# Patient Record
Sex: Female | Born: 1940 | Race: Black or African American | Hispanic: No | Marital: Married | Smoking: Former smoker
Health system: Southern US, Community
[De-identification: ages and names within clinical notes are randomized; demographics above are authoritative.]

## PROBLEM LIST (undated history)

## (undated) DIAGNOSIS — Z9889 Other specified postprocedural states: Secondary | ICD-10-CM

## (undated) DIAGNOSIS — I519 Heart disease, unspecified: Secondary | ICD-10-CM

## (undated) DIAGNOSIS — I2510 Atherosclerotic heart disease of native coronary artery without angina pectoris: Secondary | ICD-10-CM

## (undated) DIAGNOSIS — M545 Low back pain, unspecified: Secondary | ICD-10-CM

## (undated) DIAGNOSIS — J189 Pneumonia, unspecified organism: Secondary | ICD-10-CM

## (undated) DIAGNOSIS — E7800 Pure hypercholesterolemia, unspecified: Secondary | ICD-10-CM

## (undated) DIAGNOSIS — I10 Essential (primary) hypertension: Secondary | ICD-10-CM

## (undated) DIAGNOSIS — E119 Type 2 diabetes mellitus without complications: Secondary | ICD-10-CM

## (undated) DIAGNOSIS — G709 Myoneural disorder, unspecified: Secondary | ICD-10-CM

## (undated) DIAGNOSIS — R112 Nausea with vomiting, unspecified: Secondary | ICD-10-CM

## (undated) DIAGNOSIS — G8929 Other chronic pain: Secondary | ICD-10-CM

## (undated) DIAGNOSIS — Z8489 Family history of other specified conditions: Secondary | ICD-10-CM

## (undated) DIAGNOSIS — K219 Gastro-esophageal reflux disease without esophagitis: Secondary | ICD-10-CM

## (undated) DIAGNOSIS — M1990 Unspecified osteoarthritis, unspecified site: Secondary | ICD-10-CM

## (undated) DIAGNOSIS — E785 Hyperlipidemia, unspecified: Secondary | ICD-10-CM

## (undated) DIAGNOSIS — I959 Hypotension, unspecified: Secondary | ICD-10-CM

## (undated) DIAGNOSIS — J302 Other seasonal allergic rhinitis: Secondary | ICD-10-CM

## (undated) DIAGNOSIS — R55 Syncope and collapse: Secondary | ICD-10-CM

## (undated) HISTORY — PX: HYSTERECTOMY: SHX81

## (undated) HISTORY — PX: CORONARY ARTERY BYPASS: SHX3487

## (undated) HISTORY — DX: Hyperlipidemia, unspecified: E78.5

## (undated) HISTORY — DX: Essential (primary) hypertension: I10

## (undated) HISTORY — DX: Gastro-esophageal reflux disease without esophagitis: K21.9

## (undated) HISTORY — PX: BREAST BIOPSY: SHX20

## (undated) HISTORY — DX: Type 2 diabetes mellitus without complications: E11.9

## (undated) HISTORY — DX: Other seasonal allergic rhinitis: J30.2

## (undated) HISTORY — PX: APPENDECTOMY: SHX54

## (undated) HISTORY — PX: JOINT REPLACEMENT: SHX530

## (undated) HISTORY — DX: Heart disease, unspecified: I51.9

## (undated) HISTORY — PX: SHOULDER OPEN ROTATOR CUFF REPAIR: SHX2407

## (undated) HISTORY — PX: CHOLECYSTECTOMY: SHX55

## (undated) HISTORY — PX: TONSILLECTOMY: SUR1361

## (undated) HISTORY — PX: DILATION AND CURETTAGE OF UTERUS: SHX78

## (undated) HISTORY — PX: BACK SURGERY: SHX140

## (undated) HISTORY — PX: VAGINAL HYSTERECTOMY: SUR661

## (undated) HISTORY — PX: CATARACT EXTRACTION W/ INTRAOCULAR LENS  IMPLANT, BILATERAL: SHX1307

---

## 1995-08-23 ENCOUNTER — Inpatient Hospital Stay
Admission: AD | Admit: 1995-08-23 | Disposition: A | Discharge status: Home / Self Care | Payer: Self-pay | Source: Ambulatory Visit | Admitting: Internal Medicine

## 2000-05-02 HISTORY — PX: CARDIAC CATHETERIZATION: SHX172

## 2000-05-02 HISTORY — PX: CORONARY ARTERY BYPASS GRAFT: SHX141

## 2001-02-19 ENCOUNTER — Inpatient Hospital Stay
Admission: EM | Admit: 2001-02-19 | Disposition: A | Discharge status: Home / Self Care | Payer: Self-pay | Source: Emergency Department | Admitting: Internal Medicine

## 2001-08-24 ENCOUNTER — Inpatient Hospital Stay
Admission: EM | Admit: 2001-08-24 | Disposition: A | Discharge status: Home / Self Care | Payer: Self-pay | Source: Emergency Department | Admitting: Internal Medicine

## 2001-08-24 ENCOUNTER — Ambulatory Visit
Admit: 2001-08-24 | Disposition: A | Discharge status: Home / Self Care | Payer: Self-pay | Source: Ambulatory Visit | Admitting: Internal Medicine

## 2001-09-14 ENCOUNTER — Ambulatory Visit: Admit: 2001-09-14 | Disposition: A | Discharge status: Home / Self Care | Payer: Self-pay | Source: Ambulatory Visit

## 2001-11-20 ENCOUNTER — Ambulatory Visit: Admit: 2001-11-20 | Disposition: A | Discharge status: Home / Self Care | Payer: Self-pay | Source: Ambulatory Visit

## 2004-05-02 HISTORY — PX: CORONARY ANGIOPLASTY WITH STENT PLACEMENT: SHX49

## 2012-04-23 NOTE — Discharge Summary (Unsigned)
Vanessa Kelly, Vanessa Kelly                 #[id number]            ADMITTED:  08/24/2001               DISCHARGED:  09/01/2001            ATTENDING PHYSICIAN:                Rocco Pauls, MD            DICTATED BY:                        Lovell Sheehan, NP            PROCEDURE:  On 08/28/2001, coronary artery bypass grafting times three with      left internal mammary artery to the  LAD,  saphenous  vein  graft to ramus,      saphenous vein graft to right coronary artery.            HISTORY OF PRESENT ILLNESS:  Vanessa Kelly  is  a  71 year old  female with a      past medical history significant for coronary artery disease, hypertension,      hypercholesterolemia, and non-insulin  dependent diabetes who had developed      exertional  chest burning for a period  of  about  three  weeks.   She  was      admitted to the emergency room with unstable angina and underwent a cardiac      catheterization which revealed severe  stenotic lesion in the midportion of      a very large right coronary artery with a severe lesion in the mid-left LAD      as well.  Left ventricular function was well preserved and she was referred      for surgical revascularization.            HOSPITAL  COURSE:   On 08/24/2001 Mrs.  Kelly  underwent  coronary  artery      bypass grafting.  She tolerated the procedure  well  and was transferred to      the cardiovascular ICU on nitroglycerin  and  low-dose  dopamine infusions.      The  following  day  she  was  extubated,  off  vasoactive  infusions,  and      transferred to the telemetry floor.   She  remained  in  sinus  rhythm with      stable  vital  signs.   Her  hematocrit   was  stable  at  25.7.   She  was      asymptomatic with activity and was Vanessa Kelly  transfused.   Her  white blood cell      count was 11.3 and platelets were 160,000.   Her  postoperative  x-ray  was      clear.  Chest tubes and pacer wires  were  discontinued  without  incident.      She did require Toradol for pain associated  with chest tubes; however, once      removed her pain was well controlled.   She  was ambulating in the hall and      tolerating  a  cardiac  diet  and  discharged   to   home   on  the  fourth      postoperative day with instructions  to  follow  up  in the cardiac surgery      clinic  in  two weeks.  She was also  instructed  to  follow  up  with  Dr.      Lorel Monaco in two to three weeks.            DISCHARGE MEDICATIONS:  Aspirin 325 mg  p.o.  daily, amiodarone 200 mg p.o.      daily times 10 days, Glucophage 500 mg p.o. b.i.d., Maxzide one-half tablet      daily, Darvocet-N 100 one to two tablets q.4 h. p.r.n., and Lopressor 25 mg      p.o. b.i.d.            Patient  education  was  begun on post-op  day  three  in  accordance  with      standards set by the CVT service and continued daily until discharge.  This      education   included  input  from  dietary   and   cardiac   rehabilitation      consultants.  A list of instructions  was  given to the patient at the time      of  discharge  which  emphasized permissible  activities,  diet,  discharge      medications and emergency contact methods.  The patient was judged by me to      be stable at time of discharge.  A copy  of  these instructions is detailed      in the patient's hospital record.                  DISCHARGE DIAGNOSES      1.   Coronary artery disease status post coronary artery bypass grafting.      2.   Hypertension.      3.   Hypercholesterolemia.      4.   Non-insulin dependent diabetes.                                                                  ______________________________________                              __________                              Rocco Pauls, MD                              Date            [id number]      D: 09/01/2001  4:58 P      T: 09/04/2001  4:23 P      J: [id number]      N: [id number]      CC: Rocco Pauls, MD         Ancil Boozer, MD

## 2012-04-23 NOTE — Op Note (Unsigned)
DATE OF CATHETERIZATION:            08/24/2001            CARDIOLOGIST:                       Ancil Boozer, MD            CATHETERIZATION NUMBER:            PRECATHETERIZATION DIAGNOSIS:            POSTCATHETERIZATION DIAGNOSIS:            TITLE OF PROCEDURE      1.   LEFT HEART CATHETERIZATION.      2.   CINE LEFT VENTRICULOGRAPHY.      3.   SELECTIVE CORONARY ARTERIOGRAPHY.            INDICATIONS:  The patient is a 71 year old  diabetic female with a 2-3 week      history of class III exertional angina.  An outpatient exercise stress test      performed earlier today was notable for reproduction of chest pain at a low      work load along with ST elevation in  the  inferior  leads,  both  of which      resolved with sublingual nitroglycerin.  She is now brought to the hospital      for catheterization and possible intervention.            DESCRIPTION  OF  PROCEDURE:   The  cardiac   catheterization  and  possible      coronary interventional procedure were  reviewed  with  the patient and her      husband prior to the procedure with  special  attention  to  the  risks  of      death, MI, CVA, bleeding, and the like.   The patient understood and agreed      to proceed as planned.            At this point the patient was brought in the lab where she was premedicated      with 2 mg of IV Versed and 50 mcg of  IV  fentanyl.   The  patient's  right      groin  was  prepped  and  draped  in  the   usual  sterile  manner.   After      infiltration  of this area with Xylocaine  the  right  femoral  artery  was      cannulated  into  which was placed a  6-French  sheath.   A  6-French  left      Judkins 4-inch catheter was then introduced through the sheath and advanced      under  fluoroscopic  guidance  into  the   left  coronary  where  selective      angiography of the left coronary was  performed  using hand-held injections      of contrast.  This catheter was then  removed  and exchanged for a 6-French      3-D  right coronary catheter which was used in like fashion to cannulate the      right coronary and selective angiography  performed  in  a  similar manner.      This catheter was then removed and exchanged  for a  6-French angled pigtail      catheter which was introduced into the  left  ventricle.   Left ventricular      pressure recordings were taken followed  by a cine left ventriculogram done      in the RAO position using contrast infused  with  a  power  injector.  Left      heart pullback was then done.            This catheter was then removed from the patient, the sheath was pulled, and      hemostasis  achieved  in  the  right groin  using  VasoSeal.   The  patient      tolerated the procedure well and no complications were noted.            RESULTS            1.   FLUOROSCOPY:  No significant calcifications were noted.            2.   SELECTIVE CORONARY ARTERIOGRAPHY           a.   Left Main - The left main coronary artery is normal.           b.   Left Anterior Descending - The left anterior descending artery is      a medium size vessel which is noted to  terminate at the apex of the heart.      This vessel is noted to give off a significant reasonably focal stenosis in      its proximal one-third, with this lesion  occurring  at  the takeoff of the      second major septal perforator branch.   The  lesion  appears approximately      80% in severity.  The remainder of the  LAD  is free of significant disease      as is a major diagonal branch which emanates just prior to the lesion.           c.   Left Circumflex - The left  circumflex  artery  is  a nondominant      vessel comprised of one major A-V groove  branch,  which  gives off several      modest marginals and then terminates into  one major marginal vessel.  This      system appears free of significant disease.           d.   Ramus Intermedius - A ramus intermedius branch of medium to large      is noted, which has the suggestion of significant  ostial narrowing.           e.   Right Coronary Artery - The  right  coronary  artery  is  a large      caliber dominant vessel.  It is noted  to  harbor a focal 99% lesion in its      proximal  to mid portion.  The vessel  terminates  distally  into  a  large      normal PDA and posterior LV branch.            3.    LEFT  VENTRICULOGRAPHY:   Left   ventricular   size  appears  normal.      Systolic  function  is  well  maintained   with  no  regional  wall  motion      abnormalities noted.  The calculated global ejection fraction was normal at      67%.  No mitral regurgitation was appreciated.   The  proximal  aortic root      appears of relatively normal caliber.   There  is the suggestion of minimal      dilatation of the proximal root.            4.   HEMODYNAMICS:  This data is noted  on  a  separate sheet.  Of note was      the lack of any significant gradient across the aortic valve.            CONCLUSIONS      1.    Three-vessel  coronary  artery   disease  with  significant  stenosis      involving the proximal LAD, what appears  to be a significant ostial lesion      involving a medium size ramus vessel, and a critical stenosis involving the      large dominant right coronary artery.      2.   Normal left ventricular systolic function.            DISCUSSION:  Vanessa Kelly is a 71 year old  diabetic  female  who  presented      with a 2-3 week history of class III  angina  confirmed  by a very abnormal      exercise stress test showing inferior  ST elevation and chest pain at a low      work load.  Catheterization today disclosed a critical lesion involving the      large dominant right coronary artery,  but  the  patient  appeared  to have      incidental  although  significant  lesions  involving  her  LAD  and  ramus      intermedius branch.  Given the multivessel  nature  of  her  disease in the      setting  of  a  diabetic patient, it  would  appear  that  full  mechanical      revascularization with  coronary bypass  surgery would be her best long term      solution.  The option of multivessel intervention  to the right and LAD was      mentioned  to the patient, but it was  mentioned  that  statistically  this      would  put  her  at somewhat increased  risk  for  early  recurrent  events      necessitating repeat intervention in  the  near  term  as opposed to bypass      surgery.  She, therefore, opted for the latter approach.                  The patient will be admitted to the hospital  and in the interim stabilized      on medical therapy to include aspirin,  Lovenox, nitrates, beta blocker and      ACE inhibitor therapy.  _____________________________________                              _________                              Ancil Boozer, MD                              Date            [id number]      D: 08/24/2001  4:53 P      T: 08/26/2001  4:53 P      J: [id number]      N: [id number]      CC: Ancil Boozer, MD      Dr. Ivan Anchors M.D., [facility], [address],   [place], [place] [address]        [facility] , [facility]

## 2012-04-23 NOTE — Op Note (Unsigned)
DATE OF SURGERY:                    08/28/2001            SURGEON:                            Rocco Pauls, MD            ASSISTANT(S):                       Damita Lack, PA            DICTATED BY:                        Rocco Pauls, MD                  PREOPERATIVE  DIAGNOSIS:UNSTABLE  ANGINA   PECTORIS   AND  CORONARY  ARTERY                                          DISEASE.            POSTOPERATIVE DIAGNOSIS:            UNSTABLE  ANGINA  PECTORIS AND CORONARY                                          ARTERY DISEASE.            PROCEDURE PERFORMED:                 CORONARY  REVASCULARIZATION  TO  THREE                                          VESSELS USING THE LEFT INTERNAL MAMMARY                                          ARTERY TO THE LEFT ANTERIOR  DESCENDING                                          AND REVERSE SAPHENOUS  VEIN  GRAFTS  TO                                          THE RAMUS INTERMEDIUS AND TO THE DISTAL                                          RIGHT CORONARY ARTERY.            INDICATIONS AND FINDINGS:This 71 year old  lady  presented  with  a  recent      rapidly progressing angina pectoris and a  strongly positive treadmill test.      She  was  admitted through the emergency  room  with  unstable  angina  and      underwent a cardiac catheterization  on  August 24, 2001  by  Dr. Lorel Monaco      demonstrating a severe stenotic lesion  in  the mid portion of a very large      right coronary artery.  She also had  a  severe  lesion  in  the  mid  left      anterior descending beyond a moderate  sized  diagonal  and  a  significant      stenosis in the origin of the ramus intermedius.   The  circumflex was free      of  significant  disease.   The  left ventricular  contractility  was  well      preserved.  Surgical revascularization  was recommended.  The patient had a      history of noninsulin dependent diabetes and hypercholesterolemia.            At the time of exploration, the heart   was moderately hypertrophied but had      good  systolic  function with no evidence  of  previous  scars.   The  left      anterior descending was 2.5 mm in diameter.   The  ramus  was  2 mm and the      distal right coronary artery was 3.5  mm  in  diameter.   The  rest  of the      exploration was unremarkable.            DESCRIPTION OF PROCEDURE:           The procedure was performed under total      cardiopulmonary  bypass with mild hypothermia  to  32  degrees  centigrade.      Myocardial   preservation   was   by  cold   blood   cardioplegia   infused      intermittently  in  an antegrade and  retrograde  fashion  in  addition  to      topical hypothermia.            The  patient  was  in  the  supine  position   under  general  endotracheal      anesthesia.  The chest and groins and  legs  were  prepped  and draped in a      sterile manner.  A midline sternotomy was performed and the mediastinum was      opened.  The left internal mammary artery  was dissected out throughout its      length using the electrocautery and  microclips.   A  segment  of saphenous      vein was harvested from the left leg  to  be  used  for  the bypasses.  The      patient  was  fully  heparinized.   Arterial  and  venous  cannulation  was      accomplished   to  the  ascending  aorta   and   the   right   atrium   and      cardiopulmonary  bypass  was instituted.   The  patient's  temperature  was      brought down to 32 degrees centigrade.   The  aorta  was  cross-clamped and      cardioplegia was infused.  The first  vein  graft  was  anastomosed  to the      distal right coronary artery as an end-to-side anastomosis with running 7-0      Prolene.  The second saphenous vein  graft  was  anastomosed  to  the ramus      intermedius as an end-to-side anastomosis  with  running  7-0 Prolene.  The      left internal mammary artery was then anastomosed to the mid portion of the      left anterior descending as an end-to-side   anastomosis  with  running  8-0      Prolene.  Rewarming was begun.  A dose  of warm blood cardioplegia enriched      with aspartate and glutamate was infused  followed by release of the aortic      cross-clamp and reperfusion of the grafts from the pump.  Cardioversion was      completed without difficulty.  Air was  carefully  evacuated  from the left      side  of  the  heart  and  then,  as  rewarming  progressed,  the  proximal      anastomoses were performed to the side  of  the  ascending  aorta  using  a      partial  occlusion clamp and a running  stitch  of  5-0  Prolene  for  each      anastomosis.  When rewarming was completed,  cardiac contractility appeared      satisfactory.  The patient was weaned off of cardiopulmonary bypass without      difficulty.  The venous and arterial  lines  were  removed  and the heparin      effect reversed using protamine intravenously.   Two  right  atrial and two      right ventricular pacing wires were placed  on  for  temporary pacing.  Two      anterior mediastinal tubes and one left  pleural  tube  were  placed in for      drainage.  The hemostasis was completed  to  a  satisfactory  degree.   The      sponge and needle counts were correct.   The  chest was closed in a routine      fashion using #5 wires on the sternum,  #0  Vicryl  on  the  linea alba and      presternal layers and 4-0 Vicryl on the  subcuticular  layer.   The patient      tolerated the procedure well and was transferred to the Intensive Care Unit      in satisfactory condition.                                                __________________________________________                              __________  Rocco Pauls, MD                              Date            BFA/kg      D: 08/28/2001  3:55 P      T: 08/29/2001  7:08 A      J: [id number]      N: [id number]      CC: Rocco Pauls, MD         Ancil Boozer, MD

## 2012-05-02 HISTORY — PX: TOTAL KNEE ARTHROPLASTY: SHX125

## 2015-10-12 ENCOUNTER — Ambulatory Visit (INDEPENDENT_AMBULATORY_CARE_PROVIDER_SITE_OTHER): Payer: Medicare Other | Admitting: Neurology

## 2015-10-12 ENCOUNTER — Encounter: Payer: Self-pay | Admitting: Neurology

## 2015-10-12 VITALS — BP 142/70 | HR 72 | Resp 16 | Ht 69.0 in | Wt 185.0 lb

## 2015-10-12 DIAGNOSIS — G250 Essential tremor: Secondary | ICD-10-CM | POA: Diagnosis not present

## 2015-10-12 DIAGNOSIS — R296 Repeated falls: Secondary | ICD-10-CM

## 2015-10-12 DIAGNOSIS — R29818 Other symptoms and signs involving the nervous system: Secondary | ICD-10-CM

## 2015-10-12 DIAGNOSIS — R2689 Other abnormalities of gait and mobility: Secondary | ICD-10-CM

## 2015-10-12 DIAGNOSIS — E0842 Diabetes mellitus due to underlying condition with diabetic polyneuropathy: Secondary | ICD-10-CM

## 2015-10-12 NOTE — Progress Notes (Signed)
Subjective:    Patient ID: Cynthia Wood is a 75 y.o. female.  HPI      Dear Dr. Ollen BowlHarkins,  I saw your patient, Cynthia Wood, upon your kind request in my neurologic clinic today for initial consultation of her balance issues with an underlying history of lumbar radiculopathy. The patient is accompanied by her husband today. As you know, Cynthia Wood is a 75 year old right-handed woman with an underlying medical history of tremor, allergies, DM, HTN, reflux, 3 vessel CABG in 2001, who reports low back pain. I reviewed your office note from 09/17/2015. In April 2017 she underwent L4-5 epidural steroid injection. She reported improvement for a few days. However, she had a fall as she tripped on a curb.  She had another epidural steroid injection. She had a prior lumbar spine MRI. ResultsWere requested from your office and I reviewed the test results: Exam date 08/13/2015: Impression: At L5-S1 there is a central disc protrusion. At L4-5 there is a mild broad-based disc bulge with posterior annular fissure and mild bilateral facet arthropathy. At L3-4 there is a mild broad-based disc bulge with a left lateral annular fissure. Bilateral uncovertebral degenerative changes. Mild right and severe left foraminal stenosis. Moderate central canal stenosis. Given the atypical appearance of follow-up MRI in 6 months is recommended. She reports recurrent falls, some with bumping her head. She started falling in 2013, and fell on her face, she went to the dentist due to a chipped tooth from the fall, but not to the ER.  She is currently on gabapentin 100 mg bid (per podiatrist), hydrocodone is as needed, but not frequently. She uses Lidoderm patches about 2-3 times per week.  She has a nephew with Parkinson's, no other tremor related FHx. She has had an intermittent head tremor for about 18 mo, has become worse.  She snores very little, no apneas, has been on low dose Ambien for about 15-16 years, since her  open heart surgery. She does not use a cane or a walker. She does not report any lightheadedness or vertigo with standing or changes in position. She tries to stay well-hydrated. She does not always eat well enough. She lives with her husband. She drives. She has not had any issues driving.   Her Past Medical History Is Significant For: Past Medical History  Diagnosis Date  . Hypertension   . Heart disease     Her Past Surgical History Is Significant For: Past Surgical History  Procedure Laterality Date  . Coronary angioplasty with stent placement    . Coronary artery bypass graft    . Total knee arthroplasty  2014  . Abdominal hysterectomy      Her Family History Is Significant For: Family History  Problem Relation Age of Onset  . Breast cancer Sister   . Heart failure Mother     Her Social History Is Significant For: Social History   Social History  . Marital Status: Married    Spouse Name: N/A  . Number of Children: N/A  . Years of Education: college   Social History Main Topics  . Smoking status: Former Games developermoker  . Smokeless tobacco: None     Comment: Quit 1975  . Alcohol Use: No  . Drug Use: No  . Sexual Activity: Not Asked   Other Topics Concern  . None   Social History Narrative   Denies caffeine use     Her Allergies Are:  Allergies  Allergen Reactions  . Oxycodone Nausea And Vomiting and  Other (See Comments)    Caused me to be a little crazy  . Propofol Itching  . Fentanyl   :   Her Current Medications Are:  Outpatient Encounter Prescriptions as of 10/12/2015  Medication Sig  . acetaminophen (TYLENOL) 500 MG tablet Take 500 mg by mouth every 4 (four) hours as needed.  [name] aspirin EC 81 MG tablet Take by mouth.  . Biotin (SUPER BIOTIN) 5 MG TABS Take by mouth.  [name] buPROPion (WELLBUTRIN XL) 300 MG 24 hr tablet   . furosemide (LASIX) 40 MG tablet Take by mouth.  . gabapentin (NEURONTIN) 100 MG capsule   . HYDROcodone-acetaminophen (LORTAB) 10-500  MG tablet Take 1-2 tablets by mouth every 6 (six) hours as needed for pain.  . isosorbide mononitrate (IMDUR) 30 MG 24 hr tablet Take by mouth.  . lidocaine (LIDODERM) 5 % Place onto the skin.  [name] loratadine (CLARITIN) 10 MG tablet Take by mouth.  . lovastatin (MEVACOR) 40 MG tablet Take by mouth.  . metFORMIN (GLUCOPHAGE) 500 MG tablet Take by mouth.  . metoprolol tartrate (LOPRESSOR) 25 MG tablet   . Multiple Vitamins-Minerals (CENTRUM SILVER) tablet Take by mouth.  . ranitidine (ZANTAC) 300 MG tablet Take by mouth.  . zolpidem (AMBIEN) 5 MG tablet Take by mouth.   No facility-administered encounter medications on file as of 10/12/2015.  :  Review of Systems:  Out of a complete 14 point review of systems, all are reviewed and negative with the exception of these symptoms as listed below:   Review of Systems  Neurological:       Patient had a fall 11/03/2013, since then she has had back pain and balance issues. She reports many falls after this, each time she has hit her head or her face.  Patient just had lower back ESI.     Objective:  Neurologic Exam  Physical Exam Physical Examination:   Filed Vitals:   10/12/15 1542  BP: 142/70  Pulse: 72  Resp: 16    General Examination: The patient is a very pleasant 75 y.o. female in no acute distress. She appears well-developed and well-nourished and well groomed.   HEENT: Normocephalic, atraumatic, pupils are equal, round and reactive to light and accommodation. Funduscopic exam is normal with sharp disc margins noted. She is status post bilateral cataract extractions. Extraocular tracking is good without limitation to gaze excursion or nystagmus noted.  hearing is intact. Face is symmetric with normal facial animation and normal facial sensation. Speech is clear with no dysarthria noted. There is no hypophonia. There is no lip, or voice tremor, but she has a mild intermittent side-to-side head tremor. Neck is supple with full range of  passive and active motion. There are no carotid bruits on auscultation. Oropharynx exam reveals: moderate mouth dryness, adequate dental hygiene and no significant airway crowding. Tonsils are absent. Mallampati is class I. Tongue protrudes centrally and palate elevates symmetrically.   Chest: Clear to auscultation without wheezing, rhonchi or crackles noted.  Heart: S1+S2+0, regular and normal without murmurs, rubs or gallops noted.   Abdomen: Soft, non-tender and non-distended with normal bowel sounds appreciated on auscultation.  Extremities: There is trace pitting edema in the distal lower extremities bilaterally. Pedal pulses are intact.  Skin: Warm and dry without trophic changes noted. There are no varicose veins.  Musculoskeletal: exam reveals no obvious joint deformities, tenderness or joint swelling or erythema.   Neurologically:  Mental status: The patient is awake, alert and oriented in all 4 spheres.  Her immediate and remote memory, attention, language skills and fund of knowledge are appropriate. There is no evidence of aphasia, agnosia, apraxia or anomia. Speech is clear with normal prosody and enunciation. Thought process is linear. Mood is normal and affect is normal.  Cranial nerves II - XII are as described above under HEENT exam. In addition: shoulder shrug is normal with equal shoulder height noted. Motor exam: Normal bulk, strength and tone is noted. There is no drift, tremor or rebound. Romberg is negative. Reflexes are 1+ throughout, trace in the ankles. Babinski: Toes are flexor bilaterally. Fine motor skills and coordination: intact with normal finger taps, normal hand movements, normal rapid alternating patting, normal foot taps and normal foot agility.  Cerebellar testing: No dysmetria or intention tremor on finger to nose testing. Heel to shin is unremarkable bilaterally. There is no truncal or gait ataxia.  Sensory exam: intact to light touch, pinprick, vibration,  temperature sense in the upper and lower extremities, with the exception of decreased PP and temperature sensation in the feet.  Gait, station and balance: She stands with mild difficulty. No veering to one side is noted. No leaning to one side is noted. Posture is age-appropriate and stance is narrow based. Gait shows mildly stooped posture. She walks slightly slowly and cautiously. She has slight difficulty turning. Tandem walk is not possible.  Assessment and Plan:    In summary, Cynthia Wood is a very pleasant 75 y.o.-year old female with an underlying medical history of tremor, allergies, DM, HTN, reflux, 3 vessel CABG in 2001, who  reports recurrent falls since 2013. On examination, she has a gait disorder, she has no specific movement disorder with the exception of the head tremor noted. She has had this for at least a year to 18 months per husband. It is an intermittent head tremor. She has a family history of Parkinson's disease but herself has no overt parkinsonism. 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 She is advised to change positions slowly, start using a rolling walker which I will prescribe and stay well-hydrated. I will request physical therapy, she requested physical therapy locally in [place]. Furthermore, I suggested a brain MRI without contrast and we will call her with her test results, we can do this in [place] as well. I will see her back routinely in a couple of months, sooner as needed. I answered all their questions today and the patient and her husband were in agreement.  Thank you very much for allowing me to participate in the care of this nice patient. If I can be of any further assistance to you  please do not hesitate to call me at [phone].  Sincerely,   Huston Foley, MD, PhD

## 2015-10-12 NOTE — Patient Instructions (Addendum)
We will do a brain scan, called MRI and call you with the test results. We will have to schedule you for this on a separate date. This test requires authorization from your insurance, and we will take care of the insurance process. We will request PT, and I will prescribe a rolling walker. Please start using your walker.  Please stay well hydrated.  Your balance issue may be due to a combination of things: aging, back issues, neuropathy (nerve damage from diabetes), medication effect (even from the [place] dose [place]).  Exercise will help too.

## 2015-10-22 ENCOUNTER — Ambulatory Visit
Admission: RE | Admit: 2015-10-22 | Discharge: 2015-10-22 | Disposition: A | Discharge status: Home / Self Care | Payer: Medicare Other | Source: Ambulatory Visit | Attending: Neurology | Admitting: Neurology

## 2015-10-22 DIAGNOSIS — R2689 Other abnormalities of gait and mobility: Secondary | ICD-10-CM

## 2015-10-22 DIAGNOSIS — R29818 Other symptoms and signs involving the nervous system: Secondary | ICD-10-CM | POA: Diagnosis not present

## 2015-10-22 DIAGNOSIS — R296 Repeated falls: Secondary | ICD-10-CM | POA: Diagnosis not present

## 2015-10-27 ENCOUNTER — Telehealth: Payer: Self-pay | Admitting: Neurology

## 2015-10-27 ENCOUNTER — Telehealth: Payer: Self-pay

## 2015-10-27 NOTE — Telephone Encounter (Signed)
-----   Message from Huston FoleySaima Athar, MD sent at 10/27/2015  9:08 AM EDT ----- Please call patient regarding the recent brain MRI: The brain scan showed a normal structure of the brain and no significant volume loss which we call atrophy. There were changes in the deeper structures of the brain, which we call white matter changes or microvascular changes. These were reported as minimal in her case. These are tiny white spots, that occur with time and are seen in a variety of conditions, including with normal aging, chronic hypertension, chronic headaches, especially migraine HAs, chronic diabetes, chronic hyperlipidemia. These are not strokes and no mass or lesion were seen which is reassuring. There may be a sinus infection in the R maxillary sinus, if she has symptoms, she should see PCP, otherwise, there were no acute findings, such as a stroke, or mass or blood products. No further action is required on this test at this time, other than re-enforcing the importance of good blood pressure control, good cholesterol control, good blood sugar control, and weight management. Please remind patient to keep any upcoming appointments or tests and to call us with any interim questions, concerns, problems or updates. Thanks,  Huston FoleySaima Athar, MD, PhD

## 2015-10-27 NOTE — Progress Notes (Signed)
Quick Note:  Please call patient regarding the recent brain MRI: The brain scan showed a normal structure of the brain and no significant volume loss which we call atrophy. There were changes in the deeper structures of the brain, which we call white matter changes or microvascular changes. These were reported as minimal in her case. These are tiny white spots, that occur with time and are seen in a variety of conditions, including with normal aging, chronic hypertension, chronic headaches, especially migraine HAs, chronic diabetes, chronic hyperlipidemia. These are not strokes and no mass or lesion were seen which is reassuring. There may be a sinus infection in the R maxillary sinus, if she has symptoms, she should see PCP, otherwise, there were no acute findings, such as a stroke, or mass or blood products. No further action is required on this test at this time, other than re-enforcing the importance of good blood pressure control, good cholesterol control, good blood sugar control, and weight management. Please remind patient to keep any upcoming appointments or tests and to call us with any interim questions, concerns, problems or updates. Thanks,  Huston FoleySaima Tyhir Schwan, MD, PhD    ______

## 2015-10-27 NOTE — Telephone Encounter (Signed)
Patient returned a call for MRI results please call back thanks dg

## 2015-10-27 NOTE — Telephone Encounter (Signed)
LM for patient to call back.

## 2015-10-28 NOTE — Telephone Encounter (Signed)
Patient called back, I gave results and recommendations. She voiced understanding

## 2015-10-28 NOTE — Telephone Encounter (Signed)
CAlled home number, patient was not home. LM on cell to call back

## 2015-12-24 ENCOUNTER — Other Ambulatory Visit: Payer: Self-pay | Admitting: Neurosurgery

## 2015-12-24 DIAGNOSIS — M5416 Radiculopathy, lumbar region: Secondary | ICD-10-CM

## 2016-01-06 ENCOUNTER — Other Ambulatory Visit: Payer: Medicare Other

## 2016-01-13 ENCOUNTER — Ambulatory Visit
Admission: RE | Admit: 2016-01-13 | Discharge: 2016-01-13 | Disposition: A | Discharge status: Home / Self Care | Payer: Medicare Other | Source: Ambulatory Visit | Attending: Neurosurgery | Admitting: Neurosurgery

## 2016-01-13 DIAGNOSIS — M5416 Radiculopathy, lumbar region: Secondary | ICD-10-CM

## 2016-01-13 MED ORDER — DIAZEPAM 5 MG PO TABS
5.0000 mg | ORAL_TABLET | Freq: Once | ORAL | Status: AC
  Administered 2016-01-13: 5 mg via ORAL

## 2016-01-13 MED ORDER — IOPAMIDOL (ISOVUE-M 200) INJECTION 41%
15.0000 mL | Freq: Once | INTRAMUSCULAR | Status: AC
  Administered 2016-01-13: 15 mL via INTRATHECAL

## 2016-01-13 NOTE — Discharge Instructions (Signed)
Myelogram Discharge Instructions  1. Go home and rest quietly for the next 24 hours.  It is important to lie flat for the next 24 hours.  Get up only to go to the restroom.  You may lie in the bed or on a couch on your back, your stomach, your left side or your right side.  You may have one pillow under your head.  You may have pillows between your knees while you are on your side or under your knees while you are on your back.  2. DO NOT drive today.  Recline the seat as far back as it will go, while still wearing your seat belt, on the way home.  3. You may get up to go to the bathroom as needed.  You may sit up for 10 minutes to eat.  You may resume your normal diet and medications unless otherwise indicated.  Drink lots of extra fluids today and tomorrow.  4. The incidence of headache, nausea, or vomiting is about 5% (one in 20 patients).  If you develop a headache, lie flat and drink plenty of fluids until the headache goes away.  Caffeinated beverages may be helpful.  If you develop severe nausea and vomiting or a headache that does not go away with flat bed rest, call [phone].  5. You may resume normal activities after your 24 hours of bed rest is over; however, do not exert yourself strongly or do any heavy lifting tomorrow. If when you get up you have a headache when standing, go back to bed and force fluids for another 24 hours.  6. Call your physician for a follow-up appointment.  The results of your myelogram will be sent directly to your physician by the following day.  7. If you have any questions or if complications develop after you arrive home, please call [phone].  Discharge instructions have been explained to the patient.  The patient, or the person responsible for the patient, fully understands these instructions.       MAY RESUME WELLBUTRIN ON SEPT. 14, 2017, AFTER 1:00 PM.

## 2016-01-13 NOTE — Progress Notes (Signed)
Pt states she has been off Wellbutrin for the past 3 days.

## 2016-01-15 ENCOUNTER — Telehealth: Payer: Self-pay

## 2016-01-15 NOTE — Telephone Encounter (Signed)
I spoke with Mrs. [name] and she says she is doing well after her myelogram with us on 01/13/16.  jkl

## 2016-01-25 ENCOUNTER — Ambulatory Visit: Payer: Medicare Other | Admitting: Neurology

## 2016-01-26 ENCOUNTER — Other Ambulatory Visit: Payer: Self-pay | Admitting: Neurosurgery

## 2016-02-08 ENCOUNTER — Encounter (HOSPITAL_COMMUNITY)
Admission: RE | Admit: 2016-02-08 | Discharge: 2016-02-08 | Disposition: A | Discharge status: Home / Self Care | Payer: Medicare Other | Source: Ambulatory Visit | Attending: Neurosurgery | Admitting: Neurosurgery

## 2016-02-08 ENCOUNTER — Encounter (HOSPITAL_COMMUNITY): Payer: Self-pay

## 2016-02-08 DIAGNOSIS — K219 Gastro-esophageal reflux disease without esophagitis: Secondary | ICD-10-CM | POA: Diagnosis present

## 2016-02-08 DIAGNOSIS — Z7984 Long term (current) use of oral hypoglycemic drugs: Secondary | ICD-10-CM | POA: Diagnosis not present

## 2016-02-08 DIAGNOSIS — I10 Essential (primary) hypertension: Secondary | ICD-10-CM | POA: Diagnosis not present

## 2016-02-08 DIAGNOSIS — M5416 Radiculopathy, lumbar region: Secondary | ICD-10-CM | POA: Diagnosis not present

## 2016-02-08 DIAGNOSIS — Z87891 Personal history of nicotine dependence: Secondary | ICD-10-CM

## 2016-02-08 DIAGNOSIS — E119 Type 2 diabetes mellitus without complications: Secondary | ICD-10-CM | POA: Diagnosis not present

## 2016-02-08 DIAGNOSIS — I2510 Atherosclerotic heart disease of native coronary artery without angina pectoris: Secondary | ICD-10-CM | POA: Diagnosis not present

## 2016-02-08 DIAGNOSIS — Z79899 Other long term (current) drug therapy: Secondary | ICD-10-CM

## 2016-02-08 DIAGNOSIS — Z951 Presence of aortocoronary bypass graft: Secondary | ICD-10-CM | POA: Diagnosis not present

## 2016-02-08 DIAGNOSIS — E785 Hyperlipidemia, unspecified: Secondary | ICD-10-CM | POA: Diagnosis present

## 2016-02-08 DIAGNOSIS — M48061 Spinal stenosis, lumbar region without neurogenic claudication: Secondary | ICD-10-CM | POA: Diagnosis not present

## 2016-02-08 DIAGNOSIS — M545 Low back pain: Secondary | ICD-10-CM | POA: Diagnosis present

## 2016-02-08 HISTORY — DX: Gastro-esophageal reflux disease without esophagitis: K21.9

## 2016-02-08 HISTORY — DX: Unspecified osteoarthritis, unspecified site: M19.90

## 2016-02-08 HISTORY — DX: Myoneural disorder, unspecified: G70.9

## 2016-02-08 HISTORY — DX: Other specified postprocedural states: Z98.890

## 2016-02-08 HISTORY — DX: Nausea with vomiting, unspecified: R11.2

## 2016-02-08 HISTORY — DX: Atherosclerotic heart disease of native coronary artery without angina pectoris: I25.10

## 2016-02-08 LAB — BASIC METABOLIC PANEL
Anion gap: 7 (ref 5–15)
BUN: 19 mg/dL (ref 6–20)
CALCIUM: 10.1 mg/dL (ref 8.9–10.3)
CHLORIDE: 109 mmol/L (ref 101–111)
CO2: 24 mmol/L (ref 22–32)
CREATININE: 1.04 mg/dL — AB (ref 0.44–1.00)
GFR calc non Af Amer: 50 mL/min — ABNORMAL LOW (ref >60)
GFR, EST AFRICAN AMERICAN: 59 mL/min — AB (ref >60)
Glucose, Bld: 110 mg/dL — ABNORMAL HIGH (ref 65–99)
Potassium: 4.4 mmol/L (ref 3.5–5.1)
SODIUM: 140 mmol/L (ref 135–145)

## 2016-02-08 LAB — CBC
HCT: 37.6 % (ref 36.0–46.0)
Hemoglobin: 12.4 g/dL (ref 12.0–15.0)
MCH: 31 pg (ref 26.0–34.0)
MCHC: 33 g/dL (ref 30.0–36.0)
MCV: 94 fL (ref 78.0–100.0)
PLATELETS: 211 10*3/uL (ref 150–400)
RBC: 4 MIL/uL (ref 3.87–5.11)
RDW: 12.9 % (ref 11.5–15.5)
WBC: 7.2 10*3/uL (ref 4.0–10.5)

## 2016-02-08 LAB — GLUCOSE, CAPILLARY: GLUCOSE-CAPILLARY: 106 mg/dL — AB (ref 65–99)

## 2016-02-08 LAB — SURGICAL PCR SCREEN
MRSA, PCR: NEGATIVE
Staphylococcus aureus: NEGATIVE

## 2016-02-08 NOTE — Pre-Procedure Instructions (Signed)
Cynthia Wood  02/08/2016      [facility] [id number] - [place], [place] - [address] [facility] [address] [place] [id number] Phone: [phone] Fax: [phone]   Your procedure is scheduled on 02-10-2016   Wednesday   [facility] Admitting at 8:45 A.M.   Call this number if you have problems the morning of surgery:  [phone]   Remember:  Do not eat food or drink liquids after midnight.   Take these medicines the morning of surgery with A SIP OF WATER Tylenol if needed,bupropion(Wellbutrin),gabapentin)Neurontin),Isosorbied(Imdur),Loratadine(Claritin),Metoprolol(Lopressor),             STOP ASPIRIN,ANTIINFLAMATORIES (IBUPROFEN,ALEVE,MOTRIN,ADVIL,GOODY'S POWDERS),HERBAL SUPPLEMENTS,FISH OIL,AND VITAMINS 5-7 DAYS PRIOR TO SURGERY                   How to Manage Your Diabetes Before and After Surgery  Why is it important to control my blood sugar before and after surgery? . Improving blood sugar levels before and after surgery helps healing and can limit problems. . A way of improving blood sugar control is eating a healthy diet by: o  Eating less sugar and carbohydrates o  Increasing activity/exercise o  Talking with your doctor about reaching your blood sugar goals . High blood sugars (greater than 180 mg/dL) can raise your risk of infections and slow your recovery, so you will need to focus on controlling your diabetes during the weeks before surgery. . Make sure that the doctor who takes care of your diabetes knows about your planned surgery including the date and location.  How do I manage my blood sugar before surgery? . Check your blood sugar at least 4 times a day, starting 2 days before surgery, to make sure that the level is not too high or low. o Check your blood sugar the morning of your surgery when you wake up and every 2 hours until you get to the Short Stay unit. . If  your blood sugar is less than 70 mg/dL, you will need to treat for low blood sugar: o Do not take insulin. o Treat a low blood sugar (less than 70 mg/dL) with  cup of clear juice (cranberry or apple), 4 glucose tablets, OR glucose gel. o Recheck blood sugar in 15 minutes after treatment (to make sure it is greater than 70 mg/dL). If your blood sugar is not greater than 70 mg/dL on recheck, call [phone] for further instructions. . Report your blood sugar to the short stay nurse when you get to Short Stay.  . If you are admitted to the hospital after surgery: o Your blood sugar will be checked by the staff and you will probably be given insulin after surgery (instead of oral diabetes medicines) to make sure you have good blood sugar levels. o The goal for blood sugar control after surgery is 80-180 mg/dL.              WHAT DO I DO ABOUT MY DIABETES MEDICATION?   [name]. Do not take oral diabetes medicines (pills) the morning of surgery    Reviewed and Endorsed by [facility] Patient Education Committee, August 2015   Do not wear jewelry, make-up or nail polish.  Do not wear lotions, powders, or perfumes, or deoderant.  Do not shave 48 hours prior to surgery.    Do not bring valuables to the hospital.  [facility] is not responsible for any belongings or  valuables.  Contacts, dentures or bridgework may not be worn into surgery.  Leave your suitcase in the car.  After surgery it may be brought to your room.  For patients admitted to the hospital, discharge time will be determined by your treatment team.  Patients discharged the day of surgery will not be allowed to drive home.    Special instructions:  See attached Sheet for instructions on CHG showers

## 2016-02-09 LAB — HEMOGLOBIN A1C
HEMOGLOBIN A1C: 7 % — AB (ref 4.8–5.6)
Mean Plasma Glucose: 154 mg/dL

## 2016-02-10 ENCOUNTER — Ambulatory Visit (HOSPITAL_COMMUNITY): Payer: Medicare Other | Admitting: Anesthesiology

## 2016-02-10 ENCOUNTER — Encounter (HOSPITAL_COMMUNITY)
Admission: RE | Disposition: A | Discharge status: Home Health | Payer: Self-pay | Source: Ambulatory Visit | Attending: Neurosurgery

## 2016-02-10 ENCOUNTER — Encounter (HOSPITAL_COMMUNITY): Payer: Self-pay | Admitting: *Deleted

## 2016-02-10 ENCOUNTER — Inpatient Hospital Stay (HOSPITAL_COMMUNITY)
Admission: RE | Admit: 2016-02-10 | Discharge: 2016-02-12 | DRG: 517 | Disposition: A | Discharge status: Home Health | Payer: Medicare Other | Source: Ambulatory Visit | Attending: Neurosurgery | Admitting: Neurosurgery

## 2016-02-10 ENCOUNTER — Ambulatory Visit (HOSPITAL_COMMUNITY): Payer: Medicare Other

## 2016-02-10 DIAGNOSIS — E785 Hyperlipidemia, unspecified: Secondary | ICD-10-CM | POA: Diagnosis not present

## 2016-02-10 DIAGNOSIS — M48061 Spinal stenosis, lumbar region without neurogenic claudication: Secondary | ICD-10-CM | POA: Diagnosis present

## 2016-02-10 DIAGNOSIS — Z7984 Long term (current) use of oral hypoglycemic drugs: Secondary | ICD-10-CM | POA: Diagnosis not present

## 2016-02-10 DIAGNOSIS — Z951 Presence of aortocoronary bypass graft: Secondary | ICD-10-CM | POA: Diagnosis not present

## 2016-02-10 DIAGNOSIS — E119 Type 2 diabetes mellitus without complications: Secondary | ICD-10-CM | POA: Diagnosis not present

## 2016-02-10 DIAGNOSIS — K219 Gastro-esophageal reflux disease without esophagitis: Secondary | ICD-10-CM | POA: Diagnosis not present

## 2016-02-10 DIAGNOSIS — R262 Difficulty in walking, not elsewhere classified: Secondary | ICD-10-CM

## 2016-02-10 DIAGNOSIS — I10 Essential (primary) hypertension: Secondary | ICD-10-CM | POA: Diagnosis not present

## 2016-02-10 DIAGNOSIS — Z87891 Personal history of nicotine dependence: Secondary | ICD-10-CM | POA: Diagnosis not present

## 2016-02-10 DIAGNOSIS — M549 Dorsalgia, unspecified: Secondary | ICD-10-CM

## 2016-02-10 DIAGNOSIS — I2510 Atherosclerotic heart disease of native coronary artery without angina pectoris: Secondary | ICD-10-CM | POA: Diagnosis not present

## 2016-02-10 DIAGNOSIS — Z79899 Other long term (current) drug therapy: Secondary | ICD-10-CM | POA: Diagnosis not present

## 2016-02-10 DIAGNOSIS — Z419 Encounter for procedure for purposes other than remedying health state, unspecified: Secondary | ICD-10-CM

## 2016-02-10 DIAGNOSIS — M545 Low back pain: Secondary | ICD-10-CM | POA: Diagnosis present

## 2016-02-10 DIAGNOSIS — M5416 Radiculopathy, lumbar region: Secondary | ICD-10-CM | POA: Diagnosis not present

## 2016-02-10 HISTORY — DX: Family history of other specified conditions: Z84.89

## 2016-02-10 HISTORY — DX: Pure hypercholesterolemia, unspecified: E78.00

## 2016-02-10 HISTORY — DX: Other chronic pain: G89.29

## 2016-02-10 HISTORY — DX: Low back pain: M54.5

## 2016-02-10 HISTORY — PX: LUMBAR LAMINECTOMY/DECOMPRESSION MICRODISCECTOMY: SHX5026

## 2016-02-10 HISTORY — DX: Type 2 diabetes mellitus without complications: E11.9

## 2016-02-10 HISTORY — DX: Low back pain, unspecified: M54.50

## 2016-02-10 HISTORY — DX: Pneumonia, unspecified organism: J18.9

## 2016-02-10 HISTORY — PX: LUMBAR LAMINECTOMY: SHX95

## 2016-02-10 LAB — GLUCOSE, CAPILLARY
Glucose-Capillary: 141 mg/dL — ABNORMAL HIGH (ref 65–99)
Glucose-Capillary: 144 mg/dL — ABNORMAL HIGH (ref 65–99)
Glucose-Capillary: 216 mg/dL — ABNORMAL HIGH (ref 65–99)

## 2016-02-10 SURGERY — LUMBAR LAMINECTOMY/DECOMPRESSION MICRODISCECTOMY 2 LEVELS
Anesthesia: General | Site: Back | Laterality: Right

## 2016-02-10 MED ORDER — ONDANSETRON HCL 4 MG/2ML IJ SOLN
4.0000 mg | INTRAMUSCULAR | Status: DC | PRN
Start: 2016-02-10 — End: 2016-02-10

## 2016-02-10 MED ORDER — SODIUM CHLORIDE 0.9 % IV SOLN: 250.0000 mL | INTRAVENOUS | Status: DC

## 2016-02-10 MED ORDER — EPHEDRINE 5 MG/ML INJ
INTRAVENOUS | Status: AC
  Filled 2016-02-10: qty 30

## 2016-02-10 MED ORDER — DEXAMETHASONE SODIUM PHOSPHATE 10 MG/ML IJ SOLN
INTRAMUSCULAR | Status: DC | PRN
  Administered 2016-02-10: 5 mg via INTRAVENOUS

## 2016-02-10 MED ORDER — SUFENTANIL CITRATE 50 MCG/ML IV SOLN
INTRAVENOUS | Status: AC
  Filled 2016-02-10: qty 1

## 2016-02-10 MED ORDER — ONDANSETRON HCL 4 MG/2ML IJ SOLN
4.0000 mg | Freq: Once | INTRAMUSCULAR | Status: AC | PRN
  Administered 2016-02-10: 4 mg via INTRAVENOUS

## 2016-02-10 MED ORDER — GLYCOPYRROLATE 0.2 MG/ML IV SOSY
PREFILLED_SYRINGE | INTRAVENOUS | Status: AC
  Filled 2016-02-10: qty 3

## 2016-02-10 MED ORDER — PROPOFOL 10 MG/ML IV BOLUS
INTRAVENOUS | Status: AC
  Filled 2016-02-10: qty 20

## 2016-02-10 MED ORDER — SUFENTANIL CITRATE 50 MCG/ML IV SOLN
INTRAVENOUS | Status: DC | PRN
  Administered 2016-02-10: 5 ug via INTRAVENOUS
  Administered 2016-02-10: 30 ug via INTRAVENOUS

## 2016-02-10 MED ORDER — THROMBIN 5000 UNITS EX SOLR
CUTANEOUS | Status: AC
  Filled 2016-02-10: qty 5000

## 2016-02-10 MED ORDER — HYDROMORPHONE HCL 1 MG/ML IJ SOLN
INTRAMUSCULAR | Status: AC
  Filled 2016-02-10: qty 1

## 2016-02-10 MED ORDER — METFORMIN HCL 500 MG PO TABS
500.0000 mg | ORAL_TABLET | Freq: Every day | ORAL | Status: DC
  Administered 2016-02-11 – 2016-02-12 (×2): 500 mg via ORAL
  Filled 2016-02-10 (×2): qty 1

## 2016-02-10 MED ORDER — CEFAZOLIN SODIUM-DEXTROSE 2-4 GM/100ML-% IV SOLN
2.0000 g | INTRAVENOUS | Status: AC
  Administered 2016-02-10: 2 g via INTRAVENOUS
  Filled 2016-02-10: qty 100

## 2016-02-10 MED ORDER — FUROSEMIDE 40 MG PO TABS
40.0000 mg | ORAL_TABLET | ORAL | Status: DC
  Filled 2016-02-10: qty 1

## 2016-02-10 MED ORDER — INSULIN ASPART 100 UNIT/ML ~~LOC~~ SOLN
0.0000 [IU] | Freq: Every day | SUBCUTANEOUS | Status: DC
  Administered 2016-02-10: 2 [IU] via SUBCUTANEOUS

## 2016-02-10 MED ORDER — ROCURONIUM BROMIDE 10 MG/ML (PF) SYRINGE
PREFILLED_SYRINGE | INTRAVENOUS | Status: AC
  Filled 2016-02-10: qty 10

## 2016-02-10 MED ORDER — MENTHOL 3 MG MT LOZG: 1.0000 | LOZENGE | OROMUCOSAL | Status: DC | PRN

## 2016-02-10 MED ORDER — SODIUM CHLORIDE 0.9 % IV SOLN
INTRAVENOUS | Status: DC
  Administered 2016-02-11: 02:00:00 via INTRAVENOUS

## 2016-02-10 MED ORDER — DIPHENHYDRAMINE HCL 50 MG/ML IJ SOLN: 12.5000 mg | Freq: Four times a day (QID) | INTRAMUSCULAR | Status: DC | PRN

## 2016-02-10 MED ORDER — INSULIN ASPART 100 UNIT/ML ~~LOC~~ SOLN
0.0000 [IU] | Freq: Three times a day (TID) | SUBCUTANEOUS | Status: DC
  Administered 2016-02-11 – 2016-02-12 (×2): 2 [IU] via SUBCUTANEOUS

## 2016-02-10 MED ORDER — PHENOL 1.4 % MT LIQD: 1.0000 | OROMUCOSAL | Status: DC | PRN

## 2016-02-10 MED ORDER — CHLORHEXIDINE GLUCONATE CLOTH 2 % EX PADS: 6.0000 | MEDICATED_PAD | Freq: Once | CUTANEOUS | Status: DC

## 2016-02-10 MED ORDER — NEOSTIGMINE METHYLSULFATE 10 MG/10ML IV SOLN
INTRAVENOUS | Status: DC | PRN
  Administered 2016-02-10: 3 mg via INTRAVENOUS

## 2016-02-10 MED ORDER — NEOSTIGMINE METHYLSULFATE 5 MG/5ML IV SOSY
PREFILLED_SYRINGE | INTRAVENOUS | Status: AC
  Filled 2016-02-10: qty 5

## 2016-02-10 MED ORDER — GABAPENTIN 100 MG PO CAPS
100.0000 mg | ORAL_CAPSULE | Freq: Two times a day (BID) | ORAL | Status: DC
  Administered 2016-02-10 – 2016-02-12 (×4): 100 mg via ORAL
  Filled 2016-02-10 (×4): qty 1

## 2016-02-10 MED ORDER — DEXAMETHASONE SODIUM PHOSPHATE 10 MG/ML IJ SOLN
INTRAMUSCULAR | Status: AC
  Filled 2016-02-10: qty 1

## 2016-02-10 MED ORDER — ONDANSETRON HCL 4 MG/2ML IJ SOLN
4.0000 mg | Freq: Four times a day (QID) | INTRAMUSCULAR | Status: DC | PRN
  Administered 2016-02-11: 4 mg via INTRAVENOUS
  Filled 2016-02-10: qty 2

## 2016-02-10 MED ORDER — MORPHINE SULFATE 2 MG/ML IV SOLN
INTRAVENOUS | Status: DC
  Administered 2016-02-10: 14:00:00 via INTRAVENOUS
  Administered 2016-02-11 (×3): 0 mg via INTRAVENOUS

## 2016-02-10 MED ORDER — LACTATED RINGERS IV SOLN
INTRAVENOUS | Status: DC
  Administered 2016-02-10 (×2): via INTRAVENOUS

## 2016-02-10 MED ORDER — MEPERIDINE HCL 25 MG/ML IJ SOLN: 6.2500 mg | INTRAMUSCULAR | Status: DC | PRN

## 2016-02-10 MED ORDER — ACETAMINOPHEN 325 MG PO TABS
650.0000 mg | ORAL_TABLET | ORAL | Status: DC | PRN
  Administered 2016-02-12: 650 mg via ORAL
  Filled 2016-02-10: qty 2

## 2016-02-10 MED ORDER — METOPROLOL TARTRATE 12.5 MG HALF TABLET
12.5000 mg | ORAL_TABLET | Freq: Two times a day (BID) | ORAL | Status: DC
  Administered 2016-02-10 – 2016-02-12 (×4): 12.5 mg via ORAL
  Filled 2016-02-10 (×4): qty 1

## 2016-02-10 MED ORDER — SODIUM CHLORIDE 0.9% FLUSH: 9.0000 mL | INTRAVENOUS | Status: DC | PRN

## 2016-02-10 MED ORDER — THROMBIN 5000 UNITS EX SOLR
CUTANEOUS | Status: DC | PRN
  Administered 2016-02-10 (×2): 5000 [IU] via TOPICAL

## 2016-02-10 MED ORDER — HEMOSTATIC AGENTS (NO CHARGE) OPTIME
TOPICAL | Status: DC | PRN
  Administered 2016-02-10: 1 via TOPICAL

## 2016-02-10 MED ORDER — LIDOCAINE 2% (20 MG/ML) 5 ML SYRINGE
INTRAMUSCULAR | Status: AC
  Filled 2016-02-10: qty 5

## 2016-02-10 MED ORDER — ONDANSETRON HCL 4 MG/2ML IJ SOLN
INTRAMUSCULAR | Status: AC
  Filled 2016-02-10: qty 2

## 2016-02-10 MED ORDER — ETOMIDATE 2 MG/ML IV SOLN
INTRAVENOUS | Status: DC | PRN
  Administered 2016-02-10: 14 mg via INTRAVENOUS

## 2016-02-10 MED ORDER — CYCLOBENZAPRINE HCL 10 MG PO TABS: 10.0000 mg | ORAL_TABLET | Freq: Three times a day (TID) | ORAL | Status: DC | PRN

## 2016-02-10 MED ORDER — ONDANSETRON HCL 4 MG/2ML IJ SOLN
INTRAMUSCULAR | Status: AC
  Filled 2016-02-10: qty 4

## 2016-02-10 MED ORDER — THROMBIN 5000 UNITS EX SOLR
CUTANEOUS | Status: AC
  Filled 2016-02-10: qty 10000

## 2016-02-10 MED ORDER — SODIUM CHLORIDE 0.9% FLUSH
3.0000 mL | Freq: Two times a day (BID) | INTRAVENOUS | Status: DC
  Administered 2016-02-11 – 2016-02-12 (×3): 3 mL via INTRAVENOUS

## 2016-02-10 MED ORDER — SODIUM CHLORIDE 0.9% FLUSH: 3.0000 mL | INTRAVENOUS | Status: DC | PRN

## 2016-02-10 MED ORDER — ONDANSETRON HCL 4 MG/2ML IJ SOLN
INTRAMUSCULAR | Status: DC | PRN
  Administered 2016-02-10 (×2): 4 mg via INTRAVENOUS

## 2016-02-10 MED ORDER — ZOLPIDEM TARTRATE 5 MG PO TABS
5.0000 mg | ORAL_TABLET | Freq: Every day | ORAL | Status: DC
Start: 2016-02-10 — End: 2016-02-12
  Administered 2016-02-11: 5 mg via ORAL
  Filled 2016-02-10 (×2): qty 1

## 2016-02-10 MED ORDER — CEFAZOLIN IN D5W 1 GM/50ML IV SOLN
1.0000 g | Freq: Three times a day (TID) | INTRAVENOUS | Status: AC
  Administered 2016-02-10 – 2016-02-11 (×2): 1 g via INTRAVENOUS
  Filled 2016-02-10 (×2): qty 50

## 2016-02-10 MED ORDER — ACETAMINOPHEN 325 MG PO TABS: 325.0000 mg | ORAL_TABLET | Freq: Four times a day (QID) | ORAL | Status: DC | PRN

## 2016-02-10 MED ORDER — DOCUSATE SODIUM 100 MG PO CAPS
100.0000 mg | ORAL_CAPSULE | Freq: Two times a day (BID) | ORAL | Status: DC
  Administered 2016-02-10 – 2016-02-12 (×4): 100 mg via ORAL
  Filled 2016-02-10 (×4): qty 1

## 2016-02-10 MED ORDER — EPHEDRINE SULFATE 50 MG/ML IJ SOLN
INTRAMUSCULAR | Status: DC | PRN
  Administered 2016-02-10 (×6): 10 mg via INTRAVENOUS

## 2016-02-10 MED ORDER — DIPHENHYDRAMINE HCL 12.5 MG/5ML PO ELIX: 12.5000 mg | ORAL_SOLUTION | Freq: Four times a day (QID) | ORAL | Status: DC | PRN

## 2016-02-10 MED ORDER — ASPIRIN EC 81 MG PO TBEC
81.0000 mg | DELAYED_RELEASE_TABLET | Freq: Every day | ORAL | Status: DC
  Administered 2016-02-11 – 2016-02-12 (×2): 81 mg via ORAL
  Filled 2016-02-10 (×2): qty 1

## 2016-02-10 MED ORDER — BUPROPION HCL ER (XL) 300 MG PO TB24
300.0000 mg | ORAL_TABLET | Freq: Every day | ORAL | Status: DC
  Administered 2016-02-11 – 2016-02-12 (×2): 300 mg via ORAL
  Filled 2016-02-10 (×2): qty 1

## 2016-02-10 MED ORDER — GLYCOPYRROLATE 0.2 MG/ML IJ SOLN
INTRAMUSCULAR | Status: DC | PRN
  Administered 2016-02-10: 0.4 mg via INTRAVENOUS

## 2016-02-10 MED ORDER — MORPHINE SULFATE 2 MG/ML IV SOLN
INTRAVENOUS | Status: AC
  Administered 2016-02-10: 0 mg
  Filled 2016-02-10: qty 25

## 2016-02-10 MED ORDER — FENTANYL CITRATE (PF) 100 MCG/2ML IJ SOLN
INTRAMUSCULAR | Status: AC
  Filled 2016-02-10: qty 4

## 2016-02-10 MED ORDER — HYDROMORPHONE HCL 1 MG/ML IJ SOLN
0.2500 mg | INTRAMUSCULAR | Status: DC | PRN
  Administered 2016-02-10 (×2): 0.5 mg via INTRAVENOUS

## 2016-02-10 MED ORDER — PRAVASTATIN SODIUM 10 MG PO TABS
10.0000 mg | ORAL_TABLET | Freq: Every day | ORAL | Status: DC
  Administered 2016-02-10 – 2016-02-11 (×2): 10 mg via ORAL
  Filled 2016-02-10 (×3): qty 1

## 2016-02-10 MED ORDER — LIDOCAINE HCL (CARDIAC) 20 MG/ML IV SOLN
INTRAVENOUS | Status: DC | PRN
Start: 2016-02-10 — End: 2016-02-10
  Administered 2016-02-10: 60 mg via INTRAVENOUS

## 2016-02-10 MED ORDER — ISOSORBIDE MONONITRATE ER 30 MG PO TB24
30.0000 mg | ORAL_TABLET | Freq: Every day | ORAL | Status: DC
  Administered 2016-02-11 – 2016-02-12 (×2): 30 mg via ORAL
  Filled 2016-02-10 (×2): qty 1

## 2016-02-10 MED ORDER — ROCURONIUM BROMIDE 100 MG/10ML IV SOLN
INTRAVENOUS | Status: DC | PRN
  Administered 2016-02-10: 50 mg via INTRAVENOUS

## 2016-02-10 MED ORDER — LORATADINE 10 MG PO TABS
10.0000 mg | ORAL_TABLET | Freq: Every day | ORAL | Status: DC
  Administered 2016-02-11 – 2016-02-12 (×2): 10 mg via ORAL
  Filled 2016-02-10 (×2): qty 1

## 2016-02-10 MED ORDER — NALOXONE HCL 0.4 MG/ML IJ SOLN: 0.4000 mg | INTRAMUSCULAR | Status: DC | PRN

## 2016-02-10 MED ORDER — 0.9 % SODIUM CHLORIDE (POUR BTL) OPTIME
TOPICAL | Status: DC | PRN
  Administered 2016-02-10: 1000 mL

## 2016-02-10 MED ORDER — ACETAMINOPHEN 650 MG RE SUPP: 650.0000 mg | RECTAL | Status: DC | PRN

## 2016-02-10 SURGICAL SUPPLY — 56 items
BENZOIN TINCTURE PRP APPL 2/3 (GAUZE/BANDAGES/DRESSINGS) IMPLANT
BLADE CLIPPER SURG (BLADE) IMPLANT
BUR ACORN 6.0 (BURR) ×2 IMPLANT
BUR ACORN 6.0MM (BURR) ×1
BUR MATCHSTICK NEURO 3.0 LAGG (BURR) ×3 IMPLANT
CANISTER SUCT 3000ML PPV (MISCELLANEOUS) ×3 IMPLANT
CLOSURE WOUND 1/2 X4 (GAUZE/BANDAGES/DRESSINGS) ×1
DRAPE LAPAROTOMY 100X72X124 (DRAPES) ×3 IMPLANT
DRAPE MICROSCOPE LEICA (MISCELLANEOUS) ×3 IMPLANT
DRAPE POUCH INSTRU U-SHP 10X18 (DRAPES) ×3 IMPLANT
DRSG OPSITE POSTOP 4X6 (GAUZE/BANDAGES/DRESSINGS) IMPLANT
DRSG OPSITE POSTOP 4X8 (GAUZE/BANDAGES/DRESSINGS) ×3 IMPLANT
DRSG PAD ABDOMINAL 8X10 ST (GAUZE/BANDAGES/DRESSINGS) IMPLANT
DURAPREP 26ML APPLICATOR (WOUND CARE) ×3 IMPLANT
ELECT REM PT RETURN 9FT ADLT (ELECTROSURGICAL) ×3
ELECTRODE REM PT RTRN 9FT ADLT (ELECTROSURGICAL) ×1 IMPLANT
GAUZE SPONGE 4X4 12PLY STRL (GAUZE/BANDAGES/DRESSINGS) IMPLANT
GAUZE SPONGE 4X4 16PLY XRAY LF (GAUZE/BANDAGES/DRESSINGS) IMPLANT
GLOVE BIO SURGEON STRL SZ8 (GLOVE) ×3 IMPLANT
GLOVE BIOGEL M 8.0 STRL (GLOVE) ×3 IMPLANT
GLOVE BIOGEL PI IND STRL 7.0 (GLOVE) ×1 IMPLANT
GLOVE BIOGEL PI IND STRL 7.5 (GLOVE) ×2 IMPLANT
GLOVE BIOGEL PI INDICATOR 7.0 (GLOVE) ×2
GLOVE BIOGEL PI INDICATOR 7.5 (GLOVE) ×4
GLOVE EXAM NITRILE LRG STRL (GLOVE) IMPLANT
GLOVE EXAM NITRILE XL STR (GLOVE) IMPLANT
GLOVE EXAM NITRILE XS STR PU (GLOVE) IMPLANT
GLOVE SURG SS PI 6.5 STRL IVOR (GLOVE) ×6 IMPLANT
GOWN STRL REUS W/ TWL LRG LVL3 (GOWN DISPOSABLE) ×2 IMPLANT
GOWN STRL REUS W/ TWL XL LVL3 (GOWN DISPOSABLE) ×2 IMPLANT
GOWN STRL REUS W/TWL 2XL LVL3 (GOWN DISPOSABLE) IMPLANT
GOWN STRL REUS W/TWL LRG LVL3 (GOWN DISPOSABLE) ×4
GOWN STRL REUS W/TWL XL LVL3 (GOWN DISPOSABLE) ×4
KIT BASIN OR (CUSTOM PROCEDURE TRAY) ×3 IMPLANT
KIT ROOM TURNOVER OR (KITS) ×3 IMPLANT
NEEDLE HYPO 18GX1.5 BLUNT FILL (NEEDLE) IMPLANT
NEEDLE HYPO 21X1.5 SAFETY (NEEDLE) IMPLANT
NEEDLE HYPO 25X1 1.5 SAFETY (NEEDLE) IMPLANT
NEEDLE SPNL 20GX3.5 QUINCKE YW (NEEDLE) ×3 IMPLANT
NS IRRIG 1000ML POUR BTL (IV SOLUTION) ×3 IMPLANT
PACK LAMINECTOMY NEURO (CUSTOM PROCEDURE TRAY) ×3 IMPLANT
PAD ARMBOARD 7.5X6 YLW CONV (MISCELLANEOUS) ×9 IMPLANT
PATTIES SURGICAL .5 X1 (DISPOSABLE) ×3 IMPLANT
RUBBERBAND STERILE (MISCELLANEOUS) ×6 IMPLANT
SPONGE LAP 4X18 X RAY DECT (DISPOSABLE) IMPLANT
SPONGE SURGIFOAM ABS GEL SZ50 (HEMOSTASIS) ×3 IMPLANT
STAPLER VISISTAT 35W (STAPLE) ×3 IMPLANT
STRIP CLOSURE SKIN 1/2X4 (GAUZE/BANDAGES/DRESSINGS) ×2 IMPLANT
SUT VIC AB 0 CT1 18XCR BRD8 (SUTURE) ×1 IMPLANT
SUT VIC AB 0 CT1 8-18 (SUTURE) ×2
SUT VIC AB 2-0 CP2 18 (SUTURE) ×3 IMPLANT
SUT VIC AB 3-0 SH 8-18 (SUTURE) ×3 IMPLANT
SYR 5ML LL (SYRINGE) IMPLANT
TOWEL OR 17X24 6PK STRL BLUE (TOWEL DISPOSABLE) ×3 IMPLANT
TOWEL OR 17X26 10 PK STRL BLUE (TOWEL DISPOSABLE) ×3 IMPLANT
WATER STERILE IRR 1000ML POUR (IV SOLUTION) ×3 IMPLANT

## 2016-02-10 NOTE — Anesthesia Postprocedure Evaluation (Signed)
Anesthesia Post Note  Patient: Cynthia Wood  Procedure(s) Performed: Procedure(s) (LRB): Right Lumbar four-five, Lumbar five-Sacral one Laminectomy/Foraminotomy (Right)  Patient location during evaluation: PACU Anesthesia Type: General Level of consciousness: awake and alert Pain management: pain level controlled Vital Signs Assessment: post-procedure vital signs reviewed and stable Respiratory status: spontaneous breathing, nonlabored ventilation, respiratory function stable and patient connected to nasal cannula oxygen Cardiovascular status: blood pressure returned to baseline and stable Postop Assessment: no signs of nausea or vomiting Anesthetic complications: no    Last Vitals:  Vitals:   02/10/16 1500 02/10/16 1505  BP: (!) 171/68 (!) 184/64  Pulse: 75 72  Resp: 20 18  Temp:  36.1 C    Last Pain:  Vitals:   02/10/16 1505  TempSrc:   PainSc: Asleep                 Areon Cocuzza DAVID

## 2016-02-10 NOTE — Anesthesia Preprocedure Evaluation (Signed)
Anesthesia Evaluation  Patient identified by MRN, date of birth, ID band Patient awake    Reviewed: Allergy & Precautions, NPO status , Patient's Chart, lab work & pertinent test results  History of Anesthesia Complications (+) PONV  Airway Mallampati: I  TM Distance: >3 FB Neck ROM: Full    Dental   Pulmonary former smoker,    Pulmonary exam normal        Cardiovascular hypertension, Pt. on medications + CAD and + CABG  Normal cardiovascular exam     Neuro/Psych    GI/Hepatic GERD  Medicated and Controlled,  Endo/Other  diabetes  Renal/GU      Musculoskeletal   Abdominal   Peds  Hematology   Anesthesia Other Findings   Reproductive/Obstetrics                             Anesthesia Physical Anesthesia Plan  ASA: III  Anesthesia Plan: General   Post-op Pain Management:    Induction: Intravenous  Airway Management Planned: Oral ETT  Additional Equipment:   Intra-op Plan:   Post-operative Plan: Extubation in OR  Informed Consent: I have reviewed the patients History and Physical, chart, labs and discussed the procedure including the risks, benefits and alternatives for the proposed anesthesia with the patient or authorized representative who has indicated his/her understanding and acceptance.     Plan Discussed with: CRNA and Surgeon  Anesthesia Plan Comments:         Anesthesia Quick Evaluation

## 2016-02-10 NOTE — Transfer of Care (Signed)
Immediate Anesthesia Transfer of Care Note  Patient: Cynthia Wood  Procedure(s) Performed: Procedure(s): Right Lumbar four-five, Lumbar five-Sacral one Laminectomy/Foraminotomy (Right)  Patient Location: PACU  Anesthesia Type:General  Level of Consciousness: awake, alert , oriented and patient cooperative  Airway & Oxygen Therapy: Patient Spontanous Breathing and Patient connected to nasal cannula oxygen  Post-op Assessment: Report given to RN and Post -op Vital signs reviewed and stable  Post vital signs: Reviewed and stable  Last Vitals:  Vitals:   02/10/16 0910 02/10/16 1416  BP: (!) 140/57   Pulse: (!) 54 77  Resp: 20 (!) 8  Temp: 36.5 C 36.2 C    Last Pain:  Vitals:   02/10/16 1416  TempSrc:   PainSc: Asleep         Complications: No apparent anesthesia complications

## 2016-02-11 ENCOUNTER — Ambulatory Visit (HOSPITAL_COMMUNITY): Payer: Medicare Other

## 2016-02-11 ENCOUNTER — Encounter (HOSPITAL_COMMUNITY): Payer: Self-pay | Admitting: Neurosurgery

## 2016-02-11 DIAGNOSIS — M5489 Other dorsalgia: Secondary | ICD-10-CM

## 2016-02-11 DIAGNOSIS — M48061 Spinal stenosis, lumbar region without neurogenic claudication: Secondary | ICD-10-CM | POA: Diagnosis not present

## 2016-02-11 DIAGNOSIS — I491 Atrial premature depolarization: Secondary | ICD-10-CM

## 2016-02-11 DIAGNOSIS — M545 Low back pain: Secondary | ICD-10-CM | POA: Diagnosis not present

## 2016-02-11 LAB — CBC WITH DIFFERENTIAL/PLATELET
BASOS ABS: 0.1 10*3/uL (ref 0.0–0.1)
Basophils Relative: 1 %
EOS ABS: 0.2 10*3/uL (ref 0.0–0.7)
EOS PCT: 2 %
HCT: 31.4 % — ABNORMAL LOW (ref 36.0–46.0)
Hemoglobin: 10.3 g/dL — ABNORMAL LOW (ref 12.0–15.0)
LYMPHS PCT: 21 %
Lymphs Abs: 1.8 10*3/uL (ref 0.7–4.0)
MCH: 30.1 pg (ref 26.0–34.0)
MCHC: 32.8 g/dL (ref 30.0–36.0)
MCV: 91.8 fL (ref 78.0–100.0)
MONO ABS: 0.6 10*3/uL (ref 0.1–1.0)
Monocytes Relative: 8 %
Neutro Abs: 5.7 10*3/uL (ref 1.7–7.7)
Neutrophils Relative %: 68 %
PLATELETS: 167 10*3/uL (ref 150–400)
RBC: 3.42 MIL/uL — ABNORMAL LOW (ref 3.87–5.11)
RDW: 12.9 % (ref 11.5–15.5)
WBC: 8.4 10*3/uL (ref 4.0–10.5)

## 2016-02-11 LAB — MAGNESIUM: MAGNESIUM: 1.8 mg/dL (ref 1.7–2.4)

## 2016-02-11 LAB — COMPREHENSIVE METABOLIC PANEL
ALBUMIN: 3.2 g/dL — AB (ref 3.5–5.0)
ALK PHOS: 58 U/L (ref 38–126)
ALT: 12 U/L — AB (ref 14–54)
AST: 25 U/L (ref 15–41)
Anion gap: 10 (ref 5–15)
BUN: 12 mg/dL (ref 6–20)
CALCIUM: 9.4 mg/dL (ref 8.9–10.3)
CHLORIDE: 103 mmol/L (ref 101–111)
CO2: 24 mmol/L (ref 22–32)
CREATININE: 1.03 mg/dL — AB (ref 0.44–1.00)
GFR calc non Af Amer: 51 mL/min — ABNORMAL LOW (ref >60)
GFR, EST AFRICAN AMERICAN: 59 mL/min — AB (ref >60)
GLUCOSE: 152 mg/dL — AB (ref 65–99)
Potassium: 4.2 mmol/L (ref 3.5–5.1)
SODIUM: 137 mmol/L (ref 135–145)
Total Bilirubin: 0.5 mg/dL (ref 0.3–1.2)
Total Protein: 6.3 g/dL — ABNORMAL LOW (ref 6.5–8.1)

## 2016-02-11 LAB — GLUCOSE, CAPILLARY
GLUCOSE-CAPILLARY: 119 mg/dL — AB (ref 65–99)
GLUCOSE-CAPILLARY: 155 mg/dL — AB (ref 65–99)
Glucose-Capillary: 132 mg/dL — ABNORMAL HIGH (ref 65–99)
Glucose-Capillary: 117 mg/dL — ABNORMAL HIGH (ref 65–99)

## 2016-02-11 LAB — PHOSPHORUS: PHOSPHORUS: 2.7 mg/dL (ref 2.5–4.6)

## 2016-02-11 MED ORDER — ORAL CARE MOUTH RINSE
15.0000 mL | Freq: Two times a day (BID) | OROMUCOSAL | Status: DC
  Administered 2016-02-11 – 2016-02-12 (×3): 15 mL via OROMUCOSAL

## 2016-02-11 MED ORDER — MORPHINE SULFATE (PF) 2 MG/ML IV SOLN: 1.0000 mg | INTRAVENOUS | Status: DC | PRN

## 2016-02-11 NOTE — H&P (Signed)
NAMDesiree Lucy:  Wood, Cynthia Wood            ACCOUNT NO.:  [id number]  MEDICAL RECORD NO.:  [id number]  LOCATION:  5M03C                        FACILITY:  [facility]  PHYSICIAN:  Hilda LiasErnesto Ziya Coonrod, M.D.   DATE OF BIRTH:  December 11, 1940  DATE OF ADMISSION:  02/10/2016 DATE OF DISCHARGE:                             HISTORY & PHYSICAL   HISTORY OF PRESENT ILLNESS:  Ms. Cynthia MeekerMiller is a lady who was seen by me initially about 5 months ago complaining of back pain radiation to the right leg all the way down to the right foot associated with numbness for almost a year.  The patient had several x-ray.  She denies any pain in the left leg.  She is getting worse.  Driving is quite difficult and she had to stop often to release the pain.  The patient had an MRI, sent to us for evaluation and she want to proceed with surgery.  PAST MEDICAL HISTORY:  Knee surgery, right shoulder surgery twice.  REVIEW OF SYSTEMS:  Positive for back pain, right leg pain, weakness, high blood pressure, balance disturbance.  ALLERGIES:  She is allergic to PERCOCET, PERCODAN and DILANTIN.  FAMILY HISTORY:  Negative.  SOCIAL HISTORY:  Negative.  PHYSICAL EXAMINATION:  GENERAL:  The patient came to my office limping from the right leg. HEAD, EARS, NOSE AND THROAT:  Normal. NECK:  Normal. CARDIOVASCULAR:  Normal. LUNGS:  Clear. ABDOMEN:  Normal. EXTREMITIES:  Normal. NEUROLOGICAL EXAMINATION:  She has weakness on the right foot about 2/5 on plantarflexion and dorsiflexion.  Straight leg raising, SLR is positive at 15 degrees.  She has a positive sciatic notch tenderness on the right side.  The x-ray showed that she has stenosis at the level of L5-S1 secondary to a large central disk.  The MRI, the latest one showed narrow at the level of 4-5 and the disk at the level of 5-1.  CLINICAL IMPRESSION:  Right-sided radiculopathy.  RECOMMENDATION:  The patient being admitted for surgery.  The procedure will be at right L5  hemilaminectomy, foraminotomy and decompression of the L4, L5 and S1 nerve roots.  The patient knew the risk with the surgery as well as the benefits.          ______________________________ Hilda LiasErnesto Anjelique Makar, M.D.     EB/MEDQ  D:  02/10/2016  T:  02/11/2016  Job:  [id number]

## 2016-02-11 NOTE — Progress Notes (Signed)
MD in at shift change, took honey comb dressing off, wound CDI, RN not applying new honeycomb at this time. Continue to monitor.

## 2016-02-11 NOTE — Significant Event (Signed)
Rapid Response Event Note  Overview: Called by RN for patient with sudden headache and irregular rhythm Time Called: 1611 Arrival Time: 1615 Event Type: Other (Comment)  Initial Focused Assessment:  Called by RN for patient with sudden headache behind right eye and as per RN had irregular heart rate noted on dinamap.  Patient states she had a weird feeling at the same time, but headache came and went quickly.  On my arrival to patietns room, RN and family at bedside.  Patient lying flat with nasal cannula.  Patient denies, CP, SOB, headache, lightheaded or dizzy.  Equal grips noted.     Interventions:  EKG done result reviewed, no changes from earlier EKG.  MD notified and updated.  CBG 119.  VS 55, 134/56, 100%, RR 13.  Plan of Care (if not transferred):  RN to monitor  Event Summary:  RN to call if assistance needed   at      at          [facility] LLCWolfe, Maryagnes Amosenise Ann

## 2016-02-11 NOTE — Clinical Social Work Note (Signed)
CSW consulted for New SNF. PT is recommending HHPT. CSW udpated RNCM. CSW is signing off as no further needs identified.   Dede QuerySarah Analaura Messler, MSW, LCSW Clinical Social Worker  [phone]

## 2016-02-11 NOTE — Progress Notes (Signed)
Patient ID: Cynthia Wood, female   DOB: [birth year], 75 y.o.   MRN: [id number] Patient stable. No right leg pain as before surgery. No weakness. Non positional headache. Wound flat

## 2016-02-11 NOTE — Op Note (Signed)
NAMDesiree Wood:  Lochner, Khloe            ACCOUNT NO.:  [id number]  MEDICAL RECORD NO.:  [id number]  LOCATION:  5M03C                        FACILITY:  [facility]  PHYSICIAN:  Hilda LiasErnesto Ahmia Colford, M.D.   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  PROCEDURE:  Right L4 and L5 hemilaminectomy, decompression of the L4, L5 and S1 nerve roots.  Microscope.  SURGEON:  Hilda LiasErnesto Jeral Zick, M.D.  CLINICAL HISTORY:  [name] is a lady who was seen in my office complaining of back pain radiation to the right leg, which had been going on for many years.  The patient had difficulty driving and see.  X- ray shows stenosis.  She had no complaints whatsoever in the left leg. Most of the pain is going to the right leg affecting the L5-S1 nerve root.  Surgery was advised.  She knew the risks and benefits.  DESCRIPTION OF PROCEDURE:  The patient was taken to the OR, and after intubation, she was positioned in a prone manner.  The back was cleaned with DuraPrep and drapes were applied.  Midline incision was made and retraction of the muscle was made in the right side.  We did several x- ray during the procedure because the patient has quite a bit of hyperlordosis.  Nevertheless, with the help of the microscope and the drill, we did the hemilaminectomy of L4-L5.  The patient has quite a bit large facet.  Removal of the medial aspect of the facet was done.  The patient has a calcified ligament and removal was accomplished with the 1 and 2-mm Kerrison punch.  Then, using the drill as well as the 1, 2 and 3-mm punch, we removed the lateral aspect of the lamina and we went into the forearm.  Decompression of the thecal sac as well as the L4, L5 and S1 nerve root was  done.  The last x-ray showed that we were on top of L5 and from then on, the procedure was carried out all the way down to S1. At the end, we have a good decompression.  The area was irrigated. Valsalva maneuver was negative.  The wound was closed with different layer of Vicryl and staple.          ______________________________ Hilda LiasErnesto Jakeia Carreras, M.D.     EB/MEDQ  D:  02/10/2016  T:  02/11/2016  Job:  [id number]

## 2016-02-11 NOTE — Care Management Note (Signed)
Case Management Note  Patient Details  Name: Jacklin Zwick MRN: [id number] Date of Birth: 1940-12-17  Subjective/Objective:      Pt s/p lumbar surgery. She is from home with her spouse.               Action/Plan: MD with orders for [facility] services and DME. CM met with the patient and provided her a list of [place] agencies in the [place], [place] area. She selected [facility]. Sharmon Revere with [facility] notified and accepted the referral. Pt also with orders for walker and 3 in [place] with [facility] DME notified and will deliver the equipment to the room.   Expected Discharge Date:                  Expected Discharge Plan:  [place]  In-House Referral:     Discharge planning Services  CM Consult  Post Acute Care Choice:  Durable Medical Equipment, Home Health Choice offered to:  Patient  DME Arranged:  3-N-1, Walker rolling DME Agency:  [place] Arranged:  PT, OT, Nurse's Aide [place] Agency:  [place]  Status of Service:  Completed, signed off  If discussed at [place] of Stay Meetings, dates discussed:    Additional Comments:  Pollie Friar, RN 02/11/2016, 2:07 PM

## 2016-02-11 NOTE — Progress Notes (Signed)
Lab called 2156 stating CBC had clot and was rejected needing redraw and would be reordered.  Continue to monitor patient.

## 2016-02-11 NOTE — Progress Notes (Signed)
Orthopedic Tech Progress Note Patient Details:  Cynthia Wood 28-Dec-1940 [id number]      Cynthia FordyceJennifer C [name] 02/11/2016, 11:18 [facility].

## 2016-02-11 NOTE — Evaluation (Signed)
Physical Therapy Evaluation Patient Details Name: Cynthia Wood MRN: [id number] DOB: [birth year] Today's Date: 02/11/2016   History of Present Illness  75 y/o female s/p R l4 & L5 hemilaminectomy and decompression L4, L5 and S1 nerve roots (02/10/16) PMH : HTn, GERD, DM, TKA  Clinical Impression  Pt admitted with above diagnosis. Pt currently with functional limitations due to the deficits listed below (see PT Problem List).  Pt will benefit from skilled PT to increase their independence and safety with mobility to allow discharge to the venue listed below.  Pt educated on back precautions and how to incorporate into her mobility. Recommend HHPT, 3-1 BSC and RW for home use.     Follow Up Recommendations Home health PT    Equipment Recommendations  Rolling walker with 5" wheels;3in1 (PT)    Recommendations for Other Services       Precautions / Restrictions Precautions Precautions: Back Precaution Booklet Issued: Yes (comment) Required Braces or Orthoses: Spinal Brace Spinal Brace: Lumbar corset Restrictions Weight Bearing Restrictions: No      Mobility  Bed Mobility Overal bed mobility: Needs Assistance Bed Mobility: Sidelying to Sit;Rolling Rolling: Supervision Sidelying to sit: Min guard       General bed mobility comments: cues for proper body mechanics  Transfers Overall transfer level: Needs assistance Equipment used: Rolling walker (2 wheeled) Transfers: Sit to/from Stand Sit to Stand: Min guard         General transfer comment: cues for proper technique and hand placement  Ambulation/Gait Ambulation/Gait assistance: Min guard Ambulation Distance (Feet): 100 Feet Assistive device: Rolling walker (2 wheeled) Gait Pattern/deviations: Step-through pattern Gait velocity: decreased   General Gait Details: decreased speed, but overall steady with use of RW  Stairs            Wheelchair Mobility    Modified Rankin (Stroke Patients Only)       Balance Overall balance assessment: Needs assistance   Sitting balance-Leahy Scale: Good       Standing balance-Leahy Scale: Fair                               Pertinent Vitals/Pain Pain Assessment: 0-10 Pain Score: 6  Pain Location: lower back Pain Intervention(s): Limited activity within patient's tolerance;Repositioned    Home Living Family/patient expects to be discharged to:: Private residence Living Arrangements: Spouse/significant other Available Help at Discharge: Family;Available 24 hours/day (daughter and then niece will be here for about 10 days to help) Type of Home: House Home Access: Stairs to enter   [employer] of Steps: 2 (A brick up to porch and then a brick up into house) Home Layout: Multi-level;Able to live on main level with bedroom/bathroom Home Equipment: Grab bars - tub/shower      Prior Function Level of Independence: Independent         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     Hand Dominance        Extremity/Trunk Assessment   Upper Extremity Assessment: Defer to OT evaluation           Lower Extremity Assessment: Overall WFL for tasks assessed      Cervical / Trunk Assessment: Other exceptions  Communication   Communication: No difficulties  Cognition Arousal/Alertness: Awake/alert Behavior During Therapy: WFL for tasks assessed/performed Overall Cognitive Status: Within Functional Limits for tasks assessed  General Comments General comments (skin integrity, edema, etc.): pt with nausea and had shot just prior to PT session    Exercises     Assessment/Plan    PT Assessment Patient needs continued PT services  PT Problem List Decreased activity tolerance;Decreased balance;Decreased knowledge of precautions;Decreased mobility;Decreased knowledge of use of DME;Pain          PT Treatment Interventions DME instruction;Gait training;Stair training;Functional mobility  training;Balance training;Therapeutic activities;Therapeutic exercise    PT Goals (Current goals can be found in the Care Plan section)  Acute Rehab PT Goals Patient Stated Goal: home with rehab PT Goal Formulation: With patient Time For Goal Achievement: 02/16/16 Potential to Achieve Goals: Good    Frequency Min 5X/week   Barriers to discharge        Co-evaluation               End of Session Equipment Utilized During Treatment: Gait belt Activity Tolerance: Patient tolerated treatment well Patient left: in chair;with call bell/phone within reach;Other (comment) (with case management) Nurse Communication: Mobility status    Functional Assessment Tool Used: clinical judgement and objective findings Functional Limitation: Mobility: Walking and moving around Mobility: Walking and Moving Around Current Status ([id number](G8978): At least 1 percent but less than 20 percent impaired, limited or restricted Mobility: Walking and Moving Around Goal Status [phone](G8979): At least 1 percent but less than 20 percent impaired, limited or restricted    Time: [id number] PT Time Calculation (min) (ACUTE ONLY): 32 min   Charges:   PT Evaluation $PT Eval Moderate Complexity: 1 Procedure PT Treatments $Therapeutic Activity: 8-22 mins   PT G Codes:   PT G-Codes **NOT FOR INPATIENT CLASS** Functional Assessment Tool Used: clinical judgement and objective findings Functional Limitation: Mobility: Walking and moving around Mobility: Walking and Moving Around Current Status ([id number](G8978): At least 1 percent but less than 20 percent impaired, limited or restricted Mobility: Walking and Moving Around Goal Status [phone](G8979): At least 1 percent but less than 20 percent impaired, limited or restricted    [facility]nslow Memorial HospitalMITH,Norlan Rann LUBECK 02/11/2016, 2:09 PM

## 2016-02-11 NOTE — Progress Notes (Signed)
Patient ID: Cynthia Wood, female   DOB: Aug 12, 1940, 75 y.o.   MRN: [id number] C/o incisional pain. No right leg pain as before. No weakness. Pt/ot to see her. Discharge in am

## 2016-02-11 NOTE — Consult Note (Signed)
[facility] Consult History and Physical  Cynthia Wood ZOX:[id number] DOB: April 22, 1941 DOA: 02/10/2016  Referring physician:  PCP: Kathlee Nations, MD   Chief Complaint: "I got this pain high in my back."  HPI: Cynthia Wood is a 75 y.o. female with pmhx sig for CAD, heart dz, HTN. Admitted for lumbar surgery. Pt had sudden onset of upper back pain. Nurses called. Had soft BP per nurses. Also had HA that was all over her head. EKG done showed PACs. Then pt's sx spontaneously resolved.   Review of Systems:  As per HPI otherwise 10 point review of systems negative.    Past Medical History:  Diagnosis Date  . Arthritis    "knees, back" (02/10/2016)  . Chronic lower back pain   . Coronary artery disease   . Family history of adverse reaction to anesthesia    "my father had PONV" (02/10/2016)  . GERD (gastroesophageal reflux disease)   . Heart disease   . High cholesterol   . Hypertension   . Neuromuscular disorder (HCC)    neuropathy  . Pneumonia ~ 2007  . PONV (postoperative nausea and vomiting)   . Type II diabetes mellitus (HCC)    Past Surgical History:  Procedure Laterality Date  . APPENDECTOMY    . BACK SURGERY    . CARDIAC CATHETERIZATION  2002   "right before the bypass OR"  . CATARACT EXTRACTION W/ INTRAOCULAR LENS  IMPLANT, BILATERAL Bilateral   . CHOLECYSTECTOMY    . CORONARY ANGIOPLASTY WITH STENT PLACEMENT  2006   "2 stents"  . CORONARY ARTERY BYPASS GRAFT  2002   "CABG X3"  . DILATION AND CURETTAGE OF UTERUS    . JOINT REPLACEMENT    . LUMBAR LAMINECTOMY Right 02/10/2016   Lumbar four-five, Lumbar five-Sacral one   . LUMBAR LAMINECTOMY/DECOMPRESSION MICRODISCECTOMY Right 02/10/2016   Procedure: Right Lumbar four-five, Lumbar five-Sacral one Laminectomy/Foraminotomy;  Surgeon: Hilda Lias, MD;  Location: [facility] OR;  Service: Neurosurgery;  Laterality: Right;  . SHOULDER OPEN ROTATOR CUFF REPAIR Right ~ 2005  . TONSILLECTOMY    . TOTAL KNEE  ARTHROPLASTY Right 2014  . VAGINAL HYSTERECTOMY     Social History:  reports that she has quit smoking. Her smoking use included Cigarettes. She has a 1.80 pack-year smoking history. She has never used smokeless tobacco. She reports that she drinks alcohol. She reports that she does not use drugs.  Allergies  Allergen Reactions  . Propofol Hives and Itching    nausea  . Fentanyl Hives and Nausea And Vomiting  . Oxycodone Nausea And Vomiting and Other (See Comments)    "Caused me to be a little crazy"    Family History  Problem Relation Age of Onset  . Heart failure Mother   . Breast cancer Sister      Prior to Admission medications   Medication Sig Start Date End Date Taking? Authorizing Provider  acetaminophen (TYLENOL) 500 MG tablet Take 500 mg by mouth every 4 (four) hours as needed.   Yes Historical Provider, MD  aspirin EC 81 MG tablet Take 81 mg by mouth daily.    Yes Historical Provider, MD  BIOTIN 5000 PO Take 1 tablet by mouth daily.   Yes Historical Provider, MD  buPROPion (WELLBUTRIN XL) 300 MG 24 hr tablet Take 300 mg by mouth daily.  07/20/15  Yes Historical Provider, MD  furosemide (LASIX) 40 MG tablet Take 40 mg by mouth every other day.  06/11/15  Yes Historical Provider, MD  gabapentin (NEURONTIN) 100  MG capsule Take 100 mg by mouth 2 (two) times daily.  08/10/15  Yes Historical Provider, MD  isosorbide mononitrate (IMDUR) 30 MG 24 hr tablet Take 30 mg by mouth daily.  03/20/15  Yes Historical Provider, MD  lidocaine (LIDODERM) 5 % Place 1 patch onto the skin daily.  08/12/15  Yes Historical Provider, MD  loratadine (CLARITIN) 10 MG tablet Take 10 mg by mouth daily.    Yes Historical Provider, MD  lovastatin (MEVACOR) 40 MG tablet Take 40 mg by mouth at bedtime.  04/27/15  Yes Historical Provider, MD  metFORMIN (GLUCOPHAGE) 500 MG tablet Take 500 mg by mouth daily with breakfast.  09/15/15  Yes Historical Provider, MD  metoprolol tartrate (LOPRESSOR) 25 MG tablet Take 12.5  mg by mouth 2 (two) times daily.  08/10/15  Yes Historical Provider, MD  Multiple Vitamins-Minerals (CENTRUM SILVER) tablet Take 1 tablet by mouth daily.    Yes Historical Provider, MD  zolpidem (AMBIEN) 5 MG tablet Take 5 mg by mouth at bedtime.  08/24/15  Yes Historical Provider, MD   Physical Exam: Vitals:   02/11/16 0510 02/11/16 0946 02/11/16 1425 02/11/16 1753  BP: (!) 131/52 (!) 119/40 (!) 101/54 (!) 145/55  Pulse: (!) 59 (!) 57 87 89  Resp: 15 14 16 18   Temp: 98.3 F (36.8 C) 97.7 F (36.5 C) 98.1 F (36.7 C) 97.5 F (36.4 C)  TempSrc: Oral Oral Oral Oral  SpO2: 99% 100% 100% (!) 10%  Weight:      Height:        Wt Readings from Last 3 Encounters:  02/10/16 82.8 kg (182 lb 8.7 oz)  02/08/16 82.8 kg (182 lb 9 oz)  10/22/15 83.9 kg (185 lb)    General:  Appears calm and comfortable Eyes:  PERRL, EOMI, normal lids, iris ENT:  grossly normal hearing, lips & tongue Neck:  no LAD, masses or thyromegaly Cardiovascular:  RRR, no m/r/g. No LE edema.  Respiratory:  CTA bilaterally, no w/r/r. Normal respiratory effort. Abdomen:  soft, ntnd Skin:  no rash or induration seen on limited exam; lumbar surgical scar with staples and dermabond & no signs of infxn Musculoskeletal:  grossly normal tone BUE/BLE Psychiatric:  grossly normal mood and affect, speech fluent and appropriate Neurologic:  CN 2-12 grossly intact, moves all extremities in coordinated fashion. A&Ox3          Labs on Admission:  Basic Metabolic Panel:  Recent Labs Lab 02/08/16 1403  NA 140  K 4.4  CL 109  CO2 24  GLUCOSE 110*  BUN 19  CREATININE 1.04*  CALCIUM 10.1   Liver Function Tests: No results for input(s): AST, ALT, ALKPHOS, BILITOT, PROT, ALBUMIN in the last 168 hours. No results for input(s): LIPASE, AMYLASE in the last 168 hours. No results for input(s): AMMONIA in the last 168 hours. CBC:  Recent Labs Lab 02/08/16 1403  WBC 7.2  HGB 12.4  HCT 37.6  MCV 94.0  PLT 211   Cardiac  Enzymes: No results for input(s): CKTOTAL, CKMB, CKMBINDEX, TROPONINI in the last 168 hours.  BNP (last 3 results) No results for input(s): BNP in the last 8760 hours.  ProBNP (last 3 results) No results for input(s): PROBNP in the last 8760 hours.   Serum creatinine: 1.04 mg/dL High 11/91/4709/01/16 [id number] Estimated creatinine clearance: 52.1 mL/min  CBG:  Recent Labs Lab 02/10/16 1414 02/10/16 2148 02/11/16 0647 02/11/16 1204 02/11/16 1627  GLUCAP 144* 216* 132* 117* 119*    Radiological Exams on  Admission: Dg Lumbar Spine 2-3 Views  Result Date: 02/10/2016 CLINICAL DATA:  Lumbar laminectomy EXAM: LUMBAR SPINE - 2 VIEW COMPARISON:  01/13/2016 FINDINGS: Two lateral plain films were obtained of the lumbar spine intraoperatively. The initial image demonstrates a surgical retractor at the L3-4 level posteriorly with surgical instrument extending towards the posterior aspect of the L3 vertebral body. Subsequent film shows surgical retractor at the L4 level with instruments posterior to the L4 and L5 vertebral bodies. IMPRESSION: Intraoperative localization as described Electronically Signed   By: Alcide Clever M.D.   On: 02/10/2016 14:37    EKG: Independently reviewed. No STEMI.  Reviewed admit EKG, no acute changes.  Assessment/Plan Active Problems:   Lumbar canal stenosis   Lumbar Canal stenosis - mgmt per primary - likely d/c in AM  Rapid response - no acute findings on EKG - resolved - checking labs - checking CXR 1 view - placed on tele  DM - SSI  HTN - cont lopressor and imdur   Haydee Salter, MD Family Medicine [facility] [email or web address] Password [email or web address]

## 2016-02-11 NOTE — Evaluation (Signed)
Occupational Therapy Evaluation Patient Details Name: Cynthia Wood MRN: [id number] DOB: 12-15-1940 Today's Date: 02/11/2016    History of Present Illness 75 y/o female s/p R l4 & L5 hemilaminectomy and decompression L4, L5 and S1 nerve roots (02/10/16) PMH : HTn, GERD, DM, TKA   Clinical Impression   Pt admitted with the above diagnoses and presents with below problem list. Pt will benefit from continued acute OT to address the below listed deficits and maximize independence with basic ADLs prior to d/c home with family. PTA pt was independent with ADLs. Pt is currently min guard to min A with LB/OOB ADLs. Of note, O2 dropping to low 80s 2x while ambulating to bathroom on RA. Recovered quickly to mid 90s with breathing techniques.       Follow Up Recommendations  Home health OT;Supervision/Assistance - 24 hour    Equipment Recommendations  3 in 1 bedside comode    Recommendations for Other Services       Precautions / Restrictions Precautions Precautions: Back Precaution Booklet Issued: Yes (comment) Required Braces or Orthoses: Spinal Brace Spinal Brace: Lumbar corset Restrictions Weight Bearing Restrictions: No      Mobility Bed Mobility Overal bed mobility: Needs Assistance Bed Mobility: Rolling;Sit to Supine Rolling: Min assist Sidelying to sit: Min guard   Sit to supine: Min assist   General bed mobility comments: min A to legs and back to maintain precautions with pt having difficulty with sit>sidelying and moving into more of a sit>supine transfer.   Transfers Overall transfer level: Needs assistance Equipment used: Rolling walker (2 wheeled) Transfers: Sit to/from Stand Sit to Stand: Min guard;Min assist         General transfer comment: cues for proper technique and hand placement. Light min A to stand from lower surface of recliner. min guard to/from EOB and 3n1.    Balance Overall balance assessment: Needs assistance   Sitting balance-Leahy  Scale: Good       Standing balance-Leahy Scale: Fair                              ADL Overall ADL's : Needs assistance/impaired Eating/Feeding: Set up;Sitting   Grooming: Sitting;Set up   Upper Body Bathing: Sitting;Set up   Lower Body Bathing: Minimal assistance;Sit to/from stand   Upper Body Dressing : Set up;Sitting   Lower Body Dressing: Minimal assistance;Sit to/from stand   Toilet Transfer: Min guard;Ambulation;BSC;RW   Toileting- Clothing Manipulation and Hygiene: Set up;Sitting/lateral lean   Tub/ Shower Transfer: Walk-in shower;Min guard;Ambulation;Shower seat;Rolling walker   Functional mobility during ADLs: Min guard;Rolling walker General ADL Comments: Toilet transfer, pericare, and bed mobility completed as detailed above. ADL education provided.      Vision     Perception     Praxis      Pertinent Vitals/Pain Pain Assessment: 0-10 Pain Score: 4  Pain Location: lower back Pain Descriptors / Indicators: Sore Pain Intervention(s): Limited activity within patient's tolerance;Monitored during session;Repositioned;Premedicated before session     Hand Dominance     Extremity/Trunk Assessment Upper Extremity Assessment Upper Extremity Assessment: Overall WFL for tasks assessed   Lower Extremity Assessment Lower Extremity Assessment: Defer to PT evaluation   Cervical / Trunk Assessment Cervical / Trunk Assessment: Other exceptions Cervical / Trunk Exceptions: lumbar corset in place   Communication Communication Communication: No difficulties   Cognition Arousal/Alertness: Awake/alert Behavior During Therapy: WFL for tasks assessed/performed Overall Cognitive Status: Within Functional Limits for tasks  assessed                     General Comments       Exercises       Shoulder Instructions      Home Living Family/patient expects to be discharged to:: Private residence Living Arrangements: Spouse/significant  other Available Help at Discharge: Family;Available 24 hours/day (daughter and then niece will be here for about 10 days to he) Type of Home: House Home Access: Stairs to enter Entergy CorporationEntrance Stairs-Number of Steps: 2 (A brick up to porch and then a brick up into house)   Home Layout: Multi-level;Able to live on main level with bedroom/bathroom     Bathroom Shower/Tub: Producer, television/film/videoWalk-in shower   Bathroom Toilet: Handicapped height     Home Equipment: Grab bars - tub/shower;Shower seat          Prior Functioning/Environment Level of Independence: Independent        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        OT Problem List: Impaired balance (sitting and/or standing);Decreased knowledge of precautions;Decreased knowledge of use of DME or AE;Pain   OT Treatment/Interventions: Self-care/ADL training;DME and/or AE instruction;Therapeutic activities;Patient/family education;Balance training    OT Goals(Current goals can be found in the care plan section) Acute Rehab OT Goals Patient Stated Goal: home with rehab OT Goal Formulation: With patient Time For Goal Achievement: 02/18/16 Potential to Achieve Goals: Good ADL Goals Pt Will Perform Lower Body Bathing: with min guard assist;sit to/from stand;with adaptive equipment Pt Will Perform Lower Body Dressing: with min guard assist;with adaptive equipment;sit to/from stand Pt Will Transfer to Toilet: with supervision;ambulating Pt Will Perform Toileting - Clothing Manipulation and hygiene: with min guard assist;with min assist;with adaptive equipment;sit to/from stand Pt Will Perform Tub/Shower Transfer: Shower transfer;with supervision;ambulating;3 in 1;rolling walker;shower seat Additional ADL Goal #1: Pt will complete bed mobility at mod I level to prepare for OOB ADLs.   OT Frequency: Min 2X/week   Barriers to D/C:            Co-evaluation              End of Session Equipment Utilized During Treatment: Rolling walker;Back brace;Gait  belt Nurse Communication: Other (comment) (bleeding from dressing)  Activity Tolerance: Patient tolerated treatment well Patient left: in bed;with call bell/phone within reach;with bed alarm set;with family/visitor present;with SCD's reapplied   Time: 4098-11911408-1435 OT Time Calculation (min): 27 min Charges:  OT General Charges $OT Visit: 1 Procedure OT Evaluation $OT Eval Low Complexity: 1 Procedure OT Treatments $Self Care/Home Management : 8-22 mins G-Codes: OT G-codes **NOT FOR INPATIENT CLASS** Functional Assessment Tool Used: clinical judgement Functional Limitation: Self care Self Care Current Status (Y7829(G8987): At least 1 percent but less than 20 percent impaired, limited or restricted Self Care Goal Status (F6213(G8988): At least 1 percent but less than 20 percent impaired, limited or restricted  Pilar GrammesMathews, Laxmi Choung H 02/11/2016, 3:09 PM

## 2016-02-11 NOTE — Progress Notes (Signed)
Nursing student notified nurse of change in patient's status from her day's norm. Heart rate assessed as lower than usual and blood pressure soft.  Pt complaining of sudden onset throbbing headache in middle of forehead.  Notified Dr. Jeral FruitBotero at his office who states he will consult hospitalist.  12-lead EKG obtained and rapid response RN arrived to assess. Pt headache pain resolved and blood pressure normalized.  EKG shows bradycardia with PACs which is the same as EKG obtained presurgery.  Patient calming down from the fearful events and family nearby.  Spoke with Dr. Melynda RippleHobbs, hospitalist and updated on patient's current status.  Dr. Melynda RippleHobbs will assess this evening. Lawson RadarHeather M Helen Cuff

## 2016-02-11 NOTE — Progress Notes (Signed)
Inpatient Diabetes Program Recommendations  AACE/ADA: New Consensus Statement on Inpatient Glycemic Control (2015)  Target Ranges:  Prepandial:   less than 140 mg/dL      Peak postprandial:   less than 180 mg/dL (1-2 hours)      Critically ill patients:  140 - 180 mg/dL   Lab Results  Component Value Date   GLUCAP 132 (H) 02/11/2016   HGBA1C 7.0 (H) 02/08/2016    Review of Glycemic Control  Results for Cynthia Wood, Cynthia Wood (MRN [id number]) as of 02/11/2016 08:12  Ref. Range 02/10/2016 09:11 02/10/2016 14:14 02/10/2016 21:48 02/11/2016 06:47  Glucose-Capillary Latest Ref Range: 65 - 99 mg/dL 409141 (H) 811144 (H) 914216 (H) 132 (H)    Diabetes history: Type 2 Outpatient Diabetes medications: Glucophage 500mg  qam  Current orders for Inpatient glycemic control: Metformin 500mg  qam, Novolog 0-15 units tid, Novolog 0-5 units qhs  Inpatient Diabetes Program Recommendations:   Agree with current medications for blood sugar management.    Susette RacerJulie Xochitl Egle, RN, BA, MHA, CDE Diabetes Coordinator Inpatient Diabetes Program  [phone] (Team Pager) [phone] [facility] Office) 02/11/2016 8:18 AM

## 2016-02-11 NOTE — Progress Notes (Signed)
PT Cancellation Note  Patient Details Name: Cynthia Wood MRN: [id number] DOB: 1940/05/16   Cancelled Treatment:    Reason Eval/Treat Not Completed: Patient not medically ready. Pt's brace has not arrived.  Will check back.   Roberto Romanoski LUBECK 02/11/2016, 8:57 AM

## 2016-02-12 DIAGNOSIS — E1169 Type 2 diabetes mellitus with other specified complication: Secondary | ICD-10-CM | POA: Diagnosis not present

## 2016-02-12 DIAGNOSIS — M48061 Spinal stenosis, lumbar region without neurogenic claudication: Principal | ICD-10-CM

## 2016-02-12 DIAGNOSIS — M545 Low back pain: Secondary | ICD-10-CM | POA: Diagnosis not present

## 2016-02-12 DIAGNOSIS — E785 Hyperlipidemia, unspecified: Secondary | ICD-10-CM

## 2016-02-12 DIAGNOSIS — I10 Essential (primary) hypertension: Secondary | ICD-10-CM | POA: Diagnosis not present

## 2016-02-12 LAB — CBC WITH DIFFERENTIAL/PLATELET
BASOS ABS: 0 10*3/uL (ref 0.0–0.1)
Basophils Relative: 1 %
Eosinophils Absolute: 0.3 10*3/uL (ref 0.0–0.7)
Eosinophils Relative: 3 %
HEMATOCRIT: 32.3 % — AB (ref 36.0–46.0)
Hemoglobin: 10.7 g/dL — ABNORMAL LOW (ref 12.0–15.0)
LYMPHS PCT: 24 %
Lymphs Abs: 2 10*3/uL (ref 0.7–4.0)
MCH: 30.8 pg (ref 26.0–34.0)
MCHC: 33.1 g/dL (ref 30.0–36.0)
MCV: 93.1 fL (ref 78.0–100.0)
MONO ABS: 0.6 10*3/uL (ref 0.1–1.0)
MONOS PCT: 7 %
NEUTROS ABS: 5.5 10*3/uL (ref 1.7–7.7)
Neutrophils Relative %: 65 %
Platelets: 192 10*3/uL (ref 150–400)
RBC: 3.47 MIL/uL — ABNORMAL LOW (ref 3.87–5.11)
RDW: 12.9 % (ref 11.5–15.5)
WBC: 8.4 10*3/uL (ref 4.0–10.5)

## 2016-02-12 LAB — GLUCOSE, CAPILLARY
GLUCOSE-CAPILLARY: 110 mg/dL — AB (ref 65–99)
GLUCOSE-CAPILLARY: 146 mg/dL — AB (ref 65–99)

## 2016-02-12 LAB — BASIC METABOLIC PANEL
ANION GAP: 9 (ref 5–15)
BUN: 7 mg/dL (ref 6–20)
CALCIUM: 9.8 mg/dL (ref 8.9–10.3)
CHLORIDE: 106 mmol/L (ref 101–111)
CO2: 25 mmol/L (ref 22–32)
Creatinine, Ser: 0.95 mg/dL (ref 0.44–1.00)
GFR calc Af Amer: >60 mL/min (ref >60)
GFR calc non Af Amer: 56 mL/min — ABNORMAL LOW (ref >60)
GLUCOSE: 188 mg/dL — AB (ref 65–99)
POTASSIUM: 4 mmol/L (ref 3.5–5.1)
Sodium: 140 mmol/L (ref 135–145)

## 2016-02-12 NOTE — Care Management Note (Signed)
Case Management Note  Patient Details  Name: Cynthia Wood MRN: [id number] Date of Birth: 26-Aug-1940  Subjective/Objective:                    Action/Plan: [facility]. No further needs per CM.   Expected Discharge Date:                  Expected Discharge Plan:  Home w Home Health Services  In-House Referral:     Discharge planning Services  CM Consult  Post Acute Care Choice:  Durable Medical Equipment, Home Health Choice offered to:  Patient  DME Arranged:  3-N-1, Walker rolling DME Agency:  [facility]  HH Arranged:  PT, OT, Nurse's Aide HH Agency[facility]  [facility]  Status of Service:  Completed, signed off  If discussed at Long Length of Stay Meetings, dates discussed:    Additional Comments:  Kermit BaloKelli F Maisha Bogen, RN 02/12/2016, 12:54 PM

## 2016-02-12 NOTE — Discharge Summary (Signed)
Physician Discharge Summary  Patient ID: Desiree Lucyriscilla Woodring MRN: [id number] DOB/AGE: 482-22-1942 75 y.o.  Admit date: 02/10/2016 Discharge date: 02/12/2016  Admission Diagnoses:lumbar stenosis with right chronic radiculopathy  Discharge Diagnoses:  Active Problems:   Lumbar canal stenosis   Discharged Condition:incisional pain. No leg pain  Hospital Course: surgery  Consults: hospitalist service secondary to cardiac arrythmia  Significant Diagnostic Studies: mri    ekg  Treatments: right lumbar decpmpression 45,51  Discharge Exam: Blood pressure (!) 119/47, pulse 67, temperature 99 F (37.2 C), temperature source Oral, resp. rate 18, height 5\' 9"  (1.753 m), weight 82.8 kg (182 lb 8.7 oz), SpO2 98 %. No weakness,no headache. Vs  Wnl. Wound dry  Disposition: home at the care of family.  To see me in 2 weeks      Signed: Hagop Mccollam M 02/12/2016, 10:58 AM

## 2016-02-12 NOTE — Progress Notes (Signed)
Patient ID: Cynthia Wood, female   DOB: 11/16/1940, 75 y.o.   MRN: [id number]  PROGRESS NOTE    Cynthia Wood  WNU:[id number] DOB: 11/16/1940 DOA: 02/10/2016  PCP: Kathlee NationsEASON,PAUL, MD   Brief Narrative:   75 y.o. female with pmhx significant for CAD, HTN. Pt was admitted for lumbar surgery. Pt had sudden onset of upper back pain 10/12. Rapid response called.. The 12 lead EKG showed PACs. Then pt's sx spontaneously resolved.  Assessment & Plan:   Active Problems: Lumbar canal stenosis - Per surgery   Rapid response - No acute findings on EKG - Pt feels better this am - CXR without acute cardiopulmonary findings, she does have mild cardiomegaly and congestion - Stable respiratory status   Diabetes mellitus without complications without long term insulin use - Continue metformin   Essential hypertension - Continue imdur, metoprolol  Dyslipidemia associated with type 2 DM - Continue Mevacor    DVT prophylaxis: SCD's bilaterally  Code Status: full code  Family Communication: no family at the bedside this am  Disposition Plan: per primary team; [facility] will sign off, for questions or concenrs please call [phone] or pager# [phone] Manson Passeylma [facility]alya [facility]   Subjective: Feels better this am.   Objective: Vitals:   02/11/16 1753 02/11/16 2200 02/12/16 0108 02/12/16 0532  BP: (!) 145/55 (!) 136/46 (!) 124/43 (!) 134/44  Pulse: 89 (!) 52 60 73  Resp: 18 18 18 18   Temp: 97.5 F (36.4 C) 99 F (37.2 C) 98.2 F (36.8 C) 98.8 F (37.1 C)  TempSrc: Oral Oral Oral Oral  SpO2: (!) 10% 100% 98% 98%  Weight:      Height:        Intake/Output Summary (Last 24 hours) at 02/12/16 1014 Last data filed at 02/11/16 2000  Gross per 24 hour  Intake             1050 ml  Output                0 ml  Net             1050 ml   Filed Weights   02/10/16 2204  Weight: 82.8 kg (182 lb 8.7 oz)    Examination:  General exam: Appears calm and comfortable  Respiratory system: Clear  to auscultation. Respiratory effort normal. Cardiovascular system: S1 & S2 heard, Rate controlled  Gastrointestinal system: Abdomen is nondistended, soft and nontender. No organomegaly or masses felt. Normal bowel sounds heard. Central nervous system: Alert and oriented. No focal neurological deficits. Extremities: Symmetric 5 x 5 power. Skin: No rashes, lesions or ulcers Psychiatry: Judgement and insight appear normal. Mood & affect appropriate.   Data Reviewed: I have personally reviewed following labs and imaging studies  CBC:  Recent Labs Lab 02/08/16 1403 02/11/16 2326 02/12/16 0855  WBC 7.2 8.4 8.4  NEUTROABS  --  5.7 5.5  HGB 12.4 10.3* 10.7*  HCT 37.6 31.4* 32.3*  MCV 94.0 91.8 93.1  PLT 211 167 192   Basic Metabolic Panel:  Recent Labs Lab 02/08/16 1403 02/11/16 2140 02/12/16 0855  NA 140 137 140  K 4.4 4.2 4.0  CL 109 103 106  CO2 24 24 25   GLUCOSE 110* 152* 188*  BUN 19 12 7   CREATININE 1.04* 1.03* 0.95  CALCIUM 10.1 9.4 9.8  MG  --  1.8  --   PHOS  --  2.7  --    GFR: Estimated Creatinine Clearance: 57 mL/min (by C-G formula based on SCr  of 0.95 mg/dL). Liver Function Tests:  Recent Labs Lab 02/11/16 2140  AST 25  ALT 12*  ALKPHOS 58  BILITOT 0.5  PROT 6.3*  ALBUMIN 3.2*   No results for input(s): LIPASE, AMYLASE in the last 168 hours. No results for input(s): AMMONIA in the last 168 hours. Coagulation Profile: No results for input(s): INR, PROTIME in the last 168 hours. Cardiac Enzymes: No results for input(s): CKTOTAL, CKMB, CKMBINDEX, TROPONINI in the last 168 hours. BNP (last 3 results) No results for input(s): PROBNP in the last 8760 hours. HbA1C: No results for input(s): HGBA1C in the last 72 hours. CBG:  Recent Labs Lab 02/11/16 0647 02/11/16 1204 02/11/16 1627 02/11/16 2205 02/12/16 0605  GLUCAP 132* 117* 119* 155* 110*   Lipid Profile: No results for input(s): CHOL, HDL, LDLCALC, TRIG, CHOLHDL, LDLDIRECT in the last 72  hours. Thyroid Function Tests: No results for input(s): TSH, T4TOTAL, FREET4, T3FREE, THYROIDAB in the last 72 hours. Anemia Panel: No results for input(s): VITAMINB12, FOLATE, FERRITIN, TIBC, IRON, RETICCTPCT in the last 72 hours. Urine analysis: No results found for: COLORURINE, APPEARANCEUR, LABSPEC, PHURINE, GLUCOSEU, HGBUR, BILIRUBINUR, KETONESUR, PROTEINUR, UROBILINOGEN, NITRITE, LEUKOCYTESUR Sepsis Labs: @LABRCNTIP (procalcitonin:4,lacticidven:4)    Recent Results (from the past 240 hour(s))  Surgical pcr screen     Status: None   Collection Time: 02/08/16  2:23 PM  Result Value Ref Range Status   MRSA, PCR NEGATIVE NEGATIVE Final   Staphylococcus aureus NEGATIVE NEGATIVE Final    Comment:        The Xpert SA Assay (FDA approved for NASAL specimens in patients over 100 years of age), is one component of a comprehensive surveillance program.  Test performance has been validated by [facility] for patients greater than or equal to 8 year old. It is not intended to diagnose infection nor to guide or monitor treatment.       Radiology Studies: Dg Lumbar Spine 2-3 Views  Result Date: 02/10/2016 CLINICAL DATA:  Lumbar laminectomy EXAM: LUMBAR SPINE - 2 VIEW COMPARISON:  01/13/2016 FINDINGS: Two lateral plain films were obtained of the lumbar spine intraoperatively. The initial image demonstrates a surgical retractor at the L3-4 level posteriorly with surgical instrument extending towards the posterior aspect of the L3 vertebral body. Subsequent film shows surgical retractor at the L4 level with instruments posterior to the L4 and L5 vertebral bodies. IMPRESSION: Intraoperative localization as described Electronically Signed   By: Alcide Clever M.D.   On: 02/10/2016 14:37   Dg Chest Port 1 View  Result Date: 02/11/2016 CLINICAL DATA:  Upper back pain between shoulder blades EXAM: PORTABLE CHEST 1 VIEW COMPARISON:  None. FINDINGS: AP portable semi-erect view of the chest.  Median sternotomy wires and surgical clips are present. Calcified vessel versus cardiac stent material on the left. Postsurgical changes of the proximal right humerus. There is mild cardiomegaly. There is mild central vascular congestion. There is streaky atelectasis at the left base. There is no pleural effusion. No pneumothorax. There is atherosclerosis of the aorta. Surgical clips in the right upper quadrant. IMPRESSION: 1. Mild cardiomegaly with mild central congestion. No acute consolidation or overt edema. 2. Streaky atelectasis in the left lung base. 3. Atherosclerotic vascular disease of the aorta. Electronically Signed   By: Jasmine Pang M.D.   On: 02/11/2016 23:18      Scheduled Meds: . aspirin EC  81 mg Oral Daily  . buPROPion  300 mg Oral Daily  . docusate sodium  100 mg Oral BID  .  furosemide  40 mg Oral QODAY  . gabapentin  100 mg Oral BID  . insulin aspart  0-15 Units Subcutaneous TID WC  . insulin aspart  0-5 Units Subcutaneous QHS  . isosorbide mononitrate  30 mg Oral Daily  . loratadine  10 mg Oral Daily  . mouth rinse  15 mL Mouth Rinse BID  . metFORMIN  500 mg Oral Q breakfast  . metoprolol tartrate  12.5 mg Oral BID  . pravastatin  10 mg Oral q1800  . sodium chloride flush  3 mL Intravenous Q12H  . zolpidem  5 mg Oral QHS   Continuous Infusions: . sodium chloride    . sodium chloride Stopped (02/11/16 2000)  . lactated ringers Stopped (02/11/16 0201)     LOS: 1 day    Time spent: 15 minutes Greater than 50% of the time spent on counseling and coordinating the care.   Manson Passey, MD [facility] Pager [phone]  If 7PM-7AM, please contact night-coverage [email or web address] Password [email or web address] 02/12/2016, 10:14 AM

## 2016-02-12 NOTE — Progress Notes (Signed)
Physical Therapy Treatment Patient Details Name: Cynthia Wood MRN: [id number] DOB: May 24, 1940 Today's Date: 02/12/2016    History of Present Illness 75 y/o female s/p R l4 & L5 hemilaminectomy and decompression L4, L5 and S1 nerve roots (02/10/16) PMH : HTn, GERD, DM, TKA    PT Comments    Progressing well.  Emphasis on transitions to/from sidelying, gait training and education.  Follow Up Recommendations  Home health PT     Equipment Recommendations  Rolling walker with 5" wheels;3in1 (PT)    Recommendations for Other Services       Precautions / Restrictions Precautions Precautions: Back Precaution Booklet Issued: No Precaution Comments: Pt able to recall 3/3 back precautions and maintain during functional activities. Required Braces or Orthoses: Spinal Brace Spinal Brace: Lumbar corset;Applied in sitting position Restrictions Weight Bearing Restrictions: No    Mobility  Bed Mobility Overal bed mobility: Needs Assistance Bed Mobility: Rolling;Sidelying to Sit;Sit to Sidelying Rolling: Supervision Sidelying to sit: Supervision     Sit to sidelying: Supervision General bed mobility comments: reinforcing technique, pt got to supervision with practice.  Transfers Overall transfer level: Needs assistance Equipment used: Rolling walker (2 wheeled) Transfers: Sit to/from Stand Sit to Stand: Min guard         General transfer comment: Cues for hand placement. Min guard for safety.  Ambulation/Gait Ambulation/Gait assistance: Supervision Ambulation Distance (Feet): 200 Feet Assistive device: Rolling walker (2 wheeled) Gait Pattern/deviations: Step-through pattern Gait velocity: decreased Gait velocity interpretation: at or above normal speed for age/gender General Gait Details: generally steady with slow speed   Stairs            Wheelchair Mobility    Modified Rankin (Stroke Patients Only)       Balance Overall balance assessment: Needs  assistance Sitting-balance support: Feet supported;No upper extremity supported Sitting balance-Leahy Scale: Good     Standing balance support: Bilateral upper extremity supported Standing balance-Leahy Scale: Fair                      Cognition Arousal/Alertness: Awake/alert Behavior During Therapy: WFL for tasks assessed/performed Overall Cognitive Status: Within Functional Limits for tasks assessed                      Exercises      General Comments General comments (skin integrity, edema, etc.): Reviewed all back education with pt and husband      Pertinent Vitals/Pain Pain Assessment: Faces Pain Score: 5  Faces Pain Scale: Hurts even more Pain Location: back Pain Descriptors / Indicators: Aching;Sore;Grimacing Pain Intervention(s): Monitored during session;Repositioned    Home Living                      Prior Function            PT Goals (current goals can now be found in the care plan section) Acute Rehab PT Goals Patient Stated Goal: home today PT Goal Formulation: With patient Time For Goal Achievement: 02/16/16 Potential to Achieve Goals: Good Progress towards PT goals: Progressing toward goals    Frequency    Min 5X/week      PT Plan Current plan remains appropriate    Co-evaluation             End of Session   Activity Tolerance: Patient tolerated treatment well Patient left: in bed;with call bell/phone within reach;with family/visitor present     Time: 4098-1191 PT Time Calculation (min) (ACUTE ONLY):  30 min  Charges:  $Gait Training: 8-22 mins $Therapeutic Activity: 8-22 mins                    G Codes:      [name], Eliseo GumKenneth V 02/12/2016, 2:22 PM 02/12/2016  Mascot BingKen [name], PT [phone]  (pager)

## 2016-02-12 NOTE — Progress Notes (Signed)
Occupational Therapy Treatment Patient Details Name: Cynthia Wood MRN: [id number] DOB: [birth year] Today's Date: 02/12/2016    History of present illness 75 y/o female s/p R l4 & L5 hemilaminectomy and decompression L4, L5 and S1 nerve roots (02/10/16) PMH : HTn, GERD, DM, TKA   OT comments  Pt able to perform functional mobility with min guard assist and don brace with set up. Continues to require verbal cues for proper log roll technique and safety with transfers using RW. Reviewed all back, safety, and ADL education with pt; she verbalized understanding. D/c plan remains appropriate. Will continue to follow acutely.   Follow Up Recommendations  Home health OT;Supervision/Assistance - 24 hour    Equipment Recommendations  3 in 1 bedside comode    Recommendations for Other Services      Precautions / Restrictions Precautions Precautions: Back Precaution Booklet Issued: No Precaution Comments: Pt able to recall 3/3 back precautions and maintain during functional activities. Required Braces or Orthoses: Spinal Brace Spinal Brace: Lumbar corset Restrictions Weight Bearing Restrictions: No       Mobility Bed Mobility Overal bed mobility: Needs Assistance Bed Mobility: Rolling;Sidelying to Sit;Sit to Sidelying Rolling: Supervision Sidelying to sit: Supervision     Sit to sidelying: Supervision General bed mobility comments: No physical assist but cues for log roll technique. HOB flat with use of bed rail.  Transfers Overall transfer level: Needs assistance Equipment used: Rolling walker (2 wheeled) Transfers: Sit to/from Stand Sit to Stand: Min guard         General transfer comment: Cues for hand placement. Min guard for safety.    Balance Overall balance assessment: Needs assistance Sitting-balance support: Feet supported;No upper extremity supported Sitting balance-Leahy Scale: Good     Standing balance support: Bilateral upper extremity  supported Standing balance-Leahy Scale: Fair                     ADL Overall ADL's : Needs assistance/impaired                 Upper Body Dressing : Set up;Sitting Upper Body Dressing Details (indicate cue type and reason): pt able don/doff brace sitting EOB     Toilet Transfer: Min guard;Ambulation;BSC;RW Toilet Transfer Details (indicate cue type and reason): Simulated by sit to stand from EOB with functional mobility in room.         Functional mobility during ADLs: Min guard;Rolling walker General ADL Comments: Reviewed back education, log roll technique. Pt able to recall back precautions and maintain during functional activities.      Vision                     Perception     Praxis      Cognition   Behavior During Therapy: WFL for tasks assessed/performed Overall Cognitive Status: Within Functional Limits for tasks assessed                       Extremity/Trunk Assessment               Exercises     Shoulder Instructions       General Comments      Pertinent Vitals/ Pain       Pain Assessment: 0-10 Pain Score: 5  Pain Location: back Pain Descriptors / Indicators: Aching;Sore Pain Intervention(s): Monitored during session  Home Living  Prior Functioning/Environment              Frequency  Min 2X/week        Progress Toward Goals  OT Goals(current goals can now be found in the care plan section)  Progress towards OT goals: Progressing toward goals  Acute Rehab OT Goals Patient Stated Goal: home today OT Goal Formulation: With patient  Plan Discharge plan remains appropriate    Co-evaluation                 End of Session Equipment Utilized During Treatment: Rolling walker;Back brace   Activity Tolerance Patient tolerated treatment well   Patient Left in bed;with call bell/phone within reach;with bed alarm set   Nurse  Communication Mobility status        Time: [id number] OT Time Calculation (min): 14 min  Charges: OT General Charges $OT Visit: 1 Procedure OT Treatments $Self Care/Home Management : 8-22 mins  Gaye AlkenBailey A Cathleen Yagi M.S., OTR/L Pager: [phone]  02/12/2016, 11:35 AM

## 2016-04-13 DIAGNOSIS — C50919 Malignant neoplasm of unspecified site of unspecified female breast: Secondary | ICD-10-CM | POA: Insufficient documentation

## 2016-04-13 DIAGNOSIS — E782 Mixed hyperlipidemia: Secondary | ICD-10-CM | POA: Insufficient documentation

## 2016-04-13 DIAGNOSIS — I10 Essential (primary) hypertension: Secondary | ICD-10-CM | POA: Insufficient documentation

## 2016-04-13 DIAGNOSIS — E119 Type 2 diabetes mellitus without complications: Secondary | ICD-10-CM | POA: Insufficient documentation

## 2016-04-13 HISTORY — DX: Malignant neoplasm of unspecified site of unspecified female breast: C50.919

## 2017-08-10 DIAGNOSIS — K219 Gastro-esophageal reflux disease without esophagitis: Secondary | ICD-10-CM | POA: Insufficient documentation

## 2017-10-10 ENCOUNTER — Other Ambulatory Visit: Payer: Self-pay

## 2017-10-16 IMAGING — XA DG MYELOGRAPHY LUMBAR INJ LUMBOSACRAL
14 of 19 series · 14 of 19 positions shown · non-contrast
Comparison: MRI lumbar spine 08/13/2015.

CLINICAL DATA: Low back and RIGHT leg pain.  Multiple falls.
TECHNIQUE: Contiguous axial images were obtained through the Lumbar spine after
the intrathecal infusion of infusion. Coronal and sagittal
reconstructions were obtained of the axial image sets.

[Series 1: w lumbar spine ap · 0.15mm/px · 1 of 1 slices shown]
[im 1/1]
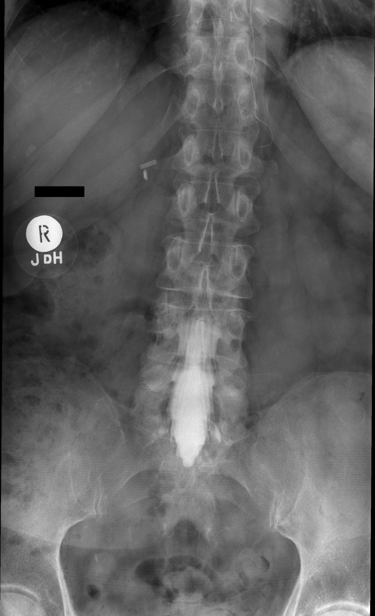

[Series 2: w lumbar spine lat · 0.15mm/px · 1 of 1 slices shown]
[im 1/1]
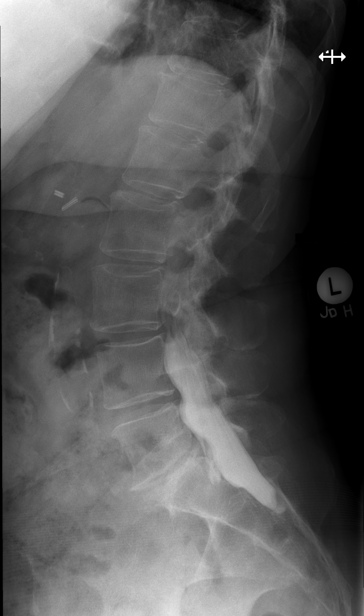

[Series 2: vasc adipose · 1 of 1 slices shown (1 of 10)]
[im 1/1]
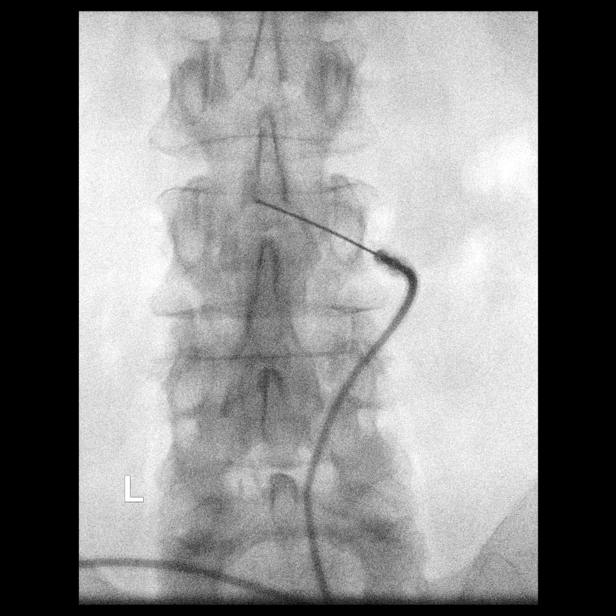

[Series 3: w lumbar spine flexion · 0.15mm/px · 1 of 1 slices shown]
[im 1/1]
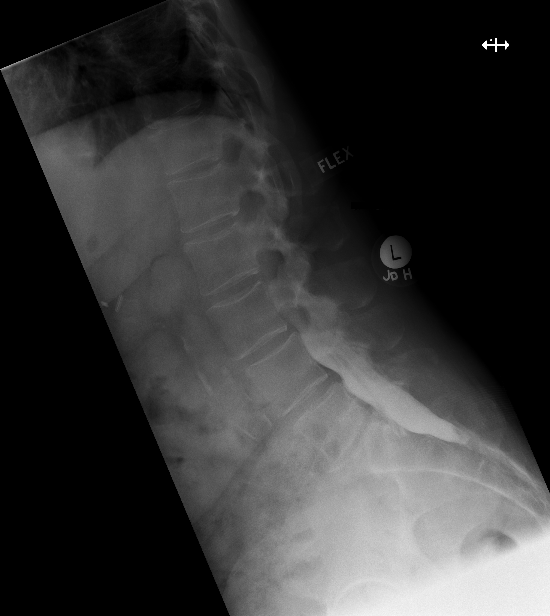

[Series 4: vasc adipose · 1 of 1 slices shown (2 of 10)]
[im 1/1]
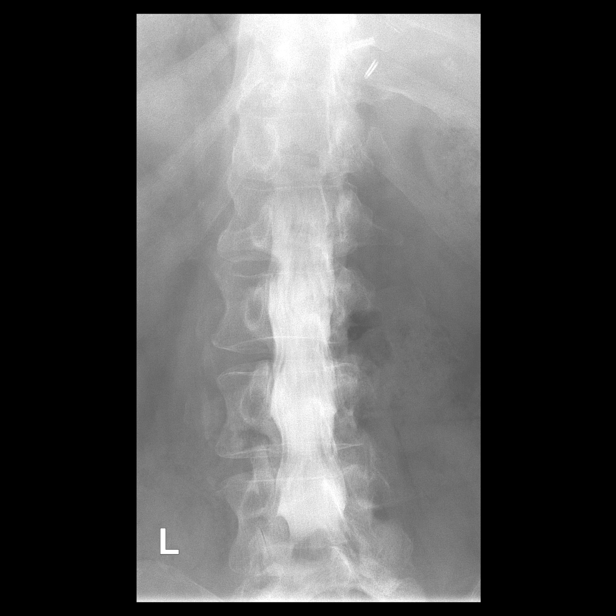

[Series 4: w lumbar spine extension · 0.15mm/px · 1 of 1 slices shown]
[im 1/1]
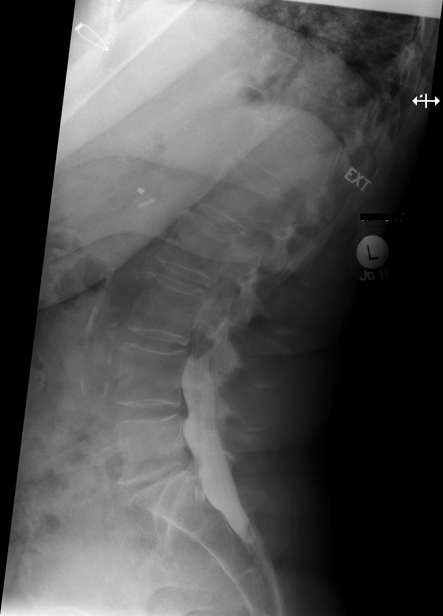

[Series 5: vasc adipose · 1 of 1 slices shown (3 of 10)]
[im 1/1]
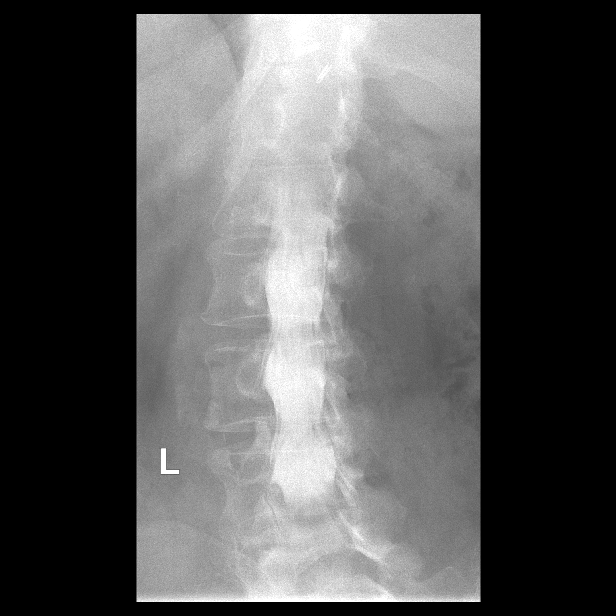

[Series 7: vasc adipose · 1 of 1 slices shown (4 of 10)]
[im 1/1]
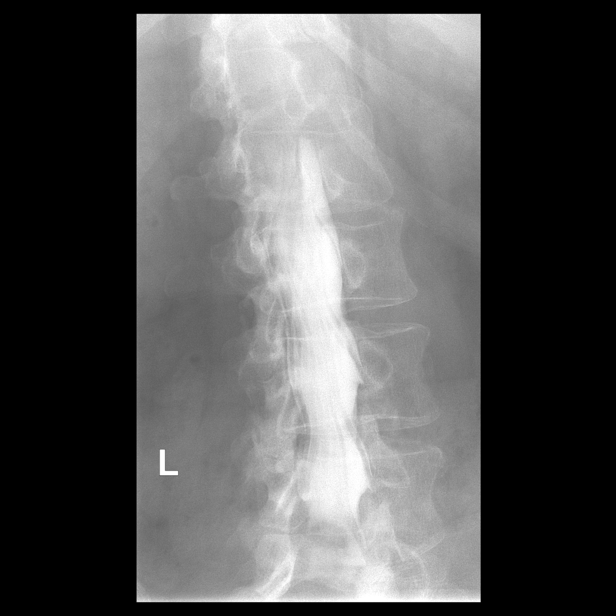

[Series 8: vasc adipose · 1 of 1 slices shown (5 of 10)]
[im 1/1]
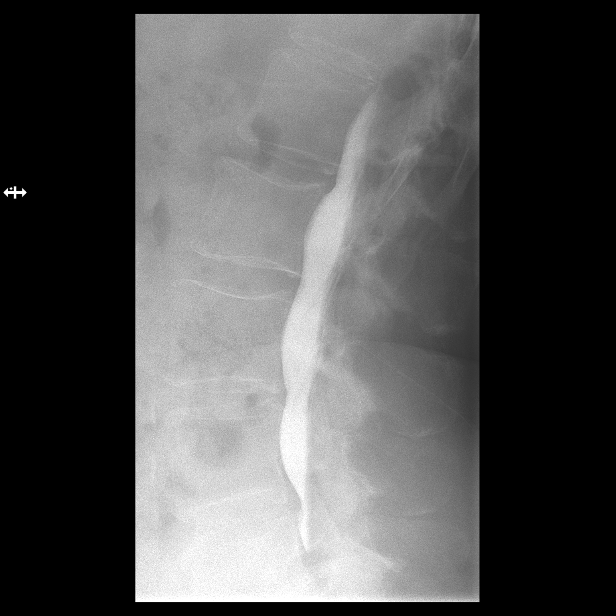

[Series 9: vasc adipose · 1 of 1 slices shown (6 of 10)]
[im 1/1]
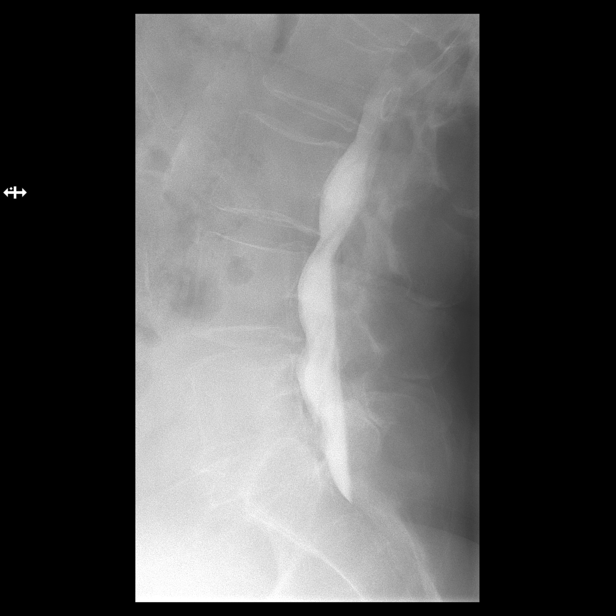

[Series 11: vasc adipose · 1 of 1 slices shown (7 of 10)]
[im 1/1]
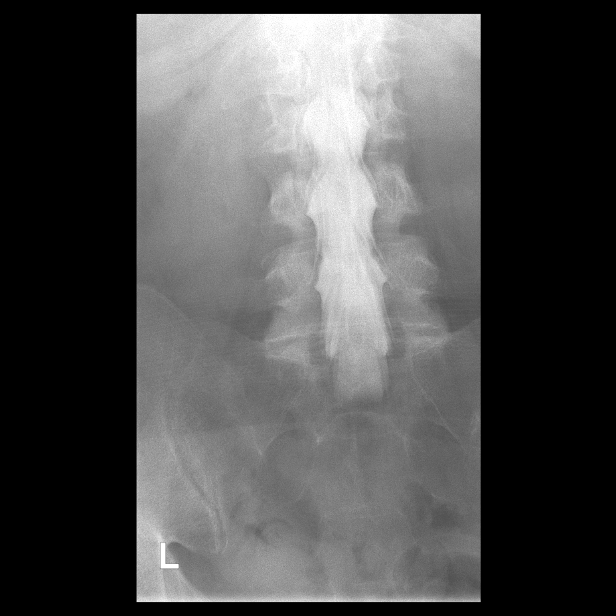

[Series 12: vasc adipose · 1 of 1 slices shown (8 of 10)]
[im 1/1]
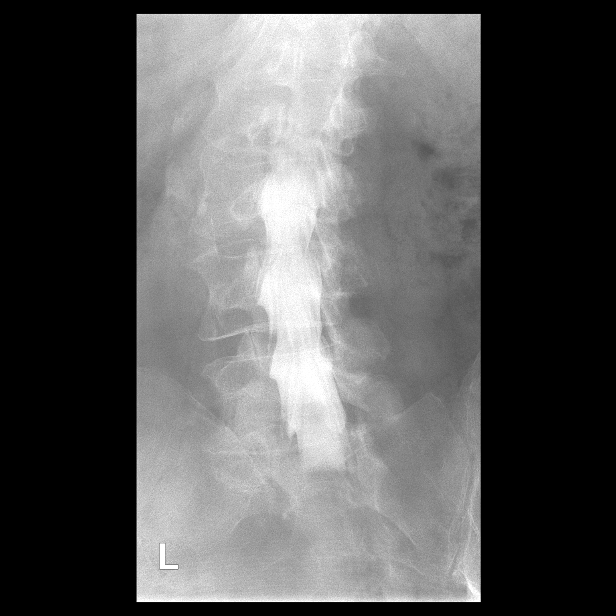

[Series 13: vasc adipose · 1 of 1 slices shown (9 of 10)]
[im 1/1]
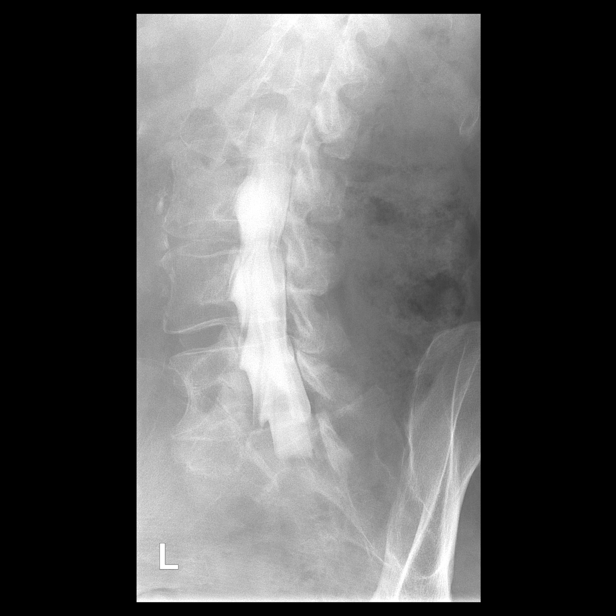

[Series 15: vasc adipose · 1 of 1 slices shown (10 of 10)]
[im 1/1]
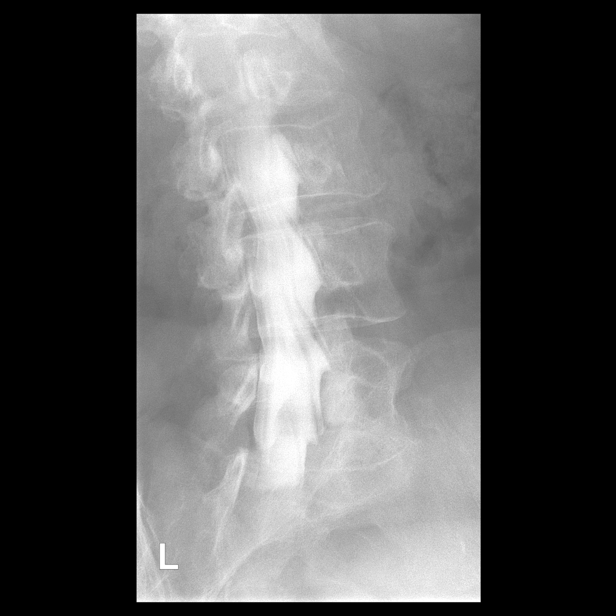

[14 of 19 positions shown; findings below may reference images not displayed]

EXAM:
LUMBAR MYELOGRAM

FLUOROSCOPY TIME:  31 seconds corresponding to a Dose Area Product
of 209.8 ?Gy*m2

PROCEDURE:
After thorough discussion of risks and benefits of the procedure
including bleeding, infection, injury to nerves, blood vessels,
adjacent structures as well as headache and CSF leak, written and
oral informed consent was obtained. Consent was obtained by Dr. Lemon
Ginter. Time out form was completed.

Patient was positioned prone on the fluoroscopy table. Local
anesthesia was provided with 1% lidocaine without epinephrine after
prepped and draped in the usual sterile fashion. Puncture was
performed at L2-L3 using a 3 1/2 inch 22-gauge spinal needle via
midline approach. Using a single pass through the dura, the needle
was placed within the thecal sac, with return of clear CSF. 15 mL of
Isovue-M 200 was injected into the thecal sac, with normal
opacification of the nerve roots and cauda equina consistent with
free flow within the subarachnoid space.

I personally performed the lumbar puncture and administered the
intrathecal contrast. I also personally supervised acquisition of
the myelogram images.
FINDINGS: LUMBAR MYELOGRAM FINDINGS:

Good opacification lumbar subarachnoid space. Moderate ventral
defects at L4-5 and L5-S1. Advanced disc space narrowing L5-S1. No
significant spinal stenosis. There is asymmetric truncation of the
RIGHT S1 nerve root at the L5-S1 level. There is mild BILATERAL
effacement of the L5 nerve roots.

Anatomic alignment both prone and upright. Standing flexion
extension radiographs demonstrate normal alignment without dynamic
instability.

CT LUMBAR MYELOGRAM FINDINGS:

Segmentation: Normal.

Alignment:  Normal.

Vertebrae: Endplate reactive changes L5-S1. Non worrisome sclerotic
density L5.

Conus medullaris: Slightly low termination L2. Normal size and
location.

Paraspinal tissues: Aortic atherosclerosis.

Disc levels:

L1-L2:  Unremarkable.

L2-L3:  Unremarkable.

L3-L4:  Unremarkable.

L4-L5: Central and rightward protrusion. Moderately advanced
BILATERAL facet arthropathy, worse on the RIGHT. Disc material
extends to the foramen. Slight mass effect on both L5 nerve roots.
RIGHT-sided foraminal narrowing could compress the L4 nerve root.
Mass effect on the RIGHT L4 nerve root in the extraforaminal
compartment noted as well.

L5-S1: Central extrusion, partially calcified, mildly eccentric to
the RIGHT. Slight effacement RIGHT S1 nerve root. BILATERAL
foraminal narrowing, worse on the LEFT, could affect the L5 nerve
roots.

Compared with prior MR, similar appearance.
IMPRESSION: LUMBAR MYELOGRAM IMPRESSION:

Asymmetric truncation of the RIGHT S1 nerve root on myelography.
Large ventral defect at L5-S1. Mild effacement of both L5 nerve
roots at the L4-5 level.

No dynamic instability on flexion extension.

CT LUMBAR MYELOGRAM IMPRESSION:

Central extrusion, partially calcified, at L5-S1, results in slight
effacement of the RIGHT S1 nerve root. BILATERAL foraminal
narrowing, associated with bony overgrowth in joint space narrowing,
could affect the LEFT greater than RIGHT L5 nerve roots.

Central and rightward protrusion at L4-5 with moderately advanced
facet arthropathy. RIGHT greater than LEFT L5 and L4 nerve root
impingement is possible.

Aortic atherosclerosis.

## 2017-10-24 ENCOUNTER — Other Ambulatory Visit: Payer: Self-pay

## 2017-11-07 ENCOUNTER — Other Ambulatory Visit: Payer: Self-pay

## 2017-11-13 ENCOUNTER — Other Ambulatory Visit: Payer: Self-pay

## 2017-11-14 IMAGING — CR DG CHEST 1V PORT
1 series · 1 of 1 positions shown · non-contrast
Comparison: None.

CLINICAL DATA: Upper back pain between shoulder blades

EXAM:
PORTABLE CHEST 1 VIEW

[AP]
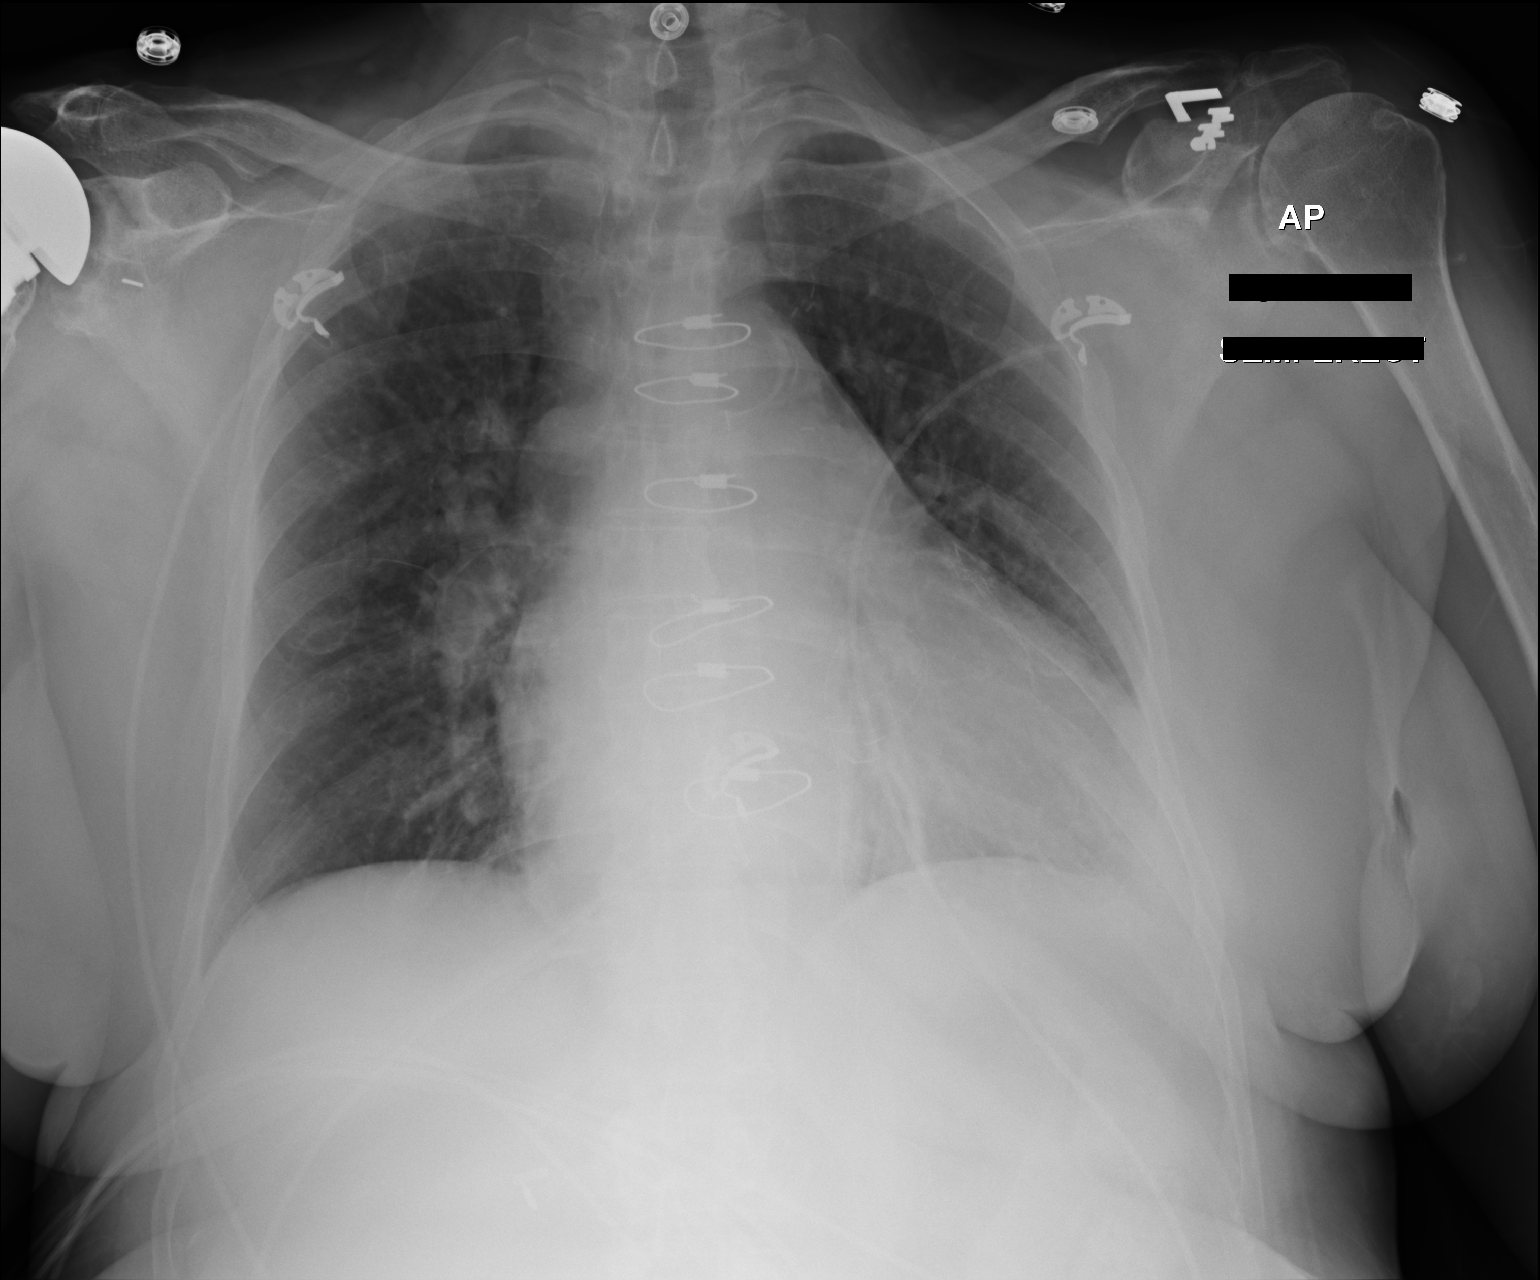

[1 of 1 positions shown; findings below may reference images not displayed]

FINDINGS: AP portable semi-erect view of the chest. Median sternotomy wires
and surgical clips are present. Calcified vessel versus cardiac
stent material on the left. Postsurgical changes of the proximal
right humerus.

There is mild cardiomegaly. There is mild central vascular
congestion. There is streaky atelectasis at the left base. There is
no pleural effusion. No pneumothorax. There is atherosclerosis of
the aorta. Surgical clips in the right upper quadrant.
IMPRESSION: 1. Mild cardiomegaly with mild central congestion. No acute
consolidation or overt edema.
2. Streaky atelectasis in the left lung base.
3. Atherosclerotic vascular disease of the aorta.

## 2018-04-30 ENCOUNTER — Other Ambulatory Visit: Payer: Self-pay | Admitting: Family Medicine

## 2018-05-29 ENCOUNTER — Other Ambulatory Visit: Payer: Self-pay | Admitting: Family Medicine

## 2018-06-01 ENCOUNTER — Other Ambulatory Visit: Payer: Self-pay | Admitting: Medical-Surgical

## 2018-06-19 ENCOUNTER — Other Ambulatory Visit: Payer: Self-pay | Admitting: Family

## 2019-01-31 ENCOUNTER — Other Ambulatory Visit: Payer: Self-pay | Admitting: Orthopaedic Surgery

## 2019-05-07 ENCOUNTER — Other Ambulatory Visit: Payer: Self-pay | Admitting: Family Medicine

## 2019-06-13 ENCOUNTER — Other Ambulatory Visit: Payer: Self-pay | Admitting: Family Medicine

## 2019-06-25 ENCOUNTER — Other Ambulatory Visit: Payer: Self-pay | Admitting: Family Medicine

## 2019-10-16 ENCOUNTER — Other Ambulatory Visit: Payer: Self-pay | Admitting: Medical

## 2019-12-20 ENCOUNTER — Other Ambulatory Visit: Payer: Self-pay | Admitting: Otolaryngology

## 2020-04-01 ENCOUNTER — Other Ambulatory Visit: Payer: Self-pay | Admitting: Neurology

## 2020-05-12 DIAGNOSIS — I779 Disorder of arteries and arterioles, unspecified: Secondary | ICD-10-CM | POA: Insufficient documentation

## 2020-05-12 DIAGNOSIS — D649 Anemia, unspecified: Secondary | ICD-10-CM | POA: Insufficient documentation

## 2020-05-12 DIAGNOSIS — W19XXXA Unspecified fall, initial encounter: Secondary | ICD-10-CM | POA: Insufficient documentation

## 2020-10-23 ENCOUNTER — Encounter (INDEPENDENT_AMBULATORY_CARE_PROVIDER_SITE_OTHER): Payer: Self-pay | Admitting: Internal Medicine

## 2020-10-23 ENCOUNTER — Ambulatory Visit (INDEPENDENT_AMBULATORY_CARE_PROVIDER_SITE_OTHER): Payer: Medicare Other | Admitting: Internal Medicine

## 2020-10-23 VITALS — BP 170/60 | HR 81 | Resp 18 | Ht 69.0 in | Wt 182.4 lb

## 2020-10-23 DIAGNOSIS — R42 Dizziness and giddiness: Secondary | ICD-10-CM

## 2020-10-23 DIAGNOSIS — I779 Disorder of arteries and arterioles, unspecified: Secondary | ICD-10-CM

## 2020-10-23 DIAGNOSIS — I2583 Coronary atherosclerosis due to lipid rich plaque: Secondary | ICD-10-CM

## 2020-10-23 DIAGNOSIS — I2510 Atherosclerotic heart disease of native coronary artery without angina pectoris: Secondary | ICD-10-CM

## 2020-10-23 LAB — ECG 12-LEAD
Atrial Rate: 67 {beats}/min
P Axis: 10 degrees
P-R Interval: 234 ms
Q-T Interval: 380 ms
QRS Duration: 88 ms
QTC Calculation (Bezet): 401 ms
R Axis: 83 degrees
T Axis: 12 degrees
Ventricular Rate: 67 {beats}/min

## 2020-10-23 NOTE — Progress Notes (Signed)
[facility] [place] [place] OFFICE CONSULTATION    I had the pleasure of seeing [name] today for cardiovascular evaluation. She is a pleasant 80 y.o. female with a history of CABG 3V 2002 IFH, stents [facility] - [facility],  HTN, DM, CVA, carotid artery stenosis who presents for establishment of care.  Patient now resides in [place].  Previously followed with cardiologist in [facility].  She has no complaints this visit.  She denies chest pain, shortness of breath, palpitations, syncope.  Occasional episodes of lightheadedness/dizziness.  She had a syncopal episode earlier this year and was admitted to the hospital.  She reports having a tilt table test and was diagnosed with orthostatic hypotension.  She wears compression stockings and she attempts to keep well-hydrated.  No recent syncopal episodes.  Blood pressure has been elevated.  She takes candesartan only 3 days a week.  She follows a low-sodium diet.  She denies smoking and alcohol.  Patient is currently undergoing physical therapy for balance issues.      Cardiologist Darleen Crocker MD -[facility] - last     PAST MEDICAL HISTORY: She has no past medical history on file. She has no past surgical history on file.        MEDICATIONS:   Current Outpatient Medications   Medication Sig Dispense Refill    acetaminophen (TYLENOL) 325 MG tablet Take 650 mg by mouth      aspirin EC 81 MG EC tablet Take 81 mg by mouth daily      calcium carbonate (TUMS) 500 MG chewable tablet Chew 1 tablet by mouth daily      candesartan (ATACAND) 4 MG tablet Take 4 mg by mouth daily      Dulaglutide (Trulicity) 0.75 MG/0.5ML Solution Pen-injector Inject into the skin      fexofenadine (ALLEGRA) 180 MG tablet Take 180 mg by mouth daily      fluticasone (FLONASE) 50 MCG/ACT nasal spray 1 spray by Nasal route daily      furosemide (LASIX) 40 MG tablet Take 40 mg by mouth 2 (two) times daily      lovastatin (MEVACOR) 40 MG tablet Take 40  mg by mouth nightly      multivitamin-iron-minerals-folic acid (CENTRUM) chewable tablet Chew 1 tablet by mouth daily      pantoprazole (PROTONIX) 40 MG tablet Take 40 mg by mouth daily      ranolazine (RANEXA) 500 MG 12 hr tablet Take 500 mg by mouth 2 (two) times daily      sertraline (ZOLOFT) 25 MG tablet Take 25 mg by mouth daily      traMADol (ULTRAM) 50 MG tablet Take 50 mg by mouth every 6 (six) hours as needed for Pain      vitamin D (CHOLECALCIFEROL) 25 MCG (1000 UT) tablet Take by mouth daily      zolpidem (AMBIEN CR) 12.5 MG CR tablet Take 5 mg by mouth nightly as needed for Sleep       No current facility-administered medications for this visit.           ALLERGIES:   Allergies   Allergen Reactions    Fentanyl Anaphylaxis     Hives    Percolone [Oxycodone]          FAMILY HISTORY: Her family history includes Heart failure in her mother; Myocardial Infarction in her father.      SOCIAL HISTORY: She reports that she quit smoking about 32 years ago. Her  smoking use included cigarettes. She has never used smokeless tobacco. She reports that she does not currently use alcohol. She reports that she does not use drugs.      REVIEW OF SYSTEMS: All other systems reviewed and negative except as above.         PHYSICAL EXAMINATION  General Appearance:  A well-appearing female in no acute distress.    Vital Signs: BP 170/60 (BP Site: Left arm, Patient Position: Sitting, Cuff Size: Medium)   Pulse 81   Resp 18   Ht 1.753 m (5\' 9" )   Wt 82.7 kg (182 lb 6.4 oz)   SpO2 98%   BMI 26.94 kg/m    HEENT: Sclera anicteric, conjunctiva without pallor, moist mucous membranes, normal dentition. No arcus.   Neck:  Supple without jugular venous distention. Thyroid nonpalpable. Normal carotid upstrokes without bruits.   Chest: Clear to auscultation bilaterally with good air movement and respiratory effort and no wheezes, rales, or rhonchi   Cardiovascular: Normal S1 and physiologically split S2 without murmurs, gallops or  rub. PMI of normal size and nondisplaced.   Abdomen: Soft, nontender, nondistended, with normoactive bowel sounds. No organomegaly.  No pulsatile masses, or bruits.   Extremities: Warm without significant edema.  Skin: No rash, xanthoma or xanthelasma.   Neuro: Alert and oriented x3. Grossly intact. Strength is symmetrical. Normal mood and affect.           ECG: Today, I have independently reviewed the tracing, sinus rhythm, no significant ST-T changes.          LABS: Reviewed in epic          PET myocardial perfusion imaging 01/27/2020 - [facility]. Small sized area of ischemia in the apical wall. EF 85%, no RWMA    Carotid artery Dopplers 05/11/2020      Findings:  The right and left side common carotid arteries, bifurcations, carotid bulbs as well as the internal and external carotid arteries show no evidence of obstruction or significant stenosis. Peak systolic flow velocities are within the normal range with normal-appearing Doppler waveforms.     Right common artery: Peak systolic velocity 184 cm/s     Right internal carotid artery: Peak systolic velocity 142 cm/s     Left common carotid artery: Peak systolic velocity 144 cm/s     Left internal carotid artery: Peak systolic velocity 165 cm/s     The right ICA/CCA ratio is 0.9     The left ICA/CCA ratio is 1.3   Atherosclerotic plaque is seen of the carotid bulbs bilaterally.   Vertebral artery flow is antegrade bilaterally       IMPRESSION:   1. Atherosclerotic disease, bilateral 50-69% stenosis internal carotid arteries.      IMPRESSION/RECOMMENDATIONS:     CAD status post CABG 2002, status post stents 2006 -stable, asymptomatic.  She reports cardiac catheterization in 2019, she was to have a stent at that time however the stent could not be placed.  Will request cardiac catheterization report from previous cardiologist.  Mild apical ischemia on PET myocardial fusion scan 01/2020.  Continue with aspirin, statin, Ranexa.  She had lab work today, she will have  copies sent to [place].  We will get an echocardiogram.    Hypertension -uncontrolled, will increase candesartan to 40 mg p.o. daily.  Advised low-sodium diet.    Prior history of CVA    Prior history of orthostatic hypotension -currently asymptomatic.  Continue with TED hose, advised to keep hydrated.  Diabetes -Per endocrinology/PCP.    Carotid artery disease -patient was following with vascular surgery, will refer to vascular surgery within [place].      35 minutes spent. Medical records reviewed.       Rich Number, MD  10/23/2020, 3:17 PM  [facility]  [phone]

## 2020-10-30 ENCOUNTER — Encounter (INDEPENDENT_AMBULATORY_CARE_PROVIDER_SITE_OTHER): Payer: Self-pay

## 2020-11-23 ENCOUNTER — Encounter (INDEPENDENT_AMBULATORY_CARE_PROVIDER_SITE_OTHER): Payer: Self-pay

## 2020-11-23 NOTE — Progress Notes (Signed)
Appt Date/Time: 12/04/20 @  9:45 am      80 y.o. female   PCP: Opaigbeogu, Alma Downs, MD  Referring Physician: Rich Number MD        Appointment Type: New patient  Imaging:  Carotid US 05/11/20 in Dr. Celene Skeen notes    Chief Complaint/HPI: Consult Carotid stenosis    Date of Last Visit: n/a     Allergies:   Allergies   Allergen Reactions    Fentanyl Anaphylaxis     Hives    Percolone [Oxycodone]          Selected Medications:  Aspirin and Insulin    Focused Prior Medical History:  n/a             Smoker     X      Former Smoker          Never Smoker      Vital Signs this Visit Pulses  HT  BP R BP L Temp   Rad Ulnar Brach Fem Pop DP PT        R          WT  Pulse R Pulse L SpO2       L          Imaging results:        Plan of Care:

## 2020-11-24 ENCOUNTER — Other Ambulatory Visit: Payer: Self-pay | Admitting: Physician Assistant

## 2020-11-24 DIAGNOSIS — M5416 Radiculopathy, lumbar region: Secondary | ICD-10-CM

## 2020-12-01 ENCOUNTER — Ambulatory Visit (INDEPENDENT_AMBULATORY_CARE_PROVIDER_SITE_OTHER): Payer: Medicare Other

## 2020-12-01 DIAGNOSIS — R42 Dizziness and giddiness: Secondary | ICD-10-CM

## 2020-12-01 DIAGNOSIS — I2510 Atherosclerotic heart disease of native coronary artery without angina pectoris: Secondary | ICD-10-CM

## 2020-12-01 DIAGNOSIS — I2583 Coronary atherosclerosis due to lipid rich plaque: Secondary | ICD-10-CM

## 2020-12-01 LAB — ECHOCARDIOGRAM ADULT COMPLETE W CLR/ DOPP WAVEFORM
AV Area (Cont Eq VTI): 1.639
AV Area (Cont Eq VTI): 1.7
AV Mean Gradient: 12
AV Mean Gradient: 12
AV Mean Gradient: 6
AV Peak Velocity: 152
AV Peak Velocity: 235
AV Peak Velocity: 241
IVS Diastolic Thickness (2D): 0.029
IVS Diastolic Thickness (2D): 1.17
IVS Diastolic Thickness (2D): 1.17
LA Dimension (2D): 3.4
LA Volume Index (BP A-L): 0.02
LVID diastole (2D): 3.73
LVID systole (2D): 2.3
MV Area (PHT): 3.417
MV E/A: 1.3
MV E/A: 1.311
MV E/e' (Average): 10.526
Mitral Valve Findings: NORMAL
Prox Ascending Aorta Diameter: 3.1
Pulmonary Valve Findings: NORMAL
RV Basal Diastolic Dimension: 3.74
RV Function: NORMAL
RV Systolic Pressure: 22.892
Site RA Size (AS): NORMAL
Site RV Size (AS): NORMAL
TAPSE: 1.88
Tricuspid Valve Findings: NORMAL

## 2020-12-02 ENCOUNTER — Telehealth (INDEPENDENT_AMBULATORY_CARE_PROVIDER_SITE_OTHER): Payer: Self-pay | Admitting: Internal Medicine

## 2020-12-02 ENCOUNTER — Telehealth (INDEPENDENT_AMBULATORY_CARE_PROVIDER_SITE_OTHER): Payer: Self-pay

## 2020-12-02 NOTE — Telephone Encounter (Signed)
I received a call from the patient. She advised she was returning a call from the nurse. I advised the nurse will call her back after reaching out to nurse.    Vanessa Kelly  [facility].

## 2020-12-02 NOTE — Telephone Encounter (Signed)
I spoke with pt regarding echocardiogram result.

## 2020-12-02 NOTE — Telephone Encounter (Signed)
-----   Message from Royann Shivers, MD sent at 12/01/2020  8:52 PM EDT -----  No high risk findings on the echo

## 2020-12-02 NOTE — Telephone Encounter (Signed)
Spoke with patient regarding echocardiogram result.

## 2020-12-04 ENCOUNTER — Encounter (INDEPENDENT_AMBULATORY_CARE_PROVIDER_SITE_OTHER): Payer: Self-pay | Admitting: Surgery

## 2020-12-04 ENCOUNTER — Ambulatory Visit (INDEPENDENT_AMBULATORY_CARE_PROVIDER_SITE_OTHER): Payer: Medicare Other | Admitting: Surgery

## 2020-12-04 VITALS — BP 145/72 | HR 80 | Temp 98.2°F

## 2020-12-04 DIAGNOSIS — I872 Venous insufficiency (chronic) (peripheral): Secondary | ICD-10-CM

## 2020-12-04 NOTE — Progress Notes (Signed)
CONSULTATION  Vascular Surgery      Date Time:  10:42 AM  Referring Physician: Celene Skeen  Consulting Physician: Kyra Leyland. Keina Mutch II  Consulting Service: Vascular Surgery    Reason For Consultation:  Review carotid ultrasound and examined left lower extremity    History of Present Illness     Vanessa Kelly is a 80 y.o. female here for establishment of vascular care.  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  There are no reports of rest pain, claudication or TIA type symptoms.  Risk factors for vascular disease includes diabetes hypertension and hyperlipidemia.    Past History     Past Medical History:   Diagnosis Date    Diabetes mellitus     Gastroesophageal reflux disease     Hyperlipidemia     Hypertension     Seasonal allergic rhinitis         No past surgical history on file.    Family History   Problem Relation Age of Onset    Heart failure Mother     Myocardial Infarction Father        Social History     Socioeconomic History    Marital status: Married   Tobacco Use    Smoking status: Former     Types: Cigarettes     Quit date: 1990     Years since quitting: 32.6    Smokeless tobacco: Never   Vaping Use    Vaping Use: Never used   Substance and Sexual Activity    Alcohol use: Not Currently    Drug use: Never       Allergies   Allergen Reactions    Fentanyl Anaphylaxis     Hives    Percolone [Oxycodone]        Medications     @IPPTAMEDS @    Review of systems     A comprehensive ten review of systems was: Negative in the neurologic, respiratory, cardiac, vascular, gastrointestinal, genitourinary, musculoskeletal, immunologic,psychiatricor endocrinologic systems except as mentioned above.     Physical Exam     Vitals:    12/04/20 1016   BP: 145/72   Pulse: 80   Temp: 98.2 F (36.8 C)   SpO2: 97%        General:  Patient appears their stated age, well-nourished.  Alert and in no apparent distress.    Psych: AAOX3, appropriate mood and affect.    Skin color appropriate for race, warm, dry.    Cardiac: RRR, carotid bruits not present, no JVD.     Abd: Soft, nondistended, nontender. No guarding or rebound, mid line pulsatile mass  No    Extremities:Full ROM, symmetric  Ischemic changes not present, Ulceration not present,  Gangrene not present, Peripheral edema: Yes  Pulses: Easily palpable pulses throughout bilaterally veins: Varicose veins No, hyperpigmentation No, lipo-dermatosclerosis No      Neuro: CN II-XII intact, gross motor and sensory intact    Labs     Results       ** No results found for the last 24 hours. **            Imaging     Duplex US -bilateral 50 to 69% carotid stenosisTransthoracic Echocardiogram (TTE)    Result Date: 12/01/2020  Name:  Vanessa Kelly Age:     60 years DOB:     1940-11-30 Gender:     Female MRN:     [id number] Wt:     182 lb BSA:     2.02 m2 Systolic BP:     846 mmHg Diastolic BP:     67 mmHg Technical Quality:     Technical difficult Exam Date/Time:     12/01/2020 10:13 AM Study Site:     [facility] Exam Type:     ECHOCARDIOGRAM ADULT COMPLETE W CLR/ DOPP WAVEFORM Technically difficult due to:     Restricted Mobility, Poor acoustic windows Study Info Indications     R42-Dizziness and giddiness -     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 Reading Physician:     Royann Shivers MD Staff Sonographer:     Jacki Cones RDMS, RDCS, RVT Ordering Physician:     Rich Number MD 69 in Summary   * Left ventricular ejection fraction is normal with an estimated ejection fraction of 65-70%.   * There is mild concentric left ventricular hypertrophy.    * Left ventricular diastolic filling parameters demonstrate normal diastolic function.   * Normal right ventricular systolic function.   * There is trace aortic regurgitation.   * No pulmonary hypertension with estimated right ventricular systolic pressure of  23 mmHg.   * The IVC is normal in size with > 50% respiratory variance consistent with normal RA pressure of 3 mmHg. Findings Left Ventricle   The left ventricle is small. There is mild concentric left ventricular hypertrophy. Left ventricular ejection fraction is normal with an estimated ejection fraction of 65-70%. Left ventricular segmental wall motion is normal. Left ventricular diastolic filling parameters demonstrate normal diastolic function. Right Ventricle   The right ventricular cavity size is normal in size. Normal right ventricular systolic function. Left Atrium   The left atrium is normal in size. Right Atrium   The right atrium is normal in size. Atrial Septum   No evidence of interatrial shunt by color Doppler. Aortic Valve   The aortic valve is tricuspid. There is no aortic stenosis. There is trace aortic regurgitation. Pulmonary Valve   The pulmonic valve is structurally normal. There is no pulmonic regurgitation. Mitral Valve   The mitral valve is structurally normal. There is no mitral regurgitation. Tricuspid Valve   The tricuspid valve is structurally normal. There is trace to mild tricuspid regurgitation. No pulmonary hypertension with estimated right ventricular systolic pressure of  23 mmHg. Pericardium / Pleural Effusion   No pericardial effusion visualized. Inferior Vena Cava   The IVC is normal in size with > 50% respiratory variance consistent with normal RA pressure of 3 mmHg. Aorta   The aortic root is normal in size. The ascending aorta is normal in size. Measurements 2D Measurements ---------------------------------------------------------------------- Name                                 Value        Normal  ---------------------------------------------------------------------- Parasternal 2D ----------------------------------------------------------------------  IVS Diastolic Thickness (2D)  1.17 cm     0.60-0.90 LVID Diastole (2D)                 3.73 cm     3.80-5.20  LVIW Diastolic Thickness (2D)                               1.16 cm     0.60-0.95 LVID Systole (2D)                  2.30 cm     2.20-3.50 LVOT Diameter                      2.00 cm               LA Dimension (2D)                  3.40 cm     2.70-3.80 Sinus of Valsalva Diameter         2.70 cm     2.70-3.30 Prox Asc Ao Diameter               3.10 cm     2.30-3.10 Apical 2D Dimensions ----------------------------------------------------------------------  RV Basal Diastolic Dimension                          3.74 cm     2.50-4.10 LA Volume Index (BP A-L)        19.66 ml/m2       <=34.00 RA Area (4C)                     15.40 cm2       <=18.00 M-mode Measurements ---------------------------------------------------------------------- Name                                 Value        Normal ---------------------------------------------------------------------- M-Mode ---------------------------------------------------------------------- TAPSE                              1.88 cm        >=1.60 LVOT/Aortic Valve Doppler Measurements ---------------------------------------------------------------------- Name                                 Value        Normal ---------------------------------------------------------------------- LVOT Doppler ---------------------------------------------------------------------- LVOT VTI                          28.70 cm               LVOT Peak Velocity                1.27 m/s               LVOT Mean Gradient               4.00 mmHg               AoV Doppler ---------------------------------------------------------------------- AV VTI                            55.00 cm  AV Peak  Velocity                  2.41 m/s               AV Mean Gradient                   12 mmHg           <20 AV Area (Cont Eq Vel)             1.66 cm2               AV Area (Cont Eq VTI)             1.64 cm2        >=3.00 AV V1/V2 Ratio                        0.53 RVOT/Pulmonic Valve Doppler Measurements ---------------------------------------------------------------------- Name                                 Value        Normal ---------------------------------------------------------------------- PV Doppler ---------------------------------------------------------------------- PV VTI                            23.50 cm               PV Peak Velocity                  1.08 m/s               PV Mean Gradient                 2.00 mmHg Mitral Valve Measurements ---------------------------------------------------------------------- Name                                 Value        Normal ---------------------------------------------------------------------- MV Doppler ---------------------------------------------------------------------- MV E Peak Velocity                0.99 m/s               MV A Peak Velocity                0.76 m/s               MV E/A                                1.31               MV Decel Time                       222 ms               MV Pressure Half Time                65 ms               MV Annular TDI ---------------------------------------------------------------------- MV Septal e' Velocity             0.08 m/s        >=0.08 MV E/e' (Septal)  12.20        <=8.00 MV Lateral e' Velocity            0.11 m/s        >=0.10 MV E/e' (Lateral)                     8.85        <=8.00 Tricuspid Valve Measurements ---------------------------------------------------------------------- Name                                 Value        Normal ---------------------------------------------------------------------- TV Regurgitation Doppler  ---------------------------------------------------------------------- TR Peak Velocity                  2.23 m/s               RA Pressure                         3 mmHg           <=3 PA Systolic Pressure               23 mmHg           <36 RV Systolic Pressure               23 mmHg           <36 TV Annular TDI ---------------------------------------------------------------------- TV Lateral Ann s' Velocity        0.09 m/s     0.10-0.19 Aorta / Venous Measurements ---------------------------------------------------------------------- Name                                 Value        Normal ---------------------------------------------------------------------- Thoracic Aorta ---------------------------------------------------------------------- Trans Ao Arch Diameter             2.50 cm               IVC/SVC ---------------------------------------------------------------------- IVC Diameter (Exp 2D)              1.33 cm        <=2.10 Report Signatures Finalized by Royann Shivers  MD on 12/01/2020 08:55 PM Preliminary by Jacki Cones  RDMS, RDCS, RVT on 12/01/2020 11:17 AM        Assessment and Plan     There is no problem list on file for this patient.      Assessment:  80 y.o. female with expected mild to moderate bilateral carotid disease that is asymptomatic.  She has some left lower extremity edema on the side of a previous greater saphenous venous harvest site and thus we will obtain a venous ultrasound to assess.    Plan:  Return in 2 weeks after venous ultrasound.    I performed a clinical assessment of this patient with 30 minutes spent in total patient care time in order to obtain an appropriate history and physical exam. My diagnostic impression and possible therapeutic options were discussed with the patient including the risks and benefits of such an approach and all questions were answered. Greater than 50% of the time was spent in this regard due to the complexity of the case.    Fae Pippin, MD MD

## 2020-12-09 ENCOUNTER — Other Ambulatory Visit (INDEPENDENT_AMBULATORY_CARE_PROVIDER_SITE_OTHER): Payer: Medicare Other

## 2020-12-17 ENCOUNTER — Ambulatory Visit
Admission: RE | Admit: 2020-12-17 | Discharge: 2020-12-17 | Disposition: A | Discharge status: Home / Self Care | Payer: Medicare Other | Source: Ambulatory Visit | Attending: Physician Assistant | Admitting: Physician Assistant

## 2020-12-17 DIAGNOSIS — M48061 Spinal stenosis, lumbar region without neurogenic claudication: Secondary | ICD-10-CM | POA: Insufficient documentation

## 2020-12-17 DIAGNOSIS — M47816 Spondylosis without myelopathy or radiculopathy, lumbar region: Secondary | ICD-10-CM | POA: Insufficient documentation

## 2020-12-17 DIAGNOSIS — M5416 Radiculopathy, lumbar region: Secondary | ICD-10-CM

## 2020-12-17 DIAGNOSIS — M5136 Other intervertebral disc degeneration, lumbar region: Secondary | ICD-10-CM | POA: Insufficient documentation

## 2020-12-17 DIAGNOSIS — M9973 Connective tissue and disc stenosis of intervertebral foramina of lumbar region: Secondary | ICD-10-CM | POA: Insufficient documentation

## 2020-12-24 ENCOUNTER — Other Ambulatory Visit (INDEPENDENT_AMBULATORY_CARE_PROVIDER_SITE_OTHER): Payer: Medicare Other

## 2020-12-29 ENCOUNTER — Encounter (INDEPENDENT_AMBULATORY_CARE_PROVIDER_SITE_OTHER): Payer: Self-pay

## 2020-12-29 ENCOUNTER — Ambulatory Visit (INDEPENDENT_AMBULATORY_CARE_PROVIDER_SITE_OTHER): Payer: Medicare Other | Admitting: Internal Medicine

## 2020-12-29 ENCOUNTER — Encounter (INDEPENDENT_AMBULATORY_CARE_PROVIDER_SITE_OTHER): Payer: Self-pay | Admitting: Internal Medicine

## 2020-12-29 ENCOUNTER — Telehealth (INDEPENDENT_AMBULATORY_CARE_PROVIDER_SITE_OTHER): Payer: Self-pay

## 2020-12-29 VITALS — BP 128/66 | HR 78 | Temp 96.0°F | Ht 69.0 in | Wt 185.0 lb

## 2020-12-29 DIAGNOSIS — I2583 Coronary atherosclerosis due to lipid rich plaque: Secondary | ICD-10-CM

## 2020-12-29 DIAGNOSIS — I2510 Atherosclerotic heart disease of native coronary artery without angina pectoris: Secondary | ICD-10-CM

## 2020-12-29 NOTE — Telephone Encounter (Signed)
I have spoken with the nursing staff and they are faxing patient's most recent labs to our [place] office for Dr. Celene Skeen to review.

## 2020-12-29 NOTE — Progress Notes (Signed)
[facility] [place] [place] [facility] CONSULTATION    I had the pleasure of seeing [name] today for cardiovascular evaluation. She is a pleasant 80 y.o. female with a history of CABG 3V 2002 [facility], stents 2006 - [facility],  HTN, DM, CVA, carotid artery stenosis who presents for follow-up.  She was seen by neurology and diagnosed with Parkinson's disease.  She was also diagnosed with depression.  She was started on Lyrica and venlafaxine.  She was seen by vascular surgery and is scheduled for lower extremity Dopplers this week.  Blood pressures fluctuate.  Review of home blood pressure log reveals blood pressure systolics in the 110s and 120s.  No significant orthostasis.  She is compliant with medications.  She does drink large amounts of fluid a day.  She is trying to reduce her sodium intake.  She has not had recent syncopal episodes.  Rare episodes of dizziness.  She has no chest pain, shortness of breath, palpitations.  She walks with a walker.      Cardiologist Darleen Crocker MD -[facility] - last     PAST MEDICAL HISTORY: She has a past medical history of Diabetes mellitus, Gastroesophageal reflux disease, Hyperlipidemia, Hypertension, and Seasonal allergic rhinitis. She has no past surgical history on file.        MEDICATIONS:   Current Outpatient Medications   Medication Sig Dispense Refill    candesartan (ATACAND) 4 MG tablet Take 4 mg by mouth daily      furosemide (LASIX) 40 MG tablet Take 40 mg by mouth as needed      lovastatin (MEVACOR) 40 MG tablet Take 40 mg by mouth nightly      ranolazine (RANEXA) 500 MG 12 hr tablet Take 500 mg by mouth 2 (two) times daily      acetaminophen (TYLENOL) 325 MG tablet Take 650 mg by mouth      aspirin EC 81 MG EC tablet Take 81 mg by mouth daily      calcium carbonate (TUMS) 500 MG chewable tablet Chew 1 tablet by mouth daily      Dulaglutide (Trulicity) 0.75 MG/0.5ML Solution Pen-injector Inject into the skin (Patient not  taking: Reported on 12/29/2020)      fexofenadine (ALLEGRA) 180 MG tablet Take 180 mg by mouth daily      fluticasone (FLONASE) 50 MCG/ACT nasal spray 1 spray by Nasal route daily      multivitamin-iron-minerals-folic acid (CENTRUM) chewable tablet Chew 1 tablet by mouth daily      pantoprazole (PROTONIX) 40 MG tablet Take 40 mg by mouth daily      sertraline (ZOLOFT) 25 MG tablet Take 25 mg by mouth daily      traMADol (ULTRAM) 50 MG tablet Take 50 mg by mouth every 6 (six) hours as needed for Pain      vitamin D (CHOLECALCIFEROL) 25 MCG (1000 UT) tablet Take by mouth daily 50,000 units once a week      zolpidem (AMBIEN CR) 12.5 MG CR tablet Take 5 mg by mouth daily       No current facility-administered medications for this visit.           ALLERGIES:   Allergies   Allergen Reactions    Fentanyl Anaphylaxis     Hives    Percolone [Oxycodone]          FAMILY HISTORY: Her family history includes Heart failure in her mother; Myocardial Infarction in her father.  SOCIAL HISTORY: She reports that she quit smoking about 32 years ago. Her smoking use included cigarettes. She has never used smokeless tobacco. She reports that she does not currently use alcohol. She reports that she does not use drugs.      REVIEW OF SYSTEMS: All other systems reviewed and negative except as above.         PHYSICAL EXAMINATION  General Appearance:  A well-appearing female in no acute distress.    Visit Vitals  BP 128/66 (BP Site: Left arm, Patient Position: Sitting, Cuff Size: Medium)   Pulse 78   Temp (!) 96 F (35.6 C) (Temporal)   Ht 1.753 m (5\' 9" )   Wt 83.9 kg (185 lb)   BMI 27.32 kg/m      HEENT: Sclera anicteric, conjunctiva without pallor, moist mucous membranes, normal dentition. No arcus.   Neck:  Supple without jugular venous distention. Thyroid nonpalpable. Normal carotid upstrokes without bruits.   Chest: Clear to auscultation bilaterally with good air movement and respiratory effort and no wheezes, rales, or rhonchi    Cardiovascular: Normal S1 and physiologically split S2 without murmurs, gallops or rub. PMI of normal size and nondisplaced.   Abdomen: Soft, nontender, nondistended, with normoactive bowel sounds. No organomegaly.  No pulsatile masses, or bruits.   Extremities: Warm without significant edema.  Skin: No rash, xanthoma or xanthelasma.   Neuro: Alert and oriented x3. Grossly intact. Strength is symmetrical. Normal mood and affect.                   LABS: Reviewed in epic    Echocardiogram 12/01/2020         Summary    * Left ventricular ejection fraction is normal with an estimated ejection  fraction of 65-70%.    * There is mild concentric left ventricular hypertrophy.    * Left ventricular diastolic filling parameters demonstrate normal diastolic  function.    * Normal right ventricular systolic function.    * There is trace aortic regurgitation.    * No pulmonary hypertension with estimated right ventricular systolic  pressure of  23 mmHg.    * The IVC is normal in size with > 50% respiratory variance consistent with  normal RA pressure of 3 mmHg.    PET myocardial perfusion imaging 01/27/2020 - Equivocal. Small sized area of ischemia in the apical wall. EF 85%, no RWMA    Carotid artery Dopplers 05/11/2020      Findings:  The right and left side common carotid arteries, bifurcations, carotid bulbs as well as the internal and external carotid arteries show no evidence of obstruction or significant stenosis. Peak systolic flow velocities are within the normal range with normal-appearing Doppler waveforms.     Right common artery: Peak systolic velocity 184 cm/s     Right internal carotid artery: Peak systolic velocity 142 cm/s     Left common carotid artery: Peak systolic velocity 144 cm/s     Left internal carotid artery: Peak systolic velocity 165 cm/s     The right ICA/CCA ratio is 0.9     The left ICA/CCA ratio is 1.3   Atherosclerotic plaque is seen of the carotid bulbs bilaterally.   Vertebral artery flow is  antegrade bilaterally       IMPRESSION:   1. Atherosclerotic disease, bilateral 50-69% stenosis internal carotid arteries.      IMPRESSION/RECOMMENDATIONS:     CAD status post CABG 2002, status post stents 2006 -stable, asymptomatic.  She  reports cardiac catheterization in 2019, she was to have a stent at that time however the stent could not be placed.  Cardiac cath report requested but not obtained.  We will try to obtain again.  Mild apical ischemia on PET myocardial fusion scan 01/2020.  Continue with aspirin, statin, Ranexa.  She is asymptomatic.  Continue to monitor.  Lab work from [facility] assisted [facility] requested.    Hypertension -controlled, continue current medications.    History of orthostatic hypotension -stable, asymptomatic.  Recent diagnosis of Parkinson's disease likely contributing.  Avoid dehydration.  Continue with TED hose.    Prior history of CVA      Diabetes -Per endocrinology/PCP.    Carotid artery disease -following with Dr. Doreatha Martin, vascular surgery.    Recent diagnosis of depression.    20 minutes spent.    Follow-up in 6 months.    Rich Number, MD  12/29/2020, 9:18 AM  [facility]  [phone]

## 2021-01-01 ENCOUNTER — Ambulatory Visit (INDEPENDENT_AMBULATORY_CARE_PROVIDER_SITE_OTHER): Payer: Medicare Other | Admitting: Surgery

## 2021-01-01 ENCOUNTER — Encounter (INDEPENDENT_AMBULATORY_CARE_PROVIDER_SITE_OTHER): Payer: Self-pay | Admitting: Surgery

## 2021-01-01 ENCOUNTER — Ambulatory Visit (INDEPENDENT_AMBULATORY_CARE_PROVIDER_SITE_OTHER): Payer: Medicare Other

## 2021-01-01 ENCOUNTER — Encounter (INDEPENDENT_AMBULATORY_CARE_PROVIDER_SITE_OTHER): Payer: Self-pay

## 2021-01-01 VITALS — BP 126/69 | HR 69 | Temp 97.6°F | Wt 180.8 lb

## 2021-01-01 DIAGNOSIS — R600 Localized edema: Secondary | ICD-10-CM

## 2021-01-01 DIAGNOSIS — I872 Venous insufficiency (chronic) (peripheral): Secondary | ICD-10-CM

## 2021-01-01 NOTE — Progress Notes (Signed)
Appt Date/Time:     80 y.o. female   PCP: Pcp, None, MD  Referring Physician:         Appointment Type: Follow-Up  Imaging: [place] Today    Chief Complaint/HPI:2 week f/u  BLE pain and swelling    Date of Last Visit:  12/04/20    Allergies:   Allergies   Allergen Reactions    Fentanyl Anaphylaxis     Hives    Percolone [Oxycodone]          Selected Medications:   n/a    Focused Prior Medical History:  n/a             Smoker           Former Smoker          Never Smoker      Vital Signs this Visit Pulses  HT  BP R BP L Temp   Rad Ulnar Brach Fem Pop DP PT        R          WT  Pulse R Pulse L SpO2       L          Imaging results:        Plan of Care:

## 2021-01-12 NOTE — Progress Notes (Signed)
CONSULTATION  Vascular Surgery      Date Time:  1:59 PM  Referring Physician: Celene Skeen  Consulting Physician: Kyra Leyland. Brailynn Breth II  Consulting Service: Vascular Surgery    Reason For Consultation:  Review carotid ultrasound and examined left lower extremity    History of Present Illness     Vanessa Kelly is a 80 y.o. female here for establishment of vascular care.  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  There are no reports of rest pain, claudication or TIA type symptoms.  Risk factors for vascular disease includes diabetes hypertension and hyperlipidemia.    She returns today after undergoing a venous ultrasound to assess for venous insufficiency as a contributor to her lower extremity leg swelling.    Past History     Past Medical History:   Diagnosis Date    Diabetes mellitus     Gastroesophageal reflux disease     Hyperlipidemia     Hypertension     Seasonal allergic rhinitis         No past surgical history on file.    Family History   Problem Relation Age of Onset    Heart failure Mother     Myocardial Infarction Father        Social History     Socioeconomic History    Marital status: Married   Tobacco Use    Smoking status: Former     Types: Cigarettes     Quit date: 1990     Years since quitting: 32.7    Smokeless tobacco: Never   Vaping Use    Vaping Use: Never used   Substance and Sexual Activity    Alcohol use: Not Currently    Drug use: Never       Allergies   Allergen Reactions    Fentanyl Anaphylaxis     Hives    Percolone [Oxycodone]        Medications     @IPPTAMEDS @    Review of systems     A comprehensive ten review of systems was: Negative in the neurologic, respiratory, cardiac, vascular, gastrointestinal, genitourinary, musculoskeletal, immunologic,psychiatricor endocrinologic  systems except as mentioned above.     Physical Exam     Vitals:    01/01/21 1221   BP: 126/69   Pulse: 69   Temp: 97.6 F (36.4 C)   SpO2: 98%       General:  Patient appears their stated age, well-nourished.  Alert and in no apparent distress.    Psych: AAOX3, appropriate mood and affect.    Skin color appropriate for race, warm, dry.    Cardiac: RRR, carotid bruits not present, no JVD.     Abd: Soft, nondistended, nontender. No guarding or rebound, mid line pulsatile mass  No    Extremities:Full ROM, symmetric  Ischemic changes not present, Ulceration not present,  Gangrene not present, Peripheral edema: Yes  Pulses: Easily palpable pulses throughout bilaterally veins: Varicose veins No, hyperpigmentation No, lipo-dermatosclerosis No      Neuro: CN II-XII intact, gross motor and sensory intact    Labs     Results       ** No results found for the last 24 hours. **  Imaging   Venous duplex-left short saphenous vein trace reflux only.    Duplex US -bilateral 50 to 69% carotid stenosisTransthoracic Echocardiogram (TTE)    Result Date: 12/01/2020  Name:     Vanessa Kelly Age:     4 years DOB:     Aug 24, 1940 Gender:     Female MRN:     [id number] Wt:     182 lb BSA:     2.02 m2 Systolic BP:     540 mmHg Diastolic BP:     67 mmHg Technical Quality:     Technical difficult Exam Date/Time:     12/01/2020 10:13 AM Study Site:     [facility] Exam Type:     ECHOCARDIOGRAM ADULT COMPLETE W CLR/ DOPP WAVEFORM Technically difficult due to:     Restricted Mobility, Poor acoustic windows Study Info Indications     R42-Dizziness and giddiness -     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 Reading Physician:     Royann Shivers MD Staff Sonographer:     Jacki Cones RDMS, RDCS, RVT Ordering  Physician:     Rich Number MD 69 in Summary   * Left ventricular ejection fraction is normal with an estimated ejection fraction of 65-70%.   * There is mild concentric left ventricular hypertrophy.   * Left ventricular diastolic filling parameters demonstrate normal diastolic function.   * Normal right ventricular systolic function.   * There is trace aortic regurgitation.   * No pulmonary hypertension with estimated right ventricular systolic pressure of  23 mmHg.   * The IVC is normal in size with > 50% respiratory variance consistent with normal RA pressure of 3 mmHg. Findings Left Ventricle   The left ventricle is small. There is mild concentric left ventricular hypertrophy. Left ventricular ejection fraction is normal with an estimated ejection fraction of 65-70%. Left ventricular segmental wall motion is normal. Left ventricular diastolic filling parameters demonstrate normal diastolic function. Right Ventricle   The right ventricular cavity size is normal in size. Normal right ventricular systolic function. Left Atrium   The left atrium is normal in size. Right Atrium   The right atrium is normal in size. Atrial Septum   No evidence of interatrial shunt by color Doppler. Aortic Valve   The aortic valve is tricuspid. There is no aortic stenosis. There is trace aortic regurgitation. Pulmonary Valve   The pulmonic valve is structurally normal. There is no pulmonic regurgitation. Mitral Valve   The mitral valve is structurally normal. There is no mitral regurgitation. Tricuspid Valve   The tricuspid valve is structurally normal. There is trace to mild tricuspid regurgitation. No pulmonary hypertension with estimated right ventricular systolic pressure of  23 mmHg. Pericardium / Pleural Effusion   No pericardial effusion visualized. Inferior Vena Cava   The IVC is normal in size with > 50% respiratory variance consistent with normal RA pressure of 3 mmHg. Aorta   The aortic root is normal in size. The  ascending aorta is normal in size. Measurements 2D Measurements ---------------------------------------------------------------------- Name                                 Value        Normal ---------------------------------------------------------------------- Parasternal  2D ----------------------------------------------------------------------  IVS Diastolic Thickness (2D)                               1.17 cm     0.60-0.90 LVID Diastole (2D)                 3.73 cm     3.80-5.20  LVIW Diastolic Thickness (2D)                               1.16 cm     0.60-0.95 LVID Systole (2D)                  2.30 cm     2.20-3.50 LVOT Diameter                      2.00 cm               LA Dimension (2D)                  3.40 cm     2.70-3.80 Sinus of Valsalva Diameter         2.70 cm     2.70-3.30 Prox Asc Ao Diameter               3.10 cm     2.30-3.10 Apical 2D Dimensions ----------------------------------------------------------------------  RV Basal Diastolic Dimension                          3.74 cm     2.50-4.10 LA Volume Index (BP A-L)        19.66 ml/m2       <=34.00 RA Area (4C)                     15.40 cm2       <=18.00 M-mode Measurements ---------------------------------------------------------------------- Name                                 Value        Normal ---------------------------------------------------------------------- M-Mode ---------------------------------------------------------------------- TAPSE                              1.88 cm        >=1.60 LVOT/Aortic Valve Doppler Measurements ---------------------------------------------------------------------- Name                                 Value        Normal ---------------------------------------------------------------------- LVOT Doppler ---------------------------------------------------------------------- LVOT VTI                          28.70 cm               LVOT Peak Velocity                1.27 m/s               LVOT Mean Gradient                4.00 mmHg               AoV Doppler ---------------------------------------------------------------------- AV VTI  55.00 cm               AV Peak Velocity                  2.41 m/s               AV Mean Gradient                   12 mmHg           <20 AV Area (Cont Eq Vel)             1.66 cm2               AV Area (Cont Eq VTI)             1.64 cm2        >=3.00 AV V1/V2 Ratio                        0.53 RVOT/Pulmonic Valve Doppler Measurements ---------------------------------------------------------------------- Name                                 Value        Normal ---------------------------------------------------------------------- PV Doppler ---------------------------------------------------------------------- PV VTI                            23.50 cm               PV Peak Velocity                  1.08 m/s               PV Mean Gradient                 2.00 mmHg Mitral Valve Measurements ---------------------------------------------------------------------- Name                                 Value        Normal ---------------------------------------------------------------------- MV Doppler ---------------------------------------------------------------------- MV E Peak Velocity                0.99 m/s               MV A Peak Velocity                0.76 m/s               MV E/A                                1.31               MV Decel Time                       222 ms               MV Pressure Half Time                65 ms               MV Annular TDI ---------------------------------------------------------------------- MV Septal e' Velocity             0.08 m/s        >=0.08  MV E/e' (Septal)                     12.20        <=8.00 MV Lateral e' Velocity            0.11 m/s        >=0.10 MV E/e' (Lateral)                     8.85        <=8.00 Tricuspid Valve Measurements ---------------------------------------------------------------------- Name                                  Value        Normal ---------------------------------------------------------------------- TV Regurgitation Doppler ---------------------------------------------------------------------- TR Peak Velocity                  2.23 m/s               RA Pressure                         3 mmHg           <=3 PA Systolic Pressure               23 mmHg           <36 RV Systolic Pressure               23 mmHg           <36 TV Annular TDI ---------------------------------------------------------------------- TV Lateral Ann s' Velocity        0.09 m/s     0.10-0.19 Aorta / Venous Measurements ---------------------------------------------------------------------- Name                                 Value        Normal ---------------------------------------------------------------------- Thoracic Aorta ---------------------------------------------------------------------- Trans Ao Arch Diameter             2.50 cm               IVC/SVC ---------------------------------------------------------------------- IVC Diameter (Exp 2D)              1.33 cm        <=2.10 Report Signatures Finalized by Royann Shivers  MD on 12/01/2020 08:55 PM Preliminary by Jacki Cones  RDMS, RDCS, RVT on 12/01/2020 11:17 AM        Assessment and Plan     There is no problem list on file for this patient.      Assessment:  80 y.o. female with expected mild to moderate bilateral carotid disease that is asymptomatic.  She has some left lower extremity edema on the side of a previous greater saphenous venous harvest site and thus we obtained a venous ultrasound to assess.  There was no significant reflux to justify further treatment of her venous insufficiency.    Plan:  Return in 1 year for surveillance examination.  .  I performed a clinical assessment of this patient with 30 minutes spent in total patient care time in order to obtain an appropriate history and physical exam. My diagnostic impression and possible  therapeutic options were discussed with the patient including the risks and benefits of such an approach and all questions were answered. Greater than 50% of the time was  spent in this regard due to the complexity of the case.    Fae Pippin, MD MD

## 2021-01-15 ENCOUNTER — Ambulatory Visit (INDEPENDENT_AMBULATORY_CARE_PROVIDER_SITE_OTHER): Payer: Medicare Other | Admitting: Surgery

## 2021-01-21 ENCOUNTER — Emergency Department: Payer: Medicare Other

## 2021-01-21 ENCOUNTER — Inpatient Hospital Stay
Admission: EM | Admit: 2021-01-21 | Discharge: 2021-02-02 | DRG: 202 | Disposition: A | Discharge status: Skilled Nursing Facility | Payer: Medicare Other | Attending: Internal Medicine | Admitting: Internal Medicine

## 2021-01-21 DIAGNOSIS — N179 Acute kidney failure, unspecified: Secondary | ICD-10-CM | POA: Diagnosis present

## 2021-01-21 DIAGNOSIS — I440 Atrioventricular block, first degree: Secondary | ICD-10-CM | POA: Diagnosis present

## 2021-01-21 DIAGNOSIS — M4805 Spinal stenosis, thoracolumbar region: Secondary | ICD-10-CM | POA: Diagnosis present

## 2021-01-21 DIAGNOSIS — F32A Depression, unspecified: Secondary | ICD-10-CM | POA: Diagnosis present

## 2021-01-21 DIAGNOSIS — I6529 Occlusion and stenosis of unspecified carotid artery: Secondary | ICD-10-CM | POA: Diagnosis present

## 2021-01-21 DIAGNOSIS — W19XXXA Unspecified fall, initial encounter: Secondary | ICD-10-CM | POA: Diagnosis present

## 2021-01-21 DIAGNOSIS — Z9861 Coronary angioplasty status: Secondary | ICD-10-CM

## 2021-01-21 DIAGNOSIS — J45909 Unspecified asthma, uncomplicated: Secondary | ICD-10-CM | POA: Diagnosis present

## 2021-01-21 DIAGNOSIS — R2681 Unsteadiness on feet: Secondary | ICD-10-CM | POA: Diagnosis present

## 2021-01-21 DIAGNOSIS — G20 Parkinson's disease: Secondary | ICD-10-CM | POA: Diagnosis present

## 2021-01-21 DIAGNOSIS — K5900 Constipation, unspecified: Secondary | ICD-10-CM | POA: Diagnosis not present

## 2021-01-21 DIAGNOSIS — Z951 Presence of aortocoronary bypass graft: Secondary | ICD-10-CM

## 2021-01-21 DIAGNOSIS — M899 Disorder of bone, unspecified: Secondary | ICD-10-CM | POA: Diagnosis present

## 2021-01-21 DIAGNOSIS — M5489 Other dorsalgia: Secondary | ICD-10-CM | POA: Diagnosis present

## 2021-01-21 DIAGNOSIS — I951 Orthostatic hypotension: Secondary | ICD-10-CM | POA: Diagnosis present

## 2021-01-21 DIAGNOSIS — R296 Repeated falls: Secondary | ICD-10-CM | POA: Diagnosis present

## 2021-01-21 DIAGNOSIS — Z79899 Other long term (current) drug therapy: Secondary | ICD-10-CM

## 2021-01-21 DIAGNOSIS — M545 Low back pain, unspecified: Secondary | ICD-10-CM

## 2021-01-21 DIAGNOSIS — D638 Anemia in other chronic diseases classified elsewhere: Secondary | ICD-10-CM | POA: Diagnosis present

## 2021-01-21 DIAGNOSIS — I10 Essential (primary) hypertension: Secondary | ICD-10-CM | POA: Diagnosis present

## 2021-01-21 DIAGNOSIS — E1165 Type 2 diabetes mellitus with hyperglycemia: Secondary | ICD-10-CM | POA: Diagnosis not present

## 2021-01-21 DIAGNOSIS — T426X5A Adverse effect of other antiepileptic and sedative-hypnotic drugs, initial encounter: Secondary | ICD-10-CM | POA: Diagnosis present

## 2021-01-21 DIAGNOSIS — K219 Gastro-esophageal reflux disease without esophagitis: Secondary | ICD-10-CM | POA: Diagnosis present

## 2021-01-21 DIAGNOSIS — Z96651 Presence of right artificial knee joint: Secondary | ICD-10-CM | POA: Diagnosis present

## 2021-01-21 DIAGNOSIS — Z87891 Personal history of nicotine dependence: Secondary | ICD-10-CM

## 2021-01-21 DIAGNOSIS — K449 Diaphragmatic hernia without obstruction or gangrene: Secondary | ICD-10-CM | POA: Diagnosis present

## 2021-01-21 DIAGNOSIS — E875 Hyperkalemia: Secondary | ICD-10-CM | POA: Diagnosis present

## 2021-01-21 DIAGNOSIS — Z8673 Personal history of transient ischemic attack (TIA), and cerebral infarction without residual deficits: Secondary | ICD-10-CM

## 2021-01-21 DIAGNOSIS — E871 Hypo-osmolality and hyponatremia: Secondary | ICD-10-CM | POA: Diagnosis present

## 2021-01-21 DIAGNOSIS — J206 Acute bronchitis due to rhinovirus: Principal | ICD-10-CM | POA: Diagnosis present

## 2021-01-21 DIAGNOSIS — I160 Hypertensive urgency: Secondary | ICD-10-CM | POA: Diagnosis present

## 2021-01-21 DIAGNOSIS — E785 Hyperlipidemia, unspecified: Secondary | ICD-10-CM | POA: Diagnosis present

## 2021-01-21 DIAGNOSIS — R0602 Shortness of breath: Secondary | ICD-10-CM

## 2021-01-21 DIAGNOSIS — E162 Hypoglycemia, unspecified: Secondary | ICD-10-CM

## 2021-01-21 DIAGNOSIS — T380X5A Adverse effect of glucocorticoids and synthetic analogues, initial encounter: Secondary | ICD-10-CM | POA: Diagnosis not present

## 2021-01-21 DIAGNOSIS — G8929 Other chronic pain: Secondary | ICD-10-CM | POA: Diagnosis present

## 2021-01-21 DIAGNOSIS — E11649 Type 2 diabetes mellitus with hypoglycemia without coma: Secondary | ICD-10-CM | POA: Diagnosis present

## 2021-01-21 DIAGNOSIS — M4803 Spinal stenosis, cervicothoracic region: Secondary | ICD-10-CM | POA: Diagnosis present

## 2021-01-21 DIAGNOSIS — M47816 Spondylosis without myelopathy or radiculopathy, lumbar region: Secondary | ICD-10-CM | POA: Diagnosis present

## 2021-01-21 DIAGNOSIS — Z7982 Long term (current) use of aspirin: Secondary | ICD-10-CM

## 2021-01-21 DIAGNOSIS — E2740 Unspecified adrenocortical insufficiency: Secondary | ICD-10-CM | POA: Diagnosis present

## 2021-01-21 DIAGNOSIS — Z20822 Contact with and (suspected) exposure to covid-19: Secondary | ICD-10-CM | POA: Diagnosis present

## 2021-01-21 DIAGNOSIS — G901 Familial dysautonomia [Riley-Day]: Secondary | ICD-10-CM | POA: Diagnosis present

## 2021-01-21 DIAGNOSIS — E860 Dehydration: Secondary | ICD-10-CM | POA: Diagnosis not present

## 2021-01-21 DIAGNOSIS — E1142 Type 2 diabetes mellitus with diabetic polyneuropathy: Secondary | ICD-10-CM | POA: Diagnosis present

## 2021-01-21 DIAGNOSIS — I2510 Atherosclerotic heart disease of native coronary artery without angina pectoris: Secondary | ICD-10-CM | POA: Diagnosis present

## 2021-01-21 DIAGNOSIS — I872 Venous insufficiency (chronic) (peripheral): Secondary | ICD-10-CM | POA: Diagnosis present

## 2021-01-21 DIAGNOSIS — Z7985 Long-term (current) use of injectable non-insulin antidiabetic drugs: Secondary | ICD-10-CM

## 2021-01-21 DIAGNOSIS — D649 Anemia, unspecified: Secondary | ICD-10-CM

## 2021-01-21 DIAGNOSIS — Z23 Encounter for immunization: Secondary | ICD-10-CM

## 2021-01-21 DIAGNOSIS — M5030 Other cervical disc degeneration, unspecified cervical region: Secondary | ICD-10-CM | POA: Diagnosis present

## 2021-01-21 LAB — COMPREHENSIVE METABOLIC PANEL
ALT: 27 U/L (ref 0–55)
AST (SGOT): 22 U/L (ref 5–41)
Albumin/Globulin Ratio: 1.1 (ref 0.9–2.2)
Albumin: 3.6 g/dL (ref 3.5–5.0)
Alkaline Phosphatase: 84 U/L (ref 37–117)
Anion Gap: 6 (ref 5.0–15.0)
BUN: 32 mg/dL — ABNORMAL HIGH (ref 7.0–21.0)
Bilirubin, Total: 0.3 mg/dL (ref 0.2–1.2)
CO2: 25 mEq/L (ref 17–29)
Calcium: 9.7 mg/dL (ref 7.9–10.2)
Chloride: 102 mEq/L (ref 99–111)
Creatinine: 1.4 mg/dL — ABNORMAL HIGH (ref 0.4–1.0)
Globulin: 3.3 g/dL (ref 2.0–3.6)
Glucose: 89 mg/dL (ref 70–100)
Potassium: 5.4 mEq/L — ABNORMAL HIGH (ref 3.5–5.3)
Protein, Total: 6.9 g/dL (ref 6.0–8.3)
Sodium: 133 mEq/L — ABNORMAL LOW (ref 135–145)

## 2021-01-21 LAB — CBC AND DIFFERENTIAL
Absolute NRBC: 0 10*3/uL (ref 0.00–0.00)
Basophils Absolute Automated: 0.03 10*3/uL (ref 0.00–0.08)
Basophils Automated: 0.5 %
Eosinophils Absolute Automated: 0.29 10*3/uL (ref 0.00–0.44)
Eosinophils Automated: 4.9 %
Hematocrit: 30.4 % — ABNORMAL LOW (ref 34.7–43.7)
Hgb: 10 g/dL — ABNORMAL LOW (ref 11.4–14.8)
Immature Granulocytes Absolute: 0.01 10*3/uL (ref 0.00–0.07)
Immature Granulocytes: 0.2 %
Lymphocytes Absolute Automated: 1.86 10*3/uL (ref 0.42–3.22)
Lymphocytes Automated: 31.5 %
MCH: 33 pg (ref 25.1–33.5)
MCHC: 32.9 g/dL (ref 31.5–35.8)
MCV: 100.3 fL — ABNORMAL HIGH (ref 78.0–96.0)
MPV: 10.4 fL (ref 8.9–12.5)
Monocytes Absolute Automated: 0.51 10*3/uL (ref 0.21–0.85)
Monocytes: 8.6 %
Neutrophils Absolute: 3.21 10*3/uL (ref 1.10–6.33)
Neutrophils: 54.3 %
Nucleated RBC: 0 /100 WBC (ref 0.0–0.0)
Platelets: 191 10*3/uL (ref 142–346)
RBC: 3.03 10*6/uL — ABNORMAL LOW (ref 3.90–5.10)
RDW: 13 % (ref 11–15)
WBC: 5.91 10*3/uL (ref 3.10–9.50)

## 2021-01-21 LAB — URINALYSIS REFLEX TO MICROSCOPIC EXAM - REFLEX TO CULTURE
Bilirubin, UA: NEGATIVE
Blood, UA: NEGATIVE
Glucose, UA: NEGATIVE
Ketones UA: NEGATIVE
Leukocyte Esterase, UA: NEGATIVE
Nitrite, UA: NEGATIVE
Protein, UR: NEGATIVE
Specific Gravity UA: 1.014 (ref 1.001–1.035)
Urine pH: 6 (ref 5.0–8.0)
Urobilinogen, UA: NEGATIVE mg/dL (ref 0.2–2.0)

## 2021-01-21 LAB — COVID-19 (SARS-COV-2): SARS CoV 2 Overall Result: NOT DETECTED

## 2021-01-21 LAB — MAGNESIUM: Magnesium: 1.8 mg/dL (ref 1.6–2.6)

## 2021-01-21 LAB — TROPONIN I: Troponin I: <0.01 ng/mL (ref 0.00–0.05)

## 2021-01-21 LAB — GLUCOSE WHOLE BLOOD - POCT
Whole Blood Glucose POCT: 51 mg/dL — CL (ref 70–100)
Whole Blood Glucose POCT: 130 mg/dL — ABNORMAL HIGH (ref 70–100)
Whole Blood Glucose POCT: 73 mg/dL (ref 70–100)
Whole Blood Glucose POCT: 149 mg/dL — ABNORMAL HIGH (ref 70–100)
Whole Blood Glucose POCT: 115 mg/dL — ABNORMAL HIGH (ref 70–100)

## 2021-01-21 LAB — GFR: EGFR: 43.5

## 2021-01-21 LAB — LIPASE: Lipase: 31 U/L (ref 8–78)

## 2021-01-21 LAB — IHS D-DIMER: D-Dimer: 0.83 ug/mL FEU — ABNORMAL HIGH (ref 0.00–0.60)

## 2021-01-21 LAB — B-TYPE NATRIURETIC PEPTIDE: B-Natriuretic Peptide: 71 pg/mL (ref 0–100)

## 2021-01-21 MED ORDER — IPRATROPIUM BROMIDE 0.02 % IN SOLN
0.5000 mg | Freq: Once | RESPIRATORY_TRACT | Status: AC
Start: 2021-01-21 — End: 2021-01-21
  Administered 2021-01-21: 14:00:00 0.5 mg via RESPIRATORY_TRACT
  Filled 2021-01-21: qty 2.5

## 2021-01-21 MED ORDER — SODIUM POLYSTYRENE SULFONATE 15 GM/60ML PO SUSP
15.0000 g | Freq: Once | ORAL | Status: DC
Start: 2021-01-21 — End: 2021-02-02
  Filled 2021-01-21: qty 60

## 2021-01-21 MED ORDER — FUROSEMIDE 40 MG PO TABS
40.0000 mg | ORAL_TABLET | Freq: Every day | ORAL | Status: DC
Start: 2021-01-21 — End: 2021-01-21

## 2021-01-21 MED ORDER — MELATONIN 3 MG PO TABS
3.0000 mg | ORAL_TABLET | Freq: Every evening | ORAL | Status: DC | PRN
Start: 2021-01-21 — End: 2021-02-02
  Administered 2021-01-21 – 2021-02-01 (×12): 3 mg via ORAL
  Filled 2021-01-21 (×12): qty 1

## 2021-01-21 MED ORDER — RANOLAZINE ER 500 MG PO TB12
500.0000 mg | ORAL_TABLET | Freq: Two times a day (BID) | ORAL | Status: DC
Start: 2021-01-21 — End: 2021-02-02
  Administered 2021-01-21 – 2021-02-02 (×24): 500 mg via ORAL
  Filled 2021-01-21 (×25): qty 1

## 2021-01-21 MED ORDER — ENOXAPARIN SODIUM 30 MG/0.3ML IJ SOSY
30.0000 mg | PREFILLED_SYRINGE | Freq: Every day | INTRAMUSCULAR | Status: DC
Start: 2021-01-21 — End: 2021-01-24
  Administered 2021-01-22 – 2021-01-24 (×3): 30 mg via SUBCUTANEOUS
  Filled 2021-01-21 (×3): qty 0.3

## 2021-01-21 MED ORDER — DEXTROSE 10 % IV BOLUS
25.0000 g | INTRAVENOUS | Status: DC | PRN
Start: 2021-01-21 — End: 2021-02-02

## 2021-01-21 MED ORDER — AZITHROMYCIN 250 MG PO TABS
500.0000 mg | ORAL_TABLET | Freq: Every day | ORAL | Status: AC
Start: 2021-01-21 — End: 2021-01-25
  Administered 2021-01-21 – 2021-01-25 (×5): 500 mg via ORAL
  Filled 2021-01-21 (×5): qty 2

## 2021-01-21 MED ORDER — ACETAMINOPHEN 325 MG PO TABS
650.0000 mg | ORAL_TABLET | Freq: Four times a day (QID) | ORAL | Status: DC | PRN
Start: 2021-01-21 — End: 2021-02-02
  Administered 2021-01-27 – 2021-01-31 (×5): 650 mg via ORAL
  Filled 2021-01-21 (×5): qty 2

## 2021-01-21 MED ORDER — IOHEXOL 350 MG/ML IV SOLN
80.0000 mL | Freq: Once | INTRAVENOUS | Status: AC | PRN
Start: 2021-01-21 — End: 2021-01-21
  Administered 2021-01-21: 17:00:00 80 mL via INTRAVENOUS

## 2021-01-21 MED ORDER — POTASSIUM CHLORIDE CRYS ER 20 MEQ PO TBCR
0.0000 meq | EXTENDED_RELEASE_TABLET | ORAL | Status: DC | PRN
Start: 2021-01-21 — End: 2021-02-02

## 2021-01-21 MED ORDER — PREGABALIN 25 MG PO CAPS
25.0000 mg | ORAL_CAPSULE | Freq: Two times a day (BID) | ORAL | Status: DC
Start: 2021-01-21 — End: 2021-01-30
  Administered 2021-01-22 – 2021-01-30 (×17): 25 mg via ORAL
  Filled 2021-01-21 (×17): qty 1

## 2021-01-21 MED ORDER — GLUCAGON 1 MG IJ SOLR (WRAP)
1.0000 mg | INTRAMUSCULAR | Status: DC | PRN
Start: 2021-01-21 — End: 2021-01-21

## 2021-01-21 MED ORDER — METHYLPREDNISOLONE SODIUM SUCC 40 MG IJ SOLR
40.0000 mg | Freq: Two times a day (BID) | INTRAMUSCULAR | Status: DC
Start: 2021-01-21 — End: 2021-01-26
  Administered 2021-01-21 – 2021-01-25 (×9): 40 mg via INTRAVENOUS
  Filled 2021-01-21 (×11): qty 1

## 2021-01-21 MED ORDER — ZOLPIDEM TARTRATE 5 MG PO TABS
5.0000 mg | ORAL_TABLET | Freq: Every evening | ORAL | Status: DC | PRN
Start: 2021-01-21 — End: 2021-01-21

## 2021-01-21 MED ORDER — DEXTROSE 50 % IV SOLN
25.0000 g | INTRAVENOUS | Status: DC | PRN
Start: 2021-01-21 — End: 2021-01-21

## 2021-01-21 MED ORDER — ATORVASTATIN CALCIUM 10 MG PO TABS
10.0000 mg | ORAL_TABLET | Freq: Every day | ORAL | Status: DC
Start: 2021-01-21 — End: 2021-02-02
  Administered 2021-01-21 – 2021-02-02 (×13): 10 mg via ORAL
  Filled 2021-01-21 (×13): qty 1

## 2021-01-21 MED ORDER — ASPIRIN 81 MG PO TBEC
81.0000 mg | DELAYED_RELEASE_TABLET | Freq: Every day | ORAL | Status: DC
Start: 2021-01-21 — End: 2021-02-02
  Administered 2021-01-21 – 2021-02-02 (×13): 81 mg via ORAL
  Filled 2021-01-21 (×13): qty 1

## 2021-01-21 MED ORDER — INSULIN LISPRO 100 UNIT/ML SOLN (WRAP)
1.0000 [IU] | Freq: Three times a day (TID) | Status: DC
Start: 2021-01-21 — End: 2021-02-02
  Administered 2021-01-24 – 2021-01-25 (×2): 3 [IU] via SUBCUTANEOUS
  Administered 2021-01-26 – 2021-01-27 (×2): 5 [IU] via SUBCUTANEOUS
  Administered 2021-01-28: 17:00:00 7 [IU] via SUBCUTANEOUS
  Administered 2021-01-28: 12:00:00 3 [IU] via SUBCUTANEOUS
  Administered 2021-01-29 – 2021-01-30 (×2): 7 [IU] via SUBCUTANEOUS
  Administered 2021-01-31: 13:00:00 3 [IU] via SUBCUTANEOUS
  Administered 2021-01-31: 18:00:00 5 [IU] via SUBCUTANEOUS
  Administered 2021-02-02 (×2): 3 [IU] via SUBCUTANEOUS
  Filled 2021-01-21: qty 9
  Filled 2021-01-21: qty 21
  Filled 2021-01-21: qty 9
  Filled 2021-01-21 (×2): qty 15
  Filled 2021-01-21: qty 21
  Filled 2021-01-21: qty 9
  Filled 2021-01-21: qty 21
  Filled 2021-01-21: qty 15

## 2021-01-21 MED ORDER — GLUCAGON 1 MG IJ SOLR (WRAP)
1.0000 mg | INTRAMUSCULAR | Status: DC | PRN
Start: 2021-01-21 — End: 2021-02-02

## 2021-01-21 MED ORDER — TRAMADOL HCL 50 MG PO TABS
50.0000 mg | ORAL_TABLET | Freq: Four times a day (QID) | ORAL | Status: DC | PRN
Start: 2021-01-21 — End: 2021-02-02
  Administered 2021-01-22 (×2): 50 mg via ORAL
  Filled 2021-01-21 (×2): qty 1

## 2021-01-21 MED ORDER — INSULIN LISPRO 100 UNIT/ML SOLN (WRAP)
1.0000 [IU] | Freq: Every evening | Status: DC
Start: 2021-01-21 — End: 2021-02-02
  Administered 2021-01-22: 22:00:00 1 [IU] via SUBCUTANEOUS
  Administered 2021-01-23: 23:00:00 2 [IU] via SUBCUTANEOUS
  Administered 2021-01-24: 22:00:00 1 [IU] via SUBCUTANEOUS
  Administered 2021-01-25 – 2021-01-27 (×3): 2 [IU] via SUBCUTANEOUS
  Administered 2021-01-28 – 2021-01-29 (×2): 1 [IU] via SUBCUTANEOUS
  Administered 2021-01-30 – 2021-01-31 (×2): 2 [IU] via SUBCUTANEOUS
  Administered 2021-02-01: 22:00:00 3 [IU] via SUBCUTANEOUS
  Filled 2021-01-21: qty 3
  Filled 2021-01-21 (×2): qty 6
  Filled 2021-01-21 (×2): qty 3
  Filled 2021-01-21: qty 9
  Filled 2021-01-21 (×2): qty 6
  Filled 2021-01-21 (×2): qty 3
  Filled 2021-01-21 (×2): qty 6

## 2021-01-21 MED ORDER — DEXTROSE 10 % IV BOLUS
250.0000 mL | Freq: Once | INTRAVENOUS | Status: DC
Start: 2021-01-21 — End: 2021-02-02
  Filled 2021-01-21: qty 250

## 2021-01-21 MED ORDER — SERTRALINE HCL 50 MG PO TABS
25.0000 mg | ORAL_TABLET | Freq: Every day | ORAL | Status: DC
Start: 2021-01-21 — End: 2021-01-21

## 2021-01-21 MED ORDER — NALOXONE HCL 0.4 MG/ML IJ SOLN (WRAP)
0.2000 mg | INTRAMUSCULAR | Status: DC | PRN
Start: 2021-01-21 — End: 2021-02-02

## 2021-01-21 MED ORDER — DEXTROSE 5% IV BOLUS
250.0000 mL | INTRAVENOUS | Status: DC | PRN
Start: 2021-01-21 — End: 2021-01-21

## 2021-01-21 MED ORDER — CALCIUM CARBONATE ANTACID 500 MG PO CHEW
1.0000 | CHEWABLE_TABLET | Freq: Four times a day (QID) | ORAL | Status: DC | PRN
Start: 2021-01-21 — End: 2021-02-02
  Filled 2021-01-21 (×4): qty 1

## 2021-01-21 MED ORDER — DEXTROSE 5% IV BOLUS
250.0000 mL | INTRAVENOUS | Status: DC | PRN
Start: 2021-01-21 — End: 2021-02-02

## 2021-01-21 MED ORDER — LIDOCAINE 5 % EX PTCH
1.0000 | MEDICATED_PATCH | CUTANEOUS | Status: DC
Start: 2021-01-21 — End: 2021-02-02
  Filled 2021-01-21 (×6): qty 1

## 2021-01-21 MED ORDER — DEXTROSE 10 % IV BOLUS
25.0000 g | INTRAVENOUS | Status: DC | PRN
Start: 2021-01-21 — End: 2021-01-21

## 2021-01-21 MED ORDER — CALCIUM CARBONATE ANTACID 500 MG PO CHEW
1.0000 | CHEWABLE_TABLET | Freq: Every day | ORAL | Status: DC
Start: 2021-01-21 — End: 2021-01-21

## 2021-01-21 MED ORDER — VENLAFAXINE HCL 25 MG PO TABS
75.0000 mg | ORAL_TABLET | Freq: Every evening | ORAL | Status: DC
Start: 2021-01-21 — End: 2021-02-02
  Administered 2021-01-21 – 2021-02-01 (×12): 75 mg via ORAL
  Filled 2021-01-21 (×13): qty 3

## 2021-01-21 MED ORDER — ALBUTEROL SULFATE (2.5 MG/3ML) 0.083% IN NEBU
2.5000 mg | INHALATION_SOLUTION | Freq: Once | RESPIRATORY_TRACT | Status: AC
Start: 2021-01-21 — End: 2021-01-21
  Administered 2021-01-21: 14:00:00 2.5 mg via RESPIRATORY_TRACT
  Filled 2021-01-21: qty 3

## 2021-01-21 MED ORDER — ALBUTEROL SULFATE (2.5 MG/3ML) 0.083% IN NEBU
2.5000 mg | INHALATION_SOLUTION | Freq: Four times a day (QID) | RESPIRATORY_TRACT | Status: DC
Start: 2021-01-21 — End: 2021-01-22
  Administered 2021-01-21 – 2021-01-22 (×4): 2.5 mg via RESPIRATORY_TRACT
  Filled 2021-01-21 (×4): qty 3

## 2021-01-21 MED ORDER — VALSARTAN 80 MG PO TABS
40.0000 mg | ORAL_TABLET | Freq: Every day | ORAL | Status: DC
Start: 2021-01-21 — End: 2021-01-21

## 2021-01-21 MED ORDER — FLUTICASONE PROPIONATE 50 MCG/ACT NA SUSP
1.0000 | Freq: Every day | NASAL | Status: DC
Start: 2021-01-22 — End: 2021-02-02
  Administered 2021-01-22 – 2021-02-02 (×12): 1 via NASAL
  Filled 2021-01-21 (×2): qty 16

## 2021-01-21 MED ORDER — POTASSIUM & SODIUM PHOSPHATES 280-160-250 MG PO PACK
2.0000 | PACK | ORAL | Status: DC | PRN
Start: 2021-01-21 — End: 2021-02-02

## 2021-01-21 MED ORDER — MAGNESIUM SULFATE IN D5W 1-5 GM/100ML-% IV SOLN
1.0000 g | INTRAVENOUS | Status: DC | PRN
Start: 2021-01-21 — End: 2021-02-02

## 2021-01-21 MED ORDER — MORPHINE SULFATE 2 MG/ML IJ/IV SOLN (WRAP)
2.0000 mg | Freq: Once | Status: AC
Start: 2021-01-21 — End: 2021-01-21
  Administered 2021-01-21: 16:00:00 2 mg via INTRAVENOUS
  Filled 2021-01-21: qty 1

## 2021-01-21 MED ORDER — HYDRALAZINE HCL 20 MG/ML IJ SOLN
10.0000 mg | Freq: Four times a day (QID) | INTRAMUSCULAR | Status: DC | PRN
Start: 2021-01-21 — End: 2021-02-02
  Administered 2021-01-25: 23:00:00 10 mg via INTRAVENOUS
  Filled 2021-01-21: qty 1

## 2021-01-21 MED ORDER — POTASSIUM CHLORIDE 10 MEQ/100ML IV SOLN
10.0000 meq | INTRAVENOUS | Status: DC | PRN
Start: 2021-01-21 — End: 2021-02-02

## 2021-01-21 MED ORDER — SODIUM CHLORIDE 0.9 % IV SOLN
Freq: Once | INTRAVENOUS | Status: AC
Start: 2021-01-21 — End: 2021-01-21

## 2021-01-21 MED ORDER — DEXTROSE 50 % IV SOLN
25.0000 g | INTRAVENOUS | Status: DC | PRN
Start: 2021-01-21 — End: 2021-02-02

## 2021-01-21 NOTE — ED Notes (Signed)
[facility]  ED NURSING NOTE FOR THE RECEIVING INPATIENT NURSE   ED NURSE Maryln Manuel [id number]   ED CHARGE RN Arlys John   ADMISSION INFORMATION   Vanessa Kelly is a 80 y.o. female admitted with an ED diagnosis of:    1. Shortness of breath    2. Acute bilateral low back pain without sciatica         Isolation: None   Allergies: Fentanyl and Percolone [oxycodone]   Holding Orders confirmed? Yes   Belongings Documented? Yes   Home medications sent to pharmacy confirmed? N/A   NURSING CARE   Patient Comes From:   Mental Status: Assisted Living  alert and oriented   ADL: Needs assistance with ADLs   Ambulation: Unable to assess due to patient pain. Frequent falls.   Pertinent Information  and Safety Concerns: Was hypoglycemic upon arrival. Fall risk     CT / NIH   CT Head ordered on this patient?  No   NIH/Dysphagia assessment done prior to admission? No   VITAL SIGNS (at the time of this note)      Vitals:    01/21/21 1617   BP: 168/68   Pulse: 62   Resp: 16   Temp: 98.3 F (36.8 C)   SpO2: 98%

## 2021-01-21 NOTE — ED Provider Notes (Signed)
ED PHYSICIAN ASSIGNED       Date/Time Event User Comments    01/21/21 1410 Physician Assigned Windell Hummingbird. Windell Hummingbird, DO assigned as Attending                History     Chief Complaint   Patient presents with    Shortness of Breath     Per patient, one plus week of progressive cough and worsening SOB, feeling like it is "difficult to breath and take a breath".  No CP.  No fevers.  Denies N/V/D, abdominal pain.  No weakness/numbness.  Did have fall on Thursday when she lost her balance trying to plug something in.  Did not hit head, no LOC.  Having some intermittent back pain after fall (lower).  Uses walker at baseline.     The history is provided by the patient and the EMS personnel. No language interpreter was used.   Shortness of Breath  Severity:  Moderate  Onset quality:  Gradual  Duration:  1 week  Timing:  Constant  Progression:  Worsening  Chronicity:  New  Context: URI    Ineffective treatments:  None tried  Associated symptoms: cough and sputum production    Associated symptoms: no abdominal pain, no chest pain, no fever, no hemoptysis, no vomiting and no wheezing       Past Medical History:   Diagnosis Date    Diabetes mellitus     Gastroesophageal reflux disease     Hyperlipidemia     Hypertension     Seasonal allergic rhinitis        History reviewed. No pertinent surgical history.    Family History   Problem Relation Age of Onset    Heart failure Mother     Myocardial Infarction Father        Social  Social History     Tobacco Use    Smoking status: Former     Types: Cigarettes     Quit date: 1990     Years since quitting: 32.7    Smokeless tobacco: Never   Vaping Use    Vaping Use: Never used   Substance Use Topics    Alcohol use: Not Currently    Drug use: Never       .     Allergies   Allergen Reactions    Fentanyl Anaphylaxis     Hives    Percolone [Oxycodone]        Home Medications               acetaminophen (TYLENOL) 325 MG tablet     Take 650 mg by mouth     aspirin EC 81 MG EC  tablet     Take 81 mg by mouth daily     calcium carbonate (TUMS) 500 MG chewable tablet     Chew 1 tablet by mouth daily     candesartan (ATACAND) 4 MG tablet     Take 4 mg by mouth daily     Dulaglutide (Trulicity) 0.75 MG/0.5ML Solution Pen-injector     Inject into the skin     fexofenadine (ALLEGRA) 180 MG tablet     Take 180 mg by mouth daily     fluticasone (FLONASE) 50 MCG/ACT nasal spray     1 spray by Nasal route daily     furosemide (LASIX) 40 MG tablet     Take 40 mg by mouth as needed     lovastatin (MEVACOR)  40 MG tablet     Take 40 mg by mouth nightly     multivitamin-iron-minerals-folic acid (CENTRUM) chewable tablet     Chew 1 tablet by mouth daily     pantoprazole (PROTONIX) 40 MG tablet     Take 40 mg by mouth daily     pregabalin (LYRICA) 25 MG capsule          ranolazine (RANEXA) 500 MG 12 hr tablet     Take 500 mg by mouth 2 (two) times daily     sertraline (ZOLOFT) 25 MG tablet     Take 25 mg by mouth daily     traMADol (ULTRAM) 50 MG tablet     Take 50 mg by mouth every 6 (six) hours as needed for Pain     vitamin D (CHOLECALCIFEROL) 25 MCG (1000 UT) tablet     Take by mouth daily 50,000 units once a week     zolpidem (AMBIEN CR) 12.5 MG CR tablet     Take 5 mg by mouth daily             Review of Systems   Constitutional:  Negative for fever.   Respiratory:  Positive for cough, sputum production and shortness of breath. Negative for hemoptysis and wheezing.    Cardiovascular:  Negative for chest pain.   Gastrointestinal:  Negative for abdominal pain and vomiting.   All other systems reviewed and are negative.    Physical Exam    BP: 186/77, Heart Rate: 64, Temp: 98.5 F (36.9 C), Resp Rate: 18, SpO2: 100 %    Physical Exam  Vitals and nursing note reviewed.   Constitutional:       General: She is not in acute distress.     Appearance: She is well-developed. She is not ill-appearing.   HENT:      Head: Normocephalic and atraumatic.      Mouth/Throat:      Mouth: Mucous membranes are moist.       Pharynx: Oropharynx is clear.   Eyes:      Extraocular Movements: Extraocular movements intact.      Pupils: Pupils are equal, round, and reactive to light.   Cardiovascular:      Rate and Rhythm: Normal rate and regular rhythm.      Pulses: Normal pulses.   Pulmonary:      Effort: Pulmonary effort is normal. No respiratory distress.      Breath sounds: Examination of the right-upper field reveals wheezing. Examination of the left-upper field reveals wheezing. Examination of the right-middle field reveals wheezing. Examination of the left-middle field reveals wheezing. Examination of the right-lower field reveals wheezing. Examination of the left-lower field reveals wheezing. Wheezing present.   Chest:      Chest wall: No tenderness.   Abdominal:      Palpations: Abdomen is soft.      Tenderness: There is no abdominal tenderness. There is no guarding or rebound.   Musculoskeletal:         General: Normal range of motion.      Cervical back: Normal range of motion and neck supple.      Right lower leg: No edema.      Left lower leg: No edema.   Skin:     General: Skin is warm and dry.      Capillary Refill: Capillary refill takes less than 2 seconds.      Findings: No rash.   Neurological:  General: No focal deficit present.      Mental Status: She is alert and oriented to person, place, and time.      Cranial Nerves: No cranial nerve deficit.      Motor: No weakness.   Psychiatric:         Mood and Affect: Mood normal.         Behavior: Behavior normal.         MDM and ED Course     ED Medication Orders (From admission, onward)      Start Ordered     Status Ordering Provider    01/21/21 1652 01/21/21 1652  iohexol (OMNIPAQUE) 350 MG/ML injection 80 mL  IMG once as needed        Route: Intravenous  Ordered Dose: 80 mL     Last MAR action: Imaging Agent Given Windell Hummingbird    01/21/21 1538 01/21/21 1537  morphine injection 2 mg  Once        Route: Intravenous  Ordered Dose: 2 mg     Last MAR action: Given  Leiloni Smithers G    01/21/21 1425 01/21/21 1424  albuterol (PROVENTIL) (2.5 MG/3ML) 0.083% nebulizer solution 2.5 mg  RT - Once        Route: Nebulization  Ordered Dose: 2.5 mg     Last MAR action: Given Del Overfelt G    01/21/21 1425 01/21/21 1424  ipratropium (ATROVENT) 0.02 % nebulizer solution 0.5 mg  RT - Once        Route: Nebulization  Ordered Dose: 0.5 mg     Last MAR action: Given Andreas Sobolewski G    01/21/21 1425 01/21/21 1424  dextrose (D10W) 10% bolus 250 mL  Once        Route: Intravenous  Ordered Dose: 250 mL     Last MAR action: Hold Waylon Hershey G               MDM  Number of Diagnoses or Management Options  Acute bilateral low back pain without sciatica  Hiatal hernia  Lesion of vertebra  Shortness of breath  Diagnosis management comments: BP 186/77   Pulse 64   Temp 98.5 F (36.9 C) (Oral)   Resp 18   Ht 5\' 9"  (1.753 m)   SpO2 100%   BMI 26.70 kg/m     EKG: read/interpret by EDMD; sinus brady @ 59, 1st degree block, no STEMI    HDS, afebrile w/ worsening SOB and chest pressure w/ recent URI symptoms and expiratory wheeze on exam.  PO tolerant and well appearing.  No respiratory distress in ED.  CT imaging w/o e/o PNA or acute findings.  Exam improved w/ neb in ED.  TROP/BNP negative.  EKG sinus brady w/ 1st degree block.  On re-evaluation patient still c/o SOB and hard time breathing, no respiratory distress in ED, and does not feel safe going home.  Will admit for OBS given age, risk factors and continued symptoms although favor likely related to recent URI and bronchospasm.  As for low back pain, CT w/o acute fracture.  Will need follow up for additional findings.    Case d/w Verne Grain, MD  Case d/w Jerlyn Ly, MD    Results for orders placed or performed during the hospital encounter of 01/21/21  -COVID-19 (SARS-CoV-2) only ([facility]) asymptomatic admission - Hospitals:   Specimen: Nasopharyngeal       Result  Value                         Ref  Range                       Purpose of COVID testing                          Screening                                                     SARS-CoV-2 Specimen Source                        Nasal Swab                                                    SARS CoV 2 Overall Result                         Not Detected                                                                                                                                                               Narrative    o Collect and clearly label specimen type:    o PREFERRED-Upper respiratory specimen: One Nasal Swab in    [employer].    o Hand deliver to laboratory ASAP    Indication for testing->Extended care facility admission to    semi private room    Screening  -CBC and differential:        Result                                            Value                         Ref Range                       WBC  5.91                          3.10 - 9.50 x10 3/uL            Hgb                                               10.0 (L)                      11.4 - 14.8 g/dL                Hematocrit                                        30.4 (L)                      34.7 - 43.7 %                   Platelets                                         191                           142 - 346 x10 3/uL              RBC                                               3.03 (L)                      3.90 - 5.10 x10 6/uL            MCV                                               100.3 (H)                     78.0 - 96.0 fL                  MCH                                               33.0                          25.1 - 33.5 pg                  MCHC  32.9                          31.5 - 35.8 g/dL                RDW                                               13                            11 - 15 %                       MPV                                               10.4                           8.9 - 12.5 fL                   Neutrophils                                       54.3                          None %                          Lymphocytes Automated                             31.5                          None %                          Monocytes                                         8.6                           None %                          Eosinophils Automated                             4.9                           None %                          Basophils Automated  0.5                           None %                          Immature Granulocytes                             0.2                           None %                          Nucleated RBC                                     0.0                           0.0 - 0.0 /100 WBC              Neutrophils Absolute                              3.21                          1.10 - 6.33 x10 3/uL            Lymphocytes Absolute Automated                    1.86                          0.42 - 3.22 x10 3/uL            Monocytes Absolute Automated                      0.51                          0.21 - 0.85 x10 3/uL            Eosinophils Absolute Automated                    0.29                          0.00 - 0.44 x10 3/uL            Basophils Absolute Automated                      0.03                          0.00 - 0.08 x10 3/uL            Immature Granulocytes Absolute                    0.01  0.00 - 0.07 x10 3/uL            Absolute NRBC                                     0.00                          0.00 - 0.00 x10 3/uL       -D-Dimer:        Result                                            Value                         Ref Range                       D-Dimer                                           0.83 (H)                      0.00 - 0.60 ug/mL FEU      -Comprehensive metabolic panel:        Result                                             Value                         Ref Range                       Glucose                                           89                            70 - 100 mg/dL                  BUN                                               32.0 (H)                      7.0 - 21.0 mg/dL                Creatinine                                        1.4 (H)  0.4 - 1.0 mg/dL                 Sodium                                            133 (L)                       135 - 145 mEq/L                 Potassium                                         5.4 (H)                       3.5 - 5.3 mEq/L                 Chloride                                          102                           99 - 111 mEq/L                  CO2                                               25                            17 - 29 mEq/L                   Calcium                                           9.7                           7.9 - 10.2 mg/dL                Protein, Total                                    6.9                           6.0 - 8.3 g/dL                  Albumin                                           3.6  3.5 - 5.0 g/dL                  AST (SGOT)                                        22                            5 - 41 U/L                      ALT                                               27                            0 - 55 U/L                      Alkaline Phosphatase                              84                            37 - 117 U/L                    Bilirubin, Total                                  0.3                           0.2 - 1.2 mg/dL                 Globulin                                          3.3                           2.0 - 3.6 g/dL                  Albumin/Globulin Ratio                            1.1                           0.9 - 2.2                       Anion Gap                                         6.0  5.0 - 15.0                  -B-type Natriuretic Peptide:        Result                                            Value                         Ref Range                       B-Natriuretic Peptide                             71                            0 - 100 pg/mL              -Magnesium:        Result                                            Value                         Ref Range                       Magnesium                                         1.8                           1.6 - 2.6 mg/dL            -Troponin I:        Result                                            Value                         Ref Range                       Troponin I                                        <0.01                         0.00 - 0.05 ng/mL          -Lipase:        Result  Value                         Ref Range                       Lipase                                            31                            8 - 78 U/L                 -GFR:        Result                                            Value                         Ref Range                       EGFR                                              43.5                                                     -Glucose Whole Blood - POCT:        Result                                            Value                         Ref Range                       Whole Blood Glucose POCT                          51 (LL)                       70 - 100 mg/dL             -Glucose Whole Blood - POCT:        Result                                            Value                         Ref Range  Whole Blood Glucose POCT                          130 (H)                       70 - 100 mg/dL               Results for orders placed or performed during the hospital encounter of 01/21/21  -CT Angio Chest (PE study):                                                                                                                      Narrative    CLINICAL INDICATION:  Shortness of breath, elevated d-dimer        TECHNIQUE:  CTA of the chest was performed after the uneventful    administration of 80 cc of nonionic intravenous contrast. 3D post    processing was performed.  A combination of a automatic exposure    control, adjustment of the mA and/or kV  according to patient size    and/or use of iterative reconstruction technique was utilized.            COMPARISON: None available        INTERPRETATION:        Pulmonary arteries: The pulmonary arteries are adequately opacified and    no intraluminal filling defects are identified.        CARDIOVASCULAR: Heart size is enlarged. There are scattered areas of    calcified plaque in the coronary arteries. There is moderate    atherosclerotic calcific changes of the thoracic aorta without evidence    of aneurysm. No pericardial effusion.        Esophagus: Large hiatal hernia with partial intrathoracic location of    the stomach.        Pleura: No pleural effusion.        Lymph nodes: No axillary or thoracic lymphadenopathy.        Thyroid gland: Unremarkable.        Central airways and lungs: The central airways are clear. Lungs are    clear. No focal infiltrate. No pneumothorax or pneumomediastinum.        Limited images of the upper abdomen: There is a 1 cm cyst in the upper    pole left kidney. The gallbladder has been removed. Remainder the    visualized upper abdomen is unremarkable.        Bones: Sternotomy wires are noted. Right shoulder prosthesis is present.    No destructive osseous lesions.  Impression    1. No evidence for pulmonary embolism.    2. Large hiatal hernia with partial intrathoracic location of the    stomach.        Wyatt Portela, MD     01/21/2021 5:01 PM  -CT L- Spine without Contrast:                                                                                                                      Narrative    CT THORACIC AND LUMBAR SPINE        CLINICAL STATEMENT: Fall. Back pain.         COMPARISON: MRI of the lumbar spine performed on 12/17/2020.         TECHNIQUE: Multiple thin section axial images were obtained from the    lower cervical spine to the sacrum, without intravenous contrast.    Coronal and sagittal reconstructions were then obtained.        Note that CT scanning at this site utilizes multiple dose reduction    techniques including automatic exposure control, adjustment of the MAS    and/or KVP according to patient size, and use of iterative    reconstruction technique.        FINDINGS: Severe multilevel endplate degenerative changes are noted of    the cervical spine with multilevel disc height loss, endplate sclerosis    and osteophyte formation.        THORACIC SPINE: No fracture is identified. There is appropriate    alignment and height of the vertebral bodies. Small anterior endplate    osteophytes are noted throughout the mid thoracic spine. The paraspinal    soft tissues are unremarkable.                LUMBAR SPINE: No acute fracture is identified. There is appropriate    alignment and height of the vertebral bodies.        A sclerotic lesion is noted throughout the posterior aspect of the L5    vertebral body. The paraspinal soft tissues are unremarkable.        At the T12-L1 level there is no significant disc herniation, canal    stenosis or neural foraminal narrowing.        At the L1-2 level there is no significant disc herniation, canal    stenosis or neural foraminal narrowing.         At the L2-3 level there is a mild disc bulge and mild degenerative    changes of the facet joints with infolding of ligamentum flavum. Mild    canal stenosis is noted at this level with mild bilateral neural    foraminal narrowing.         At the L3-4 level there is a moderate-sized disc bulge is identified    with mild posterior endplate osteophytosis and  severe hypertrophic    degenerative changes of the facet joints. Postsurgical changes are noted    of the posterior elements.  There is mild/moderate canal stenosis at this    level with severe bilateral neural foraminal narrowing.         At the L4-5 level there is a mild disc bulge with associated posterior    endplate osteophytosis and severe degenerative changes of the facet    joints. Postsurgical changes are noted of the posterior elements. There    is mild canal stenosis level with moderate/severe bilateral neural    foraminal narrowing.         At the L5-S1 level there is a mild disc bulge is identified with    associated prominent posterior endplate osteophytosis and moderate    degenerative changes of the facet joints. There is mild canal stenosis    at this level and there is severe bilateral neural foraminal narrowing.                                                                                                                         Impression    1. No evidence of lumbar spine fracture.    2. 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    3. Indeterminate sclerotic lesion within the L5 vertebral body. Please    correlate with possible history of possible history of primary neoplasm.    Whole-body bone scan date performed to further evaluate this lesion.        Fonnie Mu, DO     01/21/2021 5:11 PM  -CT Reconstruction T-spine:                                                                                                                     Narrative    CT THORACIC AND LUMBAR SPINE        CLINICAL STATEMENT: Fall. Back pain.         COMPARISON: MRI of the lumbar spine performed on 12/17/2020.         TECHNIQUE: Multiple thin section axial images were obtained from the    lower cervical spine to the sacrum, without intravenous contrast.    Coronal and sagittal reconstructions were then obtained.        Note that  CT scanning at this site utilizes multiple dose reduction    techniques including automatic exposure control, adjustment of the MAS    and/or KVP according to patient size, and use of iterative    reconstruction technique.  FINDINGS: Severe multilevel endplate degenerative changes are noted of    the cervical spine with multilevel disc height loss, endplate sclerosis    and osteophyte formation.        THORACIC SPINE: No fracture is identified. There is appropriate    alignment and height of the vertebral bodies. Small anterior endplate    osteophytes are noted throughout the mid thoracic spine. The paraspinal    soft tissues are unremarkable.                LUMBAR SPINE: No acute fracture is identified. There is appropriate    alignment and height of the vertebral bodies.        A sclerotic lesion is noted throughout the posterior aspect of the L5    vertebral body. The paraspinal soft tissues are unremarkable.        At the T12-L1 level there is no significant disc herniation, canal    stenosis or neural foraminal narrowing.        At the L1-2 level there is no significant disc herniation, canal    stenosis or neural foraminal narrowing.         At the L2-3 level there is a mild disc bulge and mild degenerative    changes of the facet joints with infolding of ligamentum flavum. Mild    canal stenosis is noted at this level with mild bilateral neural    foraminal narrowing.         At the L3-4 level there is a moderate-sized disc bulge is identified    with mild posterior endplate osteophytosis and severe hypertrophic    degenerative changes of the facet joints. Postsurgical changes are noted    of the posterior elements. There is mild/moderate canal stenosis at this    level with severe bilateral neural foraminal narrowing.         At the L4-5 level there is a mild disc bulge with associated posterior    endplate osteophytosis and severe degenerative changes of the facet    joints. Postsurgical changes are  noted of the posterior elements. There    is mild canal stenosis level with moderate/severe bilateral neural    foraminal narrowing.         At the L5-S1 level there is a mild disc bulge is identified with    associated prominent posterior endplate osteophytosis and moderate    degenerative changes of the facet joints. There is mild canal stenosis    at this level and there is severe bilateral neural foraminal narrowing.                                                                                                                         Impression    1. No evidence of lumbar spine fracture.    2. 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    3. Indeterminate  sclerotic lesion within the L5 vertebral body. Please    correlate with possible history of possible history of primary neoplasm.    Whole-body bone scan date performed to further evaluate this lesion.        Fonnie Mu, DO     01/21/2021 5:11 PM    D/W patient ED course, treatment plan, lab/imaging results, indications/reasons for observation.  Patient understands and agrees with plan of care.         Amount and/or Complexity of Data Reviewed  Clinical lab tests: reviewed and ordered  Tests in the radiology section of CPT: reviewed  Obtain history from someone other than the patient: yes  Review and summarize past medical records: yes  Discuss the patient with other providers: yes    Patient Progress  Patient progress: improved                   Procedures    Clinical Impression & Disposition     Clinical Impression  Final diagnoses:   Shortness of breath   Acute bilateral low back pain without sciatica   Hiatal hernia   Lesion of vertebra   Anemia, unspecified type   Hyponatremia   Hypoglycemia        ED Disposition       ED Disposition   Observation    Condition   --    Date/Time   Thu Jan 21, 2021  4:56 PM    Comment   Admitting Physician: Saddie Benders  [[id number]]   Service:: Medicine [[id number]]   Estimated Length of Stay: < 2 midnights   Tentative Discharge Plan?: Home or Self Care [1]   Does patient need telemetry?: Yes   Telemetry type (separate Telemetry order is also required):: Cardiac telemetry                  New Prescriptions    No medications on file                   Windell Hummingbird, DO  01/21/21 1720

## 2021-01-21 NOTE — H&P (Signed)
ADMISSION HISTORY AND PHYSICAL EXAM    [facility], DIVISION OF HOSPITALIST MEDICINE   [facility]   [place] Pager: [phone]      Date Time: 01/21/21 6:33 PM  Patient Name: Vanessa Kelly  Attending Physician: Saddie Benders, MD  Primary Care Physician: Pcp, None, MD    CC: Worsening shortness of breath and cough  History Gathered From: Self    Assessment:     Active Hospital Problems    Diagnosis    Shortness of breath       Vanessa Kelly is a 80 y.o. 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  Per patient, cough started about a week back, has resolved after 3 to 4 days with Mucinex and started getting worse and was productive of mucopurulent sputum which prompted her to come to emergency room.    Vitals and labs reviewed:  -No fever/leukocytosis.  H&H: 10.0/30.4.  She was hypoglycemic to: 55 extremities mentation improved to 130.  -Acute renal failure with a serum creatinine of: 1.4 [was 1.1 in 05/2020 continues to creatinine].  -Hyperkalemia with a serum potassium of: 5.4.  -CT angiogram was negative for pulmonary embolism but is significant for large hiatal hernia.  -CT of the lumbar spine is negative for acute fracture but is significant for degenerative changes and Indeterminate sclerotic lesion within the L5 vertebral body    Upon examination of the patient in emergency room, she appears to be in no acute cardiopulmonary distress.  She is still complaining of cough productive of mucopurulent sputum, and shortness of breath.  Complaining of back pain as well.  No focal neurological symptoms.    Assessment:  -Shortness of breath and cough: Reason for presentation.  Likely secondary to bronchitis.  -Possible exacerbation of reactive airway  disease.  Patient has no diagnosis of asthma, quit smoking about 30 years back.  Has extensive bilateral expiratory wheezing.  -Acute renal failure with the potential for worsening secondary to contrast exposure.  -Hyperkalemia.  -Degenerative lumbar spinal disease.  -Indeterminate sclerotic lesion within the L5 vertebral body.  -Asymptomatic moderate carotid stenosis.  -CAD s/p CABG and PCI.  -Hypertension.  -Diabetes mellitus with an episode of hypoglycemia at the time of presentation.  Plan:   -Azithromycin for possible bronchitis.  -Oxygen supplementation as needed.  -Adequate pain control with Tylenol and lidocaine patch for the back.  -Nebs and IV steroids  -Gentle IV hydration and monitor renal function.  -Kayexalate x1 and monitor serum potassium.  -Outpatient vascular surgery follow-up for further evaluation of L5 vertebral body lesion.  -Continued outpatient follow-up with vascular surgery for moderate carotid stenosis.  -Hold off on Lasix/losartan for now secondary to acute renal failure.  Hydralazine as needed to maintain < 160 mm  of HG.  -Maintain euglycemia with sliding scale insulin.  -Continue rest of the medications with her chronic medical comorbidities as before.    Nutrition:Diet Consistent Carbohydrate and Heart Healthy    Code status: Full code     Status/Disposition:   Pt is admitted under observation with above concerns.    Anticipated medical stability for discharge: 24 Hrs           Past Medical Histor     Past Medical History:   Diagnosis Date  Diabetes mellitus     Gastroesophageal reflux disease     Hyperlipidemia     Hypertension     Seasonal allergic rhinitis          Available old records reviewed, including: EPIC     Past Surgical History:   History reviewed. No pertinent surgical history.    Family History:     Family History   Problem Relation Age of Onset    Heart failure Mother     Myocardial Infarction Father        Social History:    reports that she quit smoking about 32  years ago. Her smoking use included cigarettes. She has never used smokeless tobacco. She reports that she does not currently use alcohol. She reports that she does not use drugs.    Allergies:     Allergies   Allergen Reactions    Fentanyl Anaphylaxis     Hives    Percolone [Oxycodone]        Medications:     Home Medications               acetaminophen (TYLENOL) 325 MG tablet     Take 650 mg by mouth     aspirin EC 81 MG EC tablet     Take 81 mg by mouth daily     calcium carbonate (TUMS) 500 MG chewable tablet     Chew 1 tablet by mouth daily     candesartan (ATACAND) 4 MG tablet     Take 4 mg by mouth daily     Dulaglutide (Trulicity) 0.75 MG/0.5ML Solution Pen-injector     Inject into the skin     fexofenadine (ALLEGRA) 180 MG tablet     Take 180 mg by mouth daily     fluticasone (FLONASE) 50 MCG/ACT nasal spray     1 spray by Nasal route daily     furosemide (LASIX) 40 MG tablet     Take 40 mg by mouth as needed     lovastatin (MEVACOR) 40 MG tablet     Take 40 mg by mouth nightly     multivitamin-iron-minerals-folic acid (CENTRUM) chewable tablet     Chew 1 tablet by mouth daily     pantoprazole (PROTONIX) 40 MG tablet     Take 40 mg by mouth daily     pregabalin (LYRICA) 25 MG capsule          ranolazine (RANEXA) 500 MG 12 hr tablet     Take 500 mg by mouth 2 (two) times daily     sertraline (ZOLOFT) 25 MG tablet     Take 25 mg by mouth daily     traMADol (ULTRAM) 50 MG tablet     Take 50 mg by mouth every 6 (six) hours as needed for Pain     vitamin D (CHOLECALCIFEROL) 25 MCG (1000 UT) tablet     Take by mouth daily 50,000 units once a week     zolpidem (AMBIEN CR) 12.5 MG CR tablet     Take 5 mg by mouth daily              Review of Systems:   All other systems were reviewed and are negative except:as above in HPI     Physical Exam:   Patient Vitals for the past 24 hrs:   BP Temp Temp src Pulse Resp SpO2 Height   01/21/21 1617 168/68 98.3 F (36.8 C) Oral 62 16 98 % --  01/21/21 1615 -- -- -- 61 19 98 %  --   01/21/21 1600 179/74 -- -- (!) 59 18 99 % --   01/21/21 1530 -- -- -- 68 13 97 % --   01/21/21 1412 186/77 98.5 F (36.9 C) Oral 64 18 100 % 1.753 m (5\' 9" )     Body mass index is 26.7 kg/m.  No intake or output data in the 24 hours ending 01/21/21 1833    Gen: NAD?  Eyes: Pupils are reactive to light  Throat: Moist mucus membranes  Neck: supple  Chest: S1 S2 +?  Resp: CTA b/l?,  Extensive bilateral expiratory wheezing  Abd: Soft, non tender.   Neuro: Awake, alert. Appropriately conversive, following simple commands.?  Extremities: No pedal edema      Labs:     Results       Procedure Component Value Units Date/Time    Troponin I [[id number]] Collected: 01/21/21 1430    Specimen: Blood Updated: 01/21/21 1514     Troponin I <0.01 ng/mL     B-type Natriuretic Peptide [[id number]] Collected: 01/21/21 1430    Specimen: Blood Updated: 01/21/21 1513     B-Natriuretic Peptide 71 pg/mL     D-Dimer [[id number]]  (Abnormal) Collected: 01/21/21 1430     Updated: 01/21/21 1508     D-Dimer 0.83 ug/mL FEU     GFR [[id number]] Collected: 01/21/21 1430     Updated: 01/21/21 1508     EGFR 43.5    Comprehensive metabolic panel [[id number]]  (Abnormal) Collected: 01/21/21 1430    Specimen: Blood Updated: 01/21/21 1508     Glucose 89 mg/dL      BUN 24.4 mg/dL      Creatinine 1.4 mg/dL      Sodium 010 mEq/L      Potassium 5.4 mEq/L      Chloride 102 mEq/L      CO2 25 mEq/L      Calcium 9.7 mg/dL      Protein, Total 6.9 g/dL      Albumin 3.6 g/dL      AST (SGOT) 22 U/L      ALT 27 U/L      Alkaline Phosphatase 84 U/L      Bilirubin, Total 0.3 mg/dL      Globulin 3.3 g/dL      Albumin/Globulin Ratio 1.1     Anion Gap 6.0    Magnesium [[id number]] Collected: 01/21/21 1430    Specimen: Blood Updated: 01/21/21 1508     Magnesium 1.8 mg/dL     Lipase [[id number]] Collected: 01/21/21 1430    Specimen: Blood Updated: 01/21/21 1508     Lipase 31 U/L     COVID-19 (SARS-CoV-2) only ([facility]) asymptomatic admission - Hospitals [[id number]]  Collected: 01/21/21 1432    Specimen: Nasopharyngeal Updated: 01/21/21 1503     Purpose of COVID testing Screening     SARS-CoV-2 Specimen Source Nasal Swab     SARS CoV 2 Overall Result Not Detected    Narrative:      o Collect and clearly label specimen type:  o PREFERRED-Upper respiratory specimen: One Nasal Swab in  [employer].  o Hand deliver to laboratory ASAP  Indication for testing->Extended care facility admission to  semi private room  Screening    Glucose Whole Blood - POCT [[id number]]  (Abnormal) Collected: 01/21/21 1456     Updated: 01/21/21 1458     Whole Blood Glucose POCT 130 mg/dL     CBC and differential [  [id number]]  (Abnormal) Collected: 01/21/21 1430    Specimen: Blood Updated: 01/21/21 1445     WBC 5.91 x10 3/uL      Hgb 10.0 g/dL      Hematocrit 74.2 %      Platelets 191 x10 3/uL      RBC 3.03 x10 6/uL      MCV 100.3 fL      MCH 33.0 pg      MCHC 32.9 g/dL      RDW 13 %      MPV 10.4 fL      Neutrophils 54.3 %      Lymphocytes Automated 31.5 %      Monocytes 8.6 %      Eosinophils Automated 4.9 %      Basophils Automated 0.5 %      Immature Granulocytes 0.2 %      Nucleated RBC 0.0 /100 WBC      Neutrophils Absolute 3.21 x10 3/uL      Lymphocytes Absolute Automated 1.86 x10 3/uL      Monocytes Absolute Automated 0.51 x10 3/uL      Eosinophils Absolute Automated 0.29 x10 3/uL      Basophils Absolute Automated 0.03 x10 3/uL      Immature Granulocytes Absolute 0.01 x10 3/uL      Absolute NRBC 0.00 x10 3/uL     Glucose Whole Blood - POCT [[id number]]  (Abnormal) Collected: 01/21/21 1413     Updated: 01/21/21 1419     Whole Blood Glucose POCT 51 mg/dL             Imaging personally reviewed, including: all available   CT Reconstruction T-spine    Result Date: 01/21/2021  1. No evidence of lumbar spine fracture. 2. Multilevel degenerative changes throughout the thoracic and lumbar spine most severely affecting the mid and lower lumbar spine. 3. Indeterminate sclerotic lesion within the L5  vertebral body. Please correlate with possible history of possible history of primary neoplasm. Whole-body bone scan date performed to further evaluate this lesion. Fonnie Mu, DO  01/21/2021 5:11 PM    CT Angio Chest (PE study)    Result Date: 01/21/2021  1. No evidence for pulmonary embolism. 2. Large hiatal hernia with partial intrathoracic location of the stomach. Wyatt Portela, MD  01/21/2021 5:01 PM    CT L- Spine without Contrast    Result Date: 01/21/2021  1. No evidence of lumbar spine fracture. 2. Multilevel degenerative changes throughout the thoracic and lumbar spine most severely affecting the mid and lower lumbar spine. 3. Indeterminate sclerotic lesion within the L5 vertebral body. Please correlate with possible history of possible history of primary neoplasm. Whole-body bone scan date performed to further evaluate this lesion. Fonnie Mu, DO  01/21/2021 5:11 PM     Safety Checklist  DVT prophylaxis:  CHEST guideline (See page e199S) [employer] and [employer]     This note was generated by the Epic EMR system/ Dragon speech recognition and may contain inherent errors or omissions not intended by the user. Grammatical errors, random word insertions, deletions and pronoun errors  are occasional consequences of this technology due to software limitations. Not all errors are caught or corrected. If there are questions or concerns about the content of this note or information contained within the body of this dictation they should be addressed directly with the author for clarification.    Signed by: Saddie Benders, MD, MD   cc:Pcp, None, MD

## 2021-01-21 NOTE — ED Notes (Signed)
[facility]   ED Nursing Note For The Receiving Inpatient Nurse - UPDATE     The following information is an update to the previous ED Nursing Admission Note:    BP read high after transfer to [facility]. Now 162/70. Pt has not taken daily dose of BP medication . MD notified. Awaiting order    VITAL SIGNS     Vitals:    01/21/21 1942   BP: 162/70   Pulse: 66   Resp: 18   Temp: 98.3 F (36.8 C)   SpO2: 98%

## 2021-01-21 NOTE — ED Notes (Signed)
Repeat blood glucose checked. Pt given sandwich and juice.

## 2021-01-22 LAB — HEMOGLOBIN A1C
Average Estimated Glucose: 105.4 mg/dL
Hemoglobin A1C: 5.3 % (ref 4.6–5.9)

## 2021-01-22 LAB — CBC AND DIFFERENTIAL
Absolute NRBC: 0 10*3/uL (ref 0.00–0.00)
Basophils Absolute Automated: 0 10*3/uL (ref 0.00–0.08)
Basophils Automated: 0 %
Eosinophils Absolute Automated: 0 10*3/uL (ref 0.00–0.44)
Eosinophils Automated: 0 %
Hematocrit: 30.5 % — ABNORMAL LOW (ref 34.7–43.7)
Hgb: 10 g/dL — ABNORMAL LOW (ref 11.4–14.8)
Immature Granulocytes Absolute: 0.02 10*3/uL (ref 0.00–0.07)
Immature Granulocytes: 0.4 %
Lymphocytes Absolute Automated: 0.53 10*3/uL (ref 0.42–3.22)
Lymphocytes Automated: 11.2 %
MCH: 32.6 pg (ref 25.1–33.5)
MCHC: 32.8 g/dL (ref 31.5–35.8)
MCV: 99.3 fL — ABNORMAL HIGH (ref 78.0–96.0)
MPV: 10.6 fL (ref 8.9–12.5)
Monocytes Absolute Automated: 0.03 10*3/uL — ABNORMAL LOW (ref 0.21–0.85)
Monocytes: 0.6 %
Neutrophils Absolute: 4.16 10*3/uL (ref 1.10–6.33)
Neutrophils: 87.8 %
Nucleated RBC: 0 /100 WBC (ref 0.0–0.0)
Platelets: 176 10*3/uL (ref 142–346)
RBC: 3.07 10*6/uL — ABNORMAL LOW (ref 3.90–5.10)
RDW: 13 % (ref 11–15)
WBC: 4.74 10*3/uL (ref 3.10–9.50)

## 2021-01-22 LAB — COMPREHENSIVE METABOLIC PANEL
ALT: 27 U/L (ref 0–55)
AST (SGOT): 21 U/L (ref 5–41)
Albumin/Globulin Ratio: 1 (ref 0.9–2.2)
Albumin: 3.4 g/dL — ABNORMAL LOW (ref 3.5–5.0)
Alkaline Phosphatase: 83 U/L (ref 37–117)
Anion Gap: 6 (ref 5.0–15.0)
BUN: 30 mg/dL — ABNORMAL HIGH (ref 7.0–21.0)
Bilirubin, Total: 0.3 mg/dL (ref 0.2–1.2)
CO2: 24 mEq/L (ref 17–29)
Calcium: 9.8 mg/dL (ref 7.9–10.2)
Chloride: 102 mEq/L (ref 99–111)
Creatinine: 1.3 mg/dL — ABNORMAL HIGH (ref 0.4–1.0)
Globulin: 3.4 g/dL (ref 2.0–3.6)
Glucose: 193 mg/dL — ABNORMAL HIGH (ref 70–100)
Potassium: 5.8 mEq/L — ABNORMAL HIGH (ref 3.5–5.3)
Protein, Total: 6.8 g/dL (ref 6.0–8.3)
Sodium: 132 mEq/L — ABNORMAL LOW (ref 135–145)

## 2021-01-22 LAB — GFR: EGFR: 47.4

## 2021-01-22 LAB — GLUCOSE WHOLE BLOOD - POCT
Whole Blood Glucose POCT: 173 mg/dL — ABNORMAL HIGH (ref 70–100)
Whole Blood Glucose POCT: 149 mg/dL — ABNORMAL HIGH (ref 70–100)
Whole Blood Glucose POCT: 156 mg/dL — ABNORMAL HIGH (ref 70–100)
Whole Blood Glucose POCT: 183 mg/dL — ABNORMAL HIGH (ref 70–100)

## 2021-01-22 LAB — POTASSIUM: Potassium: 4.2 mEq/L (ref 3.5–5.3)

## 2021-01-22 LAB — TSH: TSH: 0.5 u[IU]/mL (ref 0.35–4.94)

## 2021-01-22 MED ORDER — COSYNTROPIN 0.25 MG IJ SOLR
0.2500 mg | Freq: Once | INTRAMUSCULAR | Status: DC
Start: 2021-01-22 — End: 2021-01-22

## 2021-01-22 MED ORDER — COSYNTROPIN 0.25 MG IJ SOLR
0.2500 mg | Freq: Once | INTRAMUSCULAR | Status: AC
Start: 2021-01-22 — End: 2021-01-23
  Administered 2021-01-23: 08:00:00 0.25 mg via INTRAVENOUS
  Filled 2021-01-22 (×2): qty 0.25

## 2021-01-22 MED ORDER — BUDESONIDE 0.5 MG/2ML IN SUSP
0.5000 mg | Freq: Two times a day (BID) | RESPIRATORY_TRACT | Status: DC
Start: 2021-01-22 — End: 2021-02-02
  Administered 2021-01-22 – 2021-02-02 (×21): 0.5 mg via RESPIRATORY_TRACT
  Filled 2021-01-22 (×19): qty 2

## 2021-01-22 MED ORDER — PANTOPRAZOLE SODIUM 40 MG IV SOLR
40.0000 mg | Freq: Two times a day (BID) | INTRAVENOUS | Status: DC
Start: 2021-01-22 — End: 2021-02-02
  Administered 2021-01-22 – 2021-02-02 (×23): 40 mg via INTRAVENOUS
  Filled 2021-01-22 (×23): qty 40

## 2021-01-22 MED ORDER — HYDRALAZINE HCL 25 MG PO TABS
25.0000 mg | ORAL_TABLET | Freq: Two times a day (BID) | ORAL | Status: DC
Start: 2021-01-22 — End: 2021-01-24
  Administered 2021-01-22 – 2021-01-24 (×5): 25 mg via ORAL
  Filled 2021-01-22 (×5): qty 1

## 2021-01-22 MED ORDER — ALBUTEROL-IPRATROPIUM 2.5-0.5 (3) MG/3ML IN SOLN
3.0000 mL | Freq: Four times a day (QID) | RESPIRATORY_TRACT | Status: DC
Start: 2021-01-22 — End: 2021-02-02
  Administered 2021-01-22 – 2021-02-02 (×42): 3 mL via RESPIRATORY_TRACT
  Filled 2021-01-22 (×28): qty 3

## 2021-01-22 MED ORDER — SODIUM POLYSTYRENE SULFONATE 15 GM/60ML PO SUSP
45.0000 g | Freq: Once | ORAL | Status: AC
Start: 2021-01-22 — End: 2021-01-22
  Administered 2021-01-22: 11:00:00 45 g via ORAL
  Filled 2021-01-22: qty 180

## 2021-01-22 MED ORDER — GUAIFENESIN-CODEINE 100-10 MG/5ML PO SYRP
5.0000 mL | ORAL_SOLUTION | ORAL | Status: AC | PRN
Start: 2021-01-22 — End: 2021-01-23
  Administered 2021-01-22 – 2021-01-23 (×2): 5 mL via ORAL
  Filled 2021-01-22 (×2): qty 5

## 2021-01-22 MED ORDER — ALBUTEROL SULFATE (2.5 MG/3ML) 0.083% IN NEBU
2.5000 mg | INHALATION_SOLUTION | RESPIRATORY_TRACT | Status: DC | PRN
Start: 2021-01-22 — End: 2021-02-02
  Filled 2021-01-22: qty 3

## 2021-01-22 NOTE — Plan of Care (Signed)
Problem: Moderate/High Fall Risk Score >5  Goal: Patient will remain free of falls  Outcome: Progressing     Problem: Safety  Goal: Patient will be free from injury during hospitalization  Outcome: Progressing  Flowsheets (Taken 01/22/2021 0509)  Patient will be free from injury during hospitalization:   Hourly rounding   Ensure appropriate safety devices are available at the bedside   Assess patient's risk for falls and implement fall prevention plan of care per policy   Provide and maintain safe environment   Use appropriate transfer methods     Problem: Pain  Goal: Pain at adequate level as identified by patient  Outcome: Progressing  Flowsheets (Taken 01/22/2021 0509)  Pain at adequate level as identified by patient:   Identify patient comfort function goal   Assess for risk of opioid induced respiratory depression, including snoring/sleep apnea. Alert healthcare team of risk factors identified.   Reassess pain within 30-60 minutes of any procedure/intervention, per Pain Assessment, Intervention, Reassessment (AIR) Cycle   Offer non-pharmacological pain management interventions     Problem: Psychosocial and Spiritual Needs  Goal: Demonstrates ability to cope with hospitalization/illness  Outcome: Progressing  Flowsheets (Taken 01/22/2021 0509)  Demonstrates ability to cope with hospitalizations/illness:   Encourage verbalization of feelings/concerns/expectations   Provide quiet environment   Encourage patient to set small goals for self   Encourage participation in diversional activity   Reinforce positive adaptation of new coping behaviors

## 2021-01-22 NOTE — UM Notes (Signed)
9/22      80 YO W/ H/O DM, GERD, HLD, HTN PRESENTED TO ER W/ C/O COUGH, WORSENING SOB X 1 WK    BP: 186/77, Heart Rate: 64, Temp: 98.5 F (36.9 C), Resp Rate: 18, SpO2: 100 %       EXAM  Pulmonary:      Effort: Pulmonary effort is normal. No respiratory distress.      Breath sounds: Examination of the right-upper field reveals wheezing. Examination of the left-upper field reveals wheezing. Examination of the right-middle field reveals wheezing. Examination of the left-middle field reveals wheezing. Examination of the right-lower field reveals wheezing. Examination of the left-lower field reveals wheezing. Wheezing present.       LAB- WNL    CTA CHEST (-)      Medication Administration from 01/21/2021 1407 to 01/21/2021 2225     Date/Time Order Dose Route Action Action by Comments    01/21/2021 1428 EDT albuterol (PROVENTIL) (2.5 MG/3ML) 0.083% nebulizer solution 2.5 mg 2.5 mg Nebulization Given Warner Mccreedy, RN --    01/21/2021 1428 EDT ipratropium (ATROVENT) 0.02 % nebulizer solution 0.5 mg 0.5 mg Nebulization Given Warner Mccreedy, RN --    01/21/2021 1502 EDT dextrose (D10W) 10% bolus 250 mL 0 mL Intravenous Hold Warner Mccreedy, RN Repeat blood sugar after drinking juice is 130. Per MD hold dextrose infusion    01/21/2021 1617 EDT morphine injection 2 mg 2 mg Intravenous Given Warner Mccreedy, RN --    01/21/2021 1652 EDT iohexol (OMNIPAQUE) 350 MG/ML injection 80 mL 80 mL Intravenous Imaging Agent Given Thresa Ross --    01/21/2021 2120 EDT aspirin EC tablet 81 mg 81 mg Oral Given Milly Jakob, RN --    01/21/2021 1828 EDT furosemide (LASIX) tablet 40 mg -- Oral Canceled Entry Saddie Benders, MD Automatically canceled at discontinue of medication order    01/21/2021 1828 EDT calcium carbonate (TUMS) chewable tablet 500 mg -- Oral Canceled Entry Alfonse Ras, [facility]          No respiratory distress in ED, and does not feel safe going home.  Will admit for OBS given age, risk factors and  continued symptoms although favor likely related to recent URI and bronchospasm          Admit to Observation (Order [id number])  Admission  Date: 01/21/2021 Department: [facility] Ordering/Authorizing: Windell Hummingbird, DO          Active Hospital Problems     Diagnosis    Shortness of breath   Assessment:  -Shortness of breath and cough: Reason for presentation.  Likely secondary to bronchitis.  -Possible exacerbation of reactive airway disease.  Patient has no diagnosis of asthma, quit smoking about 30 years back.  Has extensive bilateral expiratory wheezing.  -Acute renal failure with the potential for worsening secondary to contrast exposure.  -Hyperkalemia.  -Degenerative lumbar spinal disease.  -Indeterminate sclerotic lesion within the L5 vertebral body.  -Asymptomatic moderate carotid stenosis.  -CAD s/p CABG and PCI.  -Hypertension.  -Diabetes mellitus with an episode of hypoglycemia at the time of presentation.  Plan:   -Azithromycin for possible bronchitis.  -Oxygen supplementation as needed.  -Adequate pain control with Tylenol and lidocaine patch for the back.  -Nebs and IV steroids  -Gentle IV hydration and monitor renal function.  -Kayexalate x1 and monitor serum potassium.  -Outpatient vascular surgery follow-up for further evaluation of L5 vertebral body lesion.  -Continued outpatient follow-up with vascular surgery for moderate carotid stenosis.  -  Hold off on Lasix/losartan for now secondary to acute renal failure.  Hydralazine as needed to maintain < 160 mm  of HG.  -Maintain euglycemia with sliding scale insulin.  -Continue rest of the medications with her chronic medical comorbidities as before.     Nutrition:Diet Consistent Carbohydrate and Heart Healthy     Code status: Full code      Status/Disposition:   Pt is admitted under observation with above concerns.    Anticipated medical stability for discharge: 24 Hrs

## 2021-01-22 NOTE — PT Eval Note (Signed)
[facility]  [address]  [place], [place] [address]  [phone]    Physical Therapy Evaluation    Patient: Vanessa Kelly MRN: [id number]   Unit: [facility] INTERMEDIATE CARE Bed: MI626/MI626-01    Time of Treatment: Time Calculation  PT Received On: 01/22/21  Start Time: 1205  Stop Time: 1240  Time Calculation (min): 35 min    Consult received for Vanessa Kelly for PT evaluation and treatment.  Patient's medical condition is appropriate for Physical Therapy  intervention at this time.    Interpreter utilized: no, not indicated      D/C Suggestions   Based on today's performance:  Discharge Recommendation: ALF;Home with home health PT   DME Recommended for Discharge: No additional equipment/DME recommended at this time     If recommended d/c disposition is not available, patient will need SNF.  Transport Recommendations: wheelchair van/transport    Discharge recommendations are based on patient's progression/regression. Please see most recent note for updated discharge recommendations.    Assessment   Vanessa Kelly is a 80 y.o. female admitted 01/21/2021.  Patient presents with pain in low back, generalized deconditioning and decreased endurance/activity tolerance, decreased strength, decreased balance, impaired functional mobility, and impaired amb (requires use of RW), which is a decline from baseline. Pt lives in an ALF apt and was Independent with amb with RW in the apt and with rollator in facility/community prior. Pt was Indep in ADL's, except showers. Pt currently requires Supv for bed mobility and CGA for transfers and ambulation 1ft with RW. Pt presents with above PT problem list and would benefit from continued skilled PT services. Depending on progress, recommend home with HHPT vs. SNF upon discharge.     Pt reports h/o orthostatic BP and checks at least 2 times per day at home. Pt states she was receiving OP PT prior to hospitalization.    Pt's functional mobility is impacted by:   decreased activity tolerance, decreased balance, decreased bed mobility, dizziness/vertigo, gait impairment, orthostatic blood pressure, pain, decreased strength, and transfers .  There are a few comorbidities or other factors that affect plan of care and require modification of task including: assistive device needed for mobility, frequent falls, orthostasis, vertigo/dizziness, and home alone for a portion of the day.  Standardized tests and exams incorporated into evaluation include balance, ROM , and Strength.  Pt demonstrates a evolving clinical presentation due to abnormal vital sign response to activity.   Pt would continue to benefit from PT to address these deficits and increase functional independence.     Complexity Level Hx and Co  morbidites Examination Clinical Decision Making Clinical Presentation   Moderate   1-2 factors 3 or more   Several options Evolving, plan may alter       PMP - Progressive Mobility Protocol   PMP Activity: Step 6 - Walks in Room  Distance Walked (ft) (Step 6,7): 22 Feet       Rehabilitation Potential: Good  Prognosis: Good    Interdisciplinary Communication: RN, OT      Plan     Plan  Risks/Benefits/POC Discussed with Pt/Family: With patient  Patient Goal: To return to ALF and continue PT  Treatment/Interventions: Exercise;Gait training;Neuromuscular re-education;Functional transfer training;LE strengthening/ROM;Endurance training;Patient/family training;Bed mobility  PT Frequency: 3-4x/wk         Medical Diagnosis: Shortness of breath [R06.02]  Hiatal hernia [K44.9]  Hyponatremia [E87.1]  Hypoglycemia [E16.2]  Lesion of vertebra [M89.9]  Anemia, unspecified type [D64.9]  Acute  bilateral low back pain without sciatica [M54.50]    History of Present Illness: Vanessa Kelly is a 80 y.o. female admitted on  01/21/2021 with "a PMHx of HTN, DM,CAD s/p CABG in 2002 and 2 stent in 2006,TIA,chronic back pain ,OA S/P right knee replacement, recent diagnosis of parkinsonism, and  moderately asymptomatic carotid stenosis presented to the ED secondary to worsening shortness of breath and cough.  Per patient, cough started about a week back, has resolved after 3 to 4 days with Mucinex and started getting worse and was productive of mucopurulent sputum which prompted her to come to emergency room.   -Shortness of breath and cough: Reason for presentation.  Likely secondary to bronchitis.  -Possible exacerbation of reactive airway disease.  Patient has no diagnosis of asthma, quit smoking about 30 years back.  Has extensive bilateral expiratory wheezing.  -Acute renal failure with the potential for worsening secondary to contrast exposure.  -Hyperkalemia.  -Degenerative lumbar spinal disease.  -Indeterminate sclerotic lesion within the L5 vertebral body.  -Asymptomatic moderate carotid stenosis.  -CAD s/p CABG and PCI.  -Hypertension.  -Diabetes mellitus with an episode of hypoglycemia at the time of presentation."      Patient Active Problem List   Diagnosis    Shortness of breath       Past Medical History:   Diagnosis Date    Diabetes mellitus     Gastroesophageal reflux disease     Hyperlipidemia     Hypertension     Seasonal allergic rhinitis      History reviewed. No pertinent surgical history.    Tests/Labs:  Lab Results   Component Value Date/Time    HGB 10.0 (L) 01/22/2021 05:32 AM    HCT 30.5 (L) 01/22/2021 05:32 AM    K 5.8 (H) 01/22/2021 05:32 AM    NA 132 (L) 01/22/2021 05:32 AM    TROPI <0.01 01/21/2021 02:30 PM         Imaging   CT Reconstruction T-spine    Result Date: 01/21/2021  1. No evidence of lumbar spine fracture. 2. Multilevel degenerative changes throughout the thoracic and lumbar spine most severely affecting the mid and lower lumbar spine. 3. Indeterminate sclerotic lesion within the L5 vertebral body. Please correlate with possible history of possible history of primary neoplasm. Whole-body bone scan date performed to further evaluate this lesion. Fonnie Mu, DO   01/21/2021 5:11 PM    CT Angio Chest (PE study)    Result Date: 01/21/2021  1. No evidence for pulmonary embolism. 2. Large hiatal hernia with partial intrathoracic location of the stomach. Wyatt Portela, MD  01/21/2021 5:01 PM    CT L- Spine without Contrast    Result Date: 01/21/2021  1. No evidence of lumbar spine fracture. 2. Multilevel degenerative changes throughout the thoracic and lumbar spine most severely affecting the mid and lower lumbar spine. 3. Indeterminate sclerotic lesion within the L5 vertebral body. Please correlate with possible history of possible history of primary neoplasm. Whole-body bone scan date performed to further evaluate this lesion. Fonnie Mu, DO  01/21/2021 5:11 PM     Social History: (Per family/care provider, if patient is not able to give accurate report)  Lives alone in a 3rd floor apt in ALF.  Entry Steps: 0   Rails: NA Inside steps: 0  Rails: NA  Equipment at home:  single point cane, rolling walker, rollator, walk in shower, grab bars in shower, shower chair, hand held shower head, blood pressure monitor,  life alert, and adjustable bed  Prior Level of Function:     Cognition: Indep    Mobility/Locomotion: Modified Indep   Feeding: Indep   Grooming: Indep   Bathing: Supv/Assist to shower   Dressing: Indep   Toileting: Modified Indep    Subjective   Patient is agreeable to participation in the therapy session. Nursing clears patient for therapy.  Patient's Goal:  To return to ALF and continue PT.  Pain: occasional spasms with coughing  Location: low back  Therapist Intervention: patient declined intervention  Patient is satisfied with therapist intervention.    Objective     Precautions/ Contraindications:   Precautions  Weight Bearing Status: no restrictions  Other Precautions: Falls, monitor BP for orthostatic hypotension    Patient is in bed with  Telemetry, PrimaFit (external female catheter), Intravenous Access x2, and Bed Alarm in place.       Observation of patient/vitals:     Orthostatic vitals taken and are as following:  Supine: BP 148/57, HR 63, SpO2 98%  Sitting: BP 106/56, HR 75, SpO2 96%  Standing: BP 118/66, HR 82, SpO2 96%  Post-amb:  BP 150/54, HR 78, SpO2 98%  Pt asymptomatic throughout visit.    Vitals:    01/22/21 1234 01/22/21 1455 01/22/21 1458 01/22/21 1501   BP: 150/54 156/64 122/67 113/66   Pulse: 78 80 82 85   Resp:       Temp:       TempSrc:       SpO2: 98%      Weight:       Height:           Orientation/Cognition:  Alert and Oriented x 4  Cognition: Intact, appropriate and able to follow all commands      Musculoskeletal Examination:      ROM Strength   RLE [facility] WFL   LLE [facility] WFL       Functional Mobility:  Rolling: Supv    Supine to sit: Supv  Scooting: Supv  Sit to Supine: Supv  Sit to stand: CGA  Stand to sit: CGA  Transfers: CGA  Ambulation:     Weightbearing: Full   Assistance level: CGA   Distance: 71ft   Assistive Device: RW    Balance:  Static Sit: Good  Dynamic Sit: Good  Static Stand: Fair  Dynamic Stand: Fair    Endurance: Fair    Participation:  Good    Education:  Educated the patient to role of physical therapy, plan of care, goals  of therapy, rationale for progressing mobility and safety with mobility and ADLs and home safety.    RN notified of session outcome and that patient was left in bed with all needs met and equipment intact.   Safety measures include: handoff to nurse/clin tech/ unit secretary completed, bed alarm activated, oriented to call bell and placed within reach, personal items within reach, assistive device positioned out of reach, and bed placed in lowest position.   Mobility and ADL status posted at bedside and within E.M.R.               Goals  Goal Formulation: With patient  Time for Goal Acheivement: 5 visits  Goals: Select goal  Pt Will Go Supine To Sit: modified independent  Pt Will Perform Sit To Supine: modified independent  Pt Will Transfer Bed/Chair: with rolling walker;modified independent  Pt Will Ambulate: > 200  feet;with four wheel walker;with rolling walker;modified independent  Therapist PPE during session procedural mask and gloves     Signature:  Geronimo Running, PT  01/22/2021  3:52 PM  Phone: [phone]     (For scheduling questions, please contact rehab tech [phone])      Attention MD:   Thank you for allowing [place] to participate in the care of Vanessa Kelly. Regulations from the [employer] for Medicare and Medicaid Services (CMS) require your review and approval of this plan of care.     Please cosign this note indicating you are in agreement with the Therapy Plan of Care so we may initiate the therapy treatment plan.

## 2021-01-22 NOTE — Consults (Signed)
CARDIOLOGY INPATIENT CONSULTATION   PROBLEM LIST    Today's Date:   01/22/2021    Admission Date:   01/21/2021    Patient's Name: Vanessa Kelly  DOB:    [birth year].    Attending:    Illene Regulus, MD     ASSESSMENT AND PLAN   Vanessa Kelly is a 80 y.o. female pt with CAD HTN, former smoking, TIA, LE venous reflux disease and asymptomatic carotid artery disease followed by vascular surgery, Parkinson's disease, chronic back pain who presented with a clinical diagnosis of bronchitis    Acute bronchitis-primary diagnosis.  COVID-19 negative.  No evidence of CHF.  Currently on albuterol, Solu-Medrol IV and azithromycin.  Patient reports improvement in her shortness of breath and cough.  No wheezing appreciated on exam.  Per primary    CAD -  s/p CABG in 2002 and 2 stents in 2006, asymptomatic.  Continue home regimen of aspirin, atorvastatin, ranolazine.  May avoid BB given baseline low HR.    HTN-initial BP of 236/98 on presentation, now improved to 134/70  Holding candesartan given AKI and hyperkalemia.  Patient has history of chronic LE swelling in the setting of LE venous reflux disease and can sometimes blockers can potentially worsen this.  We will give her a trial of hydralazine 25 mg p.o. twice daily.    Asymptomatic coronary artery disease-continue aspirin and statin    Case discussed with hospitalist attending.  ______________________________    Alinda Money, MD, MPH, [facility]  Cardiology - [facility]    [facility] - ext. [phone]    [facility]  - ext. [phone]  [facility] - ext. [phone]  Office Tel.:  [phone], Fax: [phone]  ______________________________    HPI     Vanessa Kelly is a 80 y.o. female pt with CAD s/p CABG in 2002 and 2 stents in 2006, HTN, former smoking, TIA, asymptomatic carotid artery disease followed by vascular surgery, Parkinson's disease, chronic back pain who presented with shortness of breath and productive cough of 1  week duration and admitted with a clinical diagnosis of bronchitis, now on azithromycin and IV steroids.  Her COVID-19 test was negative.  Negative troponin and BNP of 71.  Of note, she was also noted to be in hypertensive urgency at the time of presentation with blood pressure as high as 236/98, which is now improved and did not require IV antihypertensives.  Her creatinine was mildly elevated at the time of presentation at 1.4, now trending down and she was noted to be hyperkalemic with potassium ranging from 5.4-5.8, now being given Kayexalate.  Her D-dimer is mildly elevated but her CT angiogram of the chest showed no evidence of PE.    She denies any chest discomfort.  Reports significant improvement in her cough and shortness of breath since admission.  No fevers, chills or rigors.  No orthopnea or PND.  She has chronic lower extremity swelling the setting of LE venous insufficiency for which she wears compression socks.    REVIEW OF SYSTEMS     All other systems were reviewed and were negative except as noted in HPI.     CARDIAC DIAGNOSTICS     ECG:  SR, possible inferior infarct pattern    Telemetry (indepedently reviewed by me): Sinus rhythm, no significant events    Echocardiogram 12/01/2020     * Left ventricular ejection fraction is normal with an estimated ejection  fraction of 65-70%.    *  There is mild concentric left ventricular hypertrophy.    * Left ventricular diastolic filling parameters demonstrate normal diastolic  function.    * Normal right ventricular systolic function.    * There is trace aortic regurgitation.    * No pulmonary hypertension with estimated right ventricular systolic  pressure of  23 mmHg.    * The IVC is normal in size with > 50% respiratory variance consistent with  normal RA pressure of 3 mmHg.     PET myocardial perfusion imaging 01/27/2020 - Equivocal. Small sized area of ischemia in the apical wall. EF 85%, no RWMA     Carotid artery Dopplers 05/11/2020  Findings:  The  right and left side common carotid arteries, bifurcations, carotid bulbs as well as the internal and external carotid arteries show no evidence of obstruction or significant stenosis. Peak systolic flow velocities are within the normal range with normal-appearing Doppler waveforms.     Right common artery: Peak systolic velocity 184 cm/s     Right internal carotid artery: Peak systolic velocity 142 cm/s     Left common carotid artery: Peak systolic velocity 144 cm/s     Left internal carotid artery: Peak systolic velocity 165 cm/s     The right ICA/CCA ratio is 0.9     The left ICA/CCA ratio is 1.3   Atherosclerotic plaque is seen of the carotid bulbs bilaterally.   Vertebral artery flow is antegrade bilaterally       IMPRESSION:   1. Atherosclerotic disease, bilateral 50-69% stenosis internal carotid arteries.    Rad. Imaging:  CT Reconstruction T-spine    Result Date: 01/21/2021  1. No evidence of lumbar spine fracture. 2. Multilevel degenerative changes throughout the thoracic and lumbar spine most severely affecting the mid and lower lumbar spine. 3. Indeterminate sclerotic lesion within the L5 vertebral body. Please correlate with possible history of possible history of primary neoplasm. Whole-body bone scan date performed to further evaluate this lesion. Fonnie Mu, DO  01/21/2021 5:11 PM    CT Angio Chest (PE study)    Result Date: 01/21/2021  1. No evidence for pulmonary embolism. 2. Large hiatal hernia with partial intrathoracic location of the stomach. Wyatt Portela, MD  01/21/2021 5:01 PM    CT L- Spine without Contrast    Result Date: 01/21/2021  1. No evidence of lumbar spine fracture. 2. Multilevel degenerative changes throughout the thoracic and lumbar spine most severely affecting the mid and lower lumbar spine. 3. Indeterminate sclerotic lesion within the L5 vertebral body. Please correlate with possible history of possible history of primary neoplasm. Whole-body bone scan date performed to further  evaluate this lesion. Fonnie Mu, DO  01/21/2021 5:11 PM       MEDICATIONS/ALLERGY     Current Facility-Administered Medications   Medication Dose Route Frequency    albuterol  2.5 mg Nebulization Q6H [facility]    aspirin EC  81 mg Oral Daily    atorvastatin  10 mg Oral Daily    azithromycin  500 mg Oral Daily    dextrose  250 mL Intravenous Once    enoxaparin  30 mg Subcutaneous Daily    fluticasone  1 spray Each Nare Daily    insulin lispro  1-4 Units Subcutaneous QHS    insulin lispro  1-8 Units Subcutaneous TID AC    lidocaine  1 patch Transdermal Q24H    methylPREDNISolone  40 mg Intravenous BID    pregabalin  25 mg Oral BID  ranolazine  500 mg Oral BID    sodium polystyrene  15 g Oral Once    venlafaxine  75 mg Oral QHS     acetaminophen, calcium carbonate, Nursing communication: Adult Hypoglycemia Treatment Algorithm **AND** glucagon (rDNA) **AND** dextrose **AND** dextrose **AND** dextrose, hydrALAZINE, magnesium sulfate, melatonin, naloxone, potassium & sodium phosphates, potassium chloride **AND** potassium chloride, traMADol   Allergies   Allergen Reactions    Fentanyl Anaphylaxis     Hives    Percolone [Oxycodone]         PMH/PSH     Past Medical History:   Diagnosis Date    Diabetes mellitus     Gastroesophageal reflux disease     Hyperlipidemia     Hypertension     Seasonal allergic rhinitis      History reviewed. No pertinent surgical history.    SOCIAL HISTORY     Social History     Tobacco Use    Smoking status: Former     Types: Cigarettes     Quit date: 1990     Years since quitting: 32.7    Smokeless tobacco: Never   Substance Use Topics    Alcohol use: Not Currently       FAMILY  HISTORY   family history includes Heart failure in her mother; Myocardial Infarction in her father.      PHYSICAL EXAM   BP 134/70    Pulse (!) 56    Temp 97.5 F (36.4 C) (Oral)    Resp 18    Ht 1.753 m (5' 9.02")    SpO2 100%    BMI 26.69 kg/m    Intake/Output Summary (Last 24 hours) at 01/22/2021 0857  Last data  filed at 01/22/2021 0428  Gross per 24 hour   Intake --   Output 450 ml   Net -450 ml     General: no distress   Head: NC/AT  Eyes: no conjunctival pallor, no icterus   ENMT: MMM, no oral ulcers  Neck: No thyromegaly or lymphadenopathy  Respiratory: clear to auscultation, no rales or rhonchi or wheezes  Cardiovascular: no JVD, no carotid bruits. normal rate, regular rhythm, normal S1 and S2; no heaves or lifts; no murmurs, no gallops or rubs   GI: soft, non-tender  Ext: warm, no edema, no cyanosis   Skin: warm and dry, no rashes.   Neuro/Psych: alert and oriented    LAB RESULTS     Basic Metabolic Profile   Recent Labs   Lab 01/22/21  0532 01/21/21  1430   Sodium 132* 133*   Potassium 5.8* 5.4*   Chloride 102 102   CO2 24 25   BUN 30.0* 32.0*   Creatinine 1.3* 1.4*   EGFR 47.4 43.5   Glucose 193* 89   Calcium 9.8 9.7        CBC w/Diff   Recent Labs   Lab 01/22/21  0532 01/21/21  1430   WBC 4.74 5.91   Hgb 10.0* 10.0*   Hematocrit 30.5* 30.4*   Platelets 176 191        Cardiac Biomarkers   Recent Labs   Lab 01/21/21  1430   Troponin I <0.01     Recent Labs   Lab 01/21/21  1430   B-Natriuretic Peptide 71        Cholesterol Panel   No results found for: CHOL, HDL, LDL, TRIG      Endocrine   No results found for: HGBA1C, TSH, FREET3  Coagulation Studies   Recent Labs   Lab 01/21/21  1430   D-Dimer 0.83*          This note was generated by the Epic EMR system/Dragon speech recognition and may contain inherent errors or omissions not intended by the user. Grammatical errors, random word insertions, deletions and pronoun errors  are occasional consequences of this technology due to software limitations. Not all errors are caught or corrected. If there are questions or concerns about the content of this note or information contained within the body of this dictation they should be addressed directly with the author for clarification.

## 2021-01-22 NOTE — Progress Notes (Addendum)
MEDICINE PROGRESS NOTE  [facility], DIVISION OF HOSPITALIST MEDICINE   [facility]   [place] Pager: [phone]      Date Time: 01/22/21 9:30 AM  Patient Name: Vanessa Kelly  Attending Physician: Illene Regulus, MD  Today's date: 01/22/2021  Hospital Day: 2    CC: Shortness of breath    Vanessa Kelly is 80 y.o. F with hx of Diabetes mellitus, GERD,Hyperlipidemia, Hypertension, TIA,carotid artery stenosis, CAD s/p CABG, stents, falls, chronic back pain ,OA S/P right TKR, parkinsonism (states she doesn't have it), LE venous insufficiency,orthostatic hypotension who presents with worsening of cough,sob and wheezing x 1 week.Patient had fallen one week PTA when she lost her balance trying to plug something in.Has no sick contact. No chest pain, + chronic LE edema.She started taking tums for heart burn couple of weeks ago. No abdominal pain/N/V/D. N ofever or chest pain.She was found to have AKI,hyponatremia and hyperkalemia    S: Patient seen and examined.Feels slightly better. C/o cough, sob and wheezing. Having low back pain  No chest pain, afebrile  No dizziness or focal weakness      Assessment/Plan     # Asthmatic bronchitis. Etiology not entirely clear. The patient has no prior history of underlying lung disease.    - flu and covid screen negative  - no evidence of pneumonia on chest x-ray.   - CTA  chest large hiatal hernia with partial intrathoracic location of the  Stomach, no acute abnormality or PE  - ddx include a viral infection with resultant hyperactive airway. Need also to consider with the possibility of gastroesophageal reflux disease (given large hiatal hernia) and resultant bronchospasm. NO eosinophilia   - elevate head of the bed  -Continue nebulization treatments with DuoNeb q.6 h and prn, IV solumedrol  -Start pulmicort bid  - add PPI   - cont azithromycin  - check RVP , PCT    # Large Hiatal hernia  # GERD  - cont PPI    # Uncontrolled HTN  # Hx of orthostatic  hypotension  # AKI   # Hyponatremia 133->132  # Hyperkalemia 5.4->5.8    - check orthostatic VS  - cont current hydralazine, holding parameters in place  - Cosyntropin stim test in am,   - kayexalate 30 gx 1  - recheck K this afternoon   - hold ARB    # Bilateral Carotid artery disease   # Prior CVA  -  50-69% stenosis internal carotid arteries.  # CAD s/p CABG 2002  - s/p stents in 2006 and 2019   - Echo normal EF  - ECG normal sinus rhythm,bradycardia,inferior lead q wave  - Mild apical ischemia on PET myocardial fusion scan 01/2020  - not on BB ,takes Candesartan 4 mg if SBP > 160 due to recurrent syncope and OH  - continue Aspirin, statin     #Recurrent fall  - pt was diagnosed with parkinson's but states "I don't have it" and not on meds at this time  - r/o Orthostatic Hypotension  - Fall precaution  -  encourage oral hydration  - PT/OT      #Anemia  - check B12, folate and iron studies  - TSH  - follow H/H    # ? Parkinsonism   - not on meds  #Type II DM  - Takes Trulicity once daily injection 0.75 mg at home  - On low-dose sliding scale  - HbA1c 5.3     #  Depression- resume effexor    # Low back pain  - CT shows -  Indeterminate sclerotic lesion within the L5 vertebral body, multilevel degenerative changes  - bone scan on Monday       Review of Systems:     Gen: No fever or fatigue  Cardiovascular: No chest pain and leg swelling.   Gastrointestinal: no abdominal pain/N/V/D    Genitourinary: No urinary complaint  Neuro: no change in sensation, no bowel or bladder incontinence  All other systems reviewed and are negative.      Physical Exam:       Temp:  [97.5 F (36.4 C)-98.5 F (36.9 C)] 97.5 F (36.4 C)  Heart Rate:  [56-73] 56  Resp Rate:  [13-19] 18  BP: (134-236)/(64-98) 134/70  Intake and Output Summary-last 24 Hrs:  I/O last 3 completed shifts:  In: -   Out: 450 [Urine:450]  No data recorded by O2 Device: None (Room air)    Vitals and nursing note reviewed. Exam conducted with a chaperone present.    Constitutional: Normal appearance, no distress   Head: Normocephalic and atraumatic.   Mouth: Mucous membranes are moist. + pharyngeal erythema  Eyes: Normal conjunctivae, NIS  Cardiovascular: S1S2 RRR   Pulmonary: b/l diffuse expiratory wheezing, no distress  Abdomen: S/NT/ND/+BS  MSK: chronic b/l LE edema, low back point tenderness  Neurological: Aox3, no gross abnormality,mild tremors noted  Psych: normal behavior, normal mood       Meds:   Medications were reviewed:  Scheduled Meds:  Current Facility-Administered Medications   Medication Dose Route Frequency    albuterol  2.5 mg Nebulization Q6H SCH    aspirin EC  81 mg Oral Daily    atorvastatin  10 mg Oral Daily    azithromycin  500 mg Oral Daily    dextrose  250 mL Intravenous Once    enoxaparin  30 mg Subcutaneous Daily    fluticasone  1 spray Each Nare Daily    insulin lispro  1-4 Units Subcutaneous QHS    insulin lispro  1-8 Units Subcutaneous TID AC    lidocaine  1 patch Transdermal Q24H    methylPREDNISolone  40 mg Intravenous BID    pregabalin  25 mg Oral BID    ranolazine  500 mg Oral BID    sodium polystyrene  15 g Oral Once    venlafaxine  75 mg Oral QHS     Continuous Infusions:  PRN Meds:.acetaminophen, calcium carbonate, Nursing communication: Adult Hypoglycemia Treatment Algorithm **AND** glucagon (rDNA) **AND** dextrose **AND** dextrose **AND** dextrose, hydrALAZINE, magnesium sulfate, melatonin, naloxone, potassium & sodium phosphates, potassium chloride **AND** potassium chloride, traMADol  DVT prophylaxis Chemical        Lines:     Patient Lines/Drains/Airways Status       Active PICC Line / CVC Line / PIV Line / Drain / Airway / Intraosseous Line / Epidural Line / ART Line / Line / Wound / Pressure Ulcer / NG/OG Tube       Name Placement date Placement time Site Days    Peripheral IV 01/21/21 20 G Standard Right Antecubital 01/21/21  1422  Antecubital  less than 1    Peripheral IV 01/21/21 20 G Left Upper Arm 01/21/21  1558  Upper Arm   less than 1    External Urinary Catheter 01/22/21  0030  --  less than 1  Labs/Radiology:   Imaging personally reviewed, including: all available   CT Reconstruction T-spine    Result Date: 01/21/2021  1. No evidence of lumbar spine fracture. 2. Multilevel degenerative changes throughout the thoracic and lumbar spine most severely affecting the mid and lower lumbar spine. 3. Indeterminate sclerotic lesion within the L5 vertebral body. Please correlate with possible history of possible history of primary neoplasm. Whole-body bone scan date performed to further evaluate this lesion. Fonnie Mu, DO  01/21/2021 5:11 PM    CT Angio Chest (PE study)    Result Date: 01/21/2021  1. No evidence for pulmonary embolism. 2. Large hiatal hernia with partial intrathoracic location of the stomach. Wyatt Portela, MD  01/21/2021 5:01 PM    CT L- Spine without Contrast    Result Date: 01/21/2021  1. No evidence of lumbar spine fracture. 2. Multilevel degenerative changes throughout the thoracic and lumbar spine most severely affecting the mid and lower lumbar spine. 3. Indeterminate sclerotic lesion within the L5 vertebral body. Please correlate with possible history of possible history of primary neoplasm. Whole-body bone scan date performed to further evaluate this lesion. Fonnie Mu, DO  01/21/2021 5:11 PM   Recent Labs     01/22/21  0536 01/21/21  2328 01/21/21  2105 01/21/21  1907 01/21/21  1456 01/21/21  1413   Whole Blood Glucose POCT 173* 115* 149* 73 130* 51*     Recent Labs   Lab 01/22/21  0532 01/21/21  1430   Sodium 132* 133*   Potassium 5.8* 5.4*   Chloride 102 102   BUN 30.0* 32.0*   Creatinine 1.3* 1.4*   EGFR 47.4 43.5   Glucose 193* 89   Calcium 9.8 9.7     Recent Labs   Lab 01/22/21  0532 01/21/21  1430   WBC 4.74 5.91   Hgb 10.0* 10.0*   Hematocrit 30.5* 30.4*   Platelets 176 191         Recent Labs   Lab 01/22/21  0532 01/21/21  1430   Alkaline Phosphatase 83 84   Bilirubin, Total 0.3 0.3    ALT 27 27   AST (SGOT) 21 22     Recent Labs   Lab 01/21/21  1430   D-Dimer 0.83*       Echocardiogram 12/01/2020    Summary    * Left ventricular ejection fraction is normal with an estimated ejection  fraction of 65-70%.    * There is mild concentric left ventricular hypertrophy.    * Left ventricular diastolic filling parameters demonstrate normal diastolic  function.    * Normal right ventricular systolic function.    * There is trace aortic regurgitation.    * No pulmonary hypertension with estimated right ventricular systolic  pressure of  23 mmHg.    * The IVC is normal in size with > 50% respiratory variance consistent with  normal RA pressure of 3 mmHg.     PET myocardial perfusion imaging 01/27/2020 - Equivocal. Small sized area of ischemia in the apical wall. EF 85%, no RWMA     Carotid artery Dopplers 05/11/2020  IMPRESSION:   1. Atherosclerotic disease, bilateral 50-69% stenosis internal carotid arteries.       Signed by: Illene Regulus, MD

## 2021-01-22 NOTE — Progress Notes (Signed)
Chaplain Service      Background:  Visit Type: Initial was made by Chaplain with patient, Vanessa Kelly, based on Source: Chaplain Initiated.  Present at Visit: Patient.  Spiritual Care Provided to: patient only.    .    Summary:  Spiritual Care Interventions: Provided prayer, Provided spiritual encouragement  Reason for Request: Spiritual Support, Spiritual Assessment   Spiritual Care Outcomes: Patient expressed appreciation of visit

## 2021-01-22 NOTE — OT Eval Note (Signed)
[facility]  [address]  [place], [place] [address]  [phone]    Occupational Therapy Evaluation    Patient: Vanessa Kelly MRN: [id number]   Unit: [facility] INTERMEDIATE CARE Bed: MI626/MI626-01    Time of treatment: Time Calculation  OT Received On: 01/22/21  Start Time: 1445  Stop Time: 1527  Time Calculation (min): 42 min    Consult received for Vanessa Kelly for OT evaluation and treatment.  Patient's medical condition is appropriate for Occupational Therapy  intervention at this time.    Interpreter utilized: no, not indicated    Assessment     Vanessa Kelly is a 80 y.o. female admitted 01/21/2021 from [facility] with SOB. Pt with hx HTN, DMII, CAD s/p CABG (2002) and stent (2006), TIA, chronic back pain, OA s/p R TKA, parkinsonism, moderately asymptomatic carotid stenosis. At baseline pt lives at [facility] and PLOF mod I/assist for ADLs, assist for IADLs, amb with rollator. Pt received supine in bed and agreeable to working with OT. Pt orthostatic(+), asymptomatic. Pt transferred supine<>sit with log roll technique with supv, sit<>stand with SBA RW, amb 10' x4 SBA RW, completed LBD briefs mgmt with mod I, toileting mod I, hand hygiene supv standing sinkside. Pt returned to supine in bed and left with needs in place. D/c recommendation to home with supv.     Brief chart review completed including review of labs review of imaging review of vitals.  Pt's ability to complete ADLs and functional transfers is at/near fxn'l baseline. Pt does not have any acute OT needs at this time. D/C OT.         Complexity Chart Review Performance Deficits Clinical Decision Making Hx/Comorbidities Assistance needed   Low  Brief  1-3 Limited options None None (or at baseline)       PMP - Progressive Mobility Protocol   PMP Activity: Step 6 - Walks in Room  Distance Walked (ft) (Step 6,7): 10 Feet x4 SBA RW    Interdisciplinary Communication: discussed with PT/RN    Plan   Based on today's  performance:  Discharge Recommendation: Home with supervision (return to ALF)  DME Recommended for Discharge: No additional equipment/DME recommended at this time;Patient already has needed equipment   Transport Recommendations: stretcher transport    Discharge recommendations are based on patient's progression/regression. Please see most recent note for updated discharge recommendations.    OT Plan  Risks/Benefits/POC Discussed with Pt/Family: With patient  Treatment Interventions: No skilled interventions needed at this time  Discharge Recommendation: Home with supervision  DME Recommended for Discharge: No additional equipment/DME recommended at this time;Patient already has needed equipment  OT Frequency Recommended: one time visit - therapy discontinued         Medical Diagnosis: Shortness of breath [R06.02]  Hiatal hernia [K44.9]  Hyponatremia [E87.1]  Hypoglycemia [E16.2]  Lesion of vertebra [M89.9]  Anemia, unspecified type [D64.9]  Acute bilateral low back pain without sciatica [M54.50]    History of Present Illness: Vanessa Kelly is a 80 y.o. female admitted on  01/21/2021 with "PMHx of HTN, DM,CAD s/p CABG in 2002 and 2 stent in 2006,TIA,chronic back pain ,OA S/P right knee replacement, recent diagnosis of parkinsonism, and moderately asymptomatic carotid stenosis presented to the ED secondary to worsening shortness of breath and cough.  Per patient, cough started about a week back, has resolved after 3 to 4 days with Mucinex and started getting worse and was productive of mucopurulent sputum which prompted  her to come to emergency room. "        Patient Active Problem List   Diagnosis    Shortness of breath       Past Medical History:   Diagnosis Date    Diabetes mellitus     Gastroesophageal reflux disease     Hyperlipidemia     Hypertension     Seasonal allergic rhinitis      History reviewed. No pertinent surgical history.    Tests/Labs:  Lab Results   Component Value Date/Time    HGB 10.0 (L)  01/22/2021 05:32 AM    HCT 30.5 (L) 01/22/2021 05:32 AM    K 5.8 (H) 01/22/2021 05:32 AM    NA 132 (L) 01/22/2021 05:32 AM    TROPI <0.01 01/21/2021 02:30 PM         Imaging:  CT Reconstruction T-spine    Result Date: 01/21/2021  1. No evidence of lumbar spine fracture. 2. Multilevel degenerative changes throughout the thoracic and lumbar spine most severely affecting the mid and lower lumbar spine. 3. Indeterminate sclerotic lesion within the L5 vertebral body. Please correlate with possible history of possible history of primary neoplasm. Whole-body bone scan date performed to further evaluate this lesion. Fonnie Mu, DO  01/21/2021 5:11 PM    CT Angio Chest (PE study)    Result Date: 01/21/2021  1. No evidence for pulmonary embolism. 2. Large hiatal hernia with partial intrathoracic location of the stomach. Wyatt Portela, MD  01/21/2021 5:01 PM    CT L- Spine without Contrast    Result Date: 01/21/2021  1. No evidence of lumbar spine fracture. 2. Multilevel degenerative changes throughout the thoracic and lumbar spine most severely affecting the mid and lower lumbar spine. 3. Indeterminate sclerotic lesion within the L5 vertebral body. Please correlate with possible history of possible history of primary neoplasm. Whole-body bone scan date performed to further evaluate this lesion. Fonnie Mu, DO  01/21/2021 5:11 PM      Social History:   Lives alone at [facility].  Equipment at home:  rollator  Prior Level of Function:      Cognition: WFL    Mobility: mod I rollator   Feeding: indep   Grooming: indep   Bathing: assist PRN   Dressing: mod I   Toileting: indep    Subjective   "It only hurts when I cough."  Patient is agreeable to participation in the therapy session. Nursing clears patient for therapy.  Patient's Goal:  To feel better  Pain: Pt unable to quantify  Location: back and RLE  Therapist Intervention: positioned for comfort  Patient is satisfied with therapist intervention.    Objective      Precautions:   Precautions  Weight Bearing Status: no restrictions  Other Precautions: falls, orthostatic    Patient is in bed with  Telemetry, PrimaFit (external female catheter), Intravenous Access, and Bed Alarm  in place.       Observation of patient/vitals:   Vitals:    01/22/21 1234 01/22/21 1455 01/22/21 1458 01/22/21 1501   BP: 150/54 156/64 122/67 113/66   Pulse: 78 80 82 85   Resp:       Temp:       TempSrc:       SpO2: 98%      Weight:       Height:           Orientation/Cognition:     Alert and Oriented x 4  Cognition:  follows all commands      Musculoskeletal Examination:     ROM Strength   Neck/ Trunk WFL WFL   RUE WFL WFL   LUE WFL WFL         Sensation: Intact to light touch, denies numbness/tingling throughout BUE  Coordination: Intact gross motor and serial opposition to B hands  Vision: WFL  Hearing: WFL      Functional Mobility:    Supine to sit: supv log roll  Sit to Supine: supv log roll  Sit to stand: SBA STS RW  Stand to sit: SBA  Transfers: toilet SBA  Ambulation: 10' x4 SBA RW    Balance:  Static Sit Balance: good  Dynamic Sit Balance: good  Static Stand Balance: fair+  Dynamic Stand Balance: fair+    Self Care:  Grooming: supv hand hygiene standing sinkside  LB Dressing: mod I briefs mgmt  Toileting: mod I      Endurance: good    Participation:  good    Education:  Educated the Personal assistant to role of occupational therapy, plan of care, goals  of therapy, rationale for progressing mobility and safety with mobility and ADLs, energy conservation techniques, and home safety.    RN notified of session outcome and that patient was left in bed with all needs met and equipment intact.   Safety measures include: handoff to nurse/clin tech/ unit secretary completed, bed alarm activated, oriented to call bell and placed within reach, personal items within reach, assistive device positioned out of reach, and bed placed in lowest position.   Mobility and ADL status posted at bedside  and within E.M.R.    Goals:  Goals  Goal Formulation: Patient  Time For Goal Achievement: other (comment) (goals not indicated)      Therapist PPE during session procedural mask, goggles , face shield , and gloves      Signature:   Jearld Lesch Maelynn Moroney, OTD, OTR/L  01/22/2021  3:28 PM  Phone: [phone]    (For scheduling questions, please contact rehab tech [phone])    Attention MD:   Thank you for allowing [place] to participate in the care of Vanessa Kelly. Regulations from the [employer] for Medicare and Medicaid Services (CMS) require your review and approval of this plan of care.     Please cosign this note indicating you are in agreement with the Therapy Plan of Care so we may initiate the therapy treatment plan.

## 2021-01-22 NOTE — Plan of Care (Signed)
Problem: Safety  Goal: Patient will be free from infection during hospitalization  Outcome: Progressing  Flowsheets (Taken 01/22/2021 2351)  Free from Infection during hospitalization:   Assess and monitor for signs and symptoms of infection   Monitor lab/diagnostic results   Monitor all insertion sites (i.e. indwelling lines, tubes, urinary catheters, and drains)   Encourage patient and family to use good hand hygiene technique     Problem: Psychosocial and Spiritual Needs  Goal: Demonstrates ability to cope with hospitalization/illness  Outcome: Progressing  Flowsheets (Taken 01/22/2021 2351)  Demonstrates ability to cope with hospitalizations/illness:   Encourage verbalization of feelings/concerns/expectations   Provide quiet environment   Assist patient to identify own strengths and abilities   Encourage patient to set small goals for self   Encourage participation in diversional activity   Reinforce positive adaptation of new coping behaviors   Include patient/ patient care companion in decisions   Communicate referral to spiritual care as appropriate     Problem: Compromised Tissue integrity  Goal: Nutritional status is improving  Outcome: Progressing  Flowsheets (Taken 01/22/2021 2351)  Nutritional status is improving:   Assist patient with eating   Allow adequate time for meals   Encourage patient to take dietary supplement(s) as ordered   Collaborate with Clinical Nutritionist   Include patient/patient care companion in decisions related to nutrition

## 2021-01-22 NOTE — Progress Notes (Signed)
01/22/21 1455 01/22/21 1458 01/22/21 1501   Vital Signs   Heart Rate 80 82 85   BP 156/64 122/67 113/66   BP Location Right arm Right arm Right arm   BP Method Automatic Automatic Automatic   MAP (mmHg) 95 85 82   Patient Position Lying Sitting [address], OTD, OTR/L  01/22/2021  3:27 PM  Phone: [phone]  [phone] for Rehab Tech/scheduling questions)

## 2021-01-23 LAB — RESPIRATORY PATHOGEN PANEL WITH COVID-19,PCR
Adenovirus: NOT DETECTED
Bordetella parapertussis PCR: NOT DETECTED
Bordetella pertussis: NOT DETECTED
Chlamydophila pneumoniae: NOT DETECTED
Coronavirus 229E: NOT DETECTED
Coronavirus HKU1: NOT DETECTED
Coronavirus NL63: NOT DETECTED
Coronavirus OC43: NOT DETECTED
Human Metapneumovirus: NOT DETECTED
Human Rhinovirus/Enterovirus: DETECTED — AB
Influenza A/H1: NOT DETECTED
Influenza A/H3: NOT DETECTED
Influenza A: NOT DETECTED
Influenza AH1 - 2009: NOT DETECTED
Influenza B: NOT DETECTED
Mycoplasma pneumoniae: NOT DETECTED
Parainfluenza Virus 1: NOT DETECTED
Parainfluenza Virus 2: NOT DETECTED
Parainfluenza Virus 3: NOT DETECTED
Parainfluenza Virus 4: NOT DETECTED
Respiratory Syncytial Virus: NOT DETECTED
SARS CoV-2 RNA: NOT DETECTED

## 2021-01-23 LAB — GFR: EGFR: 57.4

## 2021-01-23 LAB — GLUCOSE WHOLE BLOOD - POCT
Whole Blood Glucose POCT: 137 mg/dL — ABNORMAL HIGH (ref 70–100)
Whole Blood Glucose POCT: 167 mg/dL — ABNORMAL HIGH (ref 70–100)
Whole Blood Glucose POCT: 169 mg/dL — ABNORMAL HIGH (ref 70–100)
Whole Blood Glucose POCT: 201 mg/dL — ABNORMAL HIGH (ref 70–100)

## 2021-01-23 LAB — HEMOLYSIS INDEX
Hemolysis Index: 4 Index (ref 0–24)
Hemolysis Index: 47 Index — ABNORMAL HIGH (ref 0–24)

## 2021-01-23 LAB — IRON PROFILE
Iron Saturation: 18 % (ref 15–50)
Iron: 45 ug/dL (ref 40–145)
TIBC: 246 ug/dL — ABNORMAL LOW (ref 265–497)
UIBC: 201 ug/dL (ref 126–382)

## 2021-01-23 LAB — RENAL FUNCTION PANEL
Albumin: 3.2 g/dL — ABNORMAL LOW (ref 3.5–5.0)
Anion Gap: 10 (ref 5.0–15.0)
BUN: 29 mg/dL — ABNORMAL HIGH (ref 7.0–21.0)
CO2: 21 mEq/L (ref 17–29)
Calcium: 9.6 mg/dL (ref 7.9–10.2)
Chloride: 103 mEq/L (ref 99–111)
Creatinine: 1.1 mg/dL — ABNORMAL HIGH (ref 0.4–1.0)
Glucose: 154 mg/dL — ABNORMAL HIGH (ref 70–100)
Phosphorus: 3.4 mg/dL (ref 2.3–4.7)
Potassium: 4.5 mEq/L (ref 3.5–5.3)
Sodium: 134 mEq/L — ABNORMAL LOW (ref 135–145)

## 2021-01-23 LAB — FOLATE: Folate: 15.8 ng/mL

## 2021-01-23 LAB — T4, FREE: T4 Free: 0.93 ng/dL (ref 0.69–1.48)

## 2021-01-23 LAB — VITAMIN B12: Vitamin B-12: 1158 pg/mL — ABNORMAL HIGH (ref 211–911)

## 2021-01-23 LAB — CORTISOL 60 MINUTE: Cortisol 60 Minutes: 18.7 ug/dL

## 2021-01-23 LAB — CORTISOL BASELINE: Cortisol Base: <1 ug/dL

## 2021-01-23 LAB — FERRITIN: Ferritin: 86.5 ng/mL (ref 4.60–204.00)

## 2021-01-23 LAB — CORTISOL 30 MINUTE: Cortisol 30 Minutes: 12.9 ug/dL

## 2021-01-23 NOTE — UM Notes (Signed)
Inpatient clinical review 01/23/21    Vanessa Kelly  80 y.o., 1941-01-30    Observation admit 01/22/21 changed to inpatient 01/23/21.  Admit to Inpatient (Order [id number])  Admission  Date: 01/23/2021 Department: [facility]        Assessment/Plan      # Asthmatic bronchitis. Etiology not entirely clear. The patient has no prior history of underlying lung disease.     - flu and covid screen negative  - no evidence of pneumonia on chest x-ray.   - CTA  chest large hiatal hernia with partial intrathoracic location of the  Stomach, no acute abnormality or PE  - ddx include a viral infection with resultant hyperactive airway. Need also to consider with the possibility of gastroesophageal reflux disease (given large hiatal hernia) and resultant bronchospasm. NO eosinophilia   - elevate head of the bed  -Continue nebulization treatments with DuoNeb q.6 h and prn, IV solumedrol  -Start pulmicort bid  - add PPI   - cont azithromycin  - check RVP , PCT     # Large Hiatal hernia  # GERD  - cont PPI     # Uncontrolled HTN  # Hx of orthostatic hypotension  # AKI   # Hyponatremia 133->132  # Hyperkalemia 5.4->5.8     - check orthostatic VS  - cont current hydralazine, holding parameters in place  - Cosyntropin stim test in am,   - kayexalate 30 gx 1  - recheck K this afternoon   - hold ARB     # Bilateral Carotid artery disease   # Prior CVA  -  50-69% stenosis internal carotid arteries.  # CAD s/p CABG 2002  - s/p stents in 2006 and 2019   - Echo normal EF  - ECG normal sinus rhythm,bradycardia,inferior lead q wave  - Mild apical ischemia on PET myocardial fusion scan 01/2020  - not on BB ,takes Candesartan 4 mg if SBP > 160 due to recurrent syncope and OH  - continue Aspirin, statin      #Recurrent fall  - pt was diagnosed with parkinson's but states "I don't have it" and not on meds at this time  - r/o Orthostatic Hypotension  - Fall precaution  -  encourage oral hydration  - PT/OT      #Anemia  - check B12, folate  and iron studies  - TSH  - follow H/H    # ? Parkinsonism   - not on meds  #Type II DM  - Takes Trulicity once daily injection 0.75 mg at home  - On low-dose sliding scale  - HbA1c 5.3     # Depression- resume effexor     # Low back pain  - CT shows -  Indeterminate sclerotic lesion within the L5 vertebral body, multilevel degenerative changes  - bone scan on Monday       Temp:  [97.5 F (36.4 C)-98.5 F (36.9 C)] 97.5 F (36.4 C)  Heart Rate:  [56-73] 56  Resp Rate:  [13-19] 18  BP: (134-236)/(64-98) 134/70  Pulmonary: b/l diffuse expiratory wheezing, no distress      Current Facility-Administered Medications   Medication Dose Route Frequency    albuterol-ipratropium  3 mL Nebulization Q6H SCH    aspirin EC  81 mg Oral Daily    atorvastatin  10 mg Oral Daily    azithromycin  500 mg Oral Daily    budesonide  0.5 mg Nebulization BID    dextrose  250 mL Intravenous Once    enoxaparin  30 mg Subcutaneous Daily    fluticasone  1 spray Each Nare Daily    hydrALAZINE  25 mg Oral BID    insulin lispro  1-4 Units Subcutaneous QHS    insulin lispro  1-8 Units Subcutaneous TID AC    lidocaine  1 patch Transdermal Q24H    methylPREDNISolone  40 mg Intravenous BID    pantoprazole  40 mg Intravenous BID    pregabalin  25 mg Oral BID    ranolazine  500 mg Oral BID    sodium polystyrene  15 g Oral Once    venlafaxine  75 mg Oral QHS     Continuous Infusions:  PRN Meds:.acetaminophen, albuterol, calcium carbonate, [CANCELED] Nursing communication: Adult Hypoglycemia Treatment Algorithm **AND** glucagon (rDNA) **AND** dextrose **AND** dextrose **AND** dextrose, guaiFENesin-codeine, hydrALAZINE, magnesium sulfate, melatonin, naloxone, potassium & sodium phosphates, potassium chloride **AND** potassium chloride, traMADol

## 2021-01-23 NOTE — Plan of Care (Signed)
Problem: Moderate/High Fall Risk Score >5  Goal: Patient will remain free of falls  Outcome: Progressing  Flowsheets (Taken 01/23/2021 1058)  High (Greater than 13): HIGH-Bed alarm on at all times while patient in bed     Problem: Safety  Goal: Patient will be free from injury during hospitalization  Outcome: Progressing  Flowsheets (Taken 01/22/2021 0509 by Elson Clan, RN)  Patient will be free from injury during hospitalization:   Hourly rounding   Ensure appropriate safety devices are available at the bedside   Assess patient's risk for falls and implement fall prevention plan of care per policy   Provide and maintain safe environment   Use appropriate transfer methods  Goal: Patient will be free from infection during hospitalization  Outcome: Progressing     Problem: Pain  Goal: Pain at adequate level as identified by patient  Outcome: Progressing  Flowsheets (Taken 01/22/2021 0509 by Elson Clan, RN)  Pain at adequate level as identified by patient:   Identify patient comfort function goal   Assess for risk of opioid induced respiratory depression, including snoring/sleep apnea. Alert healthcare team of risk factors identified.   Reassess pain within 30-60 minutes of any procedure/intervention, per Pain Assessment, Intervention, Reassessment (AIR) Cycle   Offer non-pharmacological pain management interventions     Problem: Side Effects from Pain Analgesia  Goal: Patient will experience minimal side effects of analgesic therapy  Outcome: Progressing     Problem: Discharge Barriers  Goal: Patient will be discharged home or other facility with appropriate resources  Outcome: Progressing  Flowsheets (Taken 01/23/2021 1127)  Discharge to home or other facility with appropriate resources:   Provide appropriate patient education   Initiate discharge planning   Provide information on available health resources     Problem: Psychosocial and Spiritual Needs  Goal: Demonstrates ability to cope with  hospitalization/illness  Outcome: Progressing  Flowsheets (Taken 01/22/2021 2351 by Erskin Burnet, RN)  Demonstrates ability to cope with hospitalizations/illness:   Encourage verbalization of feelings/concerns/expectations   Provide quiet environment   Assist patient to identify own strengths and abilities   Encourage patient to set small goals for self   Encourage participation in diversional activity   Reinforce positive adaptation of new coping behaviors   Include patient/ patient care companion in decisions   Communicate referral to spiritual care as appropriate     Problem: Compromised Tissue integrity  Goal: Damaged tissue is healing and protected  Outcome: Progressing  Flowsheets (Taken 01/23/2021 1127)  Damaged tissue is healing and protected: Monitor/assess Braden scale every shift  Goal: Nutritional status is improving  Outcome: Progressing

## 2021-01-23 NOTE — Progress Notes (Signed)
MEDICINE PROGRESS NOTE  [facility], DIVISION OF HOSPITALIST MEDICINE    [facility]   [place] Pager: [phone]      Date Time: 01/23/21 10:57 AM  Patient Name: Vanessa Kelly  Attending Physician: Illene Regulus, MD  Today's date: 01/23/2021  Hospital Day: 3    CC: Shortness of breath    Vanessa Kelly is 80 y.o. F with hx of Diabetes mellitus, GERD,Hyperlipidemia, Hypertension, TIA,carotid artery stenosis, CAD s/p CABG, stents, falls, chronic back pain ,OA S/P right TKR, parkinsonism (states she doesn't have it), LE venous insufficiency,orthostatic hypotension who presents with worsening of cough,sob and wheezing x 1 week.Patient had fallen one week PTA when she lost her balance trying to plug something in.Has no sick contact. No chest pain, + chronic LE edema.She started taking tums for heart burn couple of weeks ago. No abdominal pain/N/V/D. N ofever or chest pain.She was found to have AKI,hyponatremia and hyperkalemia    S: Patient seen and examined.Feels better  Cough, sob and wheezing improving. + Low back pain   No chest pain, afebrile  No dizziness or focal weakness  No headache or change in vision       Assessment/Plan     # Asthmatic bronchitis. Etiology not entirely clear. The patient has no prior history of underlying lung disease.Suspect post viral hyperactive airway since RVP + for human rhinovirus     - flu and covid screen negative  - RVP +Human rhinovirus   - no evidence of pneumonia on chest x-ray.   - CTA  chest large hiatal hernia with partial intrathoracic location of the  Stomach, no acute abnormality or PE  - ddx include a viral infection with resultant hyperactive airway. Need also to consider with the possibility of gastroesophageal reflux disease (given large hiatal hernia) and resultant bronchospasm. 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  # CAD s/p CABG 2002  - s/p stents in 2006 and 2019   - Echo normal EF  - ECG normal sinus rhythm,bradycardia,inferior lead q wave  - Mild apical ischemia on PET myocardial fusion scan 01/2020  - not on BB ,takes Candesartan 4 mg if SBP > 160 due to recurrent syncope and OH  - continue Aspirin, statin     #Recurrent fall  - pt was diagnosed with parkinson's but states "I don't have it" and not on meds at this time  - r/o Orthostatic Hypotension  - Fall precaution  -  encourage oral hydration  - PT/OT      #Anemia of chronic disease  - stable  - check B12, folate normal  - TSH - normal  - follow H/H    # Parkinsonism   -  not on meds. Pt reports " I am not accepting it"  - on exam she's tremors, cogwheel rigidity LE  - will ask neuro to see    #Type II DM  - Takes Trulicity once daily injection 0.75 mg at home  - On low-dose sliding scale  - HbA1c 5.3     # Depression- resume effexor    # Low back pain  - CT shows -  Indeterminate sclerotic lesion within the L5 vertebral body, multilevel degenerative changes  - bone scan on Monday       Review of Systems:     Gen: No fever or fatigue  Cardiovascular: No chest pain and leg swelling.   Gastrointestinal: no abdominal pain/N/V/D    Genitourinary: No urinary complaint  Neuro: no change in sensation, no bowel or bladder incontinence  All other systems reviewed and are negative.      Physical Exam:       Temp:  [97.7 F (36.5 C)-98.2 F (36.8 C)]  97.7 F (36.5 C)  Heart Rate:  [64-97] 72  Resp Rate:  [16-18] 17  BP: (106-166)/(54-75) 114/67  Intake and Output Summary-last 24 Hrs:  I/O last 3 completed shifts:  In: -   Out: 2500 [Urine:2500]  No data recorded by O2 Device: None (Room air)    Vitals and nursing note reviewed. Exam conducted with a chaperone present.   Constitutional: Normal appearance, no distress   Mouth: Mucous membranes are moist. + pharyngeal erythema  Eyes: Normal conjunctivae, NIS  Cardiovascular: S1S2 RRR   Pulmonary: b/l diffuse expiratory wheezing, no distress  Abdomen: S/NT/ND/+BS  MSK: chronic b/l LE edema, low back point tenderness  Neurological: Aox3, no gross abnormality,mild tremors and cogwheel rigidity  noted  Psych: normal behavior, normal mood       Meds:   Medications were reviewed:  Scheduled Meds:  Current Facility-Administered Medications   Medication Dose Route Frequency    albuterol-ipratropium  3 mL Nebulization Q6H SCH    aspirin EC  81 mg Oral Daily    atorvastatin  10 mg Oral Daily    azithromycin  500 mg Oral Daily    budesonide  0.5 mg Nebulization BID    dextrose  250 mL Intravenous Once    enoxaparin  30 mg Subcutaneous Daily    fluticasone  1 spray Each Nare Daily    hydrALAZINE  25 mg Oral BID    insulin lispro  1-4 Units Subcutaneous QHS    insulin lispro  1-8 Units Subcutaneous TID AC    lidocaine  1 patch Transdermal Q24H    methylPREDNISolone  40 mg Intravenous BID    pantoprazole  40 mg Intravenous BID    pregabalin  25 mg Oral BID    ranolazine  500 mg Oral BID    sodium polystyrene  15 g Oral Once    venlafaxine  75 mg Oral QHS     Continuous Infusions:  PRN Meds:.acetaminophen, albuterol, calcium carbonate, [CANCELED] Nursing communication: Adult Hypoglycemia Treatment Algorithm **AND** glucagon (rDNA) **AND** dextrose **AND** dextrose **AND** dextrose, guaiFENesin-codeine, hydrALAZINE, magnesium sulfate, melatonin, naloxone, potassium & sodium phosphates, potassium chloride **AND** potassium  chloride, traMADol  DVT prophylaxis Chemical        Lines:     Patient Lines/Drains/Airways Status       Active PICC Line / CVC Line / PIV Line / Drain / Airway / Intraosseous Line / Epidural Line / ART Line / Line / Wound / Pressure Ulcer / NG/OG Tube  Name Placement date Placement time Site Days    Peripheral IV 01/21/21 20 G Standard Right Antecubital 01/21/21  1422  Antecubital  less than 1    Peripheral IV 01/21/21 20 G Left Upper Arm 01/21/21  1558  Upper Arm  less than 1    External Urinary Catheter 01/22/21  0030  --  less than 1                       Labs/Radiology:   Imaging personally reviewed, including: all available   No results found.  Recent Labs     01/23/21  0553 01/22/21  2035 01/22/21  1605 01/22/21  1118   Whole Blood Glucose POCT 137* 183* 156* 149*     Recent Labs   Lab 01/23/21  0534 01/22/21  2151 01/22/21  0532 01/21/21  1430   Sodium 134*  --  132* 133*   Potassium 4.5 4.2 5.8* 5.4*   Chloride 103  --  102 102   BUN 29.0*  --  30.0* 32.0*   Creatinine 1.1*  --  1.3* 1.4*   EGFR 57.4  --  47.4 43.5   Glucose 154*  --  193* 89   Calcium 9.6  --  9.8 9.7     Recent Labs   Lab 01/22/21  0532 01/21/21  1430   WBC 4.74 5.91   Hgb 10.0* 10.0*   Hematocrit 30.5* 30.4*   Platelets 176 191         Recent Labs   Lab 01/22/21  0532 01/21/21  1430   Alkaline Phosphatase 83 84   Bilirubin, Total 0.3 0.3   ALT 27 27   AST (SGOT) 21 22     Recent Labs   Lab 01/21/21  1430   D-Dimer 0.83*       Echocardiogram 12/01/2020    Summary    * Left ventricular ejection fraction is normal with an estimated ejection  fraction of 65-70%.    * There is mild concentric left ventricular hypertrophy.    * Left ventricular diastolic filling parameters demonstrate normal diastolic  function.    * Normal right ventricular systolic function.    * There is trace aortic regurgitation.    * No pulmonary hypertension with estimated right ventricular systolic  pressure of  23 mmHg.    * The IVC is normal in size with > 50%  respiratory variance consistent with  normal RA pressure of 3 mmHg.     PET myocardial perfusion imaging 01/27/2020 - Equivocal. Small sized area of ischemia in the apical wall. EF 85%, no RWMA     Carotid artery Dopplers 05/11/2020  IMPRESSION:   1. Atherosclerotic disease, bilateral 50-69% stenosis internal carotid arteries.       Signed by: Illene Regulus, MD

## 2021-01-24 LAB — PROCALCITONIN: Procalcitonin: 0.03 ng/ml (ref 0.00–0.10)

## 2021-01-24 LAB — GLUCOSE WHOLE BLOOD - POCT
Whole Blood Glucose POCT: 114 mg/dL — ABNORMAL HIGH (ref 70–100)
Whole Blood Glucose POCT: 216 mg/dL — ABNORMAL HIGH (ref 70–100)
Whole Blood Glucose POCT: 164 mg/dL — ABNORMAL HIGH (ref 70–100)
Whole Blood Glucose POCT: 190 mg/dL — ABNORMAL HIGH (ref 70–100)

## 2021-01-24 LAB — RENAL FUNCTION PANEL
Albumin: 3.2 g/dL — ABNORMAL LOW (ref 3.5–5.0)
Anion Gap: 10 (ref 5.0–15.0)
BUN: 29 mg/dL — ABNORMAL HIGH (ref 7.0–21.0)
CO2: 22 mEq/L (ref 17–29)
Calcium: 9.6 mg/dL (ref 7.9–10.2)
Chloride: 104 mEq/L (ref 99–111)
Creatinine: 1.1 mg/dL — ABNORMAL HIGH (ref 0.4–1.0)
Glucose: 205 mg/dL — ABNORMAL HIGH (ref 70–100)
Phosphorus: 3.1 mg/dL (ref 2.3–4.7)
Potassium: 4.1 mEq/L (ref 3.5–5.3)
Sodium: 136 mEq/L (ref 135–145)

## 2021-01-24 LAB — GFR: EGFR: 57.4

## 2021-01-24 MED ORDER — GUAIFENESIN-CODEINE 100-10 MG/5ML PO SYRP
5.0000 mL | ORAL_SOLUTION | ORAL | Status: DC | PRN
Start: 2021-01-24 — End: 2021-02-02
  Administered 2021-01-24 – 2021-02-02 (×18): 5 mL via ORAL
  Filled 2021-01-24 (×18): qty 5

## 2021-01-24 MED ORDER — HYDRALAZINE HCL 25 MG PO TABS
50.0000 mg | ORAL_TABLET | Freq: Two times a day (BID) | ORAL | Status: DC
Start: 2021-01-24 — End: 2021-01-26
  Administered 2021-01-24 – 2021-01-26 (×5): 50 mg via ORAL
  Filled 2021-01-24 (×5): qty 2

## 2021-01-24 MED ORDER — ENOXAPARIN SODIUM 40 MG/0.4ML IJ SOSY
40.0000 mg | PREFILLED_SYRINGE | Freq: Every day | INTRAMUSCULAR | Status: DC
Start: 2021-01-25 — End: 2021-02-02
  Administered 2021-01-25 – 2021-02-02 (×9): 40 mg via SUBCUTANEOUS
  Filled 2021-01-24 (×9): qty 0.4

## 2021-01-24 NOTE — Plan of Care (Signed)
Problem: Safety  Goal: Patient will be free from infection during hospitalization  Outcome: Progressing  Flowsheets (Taken 01/24/2021 0130)  Free from Infection during hospitalization:   Assess and monitor for signs and symptoms of infection   Monitor lab/diagnostic results   Monitor all insertion sites (i.e. indwelling lines, tubes, urinary catheters, and drains)   Encourage patient and family to use good hand hygiene technique     Problem: Psychosocial and Spiritual Needs  Goal: Demonstrates ability to cope with hospitalization/illness  Outcome: Progressing  Flowsheets (Taken 01/24/2021 0130)  Demonstrates ability to cope with hospitalizations/illness:   Encourage verbalization of feelings/concerns/expectations   Provide quiet environment   Assist patient to identify own strengths and abilities   Encourage patient to set small goals for self   Encourage participation in diversional activity   Reinforce positive adaptation of new coping behaviors   Include patient/ patient care companion in decisions   Communicate referral to spiritual care as appropriate     Problem: Compromised Tissue integrity  Goal: Nutritional status is improving  Outcome: Progressing  Flowsheets (Taken 01/24/2021 0130)  Nutritional status is improving:   Assist patient with eating   Allow adequate time for meals   Encourage patient to take dietary supplement(s) as ordered   Collaborate with Clinical Nutritionist   Include patient/patient care companion in decisions related to nutrition

## 2021-01-24 NOTE — Consults (Signed)
NEUROLOGY CONSULTATION    Date Time: 01/24/21 3:36 PM  Patient Name: [name]  Attending Physician: Illene Regulus, MD      Assessment & Plan:   Recurrent falls of unclear etiology  Questionable diagnosis of Parkinson's no clear-cut evidence of Parkinson on my current exam  Head tremor, could be due to essential tremor, versus cervical dystonia  Check nutritional markers  Wait on bone scan results--- accordingly we will order further targeted neuroimaging of the spine to see if there is any other causes for falls  Will discuss with PT regarding gait assessment to see if any parkinsonian features noted  Consider Inderal, or primidone for head tremor Will discuss with patient and family and then make a decision  Consider stopping Lyrica given renal dysfunction tremors and falls all of this could be side effects of Lyrica  Please try and obtain results of DAT scan  Will follow-up        History of Present Illness:   80 year old lady admitted to the hospital because of cough and possibility of bronchitis.  Patient was recently told that she may have some Parkinson's disease since neurology consultation is requested she reports to me recurrent falls recently, where she loses her balance and tends to fall she also feels that her legs sometimes get wobbly and shaky          Recent Labs   Lab 01/22/21  0532   Hemoglobin A1C 5.3                Past Medical History:     Past Medical History:   Diagnosis Date    Diabetes mellitus     Gastroesophageal reflux disease     Hyperlipidemia     Hypertension     Seasonal allergic rhinitis        Meds:   Albuterol aspirin Lipitor Zithromax Pulmicort Lovenox Flonase hydralazine Solu-Medrol Protonix Lyrica Ranexa Effexor      Allergies   Allergen Reactions    Fentanyl Anaphylaxis     Hives    Percolone [Oxycodone]        Social & Family History:     Social History     Socioeconomic History    Marital status: Married   Tobacco Use    Smoking status: Former     Types:  Cigarettes     Quit date: 1990     Years since quitting: 32.7    Smokeless tobacco: Never   Vaping Use    Vaping Use: Never used   Substance and Sexual Activity    Alcohol use: Not Currently    Drug use: Never       Family History   Problem Relation Age of Onset    Heart failure Mother     Myocardial Infarction Father            CODE STATUS: Full code  I personally reviewed all of the medications.  Medication list generated using all available resources.  Elder abuse (physical)  - negative  Advanced care plan - reviewed from chart or in discussion with pt or family    Review of Systems:   No headache, eye, ear nose, throat problems; no coughing or wheezing or shortness of breath, No chest pain or orthopnea, no abdominal pain, nausea or vomiting, No pain in the body or extremities, no psychiatric, neurological, endocrine, hematological or cardiac complaints except as noted above.         Physical Exam:   Blood  pressure 174/68, pulse 71, temperature 98.1 F (36.7 C), resp. rate 18, height 1.753 m (5' 9.02"), weight 83.4 kg (183 lb 14.4 oz), SpO2 96 %.    HEENT: Normocephalic.no carotid bruits coughing violently from time to time  Lungs:  CTA bil  Abd Soft   Cardiac:  S1,S2, normal rate and rhythm  Neck: supple, no cartoid bruits  Extremities: no edema  Skin: no rashes seen in exposed areas     Neuro:  Level of consciousness:  Alert and appropriate  Oriented:  X 3  Cognition:  Intact naming, recognition, concentration and following complex commands  Cranial Nerves:  II-XII intact  Strength:  No upper extremity drift, 5/5 strength x 4 extremities  Coordination:  Intact FTN testing  Reflexes:  +1 throughout, down going toes bil  Sensation: Intact x 4 extremities to LT, temp,  Gait not assessed today  No cogwheeling no tremor no resting tremor moving both upper and lower extremities well tone in both upper and lower extremities was normal  Prominent head tremor noted  Labs:     Recent Labs   Lab 01/24/21  0335  01/23/21  0534 01/22/21  2151 01/22/21  0532 01/21/21  1430   Glucose 205* 154*  --  193* 89   BUN 29.0* 29.0*  --  30.0* 32.0*   Creatinine 1.1* 1.1*  --  1.3* 1.4*   Calcium 9.6 9.6  --  9.8 9.7   Sodium 136 134*  --  132* 133*   Potassium 4.1 4.5 4.2 5.8* 5.4*   Chloride 104 103  --  102 102   CO2 22 21  --  24 25   Albumin 3.2* 3.2*  --  3.4* 3.6   Phosphorus 3.1 3.4  --   --   --    Magnesium  --   --   --   --  1.8   AST (SGOT)  --   --   --  21 22   ALT  --   --   --  27 27   Bilirubin, Total  --   --   --  0.3 0.3   Alkaline Phosphatase  --   --   --  83 84     Recent Labs   Lab 01/22/21  0532 01/21/21  1430   WBC 4.74 5.91   Hgb 10.0* 10.0*   Hematocrit 30.5* 30.4*   MCV 99.3* 100.3*   MCH 32.6 33.0   MCHC 32.8 32.9   Platelets 176 191         No results for input(s): PTT, PT, INR in the last 72 hours.       Radiology Results (24 Hour)       ** No results found for the last 24 hours. **             All recent brain and spine imaging (MRI, CT) personally reviewed.    Chart reviewed    Case discussed with:  minutes; >50% time spent in counseling or coordination of care        This note was generated by the Epic EMR system/Speech recognition and may contain inherent errors or omissions not intended by the user. Grammatical errors, random word insertions, deletions and pronoun errors  are occasional consequences of this technology due to software limitations.   Not all errors are caught or corrected. If there are questions or concerns about the content of this note or information contained within the body  of this dictation they should be addressed directly with the author for clarification.    Signed by: Dorothea Glassman, MD, MD  Spectralink: [phone]      Answering Service: [phone]

## 2021-01-24 NOTE — Progress Notes (Signed)
MEDICINE PROGRESS NOTE  [facility], DIVISION OF HOSPITALIST MEDICINE   [facility]   [place] Pager: [phone]      Date Time: 01/24/21 10:50 AM  Patient Name: Vanessa Kelly  Attending Physician: Illene Regulus, MD  Today's date: 01/24/2021  Hospital Day: 4    CC: Shortness of breath    Vanessa Kelly is 80 y.o. F with hx of Diabetes mellitus, GERD,Hyperlipidemia, Hypertension, TIA,carotid artery stenosis, CAD s/p CABG, stents, falls, chronic back pain ,OA S/P right TKR, parkinsonism (states she doesn't have it), LE venous insufficiency,orthostatic hypotension who presents with worsening of cough,sob and wheezing x 1 week.Patient had fallen one week PTA when she lost her balance trying to plug something in.Has no sick contact. No chest pain, + chronic LE edema.She started taking tums for heart burn couple of weeks ago. No abdominal pain/N/V/D. N ofever or chest pain.She was found to have AKI,hyponatremia and hyperkalemia    S: Patient seen and examined. She feels better when compared to admission.Continues to report cough, sob and wheezing (better from admission). 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 Etiology not entirely clear. The patient has no prior history of underlying lung disease.Suspect post viral hyperactive airway since RVP + for human rhinovirus     - flu and covid screen negative  - RVP +Human rhinovirus   - no evidence of pneumonia on  chest x-ray.   - CTA  chest large hiatal hernia with partial intrathoracic location of the  Stomach, no acute abnormality or PE  - ddx include a viral infection with resultant hyperactive airway. Need also to consider with the possibility of gastroesophageal reflux disease (given large hiatal hernia) and resultant bronchospasm. NO eosinophilia   - elevate head of the bed  -Continue neb treatments with DuoNeb q.6 h and prn, IV solumedrol, pulmicort bid  - cont PPI     # Large Hiatal hernia  # GERD  - cont PPI    # Uncontrolled HTN  # AKI - improving cr 1.4->1.3->1.1->1.1  # Hyponatremia 133->132->134->136  # Hyperkalemia improved 5.4->5.8->4.5    - cont current hydralazine, holding parameters in place  - Cosyntropin stim test in am,   - s/p kayexalate   - follow consyntropin stim test  - hold ARB    # Bilateral Carotid artery disease   # Prior CVA  -  50-69% stenosis internal carotid arteries.  # CAD s/p CABG 2002  - s/p stents in 2006 and 2019   - Echo normal EF  - ECG normal sinus rhythm,bradycardia,inferior lead q wave  - Mild apical ischemia on PET myocardial fusion scan 01/2020  - not on BB ,off ARB d/t hyperkalemia,AKI  - continue Aspirin, statin     #Recurrent falls  - pt was diagnosed with parkinson's but states "I don't have it" and  not on meds at this time  - r/o Orthostatic Hypotension  - Fall precaution  -  encourage oral hydration  - PT/OT      #Anemia of chronic disease  - stable  - check B12, folate normal  - TSH - normal  - follow H/H    # Tremors  # Parkinsonism   - not on meds. Pt reports " I am not accepting it", states that she was told  - on exam she's tremors, cogwheel rigidity LE,imbalance  - will ask neuro to see    #Type II DM  - Takes Trulicity once daily injection 0.75 mg at home  - On low-dose sliding scale  - HbA1c 5.3     # Depression- resume effexor    # Low back pain  - CT shows -  Indeterminate sclerotic lesion within the L5 vertebral body, multilevel degenerative changes  - bone scan  on Monday       Review of Systems:     Gen: No fever or fatigue  Cardiovascular: No chest pain and leg swelling.   Gastrointestinal: no abdominal pain/N/V/D    Genitourinary: No urinary complaint  Neuro: no change in sensation, no bowel or bladder incontinence  All other systems reviewed and are negative.      Physical Exam:       Temp:  [97.5 F (36.4 C)-98.1 F (36.7 C)] 97.9 F (36.6 C)  Heart Rate:  [68-86] 68  Resp Rate:  [14-20] 14  BP: (148-177)/(61-70) 154/64  Intake and Output Summary-last 24 Hrs:  I/O last 3 completed shifts:  In: -   Out: 2700 [Urine:2700]  No data recorded by O2 Device: None (Room air)    Vitals and nursing note reviewed. Exam conducted with a chaperone present.   Constitutional: Normal appearance, no distress   Mouth: Mucous membranes are moist. + pharyngeal erythema  Eyes: Normal conjunctivae, NIS  Cardiovascular: S1S2 RRR   Pulmonary: b/l diffuse expiratory wheezing, no distress  Abdomen: S/NT/ND/+BS  MSK: chronic b/l LE edema, low back point tenderness  Neurological: Aox3, no gross abnormality,mild tremors and cogwheel rigidity  noted  Psych: normal behavior, normal mood       Meds:   Medications were reviewed:  Scheduled Meds:  Current Facility-Administered Medications   Medication Dose Route Frequency    albuterol-ipratropium  3 mL Nebulization Q6H SCH    aspirin EC  81 mg Oral Daily    atorvastatin  10 mg Oral Daily    azithromycin  500 mg Oral Daily    budesonide  0.5 mg Nebulization BID    dextrose  250 mL Intravenous Once    enoxaparin  30 mg Subcutaneous Daily    fluticasone  1 spray Each Nare Daily    hydrALAZINE  25 mg Oral BID    insulin lispro  1-4 Units Subcutaneous QHS    insulin lispro  1-8 Units Subcutaneous TID AC    lidocaine  1 patch Transdermal Q24H    methylPREDNISolone  40 mg Intravenous BID    pantoprazole  40 mg Intravenous BID    pregabalin  25 mg Oral BID    ranolazine  500 mg Oral BID    sodium polystyrene  15 g Oral Once    venlafaxine  75 mg Oral QHS      Continuous Infusions:  PRN Meds:.acetaminophen, albuterol, calcium carbonate, [CANCELED] Nursing communication: Adult Hypoglycemia Treatment Algorithm **AND** glucagon (rDNA) **AND** dextrose **AND** dextrose **AND** dextrose, hydrALAZINE, magnesium sulfate, melatonin, naloxone, potassium &  sodium phosphates, potassium chloride **AND** potassium chloride, traMADol  DVT prophylaxis Chemical        Lines:     Patient Lines/Drains/Airways Status       Active PICC Line / CVC Line / PIV Line / Drain / Airway / Intraosseous Line / Epidural Line / ART Line / Line / Wound / Pressure Ulcer / NG/OG Tube       Name Placement date Placement time Site Days    Peripheral IV 01/21/21 20 G Standard Right Antecubital 01/21/21  1422  Antecubital  less than 1    Peripheral IV 01/21/21 20 G Left Upper Arm 01/21/21  1558  Upper Arm  less than 1    External Urinary Catheter 01/22/21  0030  --  less than 1                       Labs/Radiology:   Imaging personally reviewed, including: all available   No results found.  Recent Labs     01/24/21  0908 01/23/21  2009 01/23/21  1537 01/23/21  1104   Whole Blood Glucose POCT 114* 201* 169* 167*     Recent Labs   Lab 01/24/21  0335 01/23/21  0534 01/22/21  2151 01/22/21  0532 01/21/21  1430   Sodium 136 134*  --  132* 133*   Potassium 4.1 4.5 4.2 5.8* 5.4*   Chloride 104 103  --  102 102   BUN 29.0* 29.0*  --  30.0* 32.0*   Creatinine 1.1* 1.1*  --  1.3* 1.4*   EGFR 57.4 57.4  --  47.4 43.5   Glucose 205* 154*  --  193* 89   Calcium 9.6 9.6  --  9.8 9.7     Recent Labs   Lab 01/22/21  0532 01/21/21  1430   WBC 4.74 5.91   Hgb 10.0* 10.0*   Hematocrit 30.5* 30.4*   Platelets 176 191         Recent Labs   Lab 01/22/21  0532 01/21/21  1430   Alkaline Phosphatase 83 84   Bilirubin, Total 0.3 0.3   ALT 27 27   AST (SGOT) 21 22     Recent Labs   Lab 01/21/21  1430   D-Dimer 0.83*     Results for orders placed or performed during the hospital encounter of 01/21/21 (from the past 360 hour(s))    COVID-19 (SARS-CoV-2) only ([facility]) asymptomatic admission - Hospitals    Specimen: Nasopharyngeal   Result Value Ref Range    Purpose of COVID testing Screening     SARS-CoV-2 Specimen Source Nasal Swab     SARS CoV 2 Overall Result Not Detected    Respiratory Pathogen Panel w/COVID-19, PCR    Specimen: Nasopharyngeal   Result Value Ref Range    Adenovirus Not Detected     Coronavirus 229E Not Detected     Coronavirus HKU1 Not Detected     Coronavirus NL63 Not Detected     Coronavirus OC43 Not Detected     SARS CoV-2 RNA Not Detected     Human Metapneumovirus Not Detected     Human Rhinovirus/Enterovirus Detected (A)     Influenza A Not Detected     Influenza A/H1 Not Detected     Influenza AH1 - 2009 Not Detected     Influenza A/H3 Not Detected     Influenza B Not Detected     Parainfluenza Virus 1 Not Detected  Parainfluenza Virus 2 Not Detected     Parainfluenza Virus 3 Not Detected     Parainfluenza Virus 4 Not Detected     Respiratory Syncytial Virus Not Detected     Bordetella pertussis Not Detected     Bordetella parapertussis PCR Not Detected     Chlamydophila pneumoniae Not Detected     Mycoplasma pneumoniae Not Detected     Source NP Swab     Test Comment for Respiratory Panel with COVID See Below     Purpose of COVID testing Diagnostic -PUI       Echocardiogram 12/01/2020    Summary    * Left ventricular ejection fraction is normal with an estimated ejection  fraction of 65-70%.    * There is mild concentric left ventricular hypertrophy.    * Left ventricular diastolic filling parameters demonstrate normal diastolic  function.    * Normal right ventricular systolic function.    * There is trace aortic regurgitation.    * No pulmonary hypertension with estimated right ventricular systolic  pressure of  23 mmHg.    * The IVC is normal in size with > 50% respiratory variance consistent with  normal RA pressure of 3 mmHg.     PET myocardial perfusion imaging 01/27/2020 - Equivocal. Small sized area of  ischemia in the apical wall. EF 85%, no RWMA     Carotid artery Dopplers 05/11/2020  IMPRESSION:   1. Atherosclerotic disease, bilateral 50-69% stenosis internal carotid arteries.       Signed by: Illene Regulus, MD

## 2021-01-24 NOTE — Plan of Care (Signed)
Orthostatic BP completed-negative. OOB to chair for meal.  Problem: Moderate/High Fall Risk Score >5  Goal: Patient will remain free of falls  Outcome: Progressing  Flowsheets (Taken 01/23/2021 1935 by Erskin Burnet, RN)  High (Greater than 13): HIGH-Bed alarm on at all times while patient in bed     Problem: Safety  Goal: Patient will be free from injury during hospitalization  Outcome: Progressing  Flowsheets (Taken 01/22/2021 0509 by Elson Clan, RN)  Patient will be free from injury during hospitalization:   Hourly rounding   Ensure appropriate safety devices are available at the bedside   Assess patient's risk for falls and implement fall prevention plan of care per policy   Provide and maintain safe environment   Use appropriate transfer methods  Goal: Patient will be free from infection during hospitalization  Outcome: Progressing  Flowsheets (Taken 01/24/2021 0130 by Erskin Burnet, RN)  Free from Infection during hospitalization:   Assess and monitor for signs and symptoms of infection   Monitor lab/diagnostic results   Monitor all insertion sites (i.e. indwelling lines, tubes, urinary catheters, and drains)   Encourage patient and family to use good hand hygiene technique     Problem: Pain  Goal: Pain at adequate level as identified by patient  Outcome: Progressing  Flowsheets (Taken 01/22/2021 0509 by Elson Clan, RN)  Pain at adequate level as identified by patient:   Identify patient comfort function goal   Assess for risk of opioid induced respiratory depression, including snoring/sleep apnea. Alert healthcare team of risk factors identified.   Reassess pain within 30-60 minutes of any procedure/intervention, per Pain Assessment, Intervention, Reassessment (AIR) Cycle   Offer non-pharmacological pain management interventions     Problem: Side Effects from Pain Analgesia  Goal: Patient will experience minimal side effects of analgesic therapy  Outcome: Progressing  Flowsheets (Taken  01/24/2021 1302)  Patient will experience minimal side effects of analgesic therapy:   Monitor/assess patient's respiratory status (RR depth, effort, breath sounds)   Assess for changes in cognitive function     Problem: Discharge Barriers  Goal: Patient will be discharged home or other facility with appropriate resources  Outcome: Progressing  Flowsheets (Taken 01/23/2021 1127)  Discharge to home or other facility with appropriate resources:   Provide appropriate patient education   Initiate discharge planning   Provide information on available health resources     Problem: Psychosocial and Spiritual Needs  Goal: Demonstrates ability to cope with hospitalization/illness  Outcome: Progressing  Flowsheets (Taken 01/24/2021 0130 by Erskin Burnet, RN)  Demonstrates ability to cope with hospitalizations/illness:   Encourage verbalization of feelings/concerns/expectations   Provide quiet environment   Assist patient to identify own strengths and abilities   Encourage patient to set small goals for self   Encourage participation in diversional activity   Reinforce positive adaptation of new coping behaviors   Include patient/ patient care companion in decisions   Communicate referral to spiritual care as appropriate     Problem: Compromised Tissue integrity  Goal: Damaged tissue is healing and protected  Outcome: Progressing  Flowsheets (Taken 01/23/2021 1127)  Damaged tissue is healing and protected: Monitor/assess Braden scale every shift  Goal: Nutritional status is improving  Outcome: Progressing

## 2021-01-24 NOTE — Plan of Care (Signed)
Problem: Moderate/High Fall Risk Score >5  Goal: Patient will remain free of falls  Outcome: Progressing  Flowsheets (Taken 01/24/2021 2320)  VH High Risk (Greater than 13):   ALL REQUIRED LOW INTERVENTIONS   ALL REQUIRED MODERATE INTERVENTIONS   A CHAIR PAD ALARM WILL BE USED WHEN PATIENT IS UP SITTING IN A CHAIR   PATIENT IS TO BE SUPERVISED FOR ALL TOILETING ACTIVITIES   A safety companion may be used when deemed appropriate by the Primary RN and Clinical Administrator   Keep door open for better visibility   Include family/significant other in multidisciplinary discussion regarding plan of care as appropriate   Use assistive devices   Use chair-pad alarm device     Problem: Pain  Goal: Pain at adequate level as identified by patient  Outcome: Progressing  Flowsheets (Taken 01/24/2021 2320)  Pain at adequate level as identified by patient:   Identify patient comfort function goal   Assess for risk of opioid induced respiratory depression, including snoring/sleep apnea. Alert healthcare team of risk factors identified.   Assess pain on admission, during daily assessment and/or before any "as needed" intervention(s)   Reassess pain within 30-60 minutes of any procedure/intervention, per Pain Assessment, Intervention, Reassessment (AIR) Cycle   Evaluate if patient comfort function goal is met   Evaluate patient's satisfaction with pain management progress   Offer non-pharmacological pain management interventions   Include patient/patient care companion in decisions related to pain management as needed     Problem: Compromised Tissue integrity  Goal: Nutritional status is improving  Outcome: Progressing  Flowsheets (Taken 01/24/2021 2320)  Nutritional status is improving:   Assist patient with eating   Allow adequate time for meals   Encourage patient to take dietary supplement(s) as ordered   Include patient/patient care companion in decisions related to nutrition   Collaborate with Clinical Nutritionist

## 2021-01-25 ENCOUNTER — Inpatient Hospital Stay: Payer: Medicare Other

## 2021-01-25 DIAGNOSIS — E875 Hyperkalemia: Secondary | ICD-10-CM

## 2021-01-25 DIAGNOSIS — K219 Gastro-esophageal reflux disease without esophagitis: Secondary | ICD-10-CM

## 2021-01-25 DIAGNOSIS — N179 Acute kidney failure, unspecified: Secondary | ICD-10-CM

## 2021-01-25 DIAGNOSIS — E1165 Type 2 diabetes mellitus with hyperglycemia: Secondary | ICD-10-CM

## 2021-01-25 DIAGNOSIS — E871 Hypo-osmolality and hyponatremia: Secondary | ICD-10-CM

## 2021-01-25 DIAGNOSIS — E2740 Unspecified adrenocortical insufficiency: Secondary | ICD-10-CM

## 2021-01-25 DIAGNOSIS — J45901 Unspecified asthma with (acute) exacerbation: Secondary | ICD-10-CM

## 2021-01-25 LAB — ECG 12-LEAD
Atrial Rate: 59 {beats}/min
P Axis: 43 degrees
P-R Interval: 234 ms
Q-T Interval: 402 ms
QRS Duration: 102 ms
QTC Calculation (Bezet): 397 ms
R Axis: -13 degrees
T Axis: 26 degrees
Ventricular Rate: 59 {beats}/min

## 2021-01-25 LAB — GLUCOSE WHOLE BLOOD - POCT
Whole Blood Glucose POCT: 146 mg/dL — ABNORMAL HIGH (ref 70–100)
Whole Blood Glucose POCT: 238 mg/dL — ABNORMAL HIGH (ref 70–100)
Whole Blood Glucose POCT: 180 mg/dL — ABNORMAL HIGH (ref 70–100)
Whole Blood Glucose POCT: 206 mg/dL — ABNORMAL HIGH (ref 70–100)

## 2021-01-25 MED ORDER — TECHNETIUM TC 99M OXIDRONATE INJECTION
25.0000 | Freq: Once | Status: AC | PRN
Start: 2021-01-25 — End: 2021-01-25
  Administered 2021-01-25: 13:00:00 25 via INTRAVENOUS

## 2021-01-25 NOTE — PT Progress Note (Signed)
[facility]  [address]  [place] [address]  [phone]    Physical Therapy Treatment    Patient:  Vanessa Kelly        MRN#:  [id number]  Unit:  [facility] INTERMEDIATE CARE        Room/Bed:  MI626/MI626-02    Medical Diagnosis: Shortness of breath [R06.02]  Hiatal hernia [K44.9]  Hyponatremia [E87.1]  Hypoglycemia [E16.2]  Lesion of vertebra [M89.9]  Anemia, unspecified type [D64.9]  Acute bilateral low back pain without sciatica [M54.50]    Time of treatment:  PT Received On: 01/25/21  Start Time: 1445 Stop Time: 1524  Time Calculation (min): 39 min       Treatment #: PT Visit Number: 1/5    Patient's medical condition is appropriate for Physical Therapy intervention at this time.    Interpreter utilized: no, not indicated    Assessment   Pt was received supine in bed completing lunch. Pt required CGA to EOB. Abd binder was donned dependently at EOB. Pt stood with CGA with extra time with bed elevated. Pt ambulated about 20' with RW in the hall, then completed 360 deg turns x2 with CGA- min a for MD assessment.  Pt then continued gait training x 150' with RW and CGA progressing to SBA with slow cadence. Pt required CGA for commode transfers and min a for undergarment management. Pt was left supine in bed after session with all needs in reach. Continue to recommend return to ALF with HHPT.     PMP - Progressive Mobility Protocol   PMP Activity: Step 7 - Walks out of Room  Distance Walked (ft) (Step 6,7): 150 Feet     Plan   Based on today's performance:  Discharge Recommendation: ALF;Home with home health PT   DME Recommended for Discharge: No additional equipment/DME recommended at this time       Transport Recommendations: wheelchair van/transport    Discharge recommendations are based on patient's progression/regression. Please see most recent note for updated discharge recommendations.    Continue plan of care.    Interdisciplinary Communication: RN      Subjective   "It feels good to  walk"  Patient is agreeable to participation in the therapy session. Nursing clears patient for therapy.  Pain: Pt unable to quantify  Location: back pain with coughing  Therapist Intervention: positioned for comfort  Patient is satisfied with therapist intervention.    Vitals:   Vitals:    01/25/21 0824 01/25/21 1000 01/25/21 1200 01/25/21 1211   BP: 176/68   160/81   Pulse:  67 69 70   Resp:    16   Temp:    97.9 F (36.6 C)   TempSrc:    Oral   SpO2:    100%   Weight:       Height:           Objective     Precautions/ Contraindications:   Precautions  Weight Bearing Status: no restrictions  Other Precautions: Falls, monitor BP for orthostatic hypotension    Patient is in bed with  Telemetry, PrimaFit (external female catheter), and Intravenous Access in place.          Functional Mobility:  Rolling: CGA  Supine to Sit: CGA  Scooting: CGA  Sit to Supine: CGA  Sit to Stand: CGA  Stand to Sit: CGA  Transfer Training: CGA    Gait:   WB status: no restrictions  Assistive Device: RW  Assist  Level: CGA - min a with turns   Distance: 150'  Pattern: dec cadence, normal BOS, dec step length [place]     Self Care:   LB Dressing: min a to don brief  Toileting: CGA     Educated the patient to role of physical therapy, plan of care, goals  of therapy, rationale for progressing mobility and safety with mobility and ADLs.    RN notified of session outcome and that patient was left in bed with all needs met and equipment intact.   Safety measures include: handoff to nurse/clin tech/ unit secretary completed, oriented to call bell and placed within reach, personal items within reach, assistive device positioned out of reach, and bed placed in lowest position.   Mobility and ADL status posted at bedside and within E.M.R.    Goals per Eval/ Re-eval:   Goals  Goal Formulation: With patient  Time for Goal Acheivement: 5 visits  Goals: Select goal  Pt Will Go Supine To Sit: modified independent  Pt Will Perform Sit To Supine:  modified independent  Pt Will Transfer Bed/Chair: with rolling walker, modified independent  Pt Will Ambulate: > 200 feet, with four wheel walker, with rolling walker, modified independent        Therapist PPE during session procedural mask and gloves     Signature:  Sedonia Small, PT  01/25/2021 3:45 PM   Phone: [phone]     (For scheduling questions, please contact rehab tech [phone])

## 2021-01-25 NOTE — Progress Notes (Signed)
MEDICINE PROGRESS NOTE  [facility], DIVISION OF HOSPITALIST MEDICINE   [facility]   [place] Pager: [phone]      Date Time: 01/25/21 10:10 AM  Patient Name: Vanessa Kelly  Attending Physician: Illene Regulus, MD  Today's date: 01/25/2021  Hospital Day: 5    CC: Shortness of breath    CORITA ALLINSON is 80 y.o. F with hx of Diabetes mellitus, GERD,Hyperlipidemia, Hypertension, TIA,carotid artery stenosis, CAD s/p CABG, stents, falls, chronic back pain ,OA S/P right TKR, parkinsonism (states she doesn't have it), LE venous insufficiency,orthostatic hypotension who presents with worsening of cough,sob and wheezing x 1 week.Patient had fallen one week PTA when she lost her balance trying to plug something in.Has no sick contact. No chest pain, + chronic LE edema.She started taking tums for heart burn couple of weeks ago. No abdominal pain/N/V/D. 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 Etiology not entirely clear. The patient has no prior history of underlying lung disease.Suspect post viral hyperactive airway since RVP + for human rhinovirus     - flu and covid screen negative  - RVP +Human rhinovirus   - no evidence of pneumonia on chest x-ray.   - CTA  chest large hiatal hernia with partial intrathoracic location of the  Stomach, no acute abnormality or PE  - ddx include a viral infection with resultant  hyperactive airway. Need also to consider with the possibility of gastroesophageal reflux disease (given large hiatal hernia) and resultant bronchospasm. NO eosinophilia   - elevate head of the bed  -Continue neb treatments with DuoNeb q.6 h and prn, IV solumedrol, pulmicort bid  - cont PPI   - start tapering steroids     #Recurrent falls  # Imbalance   # Head tremor   - pt was diagnosed with parkinson's but states "I don't have it" and not on meds at this time  - neuro has been asked to assist  - FU on MRI of brain and Cervical spine  - Fall precaution  -  encourage oral hydration  - PT/OT    # Large Hiatal hernia  # GERD  - cont PPI    # Uncontrolled HTN  # AKI - improving cr 1.4->1.3->1.1->1.1  # Hyponatremia 133->132->134->136  # Hyperkalemia improved 5.4->5.8->4.5    - cont current hydralazine 50 mg bid, holding parameters in place  - Cosyntropin stim test in am,   - s/p kayexalate   - hold ARB    # Bilateral Carotid artery disease   # Prior CVA  -  50-69% stenosis internal carotid arteries.  # CAD s/p CABG 2002  - s/p stents in 2006 and 2019   - Echo normal EF  - ECG normal sinus rhythm,bradycardia,inferior lead q wave  - Mild apical ischemia on PET myocardial fusion scan 01/2020  - not  on BB ,off ARB d/t hyperkalemia,AKI  - continue Aspirin, statin       #Anemia of chronic disease  - stable  - check B12, folate normal  - TSH - normal  - follow H/H    #Type II DM  - Takes Trulicity once daily injection 0.75 mg at home  - On low-dose sliding scale  - HbA1c 5.3     # Depression- resume effexor    # Low back pain  - MRI L-spine in Aug - multilevel degenerative disc disease and facet arthropathy with up to  mild spinal canal narrowing and moderate-severe bilateral neuroforaminal  Narrowing,mixed degenerative endplate changes with type I changes at L5 and L3.   - CT shows -  Indeterminate sclerotic lesion within the L5 vertebral body, multilevel degenerative changes  - bone scan - indeterminate abnormality at L5        Interval Event/Review of Systems:   Patient seen and examined. She feels better when compared to admission.Reports improving cough, sob and wheezing . + Low back pain , no bowel or bladder incontinence.+ Imbalance   No chest pain, no fever   No dizziness or focal weakness  No headache or change in vision   Physical Exam:       Temp:  [97.3 F (36.3 C)-98.1 F (36.7 C)] 97.3 F (36.3 C)  Heart Rate:  [61-84] 62  Resp Rate:  [16-18] 18  BP: (138-181)/(63-79) 176/68  Intake and Output Summary-last 24 Hrs:  I/O last 3 completed shifts:  In: 200 [P.O.:200]  Out: 4000 [Urine:4000]  No data recorded by O2 Device: None (Room air)    Vitals and nursing note reviewed. Exam conducted with a chaperone present.   Constitutional: Normal appearance, no distress   Mouth: Mucous membranes are moist. + pharyngeal erythema  Eyes: Normal conjunctivae, NIS  Cardiovascular: S1S2 RRR   Pulmonary: b/l diffuse expiratory wheezing, no distress  Abdomen: S/NT/ND/+BS  MSK: chronic b/l LE edema, low back point tenderness  Neurological: Aox3, no gross abnormality,mild tremors and cogwheel rigidity  noted  Psych: normal behavior, normal mood       Meds:   Medications were reviewed:  Scheduled Meds:  Current Facility-Administered Medications   Medication Dose Route Frequency    albuterol-ipratropium  3 mL Nebulization Q6H SCH    aspirin EC  81 mg Oral Daily    atorvastatin  10 mg Oral Daily    budesonide  0.5 mg Nebulization BID    dextrose  250 mL Intravenous Once    enoxaparin  40 mg Subcutaneous Daily    fluticasone  1 spray Each Nare Daily    hydrALAZINE  50 mg Oral BID    insulin lispro  1-4 Units Subcutaneous QHS    insulin lispro  1-8 Units Subcutaneous TID AC    lidocaine  1 patch Transdermal Q24H    methylPREDNISolone  40 mg Intravenous BID    pantoprazole  40 mg Intravenous BID    pregabalin  25 mg Oral BID    ranolazine  500 mg Oral BID    sodium polystyrene  15 g Oral Once    venlafaxine  75 mg Oral QHS     Continuous  Infusions:  PRN Meds:.acetaminophen, albuterol, calcium carbonate, [CANCELED] Nursing communication: Adult Hypoglycemia Treatment Algorithm **AND** glucagon (rDNA) **AND** dextrose **AND** dextrose **AND** dextrose, guaiFENesin-codeine, hydrALAZINE, magnesium sulfate, melatonin, naloxone, potassium & sodium phosphates, potassium chloride **AND** potassium chloride, traMADol  DVT prophylaxis Chemical  Lines:     Patient Lines/Drains/Airways Status       Active PICC Line / CVC Line / PIV Line / Drain / Airway / Intraosseous Line / Epidural Line / ART Line / Line / Wound / Pressure Ulcer / NG/OG Tube       Name Placement date Placement time Site Days    Peripheral IV 01/21/21 20 G Standard Right Antecubital 01/21/21  1422  Antecubital  less than 1    Peripheral IV 01/21/21 20 G Left Upper Arm 01/21/21  1558  Upper Arm  less than 1    External Urinary Catheter 01/22/21  0030  --  less than 1                       Labs/Radiology:   Imaging personally reviewed, including: all available   No results found.  Recent Labs     01/25/21  0611 01/24/21  2033 01/24/21  1557 01/24/21  1147   Whole Blood Glucose POCT 146* 190* 164* 216*       Recent Labs   Lab 01/24/21  0335 01/23/21  0534 01/22/21  2151 01/22/21  0532 01/21/21  1430   Sodium 136 134*  --  132* 133*   Potassium 4.1 4.5 4.2 5.8* 5.4*   Chloride 104 103  --  102 102   BUN 29.0* 29.0*  --  30.0* 32.0*   Creatinine 1.1* 1.1*  --  1.3* 1.4*   EGFR 57.4 57.4  --  47.4 43.5   Glucose 205* 154*  --  193* 89   Calcium 9.6 9.6  --  9.8 9.7       Recent Labs   Lab 01/22/21  0532 01/21/21  1430   WBC 4.74 5.91   Hgb 10.0* 10.0*   Hematocrit 30.5* 30.4*   Platelets 176 191           Recent Labs   Lab 01/22/21  0532 01/21/21  1430   Alkaline Phosphatase 83 84   Bilirubin, Total 0.3 0.3   ALT 27 27   AST (SGOT) 21 22       Recent Labs   Lab 01/24/21  0335 01/21/21  1430   D-Dimer  --  0.83*   Procalcitonin 0.03  --        Results for orders placed or performed during the  hospital encounter of 01/21/21 (from the past 360 hour(s))   COVID-19 (SARS-CoV-2) only ([facility]) asymptomatic admission - Hospitals    Specimen: Nasopharyngeal   Result Value Ref Range    Purpose of COVID testing Screening     SARS-CoV-2 Specimen Source Nasal Swab     SARS CoV 2 Overall Result Not Detected    Respiratory Pathogen Panel w/COVID-19, PCR    Specimen: Nasopharyngeal   Result Value Ref Range    Adenovirus Not Detected     Coronavirus 229E Not Detected     Coronavirus HKU1 Not Detected     Coronavirus NL63 Not Detected     Coronavirus OC43 Not Detected     SARS CoV-2 RNA Not Detected     Human Metapneumovirus Not Detected     Human Rhinovirus/Enterovirus Detected (A)     Influenza A Not Detected     Influenza A/H1 Not Detected     Influenza AH1 - 2009 Not Detected     Influenza A/H3 Not Detected     Influenza B Not Detected     Parainfluenza Virus  1 Not Detected     Parainfluenza Virus 2 Not Detected     Parainfluenza Virus 3 Not Detected     Parainfluenza Virus 4 Not Detected     Respiratory Syncytial Virus Not Detected     Bordetella pertussis Not Detected     Bordetella parapertussis PCR Not Detected     Chlamydophila pneumoniae Not Detected     Mycoplasma pneumoniae Not Detected     Source NP Swab     Test Comment for Respiratory Panel with COVID See Below     Purpose of COVID testing Diagnostic -PUI         Echocardiogram 12/01/2020    Summary    * Left ventricular ejection fraction is normal with an estimated ejection  fraction of 65-70%.    * There is mild concentric left ventricular hypertrophy.    * Left ventricular diastolic filling parameters demonstrate normal diastolic  function.    * Normal right ventricular systolic function.    * There is trace aortic regurgitation.    * No pulmonary hypertension with estimated right ventricular systolic  pressure of  23 mmHg.    * The IVC is normal in size with > 50% respiratory variance consistent with  normal RA pressure of 3 mmHg.     PET myocardial  perfusion imaging 01/27/2020 - [facility]iv[facility]. Small sized area of ischemia in the apical wall. EF 85%, no RWMA     Carotid artery Dopplers 05/11/2020  IMPRESSION:   1. Atherosclerotic disease, bilateral 50-69% stenosis internal carotid arteries.       Signed by: Illene Regulus, MD

## 2021-01-25 NOTE — Plan of Care (Signed)
Problem: Moderate/High Fall Risk Score >5  Goal: Patient will remain free of falls  Outcome: Progressing  Flowsheets  Taken 01/25/2021 0900 by Hassie Bruce, RN  High (Greater than 13): HIGH-Bed alarm on at all times while patient in bed  Taken 01/24/2021 2320 by Colares, Ronn Melena, RN  VH High Risk (Greater than 13):   ALL REQUIRED LOW INTERVENTIONS   ALL REQUIRED MODERATE INTERVENTIONS   A CHAIR PAD ALARM WILL BE USED WHEN PATIENT IS UP SITTING IN A CHAIR   PATIENT IS TO BE SUPERVISED FOR ALL TOILETING ACTIVITIES   A safety companion may be used when deemed appropriate by the Primary RN and Clinical Administrator   Keep door open for better visibility   Include family/significant other in multidisciplinary discussion regarding plan of care as appropriate   Use assistive devices   Use chair-pad alarm device     Problem: Safety  Goal: Patient will be free from injury during hospitalization  Outcome: Progressing  Flowsheets (Taken 01/22/2021 0509 by Elson Clan, RN)  Patient will be free from injury during hospitalization:   Hourly rounding   Ensure appropriate safety devices are available at the bedside   Assess patient's risk for falls and implement fall prevention plan of care per policy   Provide and maintain safe environment   Use appropriate transfer methods  Goal: Patient will be free from infection during hospitalization  Outcome: Progressing  Flowsheets (Taken 01/24/2021 0130 by Erskin Burnet, RN)  Free from Infection during hospitalization:   Assess and monitor for signs and symptoms of infection   Monitor lab/diagnostic results   Monitor all insertion sites (i.e. indwelling lines, tubes, urinary catheters, and drains)   Encourage patient and family to use good hand hygiene technique     Problem: Pain  Goal: Pain at adequate level as identified by patient  Outcome: Progressing  Flowsheets (Taken 01/24/2021 2320 by Erskin Burnet, RN)  Pain at adequate level as identified by patient:   Identify  patient comfort function goal   Assess for risk of opioid induced respiratory depression, including snoring/sleep apnea. Alert healthcare team of risk factors identified.   Assess pain on admission, during daily assessment and/or before any "as needed" intervention(s)   Reassess pain within 30-60 minutes of any procedure/intervention, per Pain Assessment, Intervention, Reassessment (AIR) Cycle   Evaluate if patient comfort function goal is met   Evaluate patient's satisfaction with pain management progress   Offer non-pharmacological pain management interventions   Include patient/patient care companion in decisions related to pain management as needed     Problem: Side Effects from Pain Analgesia  Goal: Patient will experience minimal side effects of analgesic therapy  Outcome: Progressing  Flowsheets (Taken 01/24/2021 1302 by Merri Brunette, RN)  Patient will experience minimal side effects of analgesic therapy:   Monitor/assess patient's respiratory status (RR depth, effort, breath sounds)   Assess for changes in cognitive function     Problem: Discharge Barriers  Goal: Patient will be discharged home or other facility with appropriate resources  Outcome: Progressing  Flowsheets (Taken 01/23/2021 1127 by Merri Brunette, RN)  Discharge to home or other facility with appropriate resources:   Provide appropriate patient education   Initiate discharge planning   Provide information on available health resources     Problem: Psychosocial and Spiritual Needs  Goal: Demonstrates ability to cope with hospitalization/illness  Outcome: Progressing  Flowsheets (Taken 01/24/2021 0130 by Erskin Burnet, RN)  Demonstrates ability to cope with  hospitalizations/illness:   Encourage verbalization of feelings/concerns/expectations   Provide quiet environment   Assist patient to identify own strengths and abilities   Encourage patient to set small goals for self   Encourage participation in diversional activity    Reinforce positive adaptation of new coping behaviors   Include patient/ patient care companion in decisions   Communicate referral to spiritual care as appropriate     Problem: Compromised Tissue integrity  Goal: Damaged tissue is healing and protected  Outcome: Progressing  Flowsheets (Taken 01/23/2021 1127 by Merri Brunette, RN)  Damaged tissue is healing and protected: Monitor/assess Braden scale every shift  Goal: Nutritional status is improving  Outcome: Progressing  Flowsheets (Taken 01/24/2021 2320 by Erskin Burnet, RN)  Nutritional status is improving:   Assist patient with eating   Allow adequate time for meals   Encourage patient to take dietary supplement(s) as ordered   Include patient/patient care companion in decisions related to nutrition   Collaborate with Clinical Nutritionist

## 2021-01-25 NOTE — Progress Notes (Signed)
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  No new complaints    Review of Systems:   No headache, eye, ear nose, throat problems; no coughing or wheezing or shortness of breath,   No chest pain or orthopnea, no abdominal pain, nausea or vomiting,   No pain in the body or extremities, no psychiatric, neurological, endocrine, hematological   or cardiac complaints except as noted above.          Physical Exam:   BP 160/81   Pulse 70   Temp 97.9 F (36.6 C) (Oral)   Resp 16   Ht 1.753 m (5' 9.02")   Wt 83.4 kg (183 lb 14.4 oz)   SpO2 100%   BMI 27.14 kg/m     Neuro:  Level of consciousness: Awake alert oriented x3 significant head tremor noted, moving both arms and legs well gait assessed, with the help of physical therapy at her side, patient was able to ambulate fairly well, turned around and about 6-7 steps, but did not fluid manner, part of this was secondary to pain I do not think it was classic parkinsonian  Pull test was significantly positive with significant imbalance    Meds:      Scheduled Meds: PRN Meds:    albuterol-ipratropium, 3 mL, Nebulization, Q6H SCH  aspirin EC, 81 mg, Oral, Daily  atorvastatin, 10 mg, Oral, Daily  budesonide, 0.5 mg, Nebulization,  BID  dextrose, 250 mL, Intravenous, Once  enoxaparin, 40 mg, Subcutaneous, Daily  fluticasone, 1 spray, Each Nare, Daily  hydrALAZINE, 50 mg, Oral, BID  insulin lispro, 1-4 Units, Subcutaneous, QHS  insulin lispro, 1-8 Units, Subcutaneous, TID AC  lidocaine, 1 patch, Transdermal, Q24H  methylPREDNISolone, 40 mg, Intravenous, BID  pantoprazole, 40 mg, Intravenous, BID  pregabalin, 25 mg, Oral, BID  ranolazine, 500 mg, Oral, BID  sodium polystyrene, 15 g, Oral, Once  venlafaxine, 75 mg, Oral, QHS        Continuous Infusions:   acetaminophen, 650 mg, Q6H PRN  albuterol, 2.5 mg, Q4H PRN  calcium carbonate, 1 tablet, Q6H PRN  glucagon (rDNA), 1 mg, PRN   And  dextrose, 250 mL, PRN   And  dextrose, 25 g, PRN   And  dextrose, 25 g, PRN  guaiFENesin-codeine, 5 mL, Q4H PRN  hydrALAZINE, 10 mg, Q6H PRN  magnesium sulfate, 1 g, PRN  melatonin, 3 mg, QHS PRN  naloxone, 0.2 mg, PRN  potassium & sodium phosphates, 2 packet, PRN  potassium chloride, 0-40 mEq, PRN   And  potassium chloride, 10 mEq, PRN  traMADol, 50 mg, Q6H PRN  Labs:     Recent Labs   Lab 01/24/21  0335 01/23/21  0534 01/22/21  2151 01/22/21  0532 01/21/21  1430   Glucose 205* 154*  --  193* 89   BUN 29.0* 29.0*  --  30.0* 32.0*   Creatinine 1.1* 1.1*  --  1.3* 1.4*   Calcium 9.6 9.6  --  9.8 9.7   Sodium 136 134*  --  132* 133*   Potassium 4.1 4.5 4.2 5.8* 5.4*   Chloride 104 103  --  102 102   CO2 22 21  --  24 25   Albumin 3.2* 3.2*  --  3.4* 3.6   Phosphorus 3.1 3.4  --   --   --    Magnesium  --   --   --   --  1.8   AST (SGOT)  --   --   --  21 22   ALT  --   --   --  27 27   Bilirubin, Total  --   --   --  0.3 0.3   Alkaline Phosphatase  --   --   --  83 84     Recent Labs   Lab 01/22/21  0532 01/21/21  1430   WBC 4.74 5.91   Hgb 10.0* 10.0*   Hematocrit 30.5* 30.4*   MCV 99.3* 100.3*   MCH 32.6 33.0   MCHC 32.8 32.9   Platelets 176 191                 Lab Results   Component Value Date    TSH 0.50 01/22/2021               .ll    No results  for input(s): PTT, PT, INR in the last 72 hours.       Radiology Results (24 Hour)       Procedure Component Value Units Date/Time    NM Bone Scan Whole Body [[id number]] Collected: 01/25/21 1414    Order Status: Completed Updated: 01/25/21 1419    Narrative:      NM BONE/JOINT SCAN WHOLE BODY: 01/25/2021 12:48 PM    CLINICAL INDICATION: .[name] Indeterminate sclerotic lesion within the  L5 vertebral body. Severe back pain with tenderness      COMPARISON: CT of lumbar spine 01/21/2021. MRI lumbar spine  12/17/20    TECHNIQUE:      Date of bone scan: 01/25/2021 12:48 PM  Location: [facility]  Scan Region: Whole body    Injected Dose: 26 mCi Tc-20m HDP      FINDINGS:    Bone lesions: There are no osseous lesions suspicious for metastatic  disease.  Other findings: Soft tissue contours are unremarkable. Both kidneys are  visualized.      Impression:          No abnormal uptake seen scintigraphically to correspond to the region of  abnormality seen on magnetic resonance imaging. Management options for  the indeterminant abnormality at L5 with include follow-up imaging in  3-4 months to evaluate stability, comparison with old films if these can  be made available, or tissue sampling.    Laurena Slimmer, MD   01/25/2021 2:17 PM             All brain imaging (MRI, CT) personally reviewed.    Case discussed with: pt              I personally reviewed all of the medications.  Medication  list generated using all available resources.  Elder abuse (physical)  - negative  Advanced care plan - reviewed from chart or in discussion with pt or family        This note was generated by the Epic EMR system/ Dragon speech recognition and may contain inherent errors or omissions not intended by the user. Grammatical errors, random word insertions, deletions and pronoun errors  are occasional consequences of this technology due to software limitations.   Not all errors are caught or corrected. If there are questions or concerns about the content of this note  or information contained within the body of this dictation they should be addressed directly with the author for clarification.      Signed by: Cathe Mons, MD, MD  Spectralink: [id number]      Answering Service: [phone]

## 2021-01-25 NOTE — Consults (Signed)
ENDOCRINE NEW TELEMEDICINE CONSULT    Date Time: 01/25/21 10:26 AM  Patient Name: Vanessa Kelly  Requesting Physician: Illene Regulus, MD  Consulting Physician: Vincente Liberty, MD  Admission Date: 01/21/2021    Primary Care Physician: Marisa Sprinkles, MD    Endocrinology Consultation for: Concern for adrenal insufficiency  Payor: MEDICARE / Plan: MEDICARE PART A AND B / Product Type: Medicare /     Telemedicine Documentation Requirements    Originating site (Patient location): [facility]  Distant site (Provider location): [facility]  Provider and Title: Beverly Sessions, MD  Access (telephone/video):  Consent obtained: YES/NO: Yes   Language, if applicable and if translator was required: English    Total time of encounter was 70 min > 50% time was spent reviewing her chart, updating her orders, and at the bedside counseling & coordination of care as stated in plan below- including discussing work-up and management of adrenal insufficiency, diabetes    Assessment/Recommendations:   Vanessa Kelly is a 80 y.o. female with past medical history of type 2 diabetes, hypertension, hyperlipidemia, carotid artery stenosis, TIA, CAD status post CABG, falls, right knee replacement, and orthostatic hypotension who presented to the hospital on 01/21/2021 with cough and shortness of breath, found to have bronchitis/exacerbation of reactive airway disease as well as AKI, hyponatremia, hyperkalemia now resolved on high-dose steroids. Endocrinology consulted for possible adrenal insufficiency.    1.  Concern for adrenal insufficiency:  Her ACTH stim test is not interpretable as it was done while on high-dose Solu-Medrol  Furthermore, if there was concern for underlying adrenal insufficiency we would be treating it anyway with her current steroid regimen (she still feels orthostatic on high-dose steroids)  No history of chronic steroid use and vitals were stable on presentation.  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  Type 2 diabetes: A1c 5.3%  Continue sliding scale insulin  May continue weekly Trulicity on discharge      The recommendations above are intended to serve as initial guidance for the management of possible adrenal insufficiency. Further adjustment and final recommendations are at the discretion of the primary team. Please page [phone] for re-consultation or if there are any concerns.      Vincente Liberty, MD  [facility] Endocrinology  Epic Chat or pager ID: [phone]   Hours: Monday - Friday 8:00am - 5:00pm  On nights, holidays and weekends please page the endocrine consult pager [phone] for emergencies.     History of Presenting Illness:   Vanessa Kelly is a 80 y.o. female with past medical history of type 2 diabetes, hypertension, hyperlipidemia, carotid artery stenosis, TIA, CAD status post CABG, falls, right knee replacement, and orthostatic hypotension who presented to the hospital on 01/21/2021 with cough and shortness of breath, found to have bronchitis/exacerbation of reactive airway disease as well as AKI, hyponatremia, hyperkalemia now resolved on high-dose steroids. Endocrinology consulted for possible adrenal insufficiency.    Does not appear that the patient takes any steroids chronically.  On the evening of 01/21/2021 she was started on methylprednisolone 40 mg IV twice daily.  Due to her orthostasis and electrolyte abnormalities including mild hyponatremia and hyperkalemia, an ACTH stim test was completed (while  on high-dose steroids).  Noted that she was euglycemic on presentation and her vitals were stable (hypertensive, no tachycardia).  Patient notes dizziness and  falls prior to admission.  Denies palpitations.  States that she still feels orthostatic while on high-dose steroids.  Denies any history of chronic steroid use and last received a steroid injection in her back over a year ago.     Latest Reference Range & Units 01/22/21 05:32 01/23/21 05:34 01/23/21 08:46 01/23/21 09:21   Cortisol Base ug/dL  <1.6     Cortisol 30 Min ug/dL   10.9    Cortisol 60 Min ug/dL    60.4   Average Estimated Glucose mg/dL 540.9      Hemoglobin W1X 4.6 - 5.9 % 5.3        Regarding her diabetes, the patient takes Trulicity once per week.  Currently on medium-dose sliding scale while on high-dose steroids.    Past Medical History:     Past Medical History:   Diagnosis Date    Diabetes mellitus     Gastroesophageal reflux disease     Hyperlipidemia     Hypertension     Seasonal allergic rhinitis        Past Surgical History:   History reviewed. No pertinent surgical history.    Social History:   Patient  reports that she quit smoking about 32 years ago. Her smoking use included cigarettes. She has never used smokeless tobacco. She reports that she does not currently use alcohol. She reports that she does not use drugs.    Family History:   Patient reports family history includes Heart failure in her mother; Myocardial Infarction in her father.     Allergies:     Allergies   Allergen Reactions    Fentanyl Anaphylaxis     Hives    Percolone [Oxycodone]        Medications:     Medications Prior to Admission   Medication Sig    acetaminophen (TYLENOL) 325 MG tablet Take 650 mg by mouth every 6 (six) hours as needed for Pain (or headache)    aspirin EC 81 MG EC tablet Take 81 mg by mouth daily    calcium carbonate (TUMS) 500 MG chewable tablet Chew 1 tablet by mouth every 6 (six) hours as needed for Heartburn    candesartan (ATACAND) 4 MG tablet Take 4 mg by mouth nightly    candesartan (ATACAND) 4 MG tablet Take 4 mg by mouth daily as needed (SBP > 160)    Dulaglutide (Trulicity) 0.75 MG/0.5ML Solution  Pen-injector Inject 0.75 mg into the skin Once each week on Tuesday morning    ergocalciferol (ERGOCALCIFEROL) 1.25 MG (50000 UT) capsule Take 50,000 Units by mouth once a week On Saturday    fexofenadine (ALLEGRA) 180 MG tablet Take 180 mg by mouth daily    fluticasone (Flonase Sensimist) 27.5 MCG/SPRAY nasal spray 1 spray by Nasal route daily    furosemide (LASIX) 40 MG tablet Take 40 mg by mouth daily as needed (swollen ankles)    lidocaine (LIDODERM) 5 % Place 1 patch onto the skin every 24 hours Remove & Discard patch within 12 hours or as directed by MD    lovastatin (MEVACOR) 40 MG tablet Take 40 mg by mouth nightly    Multiple Vitamins-Minerals (Centrum Silver 50+Women) Tab Take 1 tablet by mouth daily    pantoprazole (PROTONIX) 40 MG tablet Take 40 mg by mouth every morning before breakfast    pregabalin (LYRICA)  25 MG capsule Take 25 mg by mouth 2 (two) times daily    ranolazine (RANEXA) 500 MG 12 hr tablet Take 500 mg by mouth 2 (two) times daily    traMADol (ULTRAM) 50 MG tablet Take 50 mg by mouth every 8 (eight) hours as needed for Pain    venlafaxine (EFFEXOR-XR) 37.5 MG 24 hr capsule Take 75 mg by mouth nightly    zolpidem (AMBIEN) 5 MG tablet Take 5 mg by mouth nightly       Current Facility-Administered Medications   Medication Dose Route Frequency    albuterol-ipratropium  3 mL Nebulization Q6H SCH    aspirin EC  81 mg Oral Daily    atorvastatin  10 mg Oral Daily    budesonide  0.5 mg Nebulization BID    dextrose  250 mL Intravenous Once    enoxaparin  40 mg Subcutaneous Daily    fluticasone  1 spray Each Nare Daily    hydrALAZINE  50 mg Oral BID    insulin lispro  1-4 Units Subcutaneous QHS    insulin lispro  1-8 Units Subcutaneous TID AC    lidocaine  1 patch Transdermal Q24H    methylPREDNISolone  40 mg Intravenous BID    pantoprazole  40 mg Intravenous BID    pregabalin  25 mg Oral BID    ranolazine  500 mg Oral BID    sodium polystyrene  15 g Oral Once    venlafaxine  75 mg Oral QHS         Available old records reviewed, including: Prior epic notes  Review of Systems:   All other systems were reviewed and are negative except as noted in HPI    Physical Exam:   Temp:  [97.3 F (36.3 C)-98.1 F (36.7 C)] 97.3 F (36.3 C)  Heart Rate:  [61-84] 62  Resp Rate:  [16-18] 18  BP: (138-181)/(63-79) 176/68   Wt Readings from Last 3 Encounters:   01/22/21 83.4 kg (183 lb 14.4 oz)   01/01/21 82 kg (180 lb 12.8 oz)   12/29/20 83.9 kg (185 lb)     Body mass index is 27.14 kg/m.    Intake/Output Summary (Last 24 hours) at 01/25/2021 1026  Last data filed at 01/25/2021 0650  Gross per 24 hour   Intake 200 ml   Output 3100 ml   Net -2900 ml       General: awake, alert, oriented x 3; no acute distress.  HEENT: NCAT, EOMI  Neck: No obvious neck mass  Lungs: On room air, no respiratory distress  Neuro: No gross motor deficits    Labs:     Whole Blood Glucose POCT   Date/Time Value Ref Range Status   01/25/2021 0611 146 (H) 70 - 100 mg/dL Final   16/01/9603 5409 190 (H) 70 - 100 mg/dL Final   81/19/1478 2956 164 (H) 70 - 100 mg/dL Final   21/30/8657 8469 216 (H) 70 - 100 mg/dL Final   62/95/2841 3244 114 (H) 70 - 100 mg/dL Final       Hemoglobin A1C (%)   Date Value   01/22/2021 5.3       No results for input(s): WBC, HGB, HCT, PLT in the last 72 hours.    Recent Labs     01/24/21  0335 01/23/21  0534   Sodium 136 134*   Potassium 4.1 4.5   Chloride 104 103   CO2 22 21   BUN 29.0* 29.0*  Creatinine 1.1* 1.1*   Glucose 205* 154*   Calcium 9.6 9.6   Phosphorus 3.1 3.4       Recent Labs     01/24/21  0335 01/23/21  0534   Albumin 3.2* 3.2*       No results for input(s): PTT, PT, INR in the last 72 hours.    TSH   Date Value Ref Range Status   01/22/2021 0.50 0.35 - 4.94 uIU/mL Final     T4 Free   Date Value Ref Range Status   01/22/2021 0.93 0.69 - 1.48 ng/dL Final         Signed by: Vincente Liberty, MD    cc: Illene Regulus, MD  Pcp, None, MD

## 2021-01-26 ENCOUNTER — Inpatient Hospital Stay: Payer: Medicare Other

## 2021-01-26 LAB — GLUCOSE WHOLE BLOOD - POCT
Whole Blood Glucose POCT: 148 mg/dL — ABNORMAL HIGH (ref 70–100)
Whole Blood Glucose POCT: 168 mg/dL — ABNORMAL HIGH (ref 70–100)
Whole Blood Glucose POCT: 274 mg/dL — ABNORMAL HIGH (ref 70–100)
Whole Blood Glucose POCT: 246 mg/dL — ABNORMAL HIGH (ref 70–100)

## 2021-01-26 MED ORDER — HYDRALAZINE HCL 25 MG PO TABS
25.0000 mg | ORAL_TABLET | Freq: Two times a day (BID) | ORAL | Status: DC
Start: 2021-01-27 — End: 2021-01-30
  Administered 2021-01-27 – 2021-01-30 (×7): 25 mg via ORAL
  Filled 2021-01-26 (×8): qty 1

## 2021-01-26 MED ORDER — GADOTERATE MEGLUMINE 10 MMOL/20ML IV SOLN (CLARISCAN)
0.2000 mL/kg | Freq: Once | INTRAVENOUS | Status: AC | PRN
Start: 2021-01-26 — End: 2021-01-26
  Administered 2021-01-26: 22:00:00 17 mL via INTRAVENOUS

## 2021-01-26 MED ORDER — PREDNISONE 20 MG PO TABS
60.0000 mg | ORAL_TABLET | Freq: Every morning | ORAL | Status: DC
Start: 2021-01-26 — End: 2021-01-30
  Administered 2021-01-26 – 2021-01-30 (×5): 60 mg via ORAL
  Filled 2021-01-26 (×5): qty 3

## 2021-01-26 MED ORDER — METHYLPREDNISOLONE SODIUM SUCC 40 MG IJ SOLR
40.0000 mg | Freq: Once | INTRAMUSCULAR | Status: AC
Start: 2021-01-26 — End: 2021-01-26
  Administered 2021-01-26: 17:00:00 40 mg via INTRAVENOUS
  Filled 2021-01-26: qty 1

## 2021-01-26 NOTE — Plan of Care (Signed)
Pt is AOx4 ; no signs of any distress; satting 98-99% on RA; no SOB noted; no complaint of any chest discomfort; with frequent strong cough; PRN cough med given; BP was running high; SBP=170's; PRN hydralazine given as needed for SBP>160; BP now better; latest BP=126/64; afebrile; SR on the monitor; HR usually on the 70's;  maintained on droplet precaution; pt getting IV steroids and neb treatments as scheduled; will continue to monitor BP and respiratory status.    Problem: Inadequate Gas Exchange  Goal: Adequate oxygenation and improved ventilation  Outcome: Progressing  Flowsheets (Taken 01/26/2021 0243)  Adequate oxygenation and improved ventilation:   Assess lung sounds   Monitor SpO2 and treat as needed   Provide mechanical and oxygen support to facilitate gas exchange   Position for maximum ventilatory efficiency   Teach/reinforce use of incentive spirometer 10 times per hour while awake, cough and deep breath as needed   Plan activities to conserve energy: plan rest periods   Increase activity as tolerated/progressive mobility   Consult/collaborate with Respiratory Therapy     Problem: Diabetes: Glucose Imbalance  Goal: Blood glucose stable at established goal  Outcome: Progressing  Flowsheets (Taken 01/26/2021 0243)  Blood glucose stable at established goal:   Include patient/family in decisions related to nutrition/dietary selections   Assess for hypoglycemia /hyperglycemia   Monitor/assess vital signs   Coordinate medication administration with meals, as indicated   Ensure appropriate diet and assess tolerance     Problem: Hemodynamic Status: Cardiac  Goal: Stable vital signs and fluid balance  Outcome: Progressing  Flowsheets (Taken 01/26/2021 0243)  Stable vital signs and fluid balance:   Monitor/assess vital signs and telemetry per unit protocol   Weigh on admission and record weight daily   Assess signs and symptoms associated with cardiac rhythm changes   Monitor intake/output per unit protocol and/or  LIP order   Monitor lab values     Problem: Safety  Goal: Patient will be free from injury during hospitalization  Outcome: Progressing  Flowsheets (Taken 01/26/2021 0243)  Patient will be free from injury during hospitalization:   Assess patient's risk for falls and implement fall prevention plan of care per policy   Provide and maintain safe environment   Ensure appropriate safety devices are available at the bedside   Include patient/ family/ care giver in decisions related to safety   Hourly rounding  Goal: Patient will be free from infection during hospitalization  Outcome: Progressing  Flowsheets (Taken 01/26/2021 0243)  Free from Infection during hospitalization:   Assess and monitor for signs and symptoms of infection   Monitor lab/diagnostic results   Monitor all insertion sites (i.e. indwelling lines, tubes, urinary catheters, and drains)   Encourage patient and family to use good hand hygiene technique

## 2021-01-26 NOTE — Progress Notes (Signed)
Chart review done, MRI not completed we will follow-up after scan has been done  Cathe Mons, MD

## 2021-01-26 NOTE — Progress Notes (Signed)
MEDICINE PROGRESS NOTE  [facility], DIVISION OF HOSPITALIST MEDICINE   [facility]   [place] Pager: [phone]      Date Time: 01/26/21 9:07 AM  Patient Name: Vanessa Kelly  Attending Physician: Illene Regulus, MD  Today's date: 01/26/2021  Hospital Day: 6    CC: Shortness of breath    Vanessa Kelly is 80 y.o. F with hx of Diabetes mellitus, GERD,Hyperlipidemia, Hypertension, TIA,carotid artery stenosis, CAD s/p CABG, stents, falls, chronic back pain ,OA S/P right TKR, parkinsonism (states she doesn't have it), LE venous insufficiency,orthostatic hypotension who presents with worsening of cough,sob and wheezing x 1 week.Patient had fallen one week PTA when she lost her balance trying to plug something in.Has no sick contact. No chest pain, + chronic LE edema.She started taking tums for heart burn couple of weeks ago. No abdominal pain/N/V/D. 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 Etiology not entirely clear. The patient has no prior history of underlying lung disease.Suspect post viral hyperactive airway since RVP + for human rhinovirus     - flu and covid screen negative  - RVP +Human rhinovirus   - no evidence of pneumonia on chest x-ray.   - CTA  chest large hiatal hernia with partial intrathoracic location of the  Stomach, no acute abnormality or PE  - ddx include a  viral infection with resultant hyperactive airway. Need also to consider with the possibility of gastroesophageal reflux disease (given large hiatal hernia) and resultant bronchospasm. NO eosinophilia   - elevate head of the bed  -Continue neb treatments with DuoNeb q.6 h and prn, IV solumedrol, pulmicort bid  - cont PPI   - change IV steroid to PO     #Recurrent falls  # Imbalance   # Head tremor   - pt was diagnosed with parkinson's but states "I don't have it" and not on meds at this time  - neuro has been asked to assist  - FU on MRI of brain and Cervical spine  - Fall precaution  -  encourage oral hydration  - PT/OT    # Large Hiatal hernia  # GERD  - cont PPI    # Uncontrolled HTN  # AKI - improving cr 1.4->1.3->1.1->1.1  # Hyponatremia 133->132->134->136  # Hyperkalemia improved 5.4->5.8->4.5    - cont current hydralazine 50 mg bid, holding parameters in place  - Cosyntropin stim test in am,   - s/p kayexalate   - hold ARB    # Bilateral Carotid artery disease   # Prior CVA  -  50-69% stenosis internal carotid arteries.  # CAD s/p CABG 2002  - s/p stents in 2006 and 2019   - Echo normal EF  - ECG normal sinus rhythm,bradycardia,inferior lead q wave  - Mild apical ischemia  on PET myocardial fusion scan 01/2020  - not on BB ,off ARB d/t hyperkalemia,AKI  - continue Aspirin, statin       #Anemia of chronic disease  - stable  - check B12, folate normal  - TSH - normal  - follow H/H    #Type II DM  - Takes Trulicity once daily injection 0.75 mg at home  - On low-dose sliding scale  - HbA1c 5.3     # Depression- resume effexor    # Low back pain  - MRI L-spine in Aug - multilevel degenerative disc disease and facet arthropathy with up to  mild spinal canal narrowing and moderate-severe bilateral neuroforaminal  Narrowing,mixed degenerative endplate changes with type I changes at L5 and L3.   - CT shows -  Indeterminate sclerotic lesion within the L5 vertebral body, multilevel degenerative changes  - bone scan -  indeterminate abnormality at L5   - plan for FU imaging in 3-4 months      Interval Event/Review of Systems:   Patient seen and examined. noted improving cough, sob and wheezing . + Low back pain , no bowel or bladder incontinence.+ Imbalance and head tremors   No chest pain, no fever   No dizziness or focal weakness  No headache or change in vision   Physical Exam:       Temp:  [97.3 F (36.3 C)-98.2 F (36.8 C)] 97.5 F (36.4 C)  Heart Rate:  [67-88] 69  Resp Rate:  [16-18] 18  BP: (108-170)/(57-81) 108/57  Intake and Output Summary-last 24 Hrs:  I/O last 3 completed shifts:  In: 822 [P.O.:822]  Out: 3350 [Urine:3350]  No data recorded by O2 Device: None (Room air)    Vitals and nursing note reviewed. Exam conducted with a chaperone present.   Constitutional: Normal appearance, no distress   Mouth: Mucous membranes are moist. + pharyngeal erythema  Eyes: Normal conjunctivae, NIS  Cardiovascular: S1S2 RRR   Pulmonary: b/l diffuse expiratory wheezing improving from prior exam , no distress  Abdomen: S/NT/ND/+BS  MSK: chronic b/l LE edema, low back point tenderness  Neurological: Aox3, no gross abnormality,mild tremors and cogwheel rigidity  noted  Psych: normal behavior, normal mood       Meds:   Medications were reviewed:  Scheduled Meds:  Current Facility-Administered Medications   Medication Dose Route Frequency    albuterol-ipratropium  3 mL Nebulization Q6H SCH    aspirin EC  81 mg Oral Daily    atorvastatin  10 mg Oral Daily    budesonide  0.5 mg Nebulization BID    dextrose  250 mL Intravenous Once    enoxaparin  40 mg Subcutaneous Daily    fluticasone  1 spray Each Nare Daily    hydrALAZINE  50 mg Oral BID    insulin lispro  1-4 Units Subcutaneous QHS    insulin lispro  1-8 Units Subcutaneous TID AC    lidocaine  1 patch Transdermal Q24H    methylPREDNISolone  40 mg Intravenous BID    pantoprazole  40 mg Intravenous BID    pregabalin  25 mg Oral BID    ranolazine  500 mg Oral BID    sodium polystyrene  15  g Oral Once    venlafaxine  75 mg Oral QHS     Continuous Infusions:  PRN Meds:.acetaminophen, albuterol, calcium carbonate, [CANCELED] Nursing communication: Adult Hypoglycemia Treatment Algorithm **AND** glucagon (rDNA) **AND** dextrose **AND** dextrose **AND** dextrose, guaiFENesin-codeine, hydrALAZINE, magnesium sulfate, melatonin, naloxone, potassium &  sodium phosphates, potassium chloride **AND** potassium chloride, traMADol  DVT prophylaxis Chemical        Lines:     Patient Lines/Drains/Airways Status       Active PICC Line / CVC Line / PIV Line / Drain / Airway / Intraosseous Line / Epidural Line / ART Line / Line / Wound / Pressure Ulcer / NG/OG Tube       Name Placement date Placement time Site Days    Peripheral IV 01/21/21 20 G Standard Right Antecubital 01/21/21  1422  Antecubital  less than 1    Peripheral IV 01/21/21 20 G Left Upper Arm 01/21/21  1558  Upper Arm  less than 1    External Urinary Catheter 01/22/21  0030  --  less than 1                       Labs/Radiology:   Imaging personally reviewed, including: all available   NM Bone Scan Whole Body    Result Date: 01/25/2021  No abnormal uptake seen scintigraphically to correspond to the region of abnormality seen on magnetic resonance imaging. Management options for the indeterminant abnormality at L5 with include follow-up imaging in 3-4 months to evaluate stability, comparison with old films if these can be made available, or tissue sampling. Laurena Slimmer, MD  01/25/2021 2:17 PM   Recent Labs     01/26/21  0605 01/25/21  2035 01/25/21  1703 01/25/21  1209   Whole Blood Glucose POCT 148* 206* 180* 238*       Recent Labs   Lab 01/24/21  0335 01/23/21  0534 01/22/21  2151 01/22/21  0532 01/21/21  1430   Sodium 136 134*  --  132* 133*   Potassium 4.1 4.5 4.2 5.8* 5.4*   Chloride 104 103  --  102 102   BUN 29.0* 29.0*  --  30.0* 32.0*   Creatinine 1.1* 1.1*  --  1.3* 1.4*   EGFR 57.4 57.4  --  47.4 43.5   Glucose 205* 154*  --  193* 89   Calcium 9.6  9.6  --  9.8 9.7       Recent Labs   Lab 01/22/21  0532 01/21/21  1430   WBC 4.74 5.91   Hgb 10.0* 10.0*   Hematocrit 30.5* 30.4*   Platelets 176 191           Recent Labs   Lab 01/22/21  0532 01/21/21  1430   Alkaline Phosphatase 83 84   Bilirubin, Total 0.3 0.3   ALT 27 27   AST (SGOT) 21 22       Recent Labs   Lab 01/24/21  0335 01/21/21  1430   D-Dimer  --  0.83*   Procalcitonin 0.03  --        Results for orders placed or performed during the hospital encounter of 01/21/21 (from the past 360 hour(s))   COVID-19 (SARS-CoV-2) only ([facility]) asymptomatic admission - Hospitals    Specimen: Nasopharyngeal   Result Value Ref Range    Purpose of COVID testing Screening     SARS-CoV-2 Specimen Source Nasal Swab     SARS CoV 2 Overall Result Not Detected    Respiratory Pathogen Panel w/COVID-19, PCR    Specimen: Nasopharyngeal   Result Value Ref Range    Adenovirus Not Detected     Coronavirus 229E Not Detected     Coronavirus HKU1 Not Detected     Coronavirus  NL63 Not Detected     Coronavirus OC43 Not Detected     SARS CoV-2 RNA Not Detected     Human Metapneumovirus Not Detected     Human Rhinovirus/Enterovirus Detected (A)     Influenza A Not Detected     Influenza A/H1 Not Detected     Influenza AH1 - 2009 Not Detected     Influenza A/H3 Not Detected     Influenza B Not Detected     Parainfluenza Virus 1 Not Detected     Parainfluenza Virus 2 Not Detected     Parainfluenza Virus 3 Not Detected     Parainfluenza Virus 4 Not Detected     Respiratory Syncytial Virus Not Detected     Bordetella pertussis Not Detected     Bordetella parapertussis PCR Not Detected     Chlamydophila pneumoniae Not Detected     Mycoplasma pneumoniae Not Detected     Source NP Swab     Test Comment for Respiratory Panel with COVID See Below     Purpose of COVID testing Diagnostic -PUI         Echocardiogram 12/01/2020    Summary    * Left ventricular ejection fraction is normal with an estimated ejection  fraction of 65-70%.    * There is  mild concentric left ventricular hypertrophy.    * Left ventricular diastolic filling parameters demonstrate normal diastolic  function.    * Normal right ventricular systolic function.    * There is trace aortic regurgitation.    * No pulmonary hypertension with estimated right ventricular systolic  pressure of  23 mmHg.    * The IVC is normal in size with > 50% respiratory variance consistent with  normal RA pressure of 3 mmHg.     PET myocardial perfusion imaging 01/27/2020 - Equivocal. Small sized area of ischemia in the apical wall. EF 85%, no RWMA     Carotid artery Dopplers 05/11/2020  IMPRESSION:   1. Atherosclerotic disease, bilateral 50-69% stenosis internal carotid arteries.       Signed by: Illene Regulus, MD

## 2021-01-26 NOTE — Progress Notes (Signed)
01/26/21 1130   Output (mL)   Bladder Scan Volume (mL) 709 mL   Post void residual

## 2021-01-26 NOTE — Plan of Care (Signed)
Problem: Moderate/High Fall Risk Score >5  Goal: Patient will remain free of falls  Outcome: Progressing  Flowsheets  Taken 01/26/2021 1000 by Hassie Bruce, RN  High (Greater than 13):   HIGH-Bed alarm on at all times while patient in bed   HIGH-Apply yellow "Fall Risk" arm band  Taken 01/24/2021 2320 by Erskin Burnet, RN  VH High Risk (Greater than 13):   ALL REQUIRED LOW INTERVENTIONS   ALL REQUIRED MODERATE INTERVENTIONS   A CHAIR PAD ALARM WILL BE USED WHEN PATIENT IS UP SITTING IN A CHAIR   PATIENT IS TO BE SUPERVISED FOR ALL TOILETING ACTIVITIES   A safety companion may be used when deemed appropriate by the Primary RN and Clinical Administrator   Keep door open for better visibility   Include family/significant other in multidisciplinary discussion regarding plan of care as appropriate   Use assistive devices   Use chair-pad alarm device     Problem: Safety  Goal: Patient will be free from injury during hospitalization  Outcome: Progressing  Flowsheets (Taken 01/26/2021 0243 by Donaciano Eva, RN)  Patient will be free from injury during hospitalization:   Assess patient's risk for falls and implement fall prevention plan of care per policy   Provide and maintain safe environment   Ensure appropriate safety devices are available at the bedside   Include patient/ family/ care giver in decisions related to safety   Hourly rounding  Goal: Patient will be free from infection during hospitalization  Outcome: Progressing  Flowsheets (Taken 01/26/2021 0243 by Donaciano Eva, RN)  Free from Infection during hospitalization:   Assess and monitor for signs and symptoms of infection   Monitor lab/diagnostic results   Monitor all insertion sites (i.e. indwelling lines, tubes, urinary catheters, and drains)   Encourage patient and family to use good hand hygiene technique     Problem: Pain  Goal: Pain at adequate level as identified by patient  Outcome: Progressing  Flowsheets (Taken 01/24/2021 2320 by  Erskin Burnet, RN)  Pain at adequate level as identified by patient:   Identify patient comfort function goal   Assess for risk of opioid induced respiratory depression, including snoring/sleep apnea. Alert healthcare team of risk factors identified.   Assess pain on admission, during daily assessment and/or before any "as needed" intervention(s)   Reassess pain within 30-60 minutes of any procedure/intervention, per Pain Assessment, Intervention, Reassessment (AIR) Cycle   Evaluate if patient comfort function goal is met   Evaluate patient's satisfaction with pain management progress   Offer non-pharmacological pain management interventions   Include patient/patient care companion in decisions related to pain management as needed     Problem: Side Effects from Pain Analgesia  Goal: Patient will experience minimal side effects of analgesic therapy  Outcome: Progressing  Flowsheets (Taken 01/24/2021 1302 by Merri Brunette, RN)  Patient will experience minimal side effects of analgesic therapy:   Monitor/assess patient's respiratory status (RR depth, effort, breath sounds)   Assess for changes in cognitive function     Problem: Discharge Barriers  Goal: Patient will be discharged home or other facility with appropriate resources  Outcome: Progressing  Flowsheets (Taken 01/23/2021 1127 by Merri Brunette, RN)  Discharge to home or other facility with appropriate resources:   Provide appropriate patient education   Initiate discharge planning   Provide information on available health resources     Problem: Psychosocial and Spiritual Needs  Goal: Demonstrates ability to cope with hospitalization/illness  Outcome: Progressing  Flowsheets (Taken 01/24/2021 0130 by Erskin Burnet, RN)  Demonstrates ability to cope with hospitalizations/illness:   Encourage verbalization of feelings/concerns/expectations   Provide quiet environment   Assist patient to identify own strengths and abilities   Encourage  patient to set small goals for self   Encourage participation in diversional activity   Reinforce positive adaptation of new coping behaviors   Include patient/ patient care companion in decisions   Communicate referral to spiritual care as appropriate     Problem: Compromised Tissue integrity  Goal: Damaged tissue is healing and protected  Outcome: Progressing  Flowsheets (Taken 01/23/2021 1127 by Merri Brunette, RN)  Damaged tissue is healing and protected: Monitor/assess Braden scale every shift  Goal: Nutritional status is improving  Outcome: Progressing  Flowsheets (Taken 01/24/2021 2320 by Erskin Burnet, RN)  Nutritional status is improving:   Assist patient with eating   Allow adequate time for meals   Encourage patient to take dietary supplement(s) as ordered   Include patient/patient care companion in decisions related to nutrition   Collaborate with Clinical Nutritionist     Problem: Inadequate Gas Exchange  Goal: Adequate oxygenation and improved ventilation  Outcome: Progressing  Flowsheets (Taken 01/26/2021 0243 by Donaciano Eva, RN)  Adequate oxygenation and improved ventilation:   Assess lung sounds   Monitor SpO2 and treat as needed   Provide mechanical and oxygen support to facilitate gas exchange   Position for maximum ventilatory efficiency   Teach/reinforce use of incentive spirometer 10 times per hour while awake, cough and deep breath as needed   Plan activities to conserve energy: plan rest periods   Increase activity as tolerated/progressive mobility   Consult/collaborate with Respiratory Therapy     Problem: Diabetes: Glucose Imbalance  Goal: Blood glucose stable at established goal  Outcome: Progressing  Flowsheets (Taken 01/26/2021 0243 by Donaciano Eva, RN)  Blood glucose stable at established goal:   Include patient/family in decisions related to nutrition/dietary selections   Assess for hypoglycemia /hyperglycemia   Monitor/assess vital signs   Coordinate  medication administration with meals, as indicated   Ensure appropriate diet and assess tolerance     Problem: Hemodynamic Status: Cardiac  Goal: Stable vital signs and fluid balance  Outcome: Progressing  Flowsheets (Taken 01/26/2021 0243 by Donaciano Eva, RN)  Stable vital signs and fluid balance:   Monitor/assess vital signs and telemetry per unit protocol   Weigh on admission and record weight daily   Assess signs and symptoms associated with cardiac rhythm changes   Monitor intake/output per unit protocol and/or LIP order   Monitor lab values

## 2021-01-27 ENCOUNTER — Inpatient Hospital Stay: Payer: Medicare Other

## 2021-01-27 LAB — GLUCOSE WHOLE BLOOD - POCT
Whole Blood Glucose POCT: 158 mg/dL — ABNORMAL HIGH (ref 70–100)
Whole Blood Glucose POCT: 148 mg/dL — ABNORMAL HIGH (ref 70–100)
Whole Blood Glucose POCT: 252 mg/dL — ABNORMAL HIGH (ref 70–100)
Whole Blood Glucose POCT: 237 mg/dL — ABNORMAL HIGH (ref 70–100)

## 2021-01-27 MED ORDER — MIDODRINE HCL 5 MG PO TABS
2.5000 mg | ORAL_TABLET | Freq: Every morning | ORAL | Status: DC
Start: 2021-01-27 — End: 2021-01-28
  Administered 2021-01-28: 08:00:00 2.5 mg via ORAL
  Filled 2021-01-27 (×2): qty 1

## 2021-01-27 NOTE — Plan of Care (Signed)
Problem: Safety  Goal: Patient will be free from injury during hospitalization  Flowsheets (Taken 01/27/2021 2116)  Patient will be free from injury during hospitalization:   Assess patient's risk for falls and implement fall prevention plan of care per policy   Provide and maintain safe environment     Problem: Inadequate Gas Exchange  Goal: Adequate oxygenation and improved ventilation  Outcome: Progressing  Flowsheets (Taken 01/27/2021 2116)  Adequate oxygenation and improved ventilation:   Assess lung sounds   Provide mechanical and oxygen support to facilitate gas exchange   Teach/reinforce use of incentive spirometer 10 times per hour while awake, cough and deep breath as needed   Increase activity as tolerated/progressive mobility   Consult/collaborate with Respiratory Therapy     Problem: Diabetes: Glucose Imbalance  Goal: Blood glucose stable at established goal  Outcome: Progressing  Flowsheets (Taken 01/27/2021 2116)  Blood glucose stable at established goal:   Monitor lab values   Assess for hypoglycemia /hyperglycemia     Problem: Contact Isolation  Goal: Prevent transmission of Rotavirus while caring for patient in isolation  Flowsheets (Taken 01/27/2021 2116)  Provided Patient and family education: Verbal education  Strict Hand Hygiene: Yes  Multi-dose Medications stored in plastic bags?: Yes   AXOX4, generalized weakness.  VSS SR RA, moist and strong cough, Robitussin with codeine given.  Conrinue monitoring respiratory condition.   Droplet ISO maintained.

## 2021-01-27 NOTE — Progress Notes (Signed)
Orthostatic     01/27/21 0609 01/27/21 0612 01/27/21 0615   Vitals   Level of Consciousness Alert Alert Alert   Heart Rate 68 72 83   Heart Rate Source Monitor Monitor Monitor   BP 146/77 127/73 98/64   BP Location Right arm Right arm Right arm   BP Method Automatic Automatic Automatic   MAP (mmHg) 100 91 75   Patient Position Lying Sitting Standing   Oxygen Therapy   SpO2 99 % 98 % 96 %   O2 Device None (Room air) None (Room air) None (Room air)

## 2021-01-27 NOTE — PT Progress Note (Signed)
[facility]  [address]  [place] [address]  [phone]    Physical Therapy Treatment    Patient:  Vanessa Kelly        MRN#:  [id number]  Unit:  [facility] INTERMEDIATE CARE        Room/Bed:  MI626/MI626-02    Medical Diagnosis: Shortness of breath [R06.02]  Hiatal hernia [K44.9]  Hyponatremia [E87.1]  Hypoglycemia [E16.2]  Lesion of vertebra [M89.9]  Anemia, unspecified type [D64.9]  Acute bilateral low back pain without sciatica [M54.50]    Time of treatment:  PT Received On: 01/27/21  Start Time: 1352 Stop Time: 1419  Time Calculation (min): 27 min       Treatment #: PT Visit Number: 2/5    Patient's medical condition is appropriate for Physical Therapy intervention at this time.    Interpreter utilized: no, not indicated    Assessment   Pt in bed upon arrival and agreeable to PT session. Pt already had TED hose donned. Pt SBA for supine to sit w/ bed rail assist. Orthostatics taken, see vitals section below. Pt required maxA to don abdominal binder. Pt then able to stand w/ CGA and RW. Pt then ambulated 36' w/ CGA and RW. Pt did require one standing rest break. Pt then able to ambulate 50' more and then requested a seated rest break. Pt returned to sitting w/ minA. Pt took a seated rest break for 1 min,  then able to stand w/ CGA and RW and ambulate 25' back to room. Pt returned to supine w/ SBA. Pt left in bed w/ all needs met and call bell in reach.     PMP - Progressive Mobility Protocol   PMP Activity: Step 7 - Walks out of Room  Distance Walked (ft) (Step 6,7): 75 Feet     Plan   Based on today's performance:  Discharge Recommendation: ALF;Home with home health PT   DME Recommended for Discharge: No additional equipment/DME recommended at this time       Transport Recommendations: wheelchair van/transport    Discharge recommendations are based on patient's progression/regression. Please see most recent note for updated discharge recommendations.    Continue plan of  care.    Interdisciplinary Communication: RN      Subjective   "I feel like a toddler the way I walk"  Patient is agreeable to participation in the therapy session. Nursing clears patient for therapy.  Pain: pt denies pain      Vitals: *with TED hose and abdominal binder on*    01/27/21 1357 01/27/21 1400 01/27/21 1415   Vital Signs   BP 127/70 (!) 104/99 129/74   BP Location Right arm Right arm Right arm   BP Method Automatic Automatic Automatic   MAP (mmHg) 89 81 92   Patient Position Sitting Standing Walking        Objective     Precautions/ Contraindications:   Precautions  Weight Bearing Status: no restrictions  Other Precautions: Falls, monitor BP for orthostatic hypotension    Patient is in bed with  Telemetry, PrimaFit (external female catheter), and Intravenous Access in place.        Functional Mobility:  Supine to Sit: SBA  Sit to Supine: SBA  Sit to Stand: CGA w/ RW  Stand to Sit: minA    Gait:   WB status: no restrictions  Assistive Device: RW  Assist Level: CGA  Distance: 75'x2  Pattern: decreased gait speed, forward flexed posture  Educated the patient to role of physical therapy, plan of care, goals  of therapy, rationale for progressing mobility and safety with mobility and ADLs, energy conservation techniques, and home safety.    RN notified of session outcome and that patient was left in bed with all needs met and equipment intact.   Safety measures include: handoff to nurse/clin tech/ unit secretary completed, bed alarm activated, oriented to call bell and placed within reach, personal items within reach, assistive device positioned out of reach, and bed placed in lowest position.   Mobility and ADL status posted at bedside and within E.M.R.    Goals per Eval/ Re-eval:   Goals  Goal Formulation: With patient  Time for Goal Acheivement: 5 visits  Goals: Select goal  Pt Will Go Supine To Sit: modified independent  Pt Will Perform Sit To Supine: modified independent  Pt Will Transfer Bed/Chair:  with rolling walker, modified independent  Pt Will Ambulate: > 200 feet, with four wheel walker, with rolling walker, modified independent        Therapist PPE during session procedural mask and gloves     Signature:  Lennox Pippins, PT  01/27/2021 2:52 PM   Phone: [phone]     (For scheduling questions, please contact rehab tech [phone])

## 2021-01-27 NOTE — Progress Notes - Peds Hem Onc (Addendum)
[place] [facility]: [facility]      Based on today's performance:  Discharge Recommendation: ALF;Home with home health PT   DME Recommended for Discharge: No additional equipment/DME recommended at this time     Patient resides @ [facility]

## 2021-01-27 NOTE — Progress Notes (Signed)
MEDICINE PROGRESS NOTE  [facility], DIVISION OF HOSPITALIST MEDICINE   [facility]   [place] Pager: [phone]      Date Time: 01/27/21 5:35 PM  Patient Name: Vanessa Kelly  Attending Physician: Charlynn Grimes, MD  Hospital Day: 7    Subjective     CC: Shortness of breath    Interval history/last 24 hours:     Reports dizziness upon standing      Review of Systems:   Review of Systems - Negative except as above in HPI    Assessment:     Active Hospital Problems    Diagnosis    Shortness of breath       Vanessa Kelly is 80 y.o. F with hx of Diabetes mellitus, GERD,Hyperlipidemia, Hypertension, TIA,carotid artery stenosis, CAD s/p CABG, stents, falls, chronic back pain ,OA S/P right TKR, parkinsonism (states she doesn't have it), LE venous insufficiency,orthostatic hypotension who presents with worsening of cough,sob and wheezing x 1 week.Patient had fallen one week PTA when she lost her balance trying to plug something in.Has no sick contact. No chest pain, + chronic LE edema.She started taking tums for heart burn couple of weeks ago. No abdominal pain/N/V/D. 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   - has prior hx of OH and syncope  - + orthostatics SBP 146 lying--> 98 standing (9/28)--consider midodrine 2.5 mg in the AM, she is already wearing compression stocking and abdominal binder  156 lying ->113 standing (9/23)  - Echo normal EF, no significant VHD  - suspect adrenal insufficiency, basal AM cortisol < 1->12.9 a/r 30 min and 18.7 after 60 min  - holding antihypertensives if SBP <140 given significant drop  - endocrine recommended to repeat ACTH stim test 1-2 days after taken off steroids  - will provide OP follow up information  - check Orthostatic vitals Q shift,     # Asthmatic bronchitis. Etiology not entirely clear. The patient has no prior history of underlying lung  disease.Suspect post viral hyperactive airway since RVP + for human rhinovirus     - flu and covid screen negative  - RVP +Human rhinovirus   - no evidence of pneumonia on chest x-ray.   - CTA  chest large hiatal hernia with partial intrathoracic location of the  Stomach, no acute abnormality or PE  - ddx include a viral infection with resultant hyperactive airway. Need also to consider with the possibility of gastroesophageal reflux disease (given large hiatal hernia) and resultant bronchospasm. NO eosinophilia   - elevate head of the bed  -Continue neb treatments with DuoNeb q.6 h and prn, IV solumedrol switched to prednisone , pulmicort bid  - cont PPI   - change IV steroid to PO     #Recurrent falls  # Imbalance   # Head tremor   - pt was diagnosed with parkinson's but states "I don't have it" and not on meds at this time  - neuro has been asked to assist  - MRI of brain and Cervical spine unremarkable, MRI L spine ??   - Fall precaution  - encourage oral hydration  - PT/OT    # Large Hiatal hernia  # GERD  - cont PPI    # Uncontrolled HTN  # AKI - improving cr 1.4->1.3->1.1->1.1  # Hyponatremia 133->132->134->136  # Hyperkalemia improved 5.4->5.8->4.5    - cont  current hydralazine 50 mg bid, holding parameters in place  - Cosyntropin stim test as an out patient after she comes off steroid   - s/p kayexalate   - hold ARB    # Bilateral Carotid artery disease   # Prior CVA  -  50-69% stenosis internal carotid arteries.  # CAD s/p CABG 2002  - s/p stents in 2006 and 2019   - Echo normal EF  - ECG normal sinus rhythm,bradycardia,inferior lead q wave  - Mild apical ischemia on PET myocardial fusion scan 01/2020  - not on BB ,off ARB d/t hyperkalemia,AKI  - continue Aspirin, statin       #Anemia of chronic disease  - stable  - check B12, folate normal  - TSH - normal  - follow H/H    #Type II DM  - Takes Trulicity once daily injection 0.75 mg at home  - On low-dose sliding scale  - HbA1c 5.3     # Depression- resume  effexor    # Low back pain  - MRI L-spine in Aug - multilevel degenerative disc disease and facet arthropathy with up to  mild spinal canal narrowing and moderate-severe bilateral neuroforaminal  Narrowing,mixed degenerative endplate changes with type I changes at L5 and L3.   - CT shows -  Indeterminate sclerotic lesion within the L5 vertebral body, multilevel degenerative changes  - bone scan - indeterminate abnormality at L5   - plan for FU imaging in 3-4 months      Lab Results   Component Value Date    CREAT 1.1 (H) 01/24/2021    HGB 10.0 (L) 01/22/2021    NA 136 01/24/2021    GLU 205 (H) 01/24/2021        DVT Prophylaxis: Lovenox    Code Status: Full code    Case discussed with: Patient, staff    Meds:   Medications were reviewed:  Scheduled Meds:  Current Facility-Administered Medications   Medication Dose Route Frequency    albuterol-ipratropium  3 mL Nebulization Q6H SCH    aspirin EC  81 mg Oral Daily    atorvastatin  10 mg Oral Daily    budesonide  0.5 mg Nebulization BID    dextrose  250 mL Intravenous Once    enoxaparin  40 mg Subcutaneous Daily    fluticasone  1 spray Each Nare Daily    hydrALAZINE  25 mg Oral BID    insulin lispro  1-4 Units Subcutaneous QHS    insulin lispro  1-8 Units Subcutaneous TID AC    lidocaine  1 patch Transdermal Q24H    pantoprazole  40 mg Intravenous BID    predniSONE  60 mg Oral QAM W/BREAKFAST    pregabalin  25 mg Oral BID    ranolazine  500 mg Oral BID    sodium polystyrene  15 g Oral Once    venlafaxine  75 mg Oral QHS     Continuous Infusions:  PRN Meds:.acetaminophen, albuterol, calcium carbonate, [CANCELED] Nursing communication: Adult Hypoglycemia Treatment Algorithm **AND** glucagon (rDNA) **AND** dextrose **AND** dextrose **AND** dextrose, guaiFENesin-codeine, hydrALAZINE, magnesium sulfate, melatonin, naloxone, potassium & sodium phosphates, potassium chloride **AND** potassium chloride, traMADol      Labs:     Recent Labs     01/27/21  1623 01/27/21  1145  01/27/21  0612 01/26/21  2105   Whole Blood Glucose POCT 252* 148* 158* 246*       Recent Labs   Lab 01/22/21  0532 01/21/21  1430   WBC 4.74 5.91   Hgb 10.0* 10.0*   Hematocrit 30.5* 30.4*   Platelets 176 191          Recent Labs   Lab 01/24/21  0335 01/23/21  0534   Sodium 136 134*   Potassium 4.1 4.5   Chloride 104 103   CO2 22 21   BUN 29.0* 29.0*   Creatinine 1.1* 1.1*   EGFR 57.4 57.4   Glucose 205* 154*   Calcium 9.6 9.6    Recent Labs   Lab 01/24/21  0335 01/23/21  0534 01/22/21  0532 01/21/21  1430   Alkaline Phosphatase  --   --  83 84   Bilirubin, Total  --   --  0.3 0.3   Protein, Total  --   --  6.8 6.9   Albumin 3.2* 3.2* 3.4* 3.6   ALT  --   --  27 27   AST (SGOT)  --   --  21 22            Physical Exam:     Temp:  [97.2 F (36.2 C)-98.1 F (36.7 C)] 97.7 F (36.5 C)  Heart Rate:  [64-83] 72  Resp Rate:  [16-17] 17  BP: (98-182)/(60-99) 159/62    Intake/Output Summary (Last 24 hours) at 01/27/2021 1735  Last data filed at 01/27/2021 1610  Gross per 24 hour   Intake 336 ml   Output 1450 ml   Net -1114 ml    General: awake, alert ,in no acute distress  Vital signs: reviewed   Cardiovascular: regular rate and rhythm, no murmurs, rubs or gallops  Lungs: Expiratory wheezing  Abdomen: soft, non-tender, non-distended; normoactive bowel sounds  Extremities: no edema  Skin: no rash  Neurological: AOX3           Lines:     Patient Lines/Drains/Airways Status       Active PICC Line / CVC Line / PIV Line / Drain / Airway / Intraosseous Line / Epidural Line / ART Line / Line / Wound / Pressure Ulcer / NG/OG Tube       Name Placement date Placement time Site Days    Peripheral IV 01/21/21 20 G Left Upper Arm 01/21/21  1558  Upper Arm  6    External Urinary Catheter 01/22/21  0030  --  5                     Disposition:     Today's date: 01/27/2021  Length of Stay: 4      Signed by: Charlynn Grimes, MD

## 2021-01-27 NOTE — Plan of Care (Signed)
Problem: Safety  Goal: Patient will be free from injury during hospitalization  Outcome: Progressing  Flowsheets (Taken 01/27/2021 1225)  Patient will be free from injury during hospitalization:   Assess patient's risk for falls and implement fall prevention plan of care per policy   Provide and maintain safe environment   Ensure appropriate safety devices are available at the bedside   Use appropriate transfer methods   Include patient/ family/ care giver in decisions related to safety   Hourly rounding     Problem: Pain  Goal: Pain at adequate level as identified by patient  Outcome: Progressing  Flowsheets (Taken 01/27/2021 1225)  Pain at adequate level as identified by patient:   Assess pain on admission, during daily assessment and/or before any "as needed" intervention(s)   Reassess pain within 30-60 minutes of any procedure/intervention, per Pain Assessment, Intervention, Reassessment (AIR) Cycle   Evaluate if patient comfort function goal is met   Evaluate patient's satisfaction with pain management progress   Include patient/patient care companion in decisions related to pain management as needed     Problem: Discharge Barriers  Goal: Patient will be discharged home or other facility with appropriate resources  Outcome: Progressing  Flowsheets (Taken 01/27/2021 1225)  Discharge to home or other facility with appropriate resources:   Provide appropriate patient education   Provide information on available health resources   Initiate discharge planning     Problem: Inadequate Gas Exchange  Goal: Adequate oxygenation and improved ventilation  Outcome: Progressing  Flowsheets (Taken 01/27/2021 1225)  Adequate oxygenation and improved ventilation:   Assess lung sounds   Monitor SpO2 and treat as needed   Provide mechanical and oxygen support to facilitate gas exchange   Position for maximum ventilatory efficiency   Increase activity as tolerated/progressive mobility     Problem: Hemodynamic Status: Cardiac  Goal:  Stable vital signs and fluid balance  Outcome: Progressing  Flowsheets (Taken 01/27/2021 1225)  Stable vital signs and fluid balance:   Monitor/assess vital signs and telemetry per unit protocol   Assess signs and symptoms associated with cardiac rhythm changes   Monitor lab values   Monitor for leg swelling/edema and report to LIP if abnormal

## 2021-01-27 NOTE — Plan of Care (Signed)
Problem: Safety  Goal: Patient will be free from injury during hospitalization  Outcome: Progressing  Flowsheets (Taken 01/27/2021 0253)  Patient will be free from injury during hospitalization:   Assess patient's risk for falls and implement fall prevention plan of care per policy   Use appropriate transfer methods   Ensure appropriate safety devices are available at the bedside   Hourly rounding   Include patient/ family/ care giver in decisions related to safety     Problem: Pain  Goal: Pain at adequate level as identified by patient  Outcome: Progressing  Flowsheets (Taken 01/27/2021 0253)  Pain at adequate level as identified by patient:   Identify patient comfort function goal   Assess for risk of opioid induced respiratory depression, including snoring/sleep apnea. Alert healthcare team of risk factors identified.   Evaluate if patient comfort function goal is met   Offer non-pharmacological pain management interventions     Problem: Compromised Tissue integrity  Goal: Damaged tissue is healing and protected  Outcome: Progressing  Flowsheets (Taken 01/27/2021 0253)  Damaged tissue is healing and protected:   Monitor/assess Braden scale every shift   Reposition patient every 2 hours and as needed unless able to reposition self   Keep intact skin clean and dry   Use bath wipes, not soap and water, for daily bathing   Monitor patient's hygiene practices     Problem: Inadequate Gas Exchange  Goal: Adequate oxygenation and improved ventilation  Outcome: Progressing  Flowsheets (Taken 01/27/2021 0253)  Adequate oxygenation and improved ventilation:   Assess lung sounds   Monitor SpO2 and treat as needed   Provide mechanical and oxygen support to facilitate gas exchange     Problem: Diabetes: Glucose Imbalance  Goal: Blood glucose stable at established goal  Outcome: Progressing  Flowsheets (Taken 01/27/2021 0253)  Blood glucose stable at established goal:   Monitor lab values   Monitor/assess vital signs   Ensure  patient/family has adequate teaching materials     Problem: Hemodynamic Status: Cardiac  Goal: Stable vital signs and fluid balance  Outcome: Progressing  Flowsheets (Taken 01/27/2021 0253)  Stable vital signs and fluid balance:   Weigh on admission and record weight daily   Monitor/assess vital signs and telemetry per unit protocol   Monitor lab values

## 2021-01-27 NOTE — Progress Notes (Addendum)
Date Time: 01/27/21 12:20 PM  Patient Name: Vanessa Kelly  Attending Physician: Charlynn Grimes, MD  Patient Class: Inpatient  Hospital Day: 4            NEUROLOGY PROGRESS NOTE       Assessment/Plan   Admitted for recurrent falls with an unknown etiology likely mechanical, in nature. 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       Suggest MRI Lumbar spine due to worsening Lumbar Back pain, and Indeterminate abnormality abnormality at L5 on Bone scan  MRI Brain negative   MRI Cervical Spine notable for multilevel spondylosis.   Ordered Right Hip Xray to r/o fracture  B12 1158, folate 15.8, A1c 5.3  Will continue to follow     Discussed with Dr. Francesco Sor      Addendum patient examined with NP, etiology for falls most likely seems multifactorial with ataxia, coming from contribution from peripheral neuropathy patient has chronic tingling numbness in the feet, for years along with some tingling numbness in her hands.  No clear-cut myelopathy noted on imaging studies brain MRI negative as well, patient does have a head tremor, that seems to be alleviated when she lays down hence pointing more towards essential tremor I will discuss with her regarding treatment options if she decides to take it we will start beta-blocker.  Cathe Mons, MD    Subjective:   Patient Seen and Examined. Continues to complain of lumbar back pain with radiation to the right hip and right leg. Pt also c/o of left shin pain today that has been ongoing. States she saw vascular a while back for left leg pain. No tremor noted today.     Review of Systems:   No headache, eye, ear nose, throat problems; no wheezing or shortness of breath,   No chest pain or orthopnea, no abdominal pain, nausea or vomiting,   No psychiatric, neurological, endocrine, hematological   or cardiac complaints except as noted below.    + Fatigue, Cough,  Lumbar Back Pain with radiation to the right hip and leg, Left  Shin leg.         Physical Exam:   BP 142/60   Pulse 68   Temp 97.7 F (36.5 C) (Oral)   Resp 16   Ht 1.753 m (5' 9.02")   Wt 83.4 kg (183 lb 14.4 oz)   SpO2 96%   BMI 27.14 kg/m     Neuro:  Level of consciousness:  Alert and appropriate  Oriented:  X 3  Facial Movements: symmetric  pupils are equal and reactive   Strength:  No upper extremity drift  Sensation to light touch: Intact bilaterally  HEENT: Normocephalic. No icter or congestion  Neck: supple, no lymphadenopathy, no thyromegaly, no JVD  CV: RRR, No murmers, rubs or gallops  Pulm: Clear to auscultation BIL, nonlabored   Abd: soft, non-tender    Meds:      Scheduled Meds: PRN Meds:    albuterol-ipratropium, 3 mL, Nebulization, Q6H SCH  aspirin EC, 81 mg, Oral, Daily  atorvastatin, 10 mg, Oral, Daily  budesonide, 0.5 mg, Nebulization, BID  dextrose, 250 mL, Intravenous, Once  enoxaparin, 40 mg, Subcutaneous, Daily  fluticasone, 1 spray, Each Nare, Daily  hydrALAZINE, 25 mg, Oral, BID  insulin lispro, 1-4 Units, Subcutaneous, QHS  insulin lispro, 1-8 Units, Subcutaneous, TID AC  lidocaine, 1 patch, Transdermal, Q24H  pantoprazole, 40 mg, Intravenous, BID  predniSONE, 60 mg, Oral, QAM  W/BREAKFAST  pregabalin, 25 mg, Oral, BID  ranolazine, 500 mg, Oral, BID  sodium polystyrene, 15 g, Oral, Once  venlafaxine, 75 mg, Oral, QHS        Continuous Infusions:   acetaminophen, 650 mg, Q6H PRN  albuterol, 2.5 mg, Q4H PRN  calcium carbonate, 1 tablet, Q6H PRN  glucagon (rDNA), 1 mg, PRN   And  dextrose, 250 mL, PRN   And  dextrose, 25 g, PRN   And  dextrose, 25 g, PRN  guaiFENesin-codeine, 5 mL, Q4H PRN  hydrALAZINE, 10 mg, Q6H PRN  magnesium sulfate, 1 g, PRN  melatonin, 3 mg, QHS PRN  naloxone, 0.2 mg, PRN  potassium & sodium phosphates, 2 packet, PRN  potassium chloride, 0-40 mEq, PRN   And  potassium chloride, 10 mEq, PRN  traMADol, 50 mg, Q6H PRN          Labs:     Recent Labs   Lab 01/24/21  0335 01/23/21  0534 01/22/21  2151 01/22/21  0532 01/21/21  1430    Glucose 205* 154*  --  193* 89   BUN 29.0* 29.0*  --  30.0* 32.0*   Creatinine 1.1* 1.1*  --  1.3* 1.4*   Calcium 9.6 9.6  --  9.8 9.7   Sodium 136 134*  --  132* 133*   Potassium 4.1 4.5 4.2 5.8* 5.4*   Chloride 104 103  --  102 102   CO2 22 21  --  24 25   Albumin 3.2* 3.2*  --  3.4* 3.6   Phosphorus 3.1 3.4  --   --   --    Magnesium  --   --   --   --  1.8   AST (SGOT)  --   --   --  21 22   ALT  --   --   --  27 27   Bilirubin, Total  --   --   --  0.3 0.3   Alkaline Phosphatase  --   --   --  83 84     Recent Labs   Lab 01/22/21  0532 01/21/21  1430   WBC 4.74 5.91   Hgb 10.0* 10.0*   Hematocrit 30.5* 30.4*   MCV 99.3* 100.3*   MCH 32.6 33.0   MCHC 32.8 32.9   Platelets 176 191                 Lab Results   Component Value Date    TSH 0.50 01/22/2021               .ll    No results for input(s): PTT, PT, INR in the last 72 hours.       Radiology Results (24 Hour)       Procedure Component Value Units Date/Time    MRI Cervical Spine WO Contrast [[id number]] Collected: 01/27/21 0813    Order Status: Completed Updated: 01/27/21 0828    Narrative:      MRI CERVICAL SPINE WO CONTRAST: 01/26/2021 9:48 PM    CLINICAL INFORMATION:  Myelopathy radiculopathy.    COMPARISON:  None.    TECHNIQUE:  Multiplanar MR images of the cervical spine obtained including T1 and  T2-weighted sequences without contrast.    FINDINGS:  Vertebral body height and alignment: Normal vertebral body heights.  Minimal, grade 1 retrolisthesis of C5 on C4 caused by facet arthrosis.  No evidence of acute fracture.    Bone marrow signal: No suspicious marrow  signal. Multilevel endplate  degenerative changes/marrow edema noted.    Intervertebral disc height: Moderate to severe disc height loss from  C4-C5 to C6-C7.    Spinal cord: Normal in thickness and signal intensity.     Paraspinal tissues: The paraspinous soft tissues appear unremarkable.  Paranasal sinus disease and right mastoid effusion noted.    Specific segmental anatomy is as  follows:    C2-3: Central canal and neural foramina appear patent.    C3-4: Posterior disc osteophyte complex and bilateral uncovertebral  joint hypertrophy cause mild central moderate bilateral foraminal  stenosis.    C4-5: Central disc protrusion causes mild central canal stenosis. Patent  foramina.    C5-6: Posterior disc osteophyte complex with ossified posterior  longitudinal ligament and bilateral uncovertebral joint hypertrophy  cause mild canal and, severe right foraminal, and moderate left  foraminal stenosis.     C6-7: Posterior disc osteophyte complex and left greater than right  uncovertebral joint hypertrophy cause mild central canal, severe left  foraminal, and mild right foraminal stenosis.    C7-T1: Central canal and neural foramina appear patent.      Impression:          1. Multilevel spondylosis, as above.    2. No evidence of spinal cord lesion.    Rozann Lesches, MD   01/27/2021 8:26 AM    MRI Brain W WO Contrast [[id number]] Collected: 01/27/21 0803    Order Status: Completed Updated: 01/27/21 0814    Narrative:      MRI BRAIN W WO CONTRAST: 01/26/2021 9:48 PM    CLINICAL INFORMATION:  Ataxia, head tremor.    COMPARISON:  None.    TECHNIQUE:  Multiplanar MR images of the brain obtained including T1 and  T2-weighted sequences without and with IV contrast.     17 ml of Clariscan administered.    FINDINGS:  Gray-white matter differentiation is maintained. There is parenchymal  volume loss secondary to senescent changes. No evidence of infarct,  hemorrhage, herniation, mass lesion, edema, or hydrocephalus. No  suspicious marrow signal. Bilateral cataract surgery noted. Moderate  sized right mastoid effusion present. There is paranasal sinus disease  most notable in the right maxillary and bilateral sphenoid sinuses. No  pathologic postcontrast enhancement.      Impression:          1. No acute intracranial pathology.    2. Paranasal sinus disease.    3. Right mastoid effusion.    Rozann Lesches, MD   01/27/2021  8:12 AM             All brain imaging (MRI, CT) personally reviewed.      I personally reviewed all of the medications.  Medication list generated using all available resources.  Elder abuse (physical)  - negative  Advanced care plan - reviewed from chart or in discussion with pt or family      This note was generated by the Epic EMR system/ Dragon speech recognition and may contain inherent errors or omissions not intended by the user. Grammatical errors, random word insertions, deletions and pronoun errors  are occasional consequences of this technology due to software limitations.   Not all errors are caught or corrected. If there are questions or concerns about the content of this note or information contained within the body of this dictation they should be addressed directly with the author for clarification.      Signed by: Geralynn Rile, NP, MD  Spectralink: [phone]  Answering Service: [phone]

## 2021-01-27 NOTE — Progress Notes (Addendum)
01/27/21 1357 01/27/21 1400 01/27/21 1415   Vital Signs   BP 127/70 (!) 104/99 129/74   BP Location Right arm Right arm Right arm   BP Method Automatic Automatic Automatic   MAP (mmHg) 89 81 92   Patient Position Sitting Standing Walking     Pt had TED hose and abdominal binder on during these vitals.

## 2021-01-28 LAB — GLUCOSE WHOLE BLOOD - POCT
Whole Blood Glucose POCT: 109 mg/dL — ABNORMAL HIGH (ref 70–100)
Whole Blood Glucose POCT: 230 mg/dL — ABNORMAL HIGH (ref 70–100)
Whole Blood Glucose POCT: 343 mg/dL — ABNORMAL HIGH (ref 70–100)
Whole Blood Glucose POCT: 184 mg/dL — ABNORMAL HIGH (ref 70–100)

## 2021-01-28 MED ORDER — MIDODRINE HCL 5 MG PO TABS
5.0000 mg | ORAL_TABLET | Freq: Three times a day (TID) | ORAL | Status: DC
Start: 2021-01-28 — End: 2021-01-29
  Administered 2021-01-28 – 2021-01-29 (×4): 5 mg via ORAL
  Filled 2021-01-28 (×4): qty 1

## 2021-01-28 NOTE — Plan of Care (Addendum)
AA/O X 4. Afebrile. Positive orthostatic BP. Midodrine dose increased to 5 mg.  On compression stocking along with abdominal binder .  States feeling better today. Occasional cough. Robitussin x 2 given. OOB ot chair and stayed in chair for 3-4 hours in no distress. Will continue to monitor.       Problem: Moderate/High Fall Risk Score >5  Goal: Patient will remain free of falls  Outcome: Progressing  Flowsheets (Taken 01/28/2021 1826)  VH High Risk (Greater than 13):   A safety companion may be used when deemed appropriate by the Primary RN and Clinical Administrator   Keep door open for better visibility   Include family/significant other in multidisciplinary discussion regarding plan of care as appropriate   Request PT/OT therapy consult order from physician for patients with gait/mobility impairment   Use assistive devices   Use chair-pad alarm device     Problem: Pain  Goal: Pain at adequate level as identified by patient  Outcome: Progressing  Flowsheets (Taken 01/28/2021 1826)  Pain at adequate level as identified by patient:   Identify patient comfort function goal   Assess for risk of opioid induced respiratory depression, including snoring/sleep apnea. Alert healthcare team of risk factors identified.   Assess pain on admission, during daily assessment and/or before any "as needed" intervention(s)   Reassess pain within 30-60 minutes of any procedure/intervention, per Pain Assessment, Intervention, Reassessment (AIR) Cycle   Evaluate if patient comfort function goal is met     Problem: Inadequate Gas Exchange  Goal: Adequate oxygenation and improved ventilation  Outcome: Progressing  Flowsheets (Taken 01/28/2021 1826)  Adequate oxygenation and improved ventilation:   Assess lung sounds   Provide mechanical and oxygen support to facilitate gas exchange   Teach/reinforce use of incentive spirometer 10 times per hour while awake, cough and deep breath as needed   Monitor SpO2 and treat as needed   Monitor and  treat ETCO2   Position for maximum ventilatory efficiency   Plan activities to conserve energy: plan rest periods   Increase activity as tolerated/progressive mobility   Consult/collaborate with Respiratory Therapy     Problem: Hemodynamic Status: Cardiac  Goal: Stable vital signs and fluid balance  Outcome: Progressing  Flowsheets (Taken 01/28/2021 1826)  Stable vital signs and fluid balance:   Monitor/assess vital signs and telemetry per unit protocol   Assess signs and symptoms associated with cardiac rhythm changes   Monitor lab values   Monitor for leg swelling/edema and report to LIP if abnormal   Weigh on admission and record weight daily   Monitor intake/output per unit protocol and/or LIP order

## 2021-01-28 NOTE — Plan of Care (Signed)
Problem: Safety  Goal: Patient will be free from injury during hospitalization  Outcome: Progressing  Flowsheets (Taken 01/27/2021 2116)  Patient will be free from injury during hospitalization:   Assess patient's risk for falls and implement fall prevention plan of care per policy   Provide and maintain safe environment     Problem: Pain  Goal: Pain at adequate level as identified by patient  Outcome: Progressing  Flowsheets (Taken 01/28/2021 1955)  Pain at adequate level as identified by patient:   Identify patient comfort function goal   Reassess pain within 30-60 minutes of any procedure/intervention, per Pain Assessment, Intervention, Reassessment (AIR) Cycle     Problem: Inadequate Gas Exchange  Goal: Adequate oxygenation and improved ventilation  Outcome: Progressing  Flowsheets (Taken 01/28/2021 1955)  Adequate oxygenation and improved ventilation:   Assess lung sounds   Position for maximum ventilatory efficiency   Provide mechanical and oxygen support to facilitate gas exchange   Consult/collaborate with Respiratory Therapy   Monitor SpO2 and treat as needed     Problem: Contact Isolation  Goal: Prevent transmission of Rotavirus while caring for patient in isolation  Flowsheets (Taken 01/27/2021 2116)  Provided Patient and family education: Verbal education   AXOX4, generalized weakness.  VSS  + orthostatic, SR RA, moist cough, Robitussin given.  No BM for 4 days, needs Laxatives.  Droplet ISO mainrtained.

## 2021-01-28 NOTE — Progress Notes (Signed)
MEDICINE PROGRESS NOTE  [facility], DIVISION OF HOSPITALIST MEDICINE   [facility]   [place] Pager: [phone]      Date Time: 01/28/21 6:54 PM  Patient Name: Vanessa Kelly  Attending Physician: Charlynn Grimes, MD  Hospital Day: 8    Subjective     CC: Shortness of breath    Interval history/last 24 hours:     Reports dizziness upon standing      Review of Systems:   Review of Systems - Negative except as above in HPI    Assessment:     Active Hospital Problems    Diagnosis    Shortness of breath       Vanessa Kelly is 80 y.o. F with hx of Diabetes mellitus, GERD,Hyperlipidemia, Hypertension, TIA,carotid artery stenosis, CAD s/p CABG, stents, falls, chronic back pain ,OA S/P right TKR, parkinsonism (states she doesn't have it), LE venous insufficiency,orthostatic hypotension who presents with worsening of cough,sob and wheezing x 1 week.Patient had fallen one week PTA when she lost her balance trying to plug something in.Has no sick contact. No chest pain, + chronic LE edema.She started taking tums for heart burn couple of weeks ago. No abdominal pain/N/V/D. 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 Etiology not entirely clear. The patient has no prior history of underlying lung  disease.Suspect post viral hyperactive airway since RVP + for human rhinovirus     - flu and covid screen negative  - RVP +Human rhinovirus   - no evidence of pneumonia on chest x-ray.   - CTA  chest large hiatal hernia with partial intrathoracic location of the  Stomach, no acute abnormality or PE  - ddx include a viral infection with resultant hyperactive airway. Need also to consider with the possibility of gastroesophageal reflux disease (given large hiatal hernia) and resultant bronchospasm. NO eosinophilia   - elevate head of the bed  -Continue neb treatments with DuoNeb q.6 h and prn, IV solumedrol switched to prednisone , pulmicort bid  - cont PPI   - change IV steroid to PO     #Recurrent falls  # Imbalance   # Head tremor   - pt was diagnosed with parkinson's but states "I don't have it" and not on meds at this time  - neuro has been asked to assist  - MRI of brain and Cervical spine unremarkable,   - Fall precaution  - encourage oral hydration  - PT/OT    # Large Hiatal hernia  # GERD  - cont PPI    # Uncontrolled HTN  # AKI - improving cr 1.4->1.3->1.1->1.1  # Hyponatremia 133->132->134->136  # Hyperkalemia improved 5.4->5.8->4.5    - cont current hydralazine 50 mg bid, holding parameters  in place  - Cosyntropin stim test as an out patient after she comes off steroid   - s/p kayexalate   - hold ARB    # Bilateral Carotid artery disease   # Prior CVA  -  50-69% stenosis internal carotid arteries.  # CAD s/p CABG 2002  - s/p stents in 2006 and 2019   - Echo normal EF  - ECG normal sinus rhythm,bradycardia,inferior lead q wave  - Mild apical ischemia on PET myocardial fusion scan 01/2020  - not on BB ,off ARB d/t hyperkalemia,AKI  - continue Aspirin, statin       #Anemia of chronic disease  - stable  - check B12, folate normal  - TSH - normal  - follow H/H    #Type II DM  - Takes Trulicity once daily injection 0.75 mg at home  - On low-dose sliding scale  - HbA1c 5.3     # Depression- resume effexor    # Low  back pain  - MRI L-spine in Aug - multilevel degenerative disc disease and facet arthropathy with up to  mild spinal canal narrowing and moderate-severe bilateral neuroforaminal  Narrowing,mixed degenerative endplate changes with type I changes at L5 and L3.   - CT shows -  Indeterminate sclerotic lesion within the L5 vertebral body, multilevel degenerative changes  - bone scan - indeterminate abnormality at L5   - plan for FU imaging in 3-4 months      Lab Results   Component Value Date    CREAT 1.1 (H) 01/24/2021    HGB 10.0 (L) 01/22/2021    NA 136 01/24/2021    GLU 205 (H) 01/24/2021        DVT Prophylaxis: Lovenox    Code Status: Full code    Case discussed with: Patient, staff    Meds:   Medications were reviewed:  Scheduled Meds:  Current Facility-Administered Medications   Medication Dose Route Frequency    albuterol-ipratropium  3 mL Nebulization Q6H SCH    aspirin EC  81 mg Oral Daily    atorvastatin  10 mg Oral Daily    budesonide  0.5 mg Nebulization BID    dextrose  250 mL Intravenous Once    enoxaparin  40 mg Subcutaneous Daily    fluticasone  1 spray Each Nare Daily    hydrALAZINE  25 mg Oral BID    insulin lispro  1-4 Units Subcutaneous QHS    insulin lispro  1-8 Units Subcutaneous TID AC    lidocaine  1 patch Transdermal Q24H    midodrine  5 mg Oral TID MEALS    pantoprazole  40 mg Intravenous BID    predniSONE  60 mg Oral QAM W/BREAKFAST    pregabalin  25 mg Oral BID    ranolazine  500 mg Oral BID    sodium polystyrene  15 g Oral Once    venlafaxine  75 mg Oral QHS     Continuous Infusions:  PRN Meds:.acetaminophen, albuterol, calcium carbonate, [CANCELED] Nursing communication: Adult Hypoglycemia Treatment Algorithm **AND** glucagon (rDNA) **AND** dextrose **AND** dextrose **AND** dextrose, guaiFENesin-codeine, hydrALAZINE, magnesium sulfate, melatonin, naloxone, potassium & sodium phosphates, potassium chloride **AND** potassium chloride, traMADol      Labs:     Recent Labs     01/28/21  1513  01/28/21  1143 01/28/21  0558 01/27/21  2107   Whole Blood Glucose POCT 343* 230* 109* 237*       Recent Labs   Lab 01/22/21  0532   WBC 4.74   Hgb 10.0*   Hematocrit 30.5*   Platelets 176          Recent Labs   Lab 01/24/21  0335 01/23/21  0534   Sodium 136 134*   Potassium 4.1 4.5   Chloride 104 103   CO2 22 21   BUN 29.0* 29.0*   Creatinine 1.1* 1.1*   EGFR 57.4 57.4   Glucose 205* 154*   Calcium 9.6 9.6    Recent Labs   Lab 01/24/21  0335 01/23/21  0534 01/22/21  0532   Alkaline Phosphatase  --   --  83   Bilirubin, Total  --   --  0.3   Protein, Total  --   --  6.8   Albumin 3.2* 3.2* 3.4*   ALT  --   --  27   AST (SGOT)  --   --  21            Physical Exam:     Temp:  [97.5 F (36.4 C)] 97.5 F (36.4 C)  Heart Rate:  [58-89] 77  Resp Rate:  [17-18] 17  BP: (81-176)/(51-91) 128/63    Intake/Output Summary (Last 24 hours) at 01/28/2021 1854  Last data filed at 01/28/2021 1805  Gross per 24 hour   Intake 1000 ml   Output 2250 ml   Net -1250 ml    General: awake, alert ,in no acute distress  Vital signs: reviewed   Cardiovascular: regular rate and rhythm, no murmurs, rubs or gallops  Lungs: Expiratory wheezing  Abdomen: soft, non-tender, non-distended; normoactive bowel sounds  Extremities: no edema  Skin: no rash  Neurological: AOX3           Lines:     Patient Lines/Drains/Airways Status       Active PICC Line / CVC Line / PIV Line / Drain / Airway / Intraosseous Line / Epidural Line / ART Line / Line / Wound / Pressure Ulcer / NG/OG Tube       Name Placement date Placement time Site Days    Peripheral IV 01/21/21 20 G Left Upper Arm 01/21/21  1558  Upper Arm  6    External Urinary Catheter 01/22/21  0030  --  5                     Disposition:     Today's date: 01/28/2021   Length of Stay: 5      Signed by: Charlynn Grimes, MD

## 2021-01-29 LAB — GLUCOSE WHOLE BLOOD - POCT
Whole Blood Glucose POCT: 95 mg/dL (ref 70–100)
Whole Blood Glucose POCT: 97 mg/dL (ref 70–100)
Whole Blood Glucose POCT: 319 mg/dL — ABNORMAL HIGH (ref 70–100)
Whole Blood Glucose POCT: 192 mg/dL — ABNORMAL HIGH (ref 70–100)

## 2021-01-29 MED ORDER — POLYETHYLENE GLYCOL 3350 17 G PO PACK
17.0000 g | PACK | Freq: Every day | ORAL | Status: DC
Start: 2021-01-29 — End: 2021-02-02
  Administered 2021-01-29 – 2021-02-02 (×5): 17 g via ORAL
  Filled 2021-01-29 (×5): qty 1

## 2021-01-29 MED ORDER — MIDODRINE HCL 5 MG PO TABS
7.5000 mg | ORAL_TABLET | Freq: Three times a day (TID) | ORAL | Status: DC
Start: 2021-01-29 — End: 2021-01-31
  Administered 2021-01-29 – 2021-01-31 (×7): 7.5 mg via ORAL
  Filled 2021-01-29 (×7): qty 2

## 2021-01-29 MED ORDER — FLUDROCORTISONE ACETATE 0.1 MG PO TABS
0.1000 mg | ORAL_TABLET | Freq: Every day | ORAL | Status: DC
Start: 2021-01-29 — End: 2021-02-02
  Administered 2021-01-29 – 2021-02-02 (×5): 0.1 mg via ORAL
  Filled 2021-01-29 (×6): qty 1

## 2021-01-29 MED ORDER — DOCUSATE SODIUM 100 MG PO CAPS
100.0000 mg | ORAL_CAPSULE | Freq: Every day | ORAL | Status: DC
Start: 2021-01-29 — End: 2021-02-02
  Administered 2021-01-29 – 2021-02-02 (×5): 100 mg via ORAL
  Filled 2021-01-29 (×5): qty 1

## 2021-01-29 NOTE — Progress Notes (Signed)
MEDICINE PROGRESS NOTE  [facility], DIVISION OF HOSPITALIST MEDICINE   [facility]   [place] Pager: [phone]      Date Time: 01/29/21 2:42 PM  Patient Name: Vanessa Kelly  Attending Physician: Charlynn Grimes, MD  Hospital Day: 9    Subjective     CC: Shortness of breath    Interval history/last 24 hours:     Reports dizziness upon standing      Review of Systems:   Review of Systems - Negative except as above in HPI    Assessment:     Active Hospital Problems    Diagnosis    Shortness of breath       Vanessa Kelly is 80 y.o. F with hx of Diabetes mellitus, GERD,Hyperlipidemia, Hypertension, TIA,carotid artery stenosis, CAD s/p CABG, stents, falls, chronic back pain ,OA S/P right TKR, parkinsonism (states she doesn't have it), LE venous insufficiency,orthostatic hypotension who presents with worsening of cough,sob and wheezing x 1 week.Patient had fallen one week PTA when she lost her balance trying to plug something in.Has no sick contact. No chest pain, + chronic LE edema.She started taking tums for heart burn couple of weeks ago. No abdominal pain/N/V/D. 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   - has prior hx of OH and syncope  - + orthostatics SBP 106 lying--> 79 standing (9/29)--increase midodrine to 7.5 TID, add Florinef 0.1 ,mg daily, she is already wearing compression stocking and abdominal binder  156 lying ->113 standing (9/23)  - Echo normal EF, no significant VHD  - suspect adrenal insufficiency, basal AM cortisol < 1->12.9 a/r 30 min and 18.7 after 60 min  - holding antihypertensives if SBP <140 given significant drop  - endocrine recommended to repeat ACTH stim test 1-2 days after taken off steroids  - will provide OP follow up information  - check Orthostatic vitals Q shift,     # Asthmatic bronchitis. Etiology not entirely clear. The patient has no prior  history of underlying lung disease.Suspect post viral hyperactive airway since RVP + for human rhinovirus     - flu and covid screen negative  - RVP +Human rhinovirus   - no evidence of pneumonia on chest x-ray.   - CTA  chest large hiatal hernia with partial intrathoracic location of the  Stomach, no acute abnormality or PE  - ddx include a viral infection with resultant hyperactive airway. Need also to consider with the possibility of gastroesophageal reflux disease (given large hiatal hernia) and resultant bronchospasm. NO eosinophilia   - elevate head of the bed  -Continue neb treatments with DuoNeb q.6 h and prn, IV solumedrol switched to prednisone , pulmicort bid  - cont PPI   - change IV steroid to PO     #Recurrent falls  # Imbalance   # Head tremor   - pt was diagnosed with parkinson's but states "I don't have it" and not on meds at this time  - neuro has been asked to assist  - MRI of brain and Cervical spine unremarkable,   - Fall precaution  - encourage oral hydration  - PT/OT    # Large Hiatal hernia  # GERD  - cont PPI    # Uncontrolled HTN  # AKI - improving cr 1.4->1.3->1.1->1.1  # Hyponatremia 133->132->134->136  # Hyperkalemia improved 5.4->5.8->4.5    - cont current  hydralazine 50 mg bid, holding parameters in place  - Cosyntropin stim test as an out patient after she comes off steroid   - s/p kayexalate   - hold ARB    # Bilateral Carotid artery disease   # Prior CVA  -  50-69% stenosis internal carotid arteries.  # CAD s/p CABG 2002  - s/p stents in 2006 and 2019   - Echo normal EF  - ECG normal sinus rhythm,bradycardia,inferior lead q wave  - Mild apical ischemia on PET myocardial fusion scan 01/2020  - not on BB ,off ARB d/t hyperkalemia,AKI  - continue Aspirin, statin       #Anemia of chronic disease  - stable  - check B12, folate normal  - TSH - normal  - follow H/H    #Type II DM  - Takes Trulicity once daily injection 0.75 mg at home  - On low-dose sliding scale  - HbA1c 5.3     #  Depression- resume effexor    # Low back pain  - MRI L-spine in Aug - multilevel degenerative disc disease and facet arthropathy with up to  mild spinal canal narrowing and moderate-severe bilateral neuroforaminal  Narrowing,mixed degenerative endplate changes with type I changes at L5 and L3.   - CT shows -  Indeterminate sclerotic lesion within the L5 vertebral body, multilevel degenerative changes  - bone scan - indeterminate abnormality at L5   - plan for FU imaging in 3-4 months      Lab Results   Component Value Date    CREAT 1.1 (H) 01/24/2021    HGB 10.0 (L) 01/22/2021    NA 136 01/24/2021    GLU 205 (H) 01/24/2021        DVT Prophylaxis: Lovenox    Code Status: Full code    Case discussed with: Patient, staff    Meds:   Medications were reviewed:  Scheduled Meds:  Current Facility-Administered Medications   Medication Dose Route Frequency    albuterol-ipratropium  3 mL Nebulization Q6H SCH    aspirin EC  81 mg Oral Daily    atorvastatin  10 mg Oral Daily    budesonide  0.5 mg Nebulization BID    dextrose  250 mL Intravenous Once    docusate sodium  100 mg Oral Daily    enoxaparin  40 mg Subcutaneous Daily    fludrocortisone  0.1 mg Oral Daily    fluticasone  1 spray Each Nare Daily    hydrALAZINE  25 mg Oral BID    insulin lispro  1-4 Units Subcutaneous QHS    insulin lispro  1-8 Units Subcutaneous TID AC    lidocaine  1 patch Transdermal Q24H    midodrine  7.5 mg Oral TID MEALS    pantoprazole  40 mg Intravenous BID    polyethylene glycol  17 g Oral Daily    predniSONE  60 mg Oral QAM W/BREAKFAST    pregabalin  25 mg Oral BID    ranolazine  500 mg Oral BID    sodium polystyrene  15 g Oral Once    venlafaxine  75 mg Oral QHS     Continuous Infusions:  PRN Meds:.acetaminophen, albuterol, calcium carbonate, [CANCELED] Nursing communication: Adult Hypoglycemia Treatment Algorithm **AND** glucagon (rDNA) **AND** dextrose **AND** dextrose **AND** dextrose, guaiFENesin-codeine, hydrALAZINE, magnesium sulfate,  melatonin, naloxone, potassium & sodium phosphates, potassium chloride **AND** potassium chloride, traMADol      Labs:     Recent Labs  01/29/21  1137 01/29/21  0614 01/28/21  2108 01/28/21  1513   Whole Blood Glucose POCT 97 95 184* 343*                  Recent Labs   Lab 01/24/21  0335 01/23/21  0534   Sodium 136 134*   Potassium 4.1 4.5   Chloride 104 103   CO2 22 21   BUN 29.0* 29.0*   Creatinine 1.1* 1.1*   EGFR 57.4 57.4   Glucose 205* 154*   Calcium 9.6 9.6    Recent Labs   Lab 01/24/21  0335 01/23/21  0534   Albumin 3.2* 3.2*            Physical Exam:     Temp:  [97.2 F (36.2 C)-97.5 F (36.4 C)] 97.5 F (36.4 C)  Heart Rate:  [52-77] 65  Resp Rate:  [16-17] 16  BP: (79-186)/(50-79) 100/61    Intake/Output Summary (Last 24 hours) at 01/29/2021 1442  Last data filed at 01/29/2021 1610  Gross per 24 hour   Intake 440 ml   Output 2350 ml   Net -1910 ml    General: awake, alert ,in no acute distress  Vital signs: reviewed   Cardiovascular: regular rate and rhythm, no murmurs, rubs or gallops  Lungs: Expiratory wheezing  Abdomen: soft, non-tender, non-distended; normoactive bowel sounds  Extremities: no edema  Skin: no rash  Neurological: AOX3           Lines:     Patient Lines/Drains/Airways Status       Active PICC Line / CVC Line / PIV Line / Drain / Airway / Intraosseous Line / Epidural Line / ART Line / Line / Wound / Pressure Ulcer / NG/OG Tube       Name Placement date Placement time Site Days    Peripheral IV 01/21/21 20 G Left Upper Arm 01/21/21  1558  Upper Arm  6    External Urinary Catheter 01/22/21  0030  --  5                     Disposition:     Today's date: 01/29/2021  Length of Stay: 6      Signed by: Charlynn Grimes, MD

## 2021-01-29 NOTE — Plan of Care (Signed)
Problem: Safety  Goal: Patient will be free from injury during hospitalization  Outcome: Progressing  Flowsheets (Taken 01/29/2021 1044)  Patient will be free from injury during hospitalization:   Assess patient's risk for falls and implement fall prevention plan of care per policy   Provide and maintain safe environment   Use appropriate transfer methods   Ensure appropriate safety devices are available at the bedside   Include patient/ family/ care giver in decisions related to safety   Hourly rounding  Goal: Patient will be free from infection during hospitalization  Outcome: Progressing  Flowsheets (Taken 01/29/2021 1044)  Free from Infection during hospitalization:   Assess and monitor for signs and symptoms of infection   Monitor lab/diagnostic results   Encourage patient and family to use good hand hygiene technique     Problem: Pain  Goal: Pain at adequate level as identified by patient  Outcome: Progressing  Flowsheets (Taken 01/29/2021 1044)  Pain at adequate level as identified by patient:   Identify patient comfort function goal   Assess for risk of opioid induced respiratory depression, including snoring/sleep apnea. Alert healthcare team of risk factors identified.   Evaluate if patient comfort function goal is met   Evaluate patient's satisfaction with pain management progress   Include patient/patient care companion in decisions related to pain management as needed     Problem: Compromised Tissue integrity  Goal: Damaged tissue is healing and protected  Outcome: Progressing  Flowsheets (Taken 01/29/2021 1044)  Damaged tissue is healing and protected:   Monitor/assess Braden scale every shift   Reposition patient every 2 hours and as needed unless able to reposition self   Increase activity as tolerated/progressive mobility   Avoid shearing injuries   Keep intact skin clean and dry   Monitor external devices/tubes for correct placement to prevent pressure, friction and shearing   Encourage use of  lotion/moisturizer on skin   Monitor patient's hygiene practices  Goal: Nutritional status is improving  Outcome: Progressing  Flowsheets (Taken 01/29/2021 1044)  Nutritional status is improving:   Assist patient with eating   Allow adequate time for meals   Include patient/patient care companion in decisions related to nutrition     Problem: Diabetes: Glucose Imbalance  Goal: Blood glucose stable at established goal  Outcome: Progressing  Flowsheets (Taken 01/29/2021 1044)  Blood glucose stable at established goal:   Monitor lab values   Include patient/family in decisions related to nutrition/dietary selections   Assess for hypoglycemia /hyperglycemia   Monitor/assess vital signs   Ensure appropriate diet and assess tolerance   Ensure adequate hydration   Ensure patient/family has adequate teaching materials     Problem: Hemodynamic Status: Cardiac  Goal: Stable vital signs and fluid balance  Outcome: Progressing  Flowsheets (Taken 01/29/2021 1044)  Stable vital signs and fluid balance:   Monitor/assess vital signs and telemetry per unit protocol   Weigh on admission and record weight daily   Assess signs and symptoms associated with cardiac rhythm changes   Monitor intake/output per unit protocol and/or LIP order   Monitor lab values     Problem: Contact Isolation  Goal: Prevent transmission of Rotavirus while caring for patient in isolation  Outcome: Progressing  Flowsheets  Taken 01/29/2021 1044 by Merrilee Jansky, RN  Is appropriate isolation sign and PPE outside Patient Room?: Yes  Provided Patient and family education: Verbal education  Donning and doffing PPE properly both Staff and Visitors: Yes  Multi-dose Medications stored in plastic bags?: Yes  Taken 01/27/2021 2116 by Charisse Klinefelter, RN  Strict Hand Hygiene: Yes

## 2021-01-30 LAB — GLUCOSE WHOLE BLOOD - POCT
Whole Blood Glucose POCT: 114 mg/dL — ABNORMAL HIGH (ref 70–100)
Whole Blood Glucose POCT: 154 mg/dL — ABNORMAL HIGH (ref 70–100)
Whole Blood Glucose POCT: 333 mg/dL — ABNORMAL HIGH (ref 70–100)
Whole Blood Glucose POCT: 223 mg/dL — ABNORMAL HIGH (ref 70–100)

## 2021-01-30 MED ORDER — INSULIN GLARGINE 100 UNIT/ML SC SOLN
10.0000 [IU] | Freq: Every evening | SUBCUTANEOUS | Status: DC
Start: 2021-01-30 — End: 2021-02-02
  Administered 2021-01-30 – 2021-02-01 (×3): 10 [IU] via SUBCUTANEOUS
  Filled 2021-01-30 (×3): qty 10

## 2021-01-30 MED ORDER — LOSARTAN POTASSIUM 25 MG PO TABS
25.0000 mg | ORAL_TABLET | Freq: Every day | ORAL | Status: DC
Start: 2021-01-31 — End: 2021-02-02
  Administered 2021-01-31 – 2021-02-01 (×2): 25 mg via ORAL
  Filled 2021-01-30 (×3): qty 1

## 2021-01-30 MED ORDER — PREGABALIN 25 MG PO CAPS
25.0000 mg | ORAL_CAPSULE | Freq: Every evening | ORAL | Status: DC
Start: 2021-01-30 — End: 2021-02-02
  Administered 2021-01-30 – 2021-02-01 (×3): 25 mg via ORAL
  Filled 2021-01-30 (×3): qty 1

## 2021-01-30 MED ORDER — INSULIN LISPRO 100 UNIT/ML SOLN (WRAP)
4.0000 [IU] | Freq: Three times a day (TID) | Status: DC
Start: 2021-01-30 — End: 2021-02-02
  Administered 2021-01-31 – 2021-02-02 (×7): 4 [IU] via SUBCUTANEOUS
  Filled 2021-01-30 (×6): qty 12

## 2021-01-30 MED ORDER — PREDNISONE 20 MG PO TABS
40.0000 mg | ORAL_TABLET | Freq: Every morning | ORAL | Status: DC
Start: 2021-01-31 — End: 2021-02-02
  Administered 2021-01-31 – 2021-02-02 (×3): 40 mg via ORAL
  Filled 2021-01-30 (×3): qty 2

## 2021-01-30 NOTE — Plan of Care (Signed)
Problem: Moderate/High Fall Risk Score >5  Goal: Patient will remain free of falls  Outcome: Progressing  Flowsheets (Taken 01/30/2021 0447)  Moderate Risk (6-13): MOD-Apply bed exit alarm if patient is confused  VH High Risk (Greater than 13): BED ALARM WILL BE ACTIVATED WHEN THE PATEINT IS IN BED WITH SIGNAGE "RESET BED ALARM"     Problem: Safety  Goal: Patient will be free from injury during hospitalization  Outcome: Progressing  Flowsheets (Taken 01/30/2021 0447)  Patient will be free from injury during hospitalization: Hourly rounding  Goal: Patient will be free from infection during hospitalization  Outcome: Progressing  Flowsheets (Taken 01/30/2021 0447)  Free from Infection during hospitalization:   Assess and monitor for signs and symptoms of infection   Monitor lab/diagnostic results     Problem: Inadequate Gas Exchange  Goal: Adequate oxygenation and improved ventilation  Outcome: Progressing  Flowsheets (Taken 01/30/2021 0447)  Adequate oxygenation and improved ventilation:   Assess lung sounds   Plan activities to conserve energy: plan rest periods     Problem: Diabetes: Glucose Imbalance  Goal: Blood glucose stable at established goal  Outcome: Progressing  Flowsheets (Taken 01/30/2021 0447)  Blood glucose stable at established goal:   Follow fluid restrictions/IV/PO parameters   Monitor lab values   Monitor intake and output.  Notify LIP if urine output is < 30 mL/hour.   Monitor/assess vital signs   Ensure appropriate diet and assess tolerance     Problem: Hemodynamic Status: Cardiac  Goal: Stable vital signs and fluid balance  Outcome: Progressing  Flowsheets (Taken 01/30/2021 0447)  Stable vital signs and fluid balance:   Monitor/assess vital signs and telemetry per unit protocol   Monitor intake/output per unit protocol and/or LIP order   Assess signs and symptoms associated with cardiac rhythm changes   Monitor lab values   Monitor for leg swelling/edema and report to LIP if abnormal     Problem:  Contact Isolation  Goal: Prevent transmission of Rotavirus while caring for patient in isolation  Outcome: Progressing

## 2021-01-30 NOTE — Progress Notes (Signed)
MEDICINE PROGRESS NOTE  [facility], DIVISION OF HOSPITALIST MEDICINE   [facility]   [place] Pager: [phone]      Date Time: 01/30/21 6:17 PM  Patient Name: Vanessa Kelly  Attending Physician: Thurman Coyer, MD  Hospital Day: 10  Assessment and Plan :     Active Hospital Problems    Diagnosis    Shortness of breath         This is a 80 y.o. female with PMH of  Diabetes mellitus, GERD, hyperlipidemia, hypertension, TIA/carotid artery stenosis, CAD s/p CABG, chronic back pain, osteoarthritis, s/p right TKR, lower extremity venous insufficiency, orthostatic hypotension who presented with cough and shortness of breath and wheezing for the past 3 weeks.  Patient denies fever or chills at home.  Patient also report a fall a day before hospitalization.  In the hospital patient was started on breathing treatment with nebs with some improvement in symptoms.  Patient is also found to have significant orthostatic hypotension, adrenal insufficiency suspected and started on Florinef and midodrine dose increased to 7.5 mg 3 times daily.        #Acute asthmatic bronchitis.  Respiratory viral panel positive for rhinovirus.  Echo showed normal ejection fraction.  Started on nebs treatment and budesonide as well as p.o. prednisone with improvement in symptoms.  Decrease prednisone to 40 mg daily due to hyperglycemia.    # Fall with orthostatic hypotension.  Patient is noted to have significant orthostatic hypotension.  Adrenal insufficiency suspected and started on Florinef.  Midodrine dose increased to 7.5 mg 3 times daily.  Nurses requested to check orthostatic vital signs today, can increase midodrine to 10 mg 3 times daily if needed.  Endocrine consulted with recommendations to follow-up as outpatient to repeat cosyntropin stimulation test when patient is off prednisone.  Continue compression stockings    #Recurrent falls.  Likely multifactorial with known history of chronic back pain, arthritis  and knee replacement as well as orthostatic hypotension.  Neurology evaluated and decreased Lyrica dose.  Continue PT/OT.    #.  Diabetes mellitus type 2.  Blood glucose are elevated likely in setting of steroid use.  I will add Lantus and prandial lispro above.    #.  History of GERD and hiatal hernia.  Continue PPI.    #.  History of hypertension.  Patient is currently on hydralazine.  I will discontinue hydralazine and switch to p.o. losartan in setting of diabetes, also  hydralazine is more likely to  worsen orthostatic hypotension.     #. Chronic low back pain.  MRI lumbar spine in August shows multilevel degenerative disc disease.  Continue as needed pain medication and PT/OT.    #.  Rehab or back to assisted living facility.      DVT Prophylaxis:  -- Continue Enoxaparin 40 mg SC Q24H    Foley/ lines-    I spent 35 minutes on the unit in direct care for this patient  > 50% in counseling and coordination of care for conditions described in my assessment and plan.       Case was discussed with Pt ,RN,       Disposition:     Today's date: 01/30/2021   Length of Stay: 7  Anticipated medical stability for discharge: 1-2 days  Reason for ongoing hospitalization: Improvement in orthostatic hypotension.   Lines:     Patient Lines/Drains/Airways Status       Active PICC Line / CVC Line / PIV Line /  Drain / Airway / Intraosseous Line / Epidural Line / ART Line / Line / Wound / Pressure Ulcer / NG/OG Tube       Name Placement date Placement time Site Days    Peripheral IV 01/21/21 20 G Left Upper Arm 01/21/21  1558  Upper Arm  9    External Urinary Catheter 01/22/21  0030  --  8                     Subjective/ROS/24 hr events:   CC: Shortness of breath  Interval History/24 hour events: No significant Overnight events.   Diet Consistent Carbohydrate and Heart Healthy  Supervise For Meals Frequency: All meals  HPI/Subjective: Today patient reported improvement in shortness of breath however continues to have dry cough.   Orthostatic vital signs are not checked today.  Nurses requested to check orthostatic vitals.  Patient denies abdominal pain, nausea or vomiting.  Tolerating p.o.      Review of Systems:   Review of Systems - Negative except as above in HPI    Physical Exam:     Temp:  [97.3 F (36.3 C)-98.4 F (36.9 C)] 98.4 F (36.9 C)  Heart Rate:  [55-83] 83  Resp Rate:  [18-20] 18  BP: (100-155)/(49-70) 106/57  Intake and Output Summary-last 24 Hrs:  I/O last 3 completed shifts:  In: 320 [P.O.:320]  Out: 3150 [Urine:3150]  No data recorded by O2 Device: None (Room air)       General: WD female  in no acute distress,  Cardiovascular: regular rate and rhythm, no murmurs  Lungs: Mild scattered Rales and faint expiratory wheezing bilaterally.  Abdomen: soft, non-tender, non-distended; normoactive bowel sounds  Extremities: no edema  Neurological: Alert and oriented X 3, moves all extremities.   Skin:no rash or lesion  Foley: Not present  Central line:       Meds:   Medications were reviewed:  Scheduled Meds:  Current Facility-Administered Medications   Medication Dose Route Frequency    albuterol-ipratropium  3 mL Nebulization Q6H SCH    aspirin EC  81 mg Oral Daily    atorvastatin  10 mg Oral Daily    budesonide  0.5 mg Nebulization BID    dextrose  250 mL Intravenous Once    docusate sodium  100 mg Oral Daily    enoxaparin  40 mg Subcutaneous Daily    fludrocortisone  0.1 mg Oral Daily    fluticasone  1 spray Each Nare Daily    hydrALAZINE  25 mg Oral BID    insulin lispro  1-4 Units Subcutaneous QHS    insulin lispro  1-8 Units Subcutaneous TID AC    lidocaine  1 patch Transdermal Q24H    midodrine  7.5 mg Oral TID MEALS    pantoprazole  40 mg Intravenous BID    polyethylene glycol  17 g Oral Daily    predniSONE  60 mg Oral QAM W/BREAKFAST    pregabalin  25 mg Oral QHS    ranolazine  500 mg Oral BID    sodium polystyrene  15 g Oral Once    venlafaxine  75 mg Oral QHS     Continuous Infusions:  PRN Meds:.acetaminophen,  albuterol, calcium carbonate, [CANCELED] Nursing communication: Adult Hypoglycemia Treatment Algorithm **AND** glucagon (rDNA) **AND** dextrose **AND** dextrose **AND** dextrose, guaiFENesin-codeine, hydrALAZINE, magnesium sulfate, melatonin, naloxone, potassium & sodium phosphates, potassium chloride **AND** potassium chloride, traMADol  Labs/Radiology:   Imaging personally reviewed, including: all available  No results found.  Recent Labs     01/30/21  1551 01/30/21  1127 01/30/21  0541 01/29/21  2024   Whole Blood Glucose POCT 333* 154* 114* 192*     Recent Labs   Lab 01/24/21  0335   Sodium 136   Potassium 4.1   Chloride 104   BUN 29.0*   Creatinine 1.1*   EGFR 57.4   Glucose 205*   Calcium 9.6                 Recent Labs   Lab 01/24/21  0335   Procalcitonin 0.03         Signed by: Thurman Coyer, MD

## 2021-01-30 NOTE — Plan of Care (Signed)
Problem: Safety  Goal: Patient will be free from injury during hospitalization  Outcome: Progressing  Flowsheets (Taken 01/30/2021 1150)  Patient will be free from injury during hospitalization:   Provide and maintain safe environment   Assess patient's risk for falls and implement fall prevention plan of care per policy   Use appropriate transfer methods   Ensure appropriate safety devices are available at the bedside  Goal: Patient will be free from infection during hospitalization  Outcome: Progressing  Flowsheets (Taken 01/30/2021 1150)  Free from Infection during hospitalization:   Assess and monitor for signs and symptoms of infection   Monitor lab/diagnostic results   Encourage patient and family to use good hand hygiene technique     Problem: Pain  Goal: Pain at adequate level as identified by patient  Outcome: Progressing  Flowsheets (Taken 01/30/2021 1150)  Pain at adequate level as identified by patient:   Identify patient comfort function goal   Assess for risk of opioid induced respiratory depression, including snoring/sleep apnea. Alert healthcare team of risk factors identified.   Assess pain on admission, during daily assessment and/or before any "as needed" intervention(s)   Include patient/patient care companion in decisions related to pain management as needed     Problem: Compromised Tissue integrity  Goal: Damaged tissue is healing and protected  Outcome: Progressing  Flowsheets (Taken 01/30/2021 1150)  Damaged tissue is healing and protected:   Monitor/assess Braden scale every shift   Reposition patient every 2 hours and as needed unless able to reposition self   Increase activity as tolerated/progressive mobility   Avoid shearing injuries   Keep intact skin clean and dry   Encourage use of lotion/moisturizer on skin   Monitor patient's hygiene practices  Goal: Nutritional status is improving  Outcome: Progressing  Flowsheets (Taken 01/30/2021 1150)  Nutritional status is improving:   Assist  patient with eating   Allow adequate time for meals   Encourage patient to take dietary supplement(s) as ordered   Include patient/patient care companion in decisions related to nutrition     Problem: Diabetes: Glucose Imbalance  Goal: Blood glucose stable at established goal  Outcome: Progressing  Flowsheets (Taken 01/30/2021 1150)  Blood glucose stable at established goal:   Monitor lab values   Monitor intake and output.  Notify LIP if urine output is < 30 mL/hour.   Follow fluid restrictions/IV/PO parameters   Ensure adequate hydration   Ensure appropriate diet and assess tolerance   Ensure patient/family has adequate teaching materials     Problem: Hemodynamic Status: Cardiac  Goal: Stable vital signs and fluid balance  Outcome: Progressing  Flowsheets (Taken 01/30/2021 1150)  Stable vital signs and fluid balance:   Monitor/assess vital signs and telemetry per unit protocol   Weigh on admission and record weight daily   Assess signs and symptoms associated with cardiac rhythm changes   Monitor intake/output per unit protocol and/or LIP order   Monitor lab values   Monitor for leg swelling/edema and report to LIP if abnormal     Problem: Contact Isolation  Goal: Prevent transmission of Rotavirus while caring for patient in isolation  Outcome: Progressing  Flowsheets  Taken 01/30/2021 1150  Is appropriate isolation sign and PPE outside Patient Room?: Yes  Strict Hand Hygiene: Yes  Taken 01/29/2021 1044  Provided Patient and family education: Verbal education

## 2021-01-30 NOTE — Plan of Care (Signed)
Pt is alert and oriented X4  Denies pain, vital signs stable  Cough managed with Robitussin AC as needed  Fall , isolation and safety precaution are in place  Problem: Safety  Goal: Patient will be free from injury during hospitalization  Flowsheets (Taken 01/30/2021 2258)  Patient will be free from injury during hospitalization:   Provide and maintain safe environment   Ensure appropriate safety devices are available at the bedside   Use appropriate transfer methods   Assess patient's risk for falls and implement fall prevention plan of care per policy   Include patient/ family/ care giver in decisions related to safety   Hourly rounding   Assess for patients risk for elopement and implement Elopement Risk Plan per policy     Problem: Pain  Goal: Pain at adequate level as identified by patient  Flowsheets (Taken 01/30/2021 2258)  Pain at adequate level as identified by patient:   Evaluate if patient comfort function goal is met   Reassess pain within 30-60 minutes of any procedure/intervention, per Pain Assessment, Intervention, Reassessment (AIR) Cycle   Assess pain on admission, during daily assessment and/or before any "as needed" intervention(s)   Evaluate patient's satisfaction with pain management progress   Offer non-pharmacological pain management interventions   Consult/collaborate with Pain Service   Consult/collaborate with Physical Therapy, Occupational Therapy, and/or Speech Therapy     Problem: Compromised Tissue integrity  Goal: Damaged tissue is healing and protected  Flowsheets (Taken 01/30/2021 2258)  Damaged tissue is healing and protected:   Monitor external devices/tubes for correct placement to prevent pressure, friction and shearing   Use incontinence wipes for cleaning urine, stool and caustic drainage. Foley care as needed   Use bath wipes, not soap and water, for daily bathing   Relieve pressure to bony prominences for patients at moderate and high risk   Increase activity as  tolerated/progressive mobility   Avoid shearing injuries   Keep intact skin clean and dry   Reposition patient every 2 hours and as needed unless able to reposition self   Monitor/assess Braden scale every shift

## 2021-01-30 NOTE — Progress Notes (Signed)
Case discussed with Dr. Francesco Sor.  Among the reasons for unstable gait is possible side effects from Lyrica.  Will reduce from 25mg  BID to QHS.  Dr. Francesco Sor to follow up next week.

## 2021-01-30 NOTE — Progress Notes (Signed)
CINTIA GLEED MRN: [id number]  80 y.o.  female    NUTRITION:  Reason for assessment: Length of stay  100% po intake  Some edema noted  Discussed food choices with pt    Subjective: I am watching CHO intake, but I want a hamburger so bad.    Assessment   Past Medical History:   Diagnosis Date    Diabetes mellitus     Gastroesophageal reflux disease     Hyperlipidemia     Hypertension     Seasonal allergic rhinitis      Wt Readings from Last 30 Encounters:   01/22/21 83.4 kg (183 lb 14.4 oz)   01/26/21 83.4 kg (183 lb 13.8 oz)   01/01/21 82 kg (180 lb 12.8 oz)   12/29/20 83.9 kg (185 lb)   10/23/20 82.7 kg (182 lb 6.4 oz)     Social History     Socioeconomic History    Marital status: Married   Tobacco Use    Smoking status: Former     Types: Cigarettes     Quit date: 1990     Years since quitting: 32.7    Smokeless tobacco: Never   Vaping Use    Vaping Use: Never used   Substance and Sexual Activity    Alcohol use: Not Currently    Drug use: Never       Active Hospital Problems    Diagnosis    Shortness of breath     Allergies   Allergen Reactions    Fentanyl Anaphylaxis     Hives    Percolone [Oxycodone]      GI Symptoms: None  Skin: edema [resent  NFPE:  Head: no overt s/s subcutaneous muscle or fat loss  Upper Body: no overt s/s subcutaneous muscle or fat loss  Lower Body: edema: mild pitting (1+), indentation lasts 0 to 15 seconds (mild edema -  )    Orders Placed This Encounter   Procedures    Diet Consistent Carbohydrate and Heart Healthy     Current Meds:  albuterol-ipratropium, 3 mL, Q6H SCH  aspirin EC, 81 mg, Daily  atorvastatin, 10 mg, Daily  budesonide, 0.5 mg, BID  dextrose, 250 mL, Once  docusate sodium, 100 mg, Daily  enoxaparin, 40 mg, Daily  fludrocortisone, 0.1 mg, Daily  fluticasone, 1 spray, Daily  hydrALAZINE, 25 mg, BID  insulin lispro, 1-4 Units, QHS  insulin lispro, 1-8 Units, TID AC  lidocaine, 1 patch, Q24H  midodrine, 7.5 mg, TID MEALS  pantoprazole, 40 mg, BID  polyethylene glycol, 17  g, Daily  predniSONE, 60 mg, QAM W/BREAKFAST  pregabalin, 25 mg, BID  ranolazine, 500 mg, BID  sodium polystyrene, 15 g, Once  venlafaxine, 75 mg, QHS        Recent Labs:        Invalid input(s): ADIFF, REFLX, CANCL, BAND, ABAND  Recent Labs   Lab 01/24/21  0335   Sodium 136   Potassium 4.1   Chloride 104   CO2 22   BUN 29.0*   Creatinine 1.1*   Glucose 205*   Calcium 9.6   Phosphorus 3.1   EGFR 57.4     Recent Labs   Lab 01/24/21  0335   Albumin 3.2*       Intake/Output Summary (Last 24 hours) at 01/30/2021 1018  Last data filed at 01/30/2021 0811  Gross per 24 hour   Intake 120 ml   Output 2400 ml   Net -2280 ml  Anthropometrics  Height: 175.3 cm (5' 9.02")  Weight: 83.4 kg (183 lb 14.4 oz)    Estimated Nutrition Needs:  Estimated Energy Needs  Total Energy Estimated Needs: 1800-2200 cal  Method for Estimating Needs: IBW x (25-30) cal    Estimated Protein Needs  Total Protein Estimated Needs: 58-73g  Method for Estimating Needs: IBW x (0.8-1.0)    Estimated Carbohydrate Needs  Total Carbohydrate Estimated Needs: 225-275g  Method for Estimating Needs: 50% of caloric needs    Fluid Needs  Total Fluid Estimated Needs: 1825 ml  Method for Estimating Needs: IBW x 25 ml      Learning & Discharge Planning Needs: None  Religious/Cultural Food Practices: None    Nutrition Diagnosis:   Nutrition diagnosis    Intervention:  Continue nutrition plan    Goals:  Pt maintains 75% or more po intake during LOS    M/E:  Monitor: po intake and labs  Follow up 02/09/21    Lurline Hare MS. RD [address] 1610   01/30/21 @ 1028

## 2021-01-31 LAB — GLUCOSE WHOLE BLOOD - POCT
Whole Blood Glucose POCT: 112 mg/dL — ABNORMAL HIGH (ref 70–100)
Whole Blood Glucose POCT: 218 mg/dL — ABNORMAL HIGH (ref 70–100)
Whole Blood Glucose POCT: 276 mg/dL — ABNORMAL HIGH (ref 70–100)
Whole Blood Glucose POCT: 227 mg/dL — ABNORMAL HIGH (ref 70–100)

## 2021-01-31 MED ORDER — MIDODRINE HCL 5 MG PO TABS
10.0000 mg | ORAL_TABLET | Freq: Three times a day (TID) | ORAL | Status: DC
Start: 2021-02-01 — End: 2021-02-02
  Administered 2021-02-01 – 2021-02-02 (×6): 10 mg via ORAL
  Filled 2021-01-31 (×6): qty 2

## 2021-01-31 NOTE — Progress Notes (Signed)
01/31/21 2020 01/31/21 2022 01/31/21 2024   Vital Signs   BP 116/60 114/69 97/61   BP Location Right arm Right arm Right arm   MAP (mmHg) 79 84 74   Patient Position Lying Sitting Standing     Patient positive for orthostatic BP on night shift.

## 2021-01-31 NOTE — Progress Notes (Signed)
01/31/21 1633 01/31/21 1637 01/31/21 1640   Vital Signs   BP 147/72 154/80 115/69   BP Location Right arm Right arm Right arm   MAP (mmHg) 97 105 84   Patient Position Lying Sitting Standing

## 2021-01-31 NOTE — Plan of Care (Signed)
Problem: Moderate/High Fall Risk Score >5  Goal: Patient will remain free of falls  Outcome: Progressing  Flowsheets (Taken 01/31/2021 2140)  High (Greater than 13):   HIGH-Consider use of low bed   HIGH-Bed alarm on at all times while patient in bed     Problem: Safety  Goal: Patient will be free from injury during hospitalization  Outcome: Progressing  Flowsheets (Taken 01/31/2021 2151)  Patient will be free from injury during hospitalization:   Assess patient's risk for falls and implement fall prevention plan of care per policy   Use appropriate transfer methods   Ensure appropriate safety devices are available at the bedside   Provide and maintain safe environment   Hourly rounding   Include patient/ family/ care giver in decisions related to safety  Goal: Patient will be free from infection during hospitalization  Outcome: Progressing     Problem: Pain  Goal: Pain at adequate level as identified by patient  Outcome: Progressing  Flowsheets (Taken 01/31/2021 2151)  Pain at adequate level as identified by patient:   Identify patient comfort function goal   Assess for risk of opioid induced respiratory depression, including snoring/sleep apnea. Alert healthcare team of risk factors identified.   Assess pain on admission, during daily assessment and/or before any "as needed" intervention(s)   Reassess pain within 30-60 minutes of any procedure/intervention, per Pain Assessment, Intervention, Reassessment (AIR) Cycle   Evaluate if patient comfort function goal is met   Evaluate patient's satisfaction with pain management progress     Problem: Compromised Tissue integrity  Goal: Damaged tissue is healing and protected  Outcome: Progressing  Flowsheets (Taken 01/31/2021 2151)  Damaged tissue is healing and protected:   Monitor/assess Braden scale every shift   Reposition patient every 2 hours and as needed unless able to reposition self   Provide wound care per wound care algorithm   Increase activity as  tolerated/progressive mobility   Relieve pressure to bony prominences for patients at moderate and high risk   Keep intact skin clean and dry   Use bath wipes, not soap and water, for daily bathing   Avoid shearing injuries  Goal: Nutritional status is improving  Outcome: Progressing     Problem: Hemodynamic Status: Cardiac  Goal: Stable vital signs and fluid balance  Outcome: Progressing  Flowsheets (Taken 01/31/2021 2151)  Stable vital signs and fluid balance:   Monitor/assess vital signs and telemetry per unit protocol   Assess signs and symptoms associated with cardiac rhythm changes   Monitor lab values   Monitor for leg swelling/edema and report to LIP if abnormal   Monitor intake/output per unit protocol and/or LIP order   Weigh on admission and record weight daily

## 2021-01-31 NOTE — Progress Notes (Signed)
MEDICINE PROGRESS NOTE  [facility], DIVISION OF HOSPITALIST MEDICINE   [facility]   [place] Pager: [phone]      Date Time: 01/31/21 6:33 PM  Patient Name: Vanessa Kelly  Attending Physician: Thurman Coyer, MD  Hospital Day: 11  Assessment and Plan :     Active Hospital Problems    Diagnosis    Shortness of breath         This is a 80 y.o. female with PMH of  Diabetes mellitus, GERD, hyperlipidemia, hypertension, TIA/carotid artery stenosis, CAD s/p CABG, chronic back pain, osteoarthritis, s/p right TKR, lower extremity venous insufficiency, orthostatic hypotension who presented with cough and shortness of breath and wheezing for the past 3 weeks.  Patient denies fever or chills at home.  Patient also report a fall a day before hospitalization.  In the hospital patient was started on breathing treatment with nebs with some improvement in symptoms.  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       #Acute asthmatic bronchitis.  Respiratory viral panel positive for rhinovirus.  Echo showed normal ejection fraction.  Started on nebs treatment and budesonide as well as p.o. prednisone with improvement in symptoms.  Decrease prednisone to 40 mg yesterday.      # Fall with orthostatic hypotension.  Patient is noted to have significant orthostatic hypotension.  Adrenal insufficiency suspected and started on Florinef.  Orthostatic vitals checked today still shows postural hypotension. Midodrine dose increased to 7.5 mg 3 times daily.   Endocrine consulted with recommendations to follow-up as outpatient to repeat cosyntropin stimulation test when patient is off prednisone.  Continue compression stockings     # Recurrent falls.  Likely multifactorial with known history of chronic back pain, arthritis and knee replacement as well as orthostatic hypotension.   Neurology evaluated and decreased Lyrica dose.  Continue PT/OT.     #.  Diabetes mellitus type 2.  Blood glucose are elevated likely in setting of steroid use.  Increase lantus to 14 units and prandial lispro to 6 units.      #.  History of GERD and hiatal hernia.  Continue PPI.     #.  History of hypertension.  Hydralazine switched to po losartan 25mg  daily due to  known DM and Orthostatic hypotension. Can discontinue Cozaar if  symptomatic orthostatic hypotension persists.      #. Chronic low back pain.  MRI lumbar spine in August shows multilevel degenerative disc disease.  Continue as needed pain medication and PT/OT.     #.  Rehab or back to assisted living facility.        DVT Prophylaxis:  -- Continue Enoxaparin 40 mg SC Q24H    Foley/ lines-    I spent 30 minutes on the unit in direct care for this patient  > 50% in counseling and coordination of care for conditions described in my assessment and plan.       Case was discussed with Pt ,RN,       Disposition:     Today's date: 01/31/2021  Length of Stay: 8  Anticipated medical stability for discharge: 1-2 days pending improvement in Orthostatic hypotension.  Reason for ongoing hospitalization: Symptomatic orthostatic hypotension.  Lines:     Patient Lines/Drains/Airways Status       Active PICC Line / CVC Line / PIV Line / Drain /  Airway / Intraosseous Line / Epidural Line / ART Line / Line / Wound / Pressure Ulcer / NG/OG Tube       Name Placement date Placement time Site Days    Peripheral IV 01/21/21 20 G Left Upper Arm 01/21/21  1558  Upper Arm  10    External Urinary Catheter 01/22/21  0030  --  9                     Subjective/ROS/24 hr events:   CC: Shortness of breath  Interval History/24 hour events: No overnight events.  Diet Consistent Carbohydrate and Heart Healthy  Supervise For Meals Frequency: All meals  HPI/Subjective: Patient is sitting up on the chair.  Orthostatic checks today, continues to show postural hypotension.  Denies chest pain.  He  reported improvement in cough and shortness of breath.      Review of Systems:   Review of Systems - Negative except as above in HPI    Physical Exam:     Temp:  [97.3 F (36.3 C)-97.9 F (36.6 C)] 97.5 F (36.4 C)  Heart Rate:  [53-89] 89  Resp Rate:  [16-17] 16  BP: (101-154)/(52-80) 115/69  Intake and Output Summary-last 24 Hrs:  I/O last 3 completed shifts:  In: 120 [P.O.:120]  Out: 2300 [Urine:2300]  O2 Flow Rate (L/min)  Avg: 5 L/min  Min: 5 L/min  Max: 5 L/min by O2 Device: None (Room air)       General: WD female  in no acute distress,  Cardiovascular: regular rate and rhythm, no murmurs  Lungs: clear to auscultation bilaterally, no additional sounds  Abdomen: soft, non-tender, non-distended; normoactive bowel sounds  Extremities: no edema  Neurological: Alert and oriented X 3, moves all extremities.   Skin:no rash or lesion  Foley:           Meds:   Medications were reviewed:  Scheduled Meds:  Current Facility-Administered Medications   Medication Dose Route Frequency    albuterol-ipratropium  3 mL Nebulization Q6H SCH    aspirin EC  81 mg Oral Daily    atorvastatin  10 mg Oral Daily    budesonide  0.5 mg Nebulization BID    dextrose  250 mL Intravenous Once    docusate sodium  100 mg Oral Daily    enoxaparin  40 mg Subcutaneous Daily    fludrocortisone  0.1 mg Oral Daily    fluticasone  1 spray Each Nare Daily    insulin glargine  10 Units Subcutaneous QHS    insulin lispro  1-4 Units Subcutaneous QHS    insulin lispro  1-8 Units Subcutaneous TID AC    insulin lispro  4 Units Subcutaneous TID AC    lidocaine  1 patch Transdermal Q24H    losartan  25 mg Oral Daily    midodrine  7.5 mg Oral TID MEALS    pantoprazole  40 mg Intravenous BID    polyethylene glycol  17 g Oral Daily    predniSONE  40 mg Oral QAM W/BREAKFAST    pregabalin  25 mg Oral QHS    ranolazine  500 mg Oral BID    sodium polystyrene  15 g Oral Once    venlafaxine  75 mg Oral QHS     Continuous Infusions:  PRN Meds:.acetaminophen,  albuterol, calcium carbonate, [CANCELED] Nursing communication: Adult Hypoglycemia Treatment Algorithm **AND** glucagon (rDNA) **AND** dextrose **AND** dextrose **AND** dextrose, guaiFENesin-codeine, hydrALAZINE, magnesium sulfate, melatonin, naloxone, potassium & sodium  phosphates, potassium chloride **AND** potassium chloride, traMADol  Labs/Radiology:   Imaging personally reviewed, including: all available   No results found.  Recent Labs     01/31/21  1553 01/31/21  1220 01/31/21  0646 01/30/21  2039   Whole Blood Glucose POCT 276* 218* 112* 223*           Invalid input(s): CO                      Signed by: Thurman Coyer, MD

## 2021-01-31 NOTE — Plan of Care (Signed)
Problem: Safety  Goal: Patient will be free from injury during hospitalization  Outcome: Progressing     Problem: Safety  Goal: Patient will be free from infection during hospitalization  Outcome: Progressing     Problem: Pain  Goal: Pain at adequate level as identified by patient  Outcome: Progressing     Problem: Contact Isolation  Goal: Prevent transmission of Rotavirus while caring for patient in isolation  Outcome: Progressing

## 2021-02-01 LAB — BASIC METABOLIC PANEL
Anion Gap: 7 (ref 5.0–15.0)
BUN: 33 mg/dL — ABNORMAL HIGH (ref 7.0–21.0)
CO2: 28 mEq/L (ref 17–29)
Calcium: 9.7 mg/dL (ref 7.9–10.2)
Chloride: 101 mEq/L (ref 99–111)
Creatinine: 1.3 mg/dL — ABNORMAL HIGH (ref 0.4–1.0)
Glucose: 101 mg/dL — ABNORMAL HIGH (ref 70–100)
Potassium: 4.7 mEq/L (ref 3.5–5.3)
Sodium: 136 mEq/L (ref 135–145)

## 2021-02-01 LAB — CBC AND DIFFERENTIAL
Absolute NRBC: 0 10*3/uL (ref 0.00–0.00)
Basophils Absolute Automated: 0.01 10*3/uL (ref 0.00–0.08)
Basophils Automated: 0.1 %
Eosinophils Absolute Automated: 0.09 10*3/uL (ref 0.00–0.44)
Eosinophils Automated: 0.8 %
Hematocrit: 31.9 % — ABNORMAL LOW (ref 34.7–43.7)
Hgb: 10.8 g/dL — ABNORMAL LOW (ref 11.4–14.8)
Immature Granulocytes Absolute: 0.12 10*3/uL — ABNORMAL HIGH (ref 0.00–0.07)
Immature Granulocytes: 1.1 %
Lymphocytes Absolute Automated: 2.94 10*3/uL (ref 0.42–3.22)
Lymphocytes Automated: 27.3 %
MCH: 32.8 pg (ref 25.1–33.5)
MCHC: 33.9 g/dL (ref 31.5–35.8)
MCV: 97 fL — ABNORMAL HIGH (ref 78.0–96.0)
MPV: 10.4 fL (ref 8.9–12.5)
Monocytes Absolute Automated: 0.76 10*3/uL (ref 0.21–0.85)
Monocytes: 7.1 %
Neutrophils Absolute: 6.86 10*3/uL — ABNORMAL HIGH (ref 1.10–6.33)
Neutrophils: 63.6 %
Nucleated RBC: 0 /100 WBC (ref 0.0–0.0)
Platelets: 214 10*3/uL (ref 142–346)
RBC: 3.29 10*6/uL — ABNORMAL LOW (ref 3.90–5.10)
RDW: 13 % (ref 11–15)
WBC: 10.78 10*3/uL — ABNORMAL HIGH (ref 3.10–9.50)

## 2021-02-01 LAB — GLUCOSE WHOLE BLOOD - POCT
Whole Blood Glucose POCT: 96 mg/dL (ref 70–100)
Whole Blood Glucose POCT: 91 mg/dL (ref 70–100)
Whole Blood Glucose POCT: 150 mg/dL — ABNORMAL HIGH (ref 70–100)
Whole Blood Glucose POCT: 261 mg/dL — ABNORMAL HIGH (ref 70–100)

## 2021-02-01 LAB — GFR: EGFR: 47.4

## 2021-02-01 MED ORDER — SENNOSIDES-DOCUSATE SODIUM 8.6-50 MG PO TABS
1.0000 | ORAL_TABLET | Freq: Once | ORAL | Status: AC
Start: 2021-02-02 — End: 2021-02-01
  Administered 2021-02-01: 1 via ORAL
  Filled 2021-02-01: qty 1

## 2021-02-01 MED ORDER — SODIUM CHLORIDE 0.9 % IV SOLN
INTRAVENOUS | Status: AC
Start: 2021-02-01 — End: 2021-02-02

## 2021-02-01 NOTE — Progress Notes (Signed)
MEDICINE PROGRESS NOTE  [facility], DIVISION OF HOSPITALIST MEDICINE   [facility]   [place] Pager: [phone]      Date Time: 02/01/21 4:13 PM  Patient Name: Vanessa Kelly  Attending Physician: Arlyn Dunning, MD  Hospital Day: 12  Assessment and Plan :     Active Hospital Problems    Diagnosis    Shortness of breath            This is a 80 y.o. female with PMH of  Diabetes mellitus, GERD, hyperlipidemia, hypertension, TIA/carotid artery stenosis, CAD s/p CABG, chronic back pain, osteoarthritis, s/p right TKR, lower extremity venous insufficiency, orthostatic hypotension who presented with cough and shortness of breath and wheezing for the past 3 weeks.  Patient denies fever or chills at home.  Patient also report a fall a day before hospitalization.  In the hospital patient was started on breathing treatment with nebs with some improvement in symptoms.  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         #Acute asthmatic bronchitis.  Respiratory viral panel positive for rhinovirus.  Echo showed normal ejection fraction.  Started on nebs treatment and budesonide as well as p.o. prednisone with improvement in symptoms.  Decrease prednisone to 40 mg.      # Fall with orthostatic hypotension.   Midodrine dose increased to 7.5 mg 3 times daily.   Endocrine consulted with recommendations to follow-up as outpatient to repeat cosyntropin stimulation test when patient is off prednisone.  Continue compression stockings.  Appears dehydrated on exam, give 1 L fluid iv slowly over 10 hours.     # Recurrent falls.  Likely multifactorial with known history of chronic back pain, arthritis and knee replacement as well as orthostatic hypotension.  Neurology evaluated and suggested may stop Lyrica .    Continue PT/OT.     #.  Diabetes mellitus type 2.  Blood glucose are  elevated likely in setting of steroid use.    lantus    14 units and prandial lispro to 6 units.      #.  History of GERD and hiatal hernia.  Continue PPI.     #.  History of hypertension.  Hydralazine switched to po losartan 25mg  daily due to  known DM and Orthostatic hypotension. Can discontinue Cozaar if  symptomatic orthostatic hypotension persists.      #. Chronic low back pain.  MRI lumbar spine in August shows multilevel degenerative disc disease.  Continue as needed pain medication and PT/OT.   Suggest MRI Lumbar spine due to worsening Lumbar Back pain, and Indeterminate abnormality abnormality at L5 on Bone scan this can be done as outpatient as well    #.  Rehab or back to assisted living facility.           DVT Prophylaxis:  -- Continue Enoxaparin 40 mg SC Q24H       I spent 26 minutes on the unit in direct care for this patient  > 50% in counseling and coordination of care for conditions described in my assessment and plan.       Case was discussed with Pt ,RN       Disposition:     Today's date: 02/01/2021   Length of Stay: 9   Lines:     Patient Lines/Drains/Airways Status       Active  PICC Line / CVC Line / PIV Line / Drain / Airway / Intraosseous Line / Epidural Line / ART Line / Line / Wound / Pressure Ulcer / NG/OG Tube       Name Placement date Placement time Site Days    Peripheral IV 01/21/21 20 G Left Upper Arm 01/21/21  1558  Upper Arm  11    External Urinary Catheter 01/22/21  0030  --  10                     Subjective/ROS/24 hr events:   CC: Shortness of breath     Diet Consistent Carbohydrate and Heart Healthy  Supervise For Meals Frequency: All meals  HPI/Subjective:     Tried calling daughter, no answer . Left a message to call us back   Patient complains of dizziness while standing. Has been having drops in BP while standing    Review of Systems:   Review of Systems - Negative except as above in HPI    Physical Exam:     Temp:  [97.3 F (36.3 C)-98.1 F (36.7 C)] 97.3 F (36.3  C)  Heart Rate:  [60-89] 60  Resp Rate:  [16-18] 16  BP: (97-154)/(56-80) 128/74  Intake and Output Summary-last 24 Hrs:  I/O last 3 completed shifts:  In: 220 [P.O.:220]  Out: 750 [Urine:750]  No data recorded by O2 Device: None (Room air)         Cardiovascular: regular rate and rhythm, no murmurs  Lungs: clear to auscultation bilaterally, no additional sounds  Abdomen: soft, non-tender, non-distended; normoactive bowel sounds  Extremities: no edema  Neurological: Alert and oriented X 3         Meds:   Medications were reviewed:  Scheduled Meds:  Current Facility-Administered Medications   Medication Dose Route Frequency    albuterol-ipratropium  3 mL Nebulization Q6H SCH    aspirin EC  81 mg Oral Daily    atorvastatin  10 mg Oral Daily    budesonide  0.5 mg Nebulization BID    dextrose  250 mL Intravenous Once    docusate sodium  100 mg Oral Daily    enoxaparin  40 mg Subcutaneous Daily    fludrocortisone  0.1 mg Oral Daily    fluticasone  1 spray Each Nare Daily    insulin glargine  10 Units Subcutaneous QHS    insulin lispro  1-4 Units Subcutaneous QHS    insulin lispro  1-8 Units Subcutaneous TID AC    insulin lispro  4 Units Subcutaneous TID AC    lidocaine  1 patch Transdermal Q24H    losartan  25 mg Oral Daily    midodrine  10 mg Oral TID MEALS    pantoprazole  40 mg Intravenous BID    polyethylene glycol  17 g Oral Daily    predniSONE  40 mg Oral QAM W/BREAKFAST    pregabalin  25 mg Oral QHS    ranolazine  500 mg Oral BID    sodium polystyrene  15 g Oral Once    venlafaxine  75 mg Oral QHS     Continuous Infusions:   sodium chloride       PRN Meds:.acetaminophen, albuterol, calcium carbonate, [CANCELED] Nursing communication: Adult Hypoglycemia Treatment Algorithm **AND** glucagon (rDNA) **AND** dextrose **AND** dextrose **AND** dextrose, guaiFENesin-codeine, hydrALAZINE, magnesium sulfate, melatonin, naloxone, potassium & sodium phosphates, potassium chloride **AND** potassium chloride,  traMADol  Labs/Radiology:   Imaging personally reviewed, including: all available  No results found.  Recent Labs     02/01/21  1604 02/01/21  1148 02/01/21  0817 01/31/21  2119   Whole Blood Glucose POCT 150* 91 96 227*     Recent Labs   Lab 02/01/21  0619   Sodium 136   Potassium 4.7   Chloride 101   BUN 33.0*   Creatinine 1.3*   EGFR 47.4   Glucose 101*   Calcium 9.7     Recent Labs   Lab 02/01/21  0619   WBC 10.78*   Hgb 10.8*   Hematocrit 31.9*   Platelets 214                     Signed by: Arlyn Dunning, MD

## 2021-02-01 NOTE — Plan of Care (Signed)
Pt is A&Ox4. Vital signs are stable. Blood sugars have been stable throughout the day. Only scheduled insulin has been given. She is receiving IVF. Pt and daughter have been updated on plan of care. Pt is expected to discharge tomorrow to a skilled nursing facility.   Problem: Moderate/High Fall Risk Score >5  Goal: Patient will remain free of falls  Outcome: Progressing  Flowsheets (Taken 02/01/2021 0850)  High (Greater than 13):   HIGH-Consider use of low bed   HIGH-Apply yellow "Fall Risk" arm band   HIGH-Utilize chair pad alarm for patient while in the chair     Problem: Diabetes: Glucose Imbalance  Goal: Blood glucose stable at established goal  Outcome: Progressing  Flowsheets (Taken 02/01/2021 1844)  Blood glucose stable at established goal:   Monitor lab values   Monitor intake and output.  Notify LIP if urine output is < 30 mL/hour.   Follow fluid restrictions/IV/PO parameters   Include patient/family in decisions related to nutrition/dietary selections   Assess for hypoglycemia /hyperglycemia   Monitor/assess vital signs   Coordinate medication administration with meals, as indicated   Ensure appropriate diet and assess tolerance   Ensure adequate hydration   Ensure appropriate consults are obtained (Nutrition, Diabetes Education, and Case Management)   Ensure patient/family has adequate teaching materials     Problem: Hemodynamic Status: Cardiac  Goal: Stable vital signs and fluid balance  Outcome: Progressing  Flowsheets (Taken 02/01/2021 1844)  Stable vital signs and fluid balance:   Monitor/assess vital signs and telemetry per unit protocol   Assess signs and symptoms associated with cardiac rhythm changes   Monitor intake/output per unit protocol and/or LIP order   Monitor lab values   Monitor for leg swelling/edema and report to LIP if abnormal

## 2021-02-01 NOTE — Plan of Care (Signed)
Problem: Moderate/High Fall Risk Score >5  Goal: Patient will remain free of falls  Outcome: Progressing  Flowsheets (Taken 02/01/2021 2130)  Moderate Risk (6-13):   MOD-Remain with patient during toileting   MOD-Consider activation of bed alarm if appropriate     Problem: Safety  Goal: Patient will be free from injury during hospitalization  Outcome: Progressing  Flowsheets (Taken 02/01/2021 2351)  Patient will be free from injury during hospitalization:   Assess patient's risk for falls and implement fall prevention plan of care per policy   Provide and maintain safe environment   Hourly rounding   Use appropriate transfer methods     Problem: Pain  Goal: Pain at adequate level as identified by patient  Outcome: Progressing  Flowsheets (Taken 02/01/2021 2351)  Pain at adequate level as identified by patient:   Identify patient comfort function goal   Assess pain on admission, during daily assessment and/or before any "as needed" intervention(s)   Reassess pain within 30-60 minutes of any procedure/intervention, per Pain Assessment, Intervention, Reassessment (AIR) Cycle   Evaluate if patient comfort function goal is met   Offer non-pharmacological pain management interventions     Problem: Inadequate Gas Exchange  Goal: Adequate oxygenation and improved ventilation  Outcome: Progressing  Flowsheets (Taken 02/01/2021 2351)  Adequate oxygenation and improved ventilation:   Assess lung sounds   Monitor SpO2 and treat as needed

## 2021-02-01 NOTE — Plan of Care (Signed)
Complaints of constipation requesting stool softener.     - peri colace    Vitals:    02/01/21 2005   BP: 111/67   Pulse: 68   Resp: 18   Temp: 97.7 F (36.5 C)   SpO2: 98%     - Dorris Fetch, PA-C

## 2021-02-01 NOTE — Progress Notes (Signed)
Date Time: 02/01/21 1:57 PM  Patient Name: Vanessa Kelly  Attending Physician: Arlyn Dunning, MD  Patient Class: Inpatient  Hospital Day: 9            NEUROLOGY PROGRESS NOTE       Assessment/Plan   Admitted for recurrent falls with an unknown etiology likely mechanical, in nature. 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  Orthostatic hypotension could be contributing        Suggest MRI Lumbar spine due to worsening Lumbar Back pain, and Indeterminate abnormality abnormality at L5 on Bone scan this can be done as outpatient as well  MRI Brain negative   MRI Cervical Spine notable for multilevel spondylosis.   B12 1158, folate 15.8, A1c 5.3  May stop Lyrica  We will sign off  Regarding her tremor, will evaluate the patient if she feels it needs to be treated she can follow-up as outpatient given the tremor resolves when she lies down flat and less relaxed points would simply essential tremor  Will sign off please call if new questions  Subjective:   Patient Seen and Examined.  Feels better still with orthostasis  Review of Systems:   No headache, eye, ear nose, throat problems; no wheezing or shortness of breath,   No chest pain or orthopnea, no abdominal pain, nausea or vomiting,   No psychiatric, neurological, endocrine, hematological   or cardiac complaints except as noted below.    + Fatigue, Cough,       Physical Exam:   BP 134/70    Pulse 67    Temp 97.7 F (36.5 C) (Oral)    Resp 18    Ht 1.753 m (5' 9.02")    Wt 83.4 kg (183 lb 14.4 oz)    SpO2 99%    BMI 27.14 kg/m     Neuro:  Level of consciousness:  Alert and appropriate  Oriented:  X 3  Facial Movements: symmetric  pupils are equal and reactive   Strength:  No upper extremity drift  Sensation to light touch: Intact bilaterally  HEENT: Normocephalic. No icter or congestion  Neck: supple, no lymphadenopathy, no thyromegaly, no JVD  CV: RRR, No murmers, rubs or gallops  Pulm: Clear to auscultation  BIL, nonlabored   Abd: soft, non-tender  No tremor when lying flat mild head tremor emerges when she is sitting up without any support no tremor in the upper or lower extremities no cogwheeling no rigidity no parkinsonian features on exam    Meds:      Scheduled Meds: PRN Meds:    albuterol-ipratropium, 3 mL, Nebulization, Q6H SCH  aspirin EC, 81 mg, Oral, Daily  atorvastatin, 10 mg, Oral, Daily  budesonide, 0.5 mg, Nebulization, BID  dextrose, 250 mL, Intravenous, Once  docusate sodium, 100 mg, Oral, Daily  enoxaparin, 40 mg, Subcutaneous, Daily  fludrocortisone, 0.1 mg, Oral, Daily  fluticasone, 1 spray, Each Nare, Daily  insulin glargine, 10 Units, Subcutaneous, QHS  insulin lispro, 1-4 Units, Subcutaneous, QHS  insulin lispro, 1-8 Units, Subcutaneous, TID AC  insulin lispro, 4 Units, Subcutaneous, TID AC  lidocaine, 1 patch, Transdermal, Q24H  losartan, 25 mg, Oral, Daily  midodrine, 10 mg, Oral, TID MEALS  pantoprazole, 40 mg, Intravenous, BID  polyethylene glycol, 17 g, Oral, Daily  predniSONE, 40 mg, Oral, QAM W/BREAKFAST  pregabalin, 25 mg, Oral, QHS  ranolazine, 500 mg, Oral, BID  sodium polystyrene, 15 g, Oral, Once  venlafaxine, 75 mg, Oral,  QHS      Continuous Infusions:   acetaminophen, 650 mg, Q6H PRN  albuterol, 2.5 mg, Q4H PRN  calcium carbonate, 1 tablet, Q6H PRN  glucagon (rDNA), 1 mg, PRN   And  dextrose, 250 mL, PRN   And  dextrose, 25 g, PRN   And  dextrose, 25 g, PRN  guaiFENesin-codeine, 5 mL, Q4H PRN  hydrALAZINE, 10 mg, Q6H PRN  magnesium sulfate, 1 g, PRN  melatonin, 3 mg, QHS PRN  naloxone, 0.2 mg, PRN  potassium & sodium phosphates, 2 packet, PRN  potassium chloride, 0-40 mEq, PRN   And  potassium chloride, 10 mEq, PRN  traMADol, 50 mg, Q6H PRN        Labs:     Recent Labs   Lab 02/01/21  0619   Glucose 101*   BUN 33.0*   Creatinine 1.3*   Calcium 9.7   Sodium 136   Potassium 4.7   Chloride 101   CO2 28       Recent Labs   Lab 02/01/21  0619   WBC 10.78*   Hgb 10.8*   Hematocrit 31.9*    MCV 97.0*   MCH 32.8   MCHC 33.9   Platelets 214                   Lab Results   Component Value Date    TSH 0.50 01/22/2021               .ll    No results for input(s): PTT, PT, INR in the last 72 hours.       Radiology Results (24 Hour)       ** No results found for the last 24 hours. **             All brain imaging (MRI, CT) personally reviewed.      I personally reviewed all of the medications.  Medication list generated using all available resources.  Elder abuse (physical)  - negative  Advanced care plan - reviewed from chart or in discussion with pt or family      This note was generated by the Epic EMR system/ Dragon speech recognition and may contain inherent errors or omissions not intended by the user. Grammatical errors, random word insertions, deletions and pronoun errors  are occasional consequences of this technology due to software limitations.   Not all errors are caught or corrected. If there are questions or concerns about the content of this note or information contained within the body of this dictation they should be addressed directly with the author for clarification.      Signed by: Cathe Mons, MD, MD  Spectralink: [id number]      Answering Service: [phone]

## 2021-02-01 NOTE — PT Progress Note (Signed)
[facility]  [address]  [place] [address]  [phone]    Physical Therapy Treatment    Patient:  Vanessa Kelly        MRN#:  [id number]  Unit:  [facility] INTER[facility]        Room/Bed:  MI626/MI626-02    Medical Diagnosis: Shortness of breath [R06.02]  Hiatal hernia [K44.9]  Hyponatremia [E87.1]  Hypoglycemia [E16.2]  Lesion of vertebra [M89.9]  Anemia, unspecified type [D64.9]  Acute bilateral low back pain without sciatica [M54.50]    Time of treatment:  PT Received On: 02/01/21  Start Time: 0925 Stop Time: 0942  Time Calculation (min): 17 min       Treatment #: PT Visit Number: 3/5    Patient's medical condition is appropriate for Physical Therapy intervention at this time.    Interpreter utilized: no, not indicated    Assessment   Pt in bed upon arrival and agreeable to PT session. Pt able to transition from supine to sit w/ supervision, STS w/ CGA and RW. Pt able to take a few steps, then stated she was dizzy and requested to sit down. BP upon sitting 123/75. Symptoms subsided within 1 min. Pt then able to stand w/ CGA and RW, did not report dizziness. Pt then able to walk 150' w/ RW and SBA. Pt took a standing rest break, afterwards, pt abel to ambulate another 150 w/ SBA. Pt with very slow gait speed, but steady gait w/ RW. Pt returned to sitting in bedside chair w/ CGA. Pt left in chair w/ tech at bedside.    PMP - Progressive Mobility Protocol   PMP Activity: Step 7 - Walks out of Room  Distance Walked (ft) (Step 6,7): 150 Feet     Plan   Based on today's performance:  Discharge Recommendation: ALF;Home with home health PT   DME Recommended for Discharge: No additional equipment/DME recommended at this time     Transport Recommendations: wheelchair van/transport    Discharge recommendations are based on patient's progression/regression. Please see most recent note for updated discharge recommendations.    Continue plan of care.    Interdisciplinary Communication:  RN      Subjective   "If I am feeling good, I want to keep walking"  Patient is agreeable to participation in the therapy session. Nursing clears patient for therapy.  Pain: pt denies pain      Vitals:   Vitals:    01/31/21 2355 02/01/21 0346 02/01/21 0815 02/01/21 0851   BP: 130/70 115/65 122/56 119/65   Pulse: 60 65 77 70   Resp:  17 18    Temp: 97.5 F (36.4 C) 97.7 F (36.5 C) 97.5 F (36.4 C)    TempSrc: Oral Oral Oral    SpO2: 99% 98% 100% 99%   Weight:       Height:           Objective     Precautions/ Contraindications:   Precautions  Weight Bearing Status: no restrictions  Other Precautions: Falls, monitor BP for orthostatic hypotension    Patient is in bed with  Telemetry, PrimaFit (external female catheter), and Intravenous Access in place.      Functional Mobility:  Supine to Sit: superv  Scooting: superv  Sit to Stand: CGA   Stand to Sit: CGA    Gait:   WB status: no restrictions  Assistive Device: RW  Assist Level: SBA  Distance: 150'x2  Pattern: decreased gait speed, decreased  step length      Educated the patient to role of physical therapy, plan of care, goals  of therapy, rationale for progressing mobility and HEP, safety with mobility and ADLs, pursed lip breathing, and home safety.    RN notified of session outcome and that patient was left in chair with all needs met and equipment intact.   Safety measures include: handoff to nurse/clin tech/ unit secretary completed, oriented to call bell and placed within reach, personal items within reach, assistive device positioned out of reach, and bed placed in lowest position.   Mobility and ADL status posted at bedside and within E.M.R.    Goals per Eval/ Re-eval:   Goals  Goal Formulation: With patient  Time for Goal Acheivement: 5 visits  Goals: Select goal  Pt Will Go Supine To Sit: modified independent  Pt Will Perform Sit To Supine: modified independent  Pt Will Transfer Bed/Chair: with rolling walker, modified independent  Pt Will Ambulate: >  200 feet, with four wheel walker, with rolling walker, modified independent        Therapist PPE during session procedural mask and gloves     Signature:  Lennox Pippins, PT  02/01/2021 9:45 AM   Phone: [phone]     (For scheduling questions, please contact rehab tech [phone])

## 2021-02-02 DIAGNOSIS — R2681 Unsteadiness on feet: Secondary | ICD-10-CM | POA: Insufficient documentation

## 2021-02-02 DIAGNOSIS — I2510 Atherosclerotic heart disease of native coronary artery without angina pectoris: Secondary | ICD-10-CM | POA: Insufficient documentation

## 2021-02-02 DIAGNOSIS — E2740 Unspecified adrenocortical insufficiency: Secondary | ICD-10-CM | POA: Insufficient documentation

## 2021-02-02 LAB — ALDOSTERONE/PLASMA RENIN ACTIVITY RATIO
Aldosterone: <1 ng/dL
PRA, LC/MS/MS: 2.27 ng/mL/h (ref 0.25–5.82)

## 2021-02-02 LAB — GLUCOSE WHOLE BLOOD - POCT
Whole Blood Glucose POCT: 92 mg/dL (ref 70–100)
Whole Blood Glucose POCT: 234 mg/dL — ABNORMAL HIGH (ref 70–100)
Whole Blood Glucose POCT: 239 mg/dL — ABNORMAL HIGH (ref 70–100)

## 2021-02-02 MED ORDER — DSS 100 MG PO CAPS
100.0000 mg | ORAL_CAPSULE | Freq: Every day | ORAL | Status: DC
Start: 2021-02-03 — End: 2022-06-23

## 2021-02-02 MED ORDER — POLYETHYLENE GLYCOL 3350 17 G PO PACK
17.0000 g | PACK | Freq: Every day | ORAL | Status: AC
Start: 2021-02-03 — End: ?

## 2021-02-02 MED ORDER — FLEET ENEMA 7-19 GM/118ML RE ENEM
1.0000 | ENEMA | Freq: Once | RECTAL | Status: AC
Start: 2021-02-02 — End: 2021-02-02
  Administered 2021-02-02: 15:00:00 1 via RECTAL
  Filled 2021-02-02: qty 1

## 2021-02-02 MED ORDER — MIDODRINE HCL 10 MG PO TABS
10.0000 mg | ORAL_TABLET | Freq: Three times a day (TID) | ORAL | Status: DC
Start: 2021-02-02 — End: 2022-01-20

## 2021-02-02 MED ORDER — COVID-19MRNA BIVAL VACC PFIZER 30 MCG/0.3ML IM SUSP
30.0000 ug | INTRAMUSCULAR | Status: AC | PRN
Start: 2021-02-02 — End: 2021-02-02
  Administered 2021-02-02: 20:00:00 30 ug via INTRAMUSCULAR
  Filled 2021-02-02: qty 0.3

## 2021-02-02 MED ORDER — FLUDROCORTISONE ACETATE 0.1 MG PO TABS
0.1000 mg | ORAL_TABLET | Freq: Every day | ORAL | Status: DC
Start: 2021-02-03 — End: 2022-01-20

## 2021-02-02 MED ORDER — PREDNISONE 20 MG PO TABS
40.0000 mg | ORAL_TABLET | Freq: Every morning | ORAL | 0 refills | Status: AC
Start: 2021-02-03 — End: 2021-02-06

## 2021-02-02 NOTE — Plan of Care (Addendum)
15:25 Patient remains alert  and able to communicate needs  Room air breathing easy Ambulate with help of walker, standby assist  Appetite good Patient complained of constipation. One time dose of fleet enema given as ordered.   Patient noted with large bowel movement. Patient voiced relief,. Plan for discharge to [facility]. Pick up time 7 pm. Patient aware of plan of care. Denies pain. Will continue with plan.  19:20 Report given to nurse A.   Pick up time changed to 1930 .  19:50 Report given to incoming nurse.  Covid Booster to be administered prior to discharge.

## 2021-02-02 NOTE — Progress Notes (Signed)
Patient's dtr informed CM, she prefers to be [place] to [facility].    She does not want [facility].    MD and Nursing notified via secured chat         02/02/21 1528   Discharge Disposition   Patient preference/choice provided? Yes   Physical Discharge Disposition SNF   Receiving facility, unit and room number: [facility]   Nursing report phone number: REPORT#[phone]   Mode of Transportation Wheelchair [facility] Bari up time [facility]ICK UP @ [phone]   Patient/Family/POA notified of transfer plan Yes   Patient agreeable to discharge plan/expected d/c date? Yes   Family/POA agreeable to discharge plan/expected d/c date? Yes   Bedside nurse notified of transport plan? Yes   Special requirements for patient during transport: Isolation   CM Interventions   Multidisciplinary rounds/family meeting before d/c? Yes   Medicare Checklist   Is this a Medicare patient? Yes   Patient received 1st IMM Letter? Yes   3 midnight inpatient qualifying stay (SNF only) Yes   If LOS 3 days or greater, did patient received 2nd IMM Letter? Yes   Medicaid/DMAS   Medicaid Pre-Screening completed No   DMAS 95 MI/MR completed and faxed Yes

## 2021-02-02 NOTE — Plan of Care (Signed)
Problem: Moderate/High Fall Risk Score >5  Goal: Patient will remain free of falls  Outcome: Progressing  Flowsheets (Taken 02/02/2021 1516)  High (Greater than 13): HIGH-Apply yellow "Fall Risk" arm band  VH High Risk (Greater than 13): Use assistive devices     Problem: Safety  Goal: Patient will be free from injury during hospitalization  Outcome: Progressing  Flowsheets (Taken 02/02/2021 1516)  Patient will be free from injury during hospitalization:   Provide and maintain safe environment   Include patient/ family/ care giver in decisions related to safety   Hourly rounding  Goal: Patient will be free from infection during hospitalization  Outcome: Progressing  Flowsheets (Taken 02/02/2021 1516)  Free from Infection during hospitalization:   Assess and monitor for signs and symptoms of infection   Monitor lab/diagnostic results   Encourage patient and family to use good hand hygiene technique     Problem: Pain  Goal: Pain at adequate level as identified by patient  Outcome: Progressing  Flowsheets (Taken 02/02/2021 1516)  Pain at adequate level as identified by patient:   Identify patient comfort function goal   Evaluate if patient comfort function goal is met   Offer non-pharmacological pain management interventions   Consult/collaborate with Physical Therapy, Occupational Therapy, and/or Speech Therapy     Problem: Side Effects from Pain Analgesia  Goal: Patient will experience minimal side effects of analgesic therapy  Outcome: Progressing     Problem: Discharge Barriers  Goal: Patient will be discharged home or other facility with appropriate resources  Outcome: Progressing     Problem: Psychosocial and Spiritual Needs  Goal: Demonstrates ability to cope with hospitalization/illness  Outcome: Progressing  Flowsheets (Taken 02/02/2021 1516)  Demonstrates ability to cope with hospitalizations/illness:   Encourage verbalization of feelings/concerns/expectations   Provide quiet environment   Assist patient to  identify own strengths and abilities     Problem: Compromised Tissue integrity  Goal: Damaged tissue is healing and protected  Outcome: Progressing  Goal: Nutritional status is improving  Outcome: Progressing     Problem: Inadequate Gas Exchange  Goal: Adequate oxygenation and improved ventilation  Outcome: Progressing     Problem: Diabetes: Glucose Imbalance  Goal: Blood glucose stable at established goal  Outcome: Progressing  Flowsheets (Taken 02/02/2021 1516)  Blood glucose stable at established goal:   Monitor lab values   Monitor/assess vital signs   Assess for hypoglycemia /hyperglycemia   Ensure adequate hydration   Ensure patient/family has adequate teaching materials     Problem: Hemodynamic Status: Cardiac  Goal: Stable vital signs and fluid balance  Outcome: Progressing  Flowsheets (Taken 02/02/2021 1516)  Stable vital signs and fluid balance:   Monitor/assess vital signs and telemetry per unit protocol   Assess signs and symptoms associated with cardiac rhythm changes   Monitor lab values   Monitor intake/output per unit protocol and/or LIP order     Problem: Contact Isolation  Goal: Prevent transmission of Rotavirus while caring for patient in isolation  Outcome: Progressing  Flowsheets  Taken 01/30/2021 1150 by Merrilee Jansky, RN  Is appropriate isolation sign and PPE outside Patient Room?: Yes  Strict Hand Hygiene: Yes  Taken 01/29/2021 1044 by Merrilee Jansky, RN  Provided Patient and family education: Verbal education  Donning and doffing PPE properly both Staff and Visitors: Yes  Multi-dose Medications stored in plastic bags?: Yes

## 2021-02-02 NOTE — Progress Notes (Addendum)
Per [facility] pick up is @ 7 pm going to [address] [place] [address]  Per CM request, pick up time changed to @ 5 pm.  Per [facility] [phone] pick up @ 2:45 pm.

## 2021-02-02 NOTE — Discharge Summary (Signed)
MEDICINE DISCHARGE SUMMARY    Date Time: 02/02/21 11:30 AM  Patient Name: Vanessa Kelly  Attending Physician: Arlyn Dunning, MD  Primary Care Physician: Marisa Sprinkles, MD    Date of Admission: 01/21/2021  Date of Discharge: 02/02/2021    Discharge Diagnoses:        #Acute asthmatic bronchitis  # Fall with orthostatic hypotension  # Recurrent falls  #.  Diabetes mellitus type 2  #.  History of GERD and hiatal hernia  #.  History of hypertension  #. Chronic low back pain         Disposition:    Disposition: SNF       Procedures/Radiology performed:   Radiology: all results from this admission  CT Reconstruction T-spine    Result Date: 01/21/2021  1. No evidence of lumbar spine fracture. 2. Multilevel degenerative changes throughout the thoracic and lumbar spine most severely affecting the mid and lower lumbar spine. 3. Indeterminate sclerotic lesion within the L5 vertebral body. Please correlate with possible history of possible history of primary neoplasm. Whole-body bone scan date performed to further evaluate this lesion. Fonnie Mu, DO  01/21/2021 5:11 PM    XR Hip right 1 vw with pelvis    Result Date: 01/27/2021  No acute osseous abnormality. Rozann Lesches, MD  01/27/2021 4:31 PM    CT Angio Chest (PE study)    Result Date: 01/21/2021  1. No evidence for pulmonary embolism. 2. Large hiatal hernia with partial intrathoracic location of the stomach. Wyatt Portela, MD  01/21/2021 5:01 PM    CT L- Spine without Contrast    Result Date: 01/21/2021  1. No evidence of lumbar spine fracture. 2. Multilevel degenerative changes throughout the thoracic and lumbar spine most severely affecting the mid and lower lumbar spine. 3. Indeterminate sclerotic lesion within the L5 vertebral body. Please correlate with possible history of possible history of primary neoplasm. Whole-body bone scan date performed to further evaluate this lesion. Fonnie Mu, DO  01/21/2021 5:11 PM    MRI Brain W WO Contrast    Result Date: 01/27/2021  1. No acute  intracranial pathology. 2. Paranasal sinus disease. 3. Right mastoid effusion. Rozann Lesches, MD  01/27/2021 8:12 AM    MRI Cervical Spine WO Contrast    Result Date: 01/27/2021  1. Multilevel spondylosis, as above. 2. No evidence of spinal cord lesion. Rozann Lesches, MD  01/27/2021 8:26 AM    NM Bone Scan Whole Body    Result Date: 01/25/2021  No abnormal uptake seen scintigraphically to correspond to the region of abnormality seen on magnetic resonance imaging. Management options for the indeterminant abnormality at L5 with include follow-up imaging in 3-4 months to evaluate stability, comparison with old films if these can be made available, or tissue sampling. Laurena Slimmer, MD  01/25/2021 2:17 PM   Surgery: all results from this admission  * No surgery found *     Hospital Course:     Reason for admission/ HPI:            This is a 80 y.o. female with PMH of  Diabetes mellitus, GERD, hyperlipidemia, hypertension, TIA/carotid artery stenosis, CAD s/p CABG, chronic back pain, osteoarthritis, s/p right TKR, lower extremity venous insufficiency, orthostatic hypotension who presented with cough and shortness of breath and wheezing for the past 3 weeks.  Patient denies fever or chills at home.  Patient also report a fall a day before hospitalization.  In the hospital patient was started on breathing treatment  with nebs with some improvement in symptoms.  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   Seen by neurology. Has recurrent falls, likely multifactorial with known history of chronic back pain, arthritis and knee replacement as well as orthostatic hypotension.  Neurology evaluated and suggested  stop Lyrica .        #Acute asthmatic bronchitis.  Respiratory viral panel positive for rhinovirus.  Echo showed normal ejection fraction.  Started on nebs treatment and  budesonide as well as p.o. prednisone with improvement in symptoms.  plan prednisone  40 mg for total 5 days as prescribed      # Fall with orthostatic hypotension.   Midodrine dose increased to 7.5 mg 3 times daily.   Endocrine consulted with recommendations to follow-up as outpatient to repeat cosyntropin stimulation test when patient is off prednisone.  Continue compression stockings.  s/p iv hydration and stopped furosemide from home meds.      # Recurrent falls.  Likely multifactorial with known history of chronic back pain, arthritis and knee replacement as well as orthostatic hypotension.  Neurology evaluated and suggested  stop Lyrica .    Continue PT/OT. Appears deconditioned. Possible SNF on discharge      #.  Diabetes mellitus type 2.  Blood glucose are elevated likely in setting of steroid use.    lantus    14 units and prandial lispro to 6 units.      #.  History of GERD and hiatal hernia.  Continue PPI.     #.  History of hypertension.  Hydralazine switched to po losartan 25mg  daily due to  known DM and Orthostatic hypotension. Can discontinue Cozaar if  symptomatic orthostatic hypotension persists.      #. Chronic low back pain.  MRI lumbar spine in August shows multilevel degenerative disc disease.  Continue as needed pain medication and PT/OT.   Suggest MRI Lumbar spine due to worsening Lumbar Back pain, and Indeterminate abnormality abnormality at L5 on Bone scan this can be done as outpatient as well              Discharge condition: stable     Discharge Day Exam:  Today:  BP (!) 88/60   Pulse 66   Temp 97.3 F (36.3 C) (Oral)   Resp 20   Ht 1.753 m (5' 9.02")   Wt 83.4 kg (183 lb 14.4 oz)   SpO2 100%   BMI 27.14 kg/m   Ranges for the last 24 hours:  Temp:  [97.3 F (36.3 C)-97.7 F (36.5 C)] 97.3 F (36.3 C)  Heart Rate:  [56-69] 66  Resp Rate:  [16-20] 20  BP: (88-144)/(60-74) 88/60  Body mass index is 27.14 kg/m.      Intake/Output Summary (Last 24 hours) at 02/02/2021 1130  Last  data filed at 02/02/2021 0836  Gross per 24 hour   Intake 790 ml   Output --   Net 790 ml       Cardiovascular: regular rate and rhythm, no murmurs, rubs or gallops  Lungs: clear to auscultation bilaterally, no additional sounds  Abdomen: soft, non-tender, non-distended; normoactive bowel sounds  Extremities: no edema  Neurological: Alert and oriented X 3         Wounds/decutibus ulcers/stage:    Consultations:   Treatment Team:   Attending Provider: Arlyn Dunning, MD  Consulting Physician: Alinda Money, MD  Consulting Physician: Cathe Mons, MD  Recent Labs - Last 2:       Recent Labs   Lab 02/01/21  [phone]   WBC 10.78*   Hgb 10.8*   Hematocrit 31.9*   Platelets 214         Recent Labs   Lab 02/01/21  0619   Sodium 136   Potassium 4.7   Chloride 101   CO2 28   BUN 33.0*   Creatinine 1.3*   EGFR 47.4   Glucose 101*   Calcium 9.7                Invalid input(s): FREET4         Microbiology Results (last 15 days)       Procedure Component Value Units Date/Time    Respiratory Pathogen Panel w/COVID-19, PCR [[id number]]  (Abnormal) Collected: 01/22/21 1813    Order Status: Completed Specimen: Nasopharyngeal Updated: 01/23/21 1349     Adenovirus Not Detected     Coronavirus 229E Not Detected     Coronavirus HKU1 Not Detected     Coronavirus NL63 Not Detected     Coronavirus OC43 Not Detected     SARS CoV-2 RNA Not Detected     Human Metapneumovirus Not Detected     Human Rhinovirus/Enterovirus Detected     Influenza A Not Detected     Influenza A/H1 Not Detected     Influenza AH1 - 2009 Not Detected     Influenza A/H3 Not Detected     Influenza B Not Detected     Parainfluenza Virus 1 Not Detected     Parainfluenza Virus 2 Not Detected     Parainfluenza Virus 3 Not Detected     Parainfluenza Virus 4 Not Detected     Respiratory Syncytial Virus Not Detected     Bordetella pertussis Not Detected     Bordetella parapertussis PCR Not Detected     Chlamydophila pneumoniae Not Detected     Mycoplasma pneumoniae Not  Detected     Source NP Swab     Test Comment for Respiratory Panel with COVID See Below     Comment: Corrected result; previously reported as 1 on 01/23/21 at 13:49 by V/AUT  Testing performed using the [employer]  Respiratory Panel (RP 2.1)  This test is for the qualitative detection of  respiratory pathogen nucleic acid. This assay cannot  differentiate between Rhinovirus/Enterovirus. If  necessary for patient care, a positive result for  Rhinovirus/Enterovirus may be followed-up using an  alternate method. This assay should not be used if  B. pertussis infection is specifically suspected as it  is less sensitive than other alternatives. If suspected,  ensure that B. pertussis specific testing is ordered.  Recent administration of a nasal influenza vaccine may  cause false positive results for Influenza A and/or  Influenza B.  Viral and bacterial nucleic acids may persist even  though no viable organism is present. Detection of  nucleic acid does not imply that the corresponding  organisms are infectious or are the causative agents of  clinical symptoms. Positive results of this test do not  rule out coinfection with other organisms. A negative  result does not exclude the possibility of viral or  bacterial infection.  Assay performance characteristics  may vary with circulating strains and this assay may not  be able to distinguish between existing viral strains  and new variants as they emerge. Results of this test  should not be used as the sole basis for diagnosis,  treatment, or other patient management decisions.  This assay is FDA authorized for nasopharyngeal swab  samples.  Performance characteristics for  Bronchoalveolar lavage samples have been determined by  the [facility] laboratory. Other sample types are unacceptable.  Invalid results may be due to inhibiting substances in  the specimen and recollection should occur.          Purpose of COVID testing Diagnostic -PUI    Narrative:      For NP, Call  laboratory for VTM NP Swab collection device.  Bronchoalveolar Lavage is submitted in a sterile container.  Diagnostic -PUI    COVID-19 (SARS-CoV-2) only (Liat Rapid) asymptomatic admission - Hospitals [[id number]] Collected: 01/21/21 1432    Order Status: Completed Specimen: Nasopharyngeal Updated: 01/21/21 1503     Purpose of COVID testing Screening     SARS-CoV-2 Specimen Source Nasal Swab     SARS CoV 2 Overall Result Not Detected     Comment: __________________________________________________  -A result of "Detected" indicates POSITIVE for the    presence of SARS CoV-2 RNA  -A result of "Not Detected" indicates NEGATIVE for the    presence of SARS CoV-2 RNA  __________________________________________________________  Test performed using the Roche cobas Liat SARS-CoV-2 assay. This assay is  only for use under the Food and Drug Administration's Emergency Use  Authorization. This is a real-time RT-PCR assay for the qualitative  detection of SARS-CoV-2 RNA. Viral nucleic acids may persist in vivo,  independent of viability. Detection of viral nucleic acid does not imply the  presence of infectious virus, or that virus nucleic acid is the cause of  clinical symptoms. Negative results do not preclude SARS-CoV-2 infection and  should not be used as the sole basis for diagnosis, treatment or other  patient management decisions. Negative results must be combined with  clinical observations, patient history, and/or epidemiological information.  Invalid results may be due to inhibiting substances in the specimen and  recollection should occur. Please see Fact Sheets for patients and providers  located:  WirelessDSLBlog.no         Narrative:      o Collect and clearly label specimen type:  o PREFERRED-Upper respiratory specimen: One Nasal Swab in  Transport Media.  o Hand deliver to laboratory ASAP  Indication for testing->Extended care facility admission to  semi private room  Screening             Discharge Instructions & Follow Up Plan for Patient:   Discharge Diet: Diet Consistent Carbohydrate and Heart Healthy  Supervise For Meals Frequency: All meals  Activity/Weight Bearing Status: as tolerated    Patient was instructed to follow up with:      Follow-up Information       Pcp, None, MD .                             Complete instructions and follow up are in the patient's After Visit Summary    Minutes spent coordinating discharge and reviewing discharge plan: 33 minutes    Discharge Medications:        Medication List        START taking these medications      docusate sodium 100 MG capsule  Commonly known as: COLACE  Take 1 capsule (100 mg total) by mouth daily  Start taking on: February 03, 2021     fludrocortisone 0.1 MG  tablet  Commonly known as: FLORINEF  Take 1 tablet (0.1 mg total) by mouth daily  Start taking on: February 03, 2021     midodrine 10 MG tablet  Commonly known as: PROAMATINE  Take 1 tablet (10 mg total) by mouth 3 (three) times daily with meals     polyethylene glycol 17 g packet  Commonly known as: MIRALAX  Take 17 g by mouth daily  Start taking on: February 03, 2021     predniSONE 20 MG tablet  Commonly known as: DELTASONE  Take 2 tablets (40 mg total) by mouth every morning with breakfast for 3 days  Start taking on: February 03, 2021            CONTINUE taking these medications      acetaminophen 325 MG tablet  Commonly known as: TYLENOL     aspirin EC 81 MG EC tablet     calcium carbonate 500 MG chewable tablet  Commonly known as: TUMS     * candesartan 4 MG tablet  Commonly known as: ATACAND     * candesartan 4 MG tablet  Commonly known as: ATACAND     Centrum Silver 50+Women Tabs     ergocalciferol 1.25 MG (50000 UT) capsule  Commonly known as: ERGOCALCIFEROL     fexofenadine 180 MG tablet  Commonly known as: ALLEGRA     Flonase Sensimist 27.5 MCG/SPRAY nasal spray  Generic drug: fluticasone     lidocaine 5 %  Commonly known as: LIDODERM     lovastatin 40 MG tablet  Commonly  known as: MEVACOR     pantoprazole 40 MG tablet  Commonly known as: PROTONIX     pregabalin 25 MG capsule  Commonly known as: LYRICA     ranolazine 500 MG 12 hr tablet  Commonly known as: RANEXA     traMADol 50 MG tablet  Commonly known as: ULTRAM     Trulicity 0.75 MG/0.5ML Sopn  Generic drug: Dulaglutide     venlafaxine 37.5 MG 24 hr capsule  Commonly known as: EFFEXOR-XR     zolpidem 5 MG tablet  Commonly known as: AMBIEN           * This list has 2 medication(s) that are the same as other medications prescribed for you. Read the directions carefully, and ask your doctor or other care provider to review them with you.                STOP taking these medications      furosemide 40 MG tablet  Commonly known as: LASIX               Where to Get Your Medications        Information about where to get these medications is not yet available    Ask your nurse or doctor about these medications  docusate sodium 100 MG capsule  fludrocortisone 0.1 MG tablet  midodrine 10 MG tablet  polyethylene glycol 17 g packet  predniSONE 20 MG tablet         Immunizations provided:   There is no immunization history on file for this patient.    This note was generated by the Epic EMR system/ Dragon speech recognition and may contain inherent errors or omissions not intended by the user. Grammatical errors, random word insertions, deletions and pronoun errors  are occasional consequences of this technology due to software limitations. Not all errors are caught or corrected. If there are questions or concerns  about the content of this note or information contained within the body of this dictation they should be addressed directly with the author for clarification.    Signed by: Arlyn Dunning, MD   [facility]  Department of Medicine  CC: Pcp, None, MD

## 2021-02-02 NOTE — Progress Notes (Signed)
Pt wheeled down by transport to go back to nursing home. Report given by previous RN Ramatu . Pt states that she verbalized plan of care . Covid booster given . Belongings taken home with patient

## 2021-05-05 ENCOUNTER — Ambulatory Visit (INDEPENDENT_AMBULATORY_CARE_PROVIDER_SITE_OTHER): Payer: Medicare Other | Admitting: Internal Medicine

## 2021-05-18 ENCOUNTER — Encounter (INDEPENDENT_AMBULATORY_CARE_PROVIDER_SITE_OTHER): Payer: Self-pay | Admitting: Internal Medicine

## 2021-05-18 ENCOUNTER — Ambulatory Visit (INDEPENDENT_AMBULATORY_CARE_PROVIDER_SITE_OTHER): Payer: Medicare Other | Admitting: Internal Medicine

## 2021-05-18 VITALS — BP 118/72 | HR 65 | Ht 69.0 in | Wt 181.6 lb

## 2021-05-18 DIAGNOSIS — I2510 Atherosclerotic heart disease of native coronary artery without angina pectoris: Secondary | ICD-10-CM

## 2021-05-18 DIAGNOSIS — I2583 Coronary atherosclerosis due to lipid rich plaque: Secondary | ICD-10-CM

## 2021-05-18 LAB — ECG 12-LEAD
Atrial Rate: 67 {beats}/min
P Axis: 37 degrees
P-R Interval: 234 ms
Q-T Interval: 390 ms
QRS Duration: 84 ms
QTC Calculation (Bezet): 412 ms
R Axis: -29 degrees
T Axis: 38 degrees
Ventricular Rate: 67 {beats}/min

## 2021-05-18 NOTE — Progress Notes (Signed)
[facility] OFFICE CONSULTATION    I had the pleasure of seeing [name] today for cardiovascular evaluation. She is a pleasant 81 y.o. female with a history of CABG 3V 2002 [facility], [facility],  HTN, DM, CVA, carotid artery stenosis who presents for follow-up.  She was admitted to [facility] 01/21/2021 with acute bronchitis, fall with orthostatic hypotension.  She was discharged on fludrocortisone and midodrine.  She is no longer on fludrocortisone but takes midodrine as needed.  She wears TED hose daily.  She is on candesartan with hold parameters.  She feels well.  She has no chest pain, shortness of breath, palpitations.  Rare dizziness, no syncope.  No falls.  She was undergoing PT at her facility, she intends to resume.  She walks with a walker for stability.  She contracted COVID-19 infection after Thanksgiving.  Her nursing home is currently under quarantine.      Cardiologist Darleen Crocker MD -[facility] - last     PAST MEDICAL HISTORY: She has a past medical history of Diabetes mellitus, Gastroesophageal reflux disease, Hyperlipidemia, Hypertension, and Seasonal allergic rhinitis. She has no past surgical history on file.        MEDICATIONS:   Current Outpatient Medications   Medication Sig Dispense Refill    acetaminophen (TYLENOL) 325 MG tablet Take 650 mg by mouth every 6 (six) hours as needed for Pain (or headache)      aspirin EC 81 MG EC tablet Take 81 mg by mouth daily      calcium carbonate (TUMS) 500 MG chewable tablet Chew 1 tablet by mouth every 6 (six) hours as needed for Heartburn      candesartan (ATACAND) 4 MG tablet Take 4 mg by mouth nightly      candesartan (ATACAND) 4 MG tablet Take 4 mg by mouth daily as needed (SBP > 160)      docusate sodium (COLACE) 100 MG capsule Take 1 capsule (100 mg total) by mouth daily      Dulaglutide (Trulicity) 0.75 MG/0.5ML Solution Pen-injector Inject 0.75 mg into the skin  Once each week on Tuesday morning      ergocalciferol (ERGOCALCIFEROL) 1.25 MG (50000 UT) capsule Take 50,000 Units by mouth once a week On Saturday      fexofenadine (ALLEGRA) 180 MG tablet Take 180 mg by mouth daily      fludrocortisone (FLORINEF) 0.1 MG tablet Take 1 tablet (0.1 mg total) by mouth daily      fluticasone (Flonase Sensimist) 27.5 MCG/SPRAY nasal spray 1 spray by Nasal route daily      lidocaine (LIDODERM) 5 % Place 1 patch onto the skin every 24 hours Remove & Discard patch within 12 hours or as directed by MD      lovastatin (MEVACOR) 40 MG tablet Take 40 mg by mouth nightly      midodrine (PROAMATINE) 10 MG tablet Take 1 tablet (10 mg total) by mouth 3 (three) times daily with meals      Multiple Vitamins-Minerals (Centrum Silver 50+Women) Tab Take 1 tablet by mouth daily      pantoprazole (PROTONIX) 40 MG tablet Take 40 mg by mouth every morning before breakfast      polyethylene glycol (MIRALAX) 17 g packet Take 17 g by mouth daily      ranolazine (RANEXA) 500 MG 12 hr tablet Take 500 mg by mouth 2 (two) times daily      traMADol (  ULTRAM) 50 MG tablet Take 50 mg by mouth every 8 (eight) hours as needed for Pain      venlafaxine (EFFEXOR-XR) 37.5 MG 24 hr capsule Take 75 mg by mouth nightly      zolpidem (AMBIEN) 5 MG tablet Take 5 mg by mouth nightly       No current facility-administered medications for this visit.           ALLERGIES:   Allergies   Allergen Reactions    Fentanyl Anaphylaxis     Hives    Percolone [Oxycodone]          FAMILY HISTORY: Her family history includes Heart failure in her mother; Myocardial Infarction in her father.      SOCIAL HISTORY: She reports that she quit smoking about 33 years ago. Her smoking use included cigarettes. She has never used smokeless tobacco. She reports that she does not currently use alcohol. She reports that she does not use drugs.      REVIEW OF SYSTEMS: All other systems reviewed and negative except as above.         PHYSICAL  EXAMINATION  General Appearance:  A well-appearing female in no acute distress.    Visit Vitals  BP 118/72 (BP Site: Left arm, Patient Position: Sitting, Cuff Size: Large)   Pulse 65   Ht 1.753 m (5\' 9" )   Wt 82.4 kg (181 lb 9.6 oz)   SpO2 99%   BMI 26.82 kg/m         HEENT: Sclera anicteric, conjunctiva without pallor, moist mucous membranes, normal dentition. No arcus.   Neck:  Supple without jugular venous distention. Thyroid nonpalpable. Normal carotid upstrokes without bruits.   Chest: Clear to auscultation bilaterally with good air movement and respiratory effort and no wheezes, rales, or rhonchi   Cardiovascular: Normal S1 and physiologically split S2 without murmurs, gallops or rub. PMI of normal size and nondisplaced.   Abdomen: Soft, nontender, nondistended, with normoactive bowel sounds. No organomegaly.  No pulsatile masses, or bruits.   Extremities: Warm without significant edema.  Skin: No rash, xanthoma or xanthelasma.   Neuro: Alert and oriented x3. Grossly intact. Strength is symmetrical. Normal mood and affect.           ECG today, I have independently reviewed the tracing, sinus rhythm, first-degree AV block, PACs        LABS: Reviewed in epic    Echocardiogram 12/01/2020         Summary    * Left ventricular ejection fraction is normal with an estimated ejection  fraction of 65-70%.    * There is mild concentric left ventricular hypertrophy.    * Left ventricular diastolic filling parameters demonstrate normal diastolic  function.    * Normal right ventricular systolic function.    * There is trace aortic regurgitation.    * No pulmonary hypertension with estimated right ventricular systolic  pressure of  23 mmHg.    * The IVC is normal in size with > 50% respiratory variance consistent with  normal RA pressure of 3 mmHg.    PET myocardial perfusion imaging 01/27/2020 - Equivocal. Small sized area of ischemia in the apical wall. EF 85%, no RWMA    Carotid artery Dopplers 05/11/2020      Findings:   The right and left side common carotid arteries, bifurcations, carotid bulbs as well as the internal and external carotid arteries show no evidence of obstruction or significant stenosis. Peak systolic flow velocities  are within the normal range with normal-appearing Doppler waveforms.     Right common artery: Peak systolic velocity 184 cm/s     Right internal carotid artery: Peak systolic velocity 142 cm/s     Left common carotid artery: Peak systolic velocity 144 cm/s     Left internal carotid artery: Peak systolic velocity 165 cm/s     The right ICA/CCA ratio is 0.9     The left ICA/CCA ratio is 1.3   Atherosclerotic plaque is seen of the carotid bulbs bilaterally.   Vertebral artery flow is antegrade bilaterally       IMPRESSION:   1. Atherosclerotic disease, bilateral 50-69% stenosis internal carotid arteries.      IMPRESSION/RECOMMENDATIONS:     CAD status post CABG 2002, status post stents 2006 -stable, asymptomatic.  She reports cardiac catheterization in 2019/ 2020 at [facility], she was to have a stent at that time however the stent could not be placed.  Cardiac cath report requested again.  Mild apical ischemia on PET myocardial fusion scan 01/2020.  Continue with aspirin, statin, Ranexa.  She remains asymptomatic.  Continue to monitor.  Not on beta-blocker due to borderline sinus bradycardia.    Hypertension  -, takes candesartan as needed for elevated blood pressure readings.    History of orthostatic hypotension -stable, asymptomatic.  Recent diagnosis of Parkinson's disease likely contributing.  Avoid dehydration.  Continue with TED hose.  Hospital admission 12/2020 with falls and orthostasis.  Not orthostatic on exam today.  Discharged on fludrocortisone and midodrine.  Currently on midodrine only as needed.  Fludrocortisone has been discontinued.    Prior history of CVA      Diabetes -Per endocrinology/PCP.    Carotid artery disease -following with Dr. Doreatha Martin, vascular surgery.    Recent diagnosis of  depression.    20 minutes spent.  Hospital records reviewed.    Follow-up in 6 months.    Rich Number, MD  05/18/2021, 10:59 AM  [facility]  [phone]

## 2021-05-19 ENCOUNTER — Encounter (INDEPENDENT_AMBULATORY_CARE_PROVIDER_SITE_OTHER): Payer: Self-pay | Admitting: Internal Medicine

## 2021-05-19 NOTE — Progress Notes (Signed)
Records request sent via scan to email [email or web address]) to [facility] yesterday 05/18/21.  Awaiting records.

## 2021-05-21 ENCOUNTER — Other Ambulatory Visit: Payer: Self-pay | Admitting: Family

## 2021-05-21 DIAGNOSIS — M810 Age-related osteoporosis without current pathological fracture: Secondary | ICD-10-CM

## 2021-05-21 DIAGNOSIS — Z1231 Encounter for screening mammogram for malignant neoplasm of breast: Secondary | ICD-10-CM

## 2021-05-25 ENCOUNTER — Telehealth (INDEPENDENT_AMBULATORY_CARE_PROVIDER_SITE_OTHER): Payer: Self-pay

## 2021-05-25 NOTE — Telephone Encounter (Signed)
I have resubmitted the fax request for continuity of care records for the 2019-2020 Cardiac Catheterization report originally requested by Dr. Celene Skeen.   Awaiting reply

## 2021-05-27 ENCOUNTER — Ambulatory Visit: Payer: Medicare Other

## 2021-06-01 ENCOUNTER — Ambulatory Visit
Admission: RE | Admit: 2021-06-01 | Discharge: 2021-06-01 | Disposition: A | Discharge status: Home / Self Care | Payer: Medicare Other | Source: Ambulatory Visit | Attending: Family | Admitting: Family

## 2021-06-01 DIAGNOSIS — M810 Age-related osteoporosis without current pathological fracture: Secondary | ICD-10-CM

## 2021-06-01 DIAGNOSIS — Z1231 Encounter for screening mammogram for malignant neoplasm of breast: Secondary | ICD-10-CM | POA: Insufficient documentation

## 2021-07-16 ENCOUNTER — Telehealth (INDEPENDENT_AMBULATORY_CARE_PROVIDER_SITE_OTHER): Payer: Self-pay | Admitting: Internal Medicine

## 2021-07-16 NOTE — Telephone Encounter (Signed)
Patient to call back to schedule follow up in July-recall

## 2021-09-03 ENCOUNTER — Other Ambulatory Visit: Payer: Self-pay | Admitting: Family

## 2021-09-03 DIAGNOSIS — S0990XD Unspecified injury of head, subsequent encounter: Secondary | ICD-10-CM

## 2021-09-08 ENCOUNTER — Ambulatory Visit (INDEPENDENT_AMBULATORY_CARE_PROVIDER_SITE_OTHER): Payer: Medicare Other | Admitting: Cardiovascular Disease

## 2021-09-08 NOTE — Progress Notes (Deleted)
[facility]    No chief complaint on file.      Assessment and Plan     IMPRESSION/RECOMMENDATIONS:      CAD status post CABG 2002, status post stents 2006 -stable, asymptomatic.  She reports cardiac catheterization in 2019/ 2020 at [facility], she was to have a stent at that time however the stent could not be placed.  Cardiac cath report requested again.  Mild apical ischemia on PET myocardial fusion scan 01/2020.  Continue with aspirin, statin, Ranexa.  She remains asymptomatic.  Continue to monitor.  Not on beta-blocker due to borderline sinus bradycardia.     Hypertension  -, takes candesartan as needed for elevated blood pressure readings.     History of orthostatic hypotension -stable, asymptomatic.  Recent diagnosis of Parkinson's disease likely contributing.  Avoid dehydration.  Continue with TED hose.  Hospital admission 12/2020 with falls and orthostasis.  Not orthostatic on exam today.  Discharged on fludrocortisone and midodrine.  Currently on midodrine only as needed.  Fludrocortisone has been discontinued.     Prior history of CVA        Diabetes -Per endocrinology/PCP.     Carotid artery disease -following with Dr. Doreatha Martin, vascular surgery.     Recent diagnosis of depression.     History of Present Illness   Patient follows with Dr. Celene Skeen.      Past Medical History     Past Medical History:   Diagnosis Date    Diabetes mellitus     Gastroesophageal reflux disease     Hyperlipidemia     Hypertension     Seasonal allergic rhinitis        Past Surgical History     Past Surgical History:   Procedure Laterality Date    BREAST BIOPSY Right     2014    HYSTERECTOMY      partial-1970's       Family History     Family History   Problem Relation Age of Onset    Heart failure Mother     Myocardial Infarction Father     Breast cancer Sister        Social History     Social History     Socioeconomic History    Marital status: Married   Tobacco Use    Smoking status: Former     Types: Cigarettes     Quit  date: 1990     Years since quitting: 33.3    Smokeless tobacco: Never   Vaping Use    Vaping status: Never Used   Substance and Sexual Activity    Alcohol use: Not Currently    Drug use: Never       Allergies     Allergies   Allergen Reactions    Fentanyl Anaphylaxis     Hives    Percolone [Oxycodone]        Medications     Current Outpatient Medications   Medication Sig Dispense Refill    acetaminophen (TYLENOL) 325 MG tablet Take 650 mg by mouth every 6 (six) hours as needed for Pain (or headache)      aspirin EC 81 MG EC tablet Take 81 mg by mouth daily      calcium carbonate (TUMS) 500 MG chewable tablet Chew 1 tablet by mouth every 6 (six) hours as needed for Heartburn      candesartan (ATACAND) 4 MG tablet Take 4 mg by mouth nightly      candesartan (ATACAND)  4 MG tablet Take 4 mg by mouth daily as needed (SBP > 160)      docusate sodium (COLACE) 100 MG capsule Take 1 capsule (100 mg total) by mouth daily      Dulaglutide (Trulicity) 0.75 MG/0.5ML Solution Pen-injector Inject 0.75 mg into the skin Once each week on Tuesday morning      ergocalciferol (ERGOCALCIFEROL) 1.25 MG (50000 UT) capsule Take 50,000 Units by mouth once a week On Saturday      fexofenadine (ALLEGRA) 180 MG tablet Take 180 mg by mouth daily      fludrocortisone (FLORINEF) 0.1 MG tablet Take 1 tablet (0.1 mg total) by mouth daily      fluticasone (Flonase Sensimist) 27.5 MCG/SPRAY nasal spray 1 spray by Nasal route daily      lidocaine (LIDODERM) 5 % Place 1 patch onto the skin every 24 hours Remove & Discard patch within 12 hours or as directed by MD      lovastatin (MEVACOR) 40 MG tablet Take 40 mg by mouth nightly      midodrine (PROAMATINE) 10 MG tablet Take 1 tablet (10 mg total) by mouth 3 (three) times daily with meals      Multiple Vitamins-Minerals (Centrum Silver 50+Women) Tab Take 1 tablet by mouth daily      pantoprazole (PROTONIX) 40 MG tablet Take 40 mg by mouth every morning before breakfast      polyethylene glycol (MIRALAX)  17 g packet Take 17 g by mouth daily      ranolazine (RANEXA) 500 MG 12 hr tablet Take 500 mg by mouth 2 (two) times daily      traMADol (ULTRAM) 50 MG tablet Take 50 mg by mouth every 8 (eight) hours as needed for Pain      venlafaxine (EFFEXOR-XR) 37.5 MG 24 hr capsule Take 75 mg by mouth nightly      zolpidem (AMBIEN) 5 MG tablet Take 5 mg by mouth nightly       No current facility-administered medications for this visit.         Review of Systems     Constitutional: Negative for fevers and chills  Skin: No rash or lesions  Respiratory: Negative for cough, wheezing, or hemoptysis  Cardiovascular: as per HPI  Gastrointestinal: Negative for abdominal pain, nausea, vomiting and diarrhea  Musculoskeletal:  No arthritic symptoms  Genitourinary: Negative for dysuria  Otherwise 10 point review of systems is negative.      Physical Exam     There were no vitals filed for this visit.    There is no height or weight on file to calculate BMI.    General:  Patient appears their stated age, well-nourished.  Alert and in no apparent distress.  Eyes: No conjunctivitis, no purulent discharge, no lid lag  ENT:  Hearing grossly intact, Nares patent bilaterally, Lips moist, color appropriate for race.  Respiratory: Clear to auscultation and percussion throughout. Respiratory effort unlabored, chest expansion symmetric.    Cardio: Regular rate and rhythm. Normal S1/S2 No carotid bruits or thrills, no JVD.  Extremities: warm, pulses 2+, no peripheral edema  GI: Soft, nondistended, nontender.  No guarding or rebound.  Skin: Color appropriate for race, Skin warm, dry, and intact  Psychiatric: Good insight and judgment, oriented to person, place, and time    Labs     Lipid Panel No results found for: CHOL, TRIG, HDL  CBC   Lab Results   Component Value Date    WBC 10.78 (H) 02/01/2021  HGB 10.8 (L) 02/01/2021    HCT 31.9 (L) 02/01/2021    MCV 97.0 (H) 02/01/2021    PLT 214 02/01/2021      BMP  Lab Results   Component Value Date    CO2  28 02/01/2021    BUN 33.0 (H) 02/01/2021     INR No results found for: INR, PROTIME     EKG   I have reviewed and interpreted the EKG.  The EKG is significant for***        I spent *** mins with the patient, >50% of the time was spent on education and counseling.      Renard Hamper, MD, MD, MS    [facility]

## 2021-09-09 ENCOUNTER — Ambulatory Visit
Admission: RE | Admit: 2021-09-09 | Discharge: 2021-09-09 | Disposition: A | Discharge status: Home / Self Care | Payer: Medicare Other | Source: Ambulatory Visit | Attending: Family | Admitting: Family

## 2021-09-09 DIAGNOSIS — S0990XD Unspecified injury of head, subsequent encounter: Secondary | ICD-10-CM | POA: Insufficient documentation

## 2021-09-23 ENCOUNTER — Ambulatory Visit (INDEPENDENT_AMBULATORY_CARE_PROVIDER_SITE_OTHER): Payer: Medicare Other | Admitting: Student in an Organized Health Care Education/Training Program

## 2021-10-01 ENCOUNTER — Emergency Department
Admission: EM | Admit: 2021-10-01 | Discharge: 2021-10-01 | Disposition: A | Discharge status: Home / Self Care | Payer: Medicare Other | Attending: Emergency Medical Services | Admitting: Emergency Medical Services

## 2021-10-01 ENCOUNTER — Emergency Department: Payer: Medicare Other

## 2021-10-01 DIAGNOSIS — K5732 Diverticulitis of large intestine without perforation or abscess without bleeding: Secondary | ICD-10-CM | POA: Insufficient documentation

## 2021-10-01 DIAGNOSIS — K5792 Diverticulitis of intestine, part unspecified, without perforation or abscess without bleeding: Secondary | ICD-10-CM

## 2021-10-01 LAB — COMPREHENSIVE METABOLIC PANEL
ALT: 18 U/L (ref 0–55)
AST (SGOT): 18 U/L (ref 5–41)
Albumin/Globulin Ratio: 1.1 (ref 0.9–2.2)
Albumin: 3.4 g/dL — ABNORMAL LOW (ref 3.5–5.0)
Alkaline Phosphatase: 77 U/L (ref 37–117)
Anion Gap: 6 (ref 5.0–15.0)
BUN: 22 mg/dL — ABNORMAL HIGH (ref 7.0–21.0)
Bilirubin, Total: 0.4 mg/dL (ref 0.2–1.2)
CO2: 27 mEq/L (ref 17–29)
Calcium: 9.8 mg/dL (ref 7.9–10.2)
Chloride: 105 mEq/L (ref 99–111)
Creatinine: 1.3 mg/dL — ABNORMAL HIGH (ref 0.4–1.0)
Globulin: 3.2 g/dL (ref 2.0–3.6)
Glucose: 134 mg/dL — ABNORMAL HIGH (ref 70–100)
Potassium: 4.4 mEq/L (ref 3.5–5.3)
Protein, Total: 6.6 g/dL (ref 6.0–8.3)
Sodium: 138 mEq/L (ref 135–145)
eGFR: 40.5 mL/min/{1.73_m2} — AB (ref >=60)

## 2021-10-01 LAB — URINALYSIS, REFLEX TO MICROSCOPIC EXAM IF INDICATED
Bilirubin, UA: NEGATIVE
Blood, UA: NEGATIVE
Glucose, UA: NEGATIVE
Ketones UA: NEGATIVE
Nitrite, UA: NEGATIVE
Protein, UR: NEGATIVE
Specific Gravity UA: 1.012 (ref 1.001–1.035)
Urine pH: 6 (ref 5.0–8.0)
Urobilinogen, UA: NEGATIVE mg/dL (ref 0.2–2.0)

## 2021-10-01 LAB — CBC AND DIFFERENTIAL
Absolute NRBC: 0 10*3/uL (ref 0.00–0.00)
Basophils Absolute Automated: 0.03 10*3/uL (ref 0.00–0.08)
Basophils Automated: 0.5 %
Eosinophils Absolute Automated: 0.4 10*3/uL (ref 0.00–0.44)
Eosinophils Automated: 6.8 %
Hematocrit: 30 % — ABNORMAL LOW (ref 34.7–43.7)
Hgb: 9.9 g/dL — ABNORMAL LOW (ref 11.4–14.8)
Immature Granulocytes Absolute: 0.01 10*3/uL (ref 0.00–0.07)
Immature Granulocytes: 0.2 %
Instrument Absolute Neutrophil Count: 3.35 10*3/uL (ref 1.10–6.33)
Lymphocytes Absolute Automated: 1.69 10*3/uL (ref 0.42–3.22)
Lymphocytes Automated: 28.7 %
MCH: 32.7 pg (ref 25.1–33.5)
MCHC: 33 g/dL (ref 31.5–35.8)
MCV: 99 fL — ABNORMAL HIGH (ref 78.0–96.0)
MPV: 9.9 fL (ref 8.9–12.5)
Monocytes Absolute Automated: 0.4 10*3/uL (ref 0.21–0.85)
Monocytes: 6.8 %
Neutrophils Absolute: 3.35 10*3/uL (ref 1.10–6.33)
Neutrophils: 57 %
Nucleated RBC: 0 /100 WBC (ref 0.0–0.0)
Platelets: 176 10*3/uL (ref 142–346)
RBC: 3.03 10*6/uL — ABNORMAL LOW (ref 3.90–5.10)
RDW: 13 % (ref 11–15)
WBC: 5.88 10*3/uL (ref 3.10–9.50)

## 2021-10-01 LAB — LIPASE: Lipase: 29 U/L (ref 8–78)

## 2021-10-01 MED ORDER — AMOXICILLIN-POT CLAVULANATE 875-125 MG PO TABS
1.0000 | ORAL_TABLET | Freq: Two times a day (BID) | ORAL | 0 refills | Status: DC
Start: 2021-10-01 — End: 2021-10-01

## 2021-10-01 MED ORDER — MORPHINE SULFATE 4 MG/ML IJ/IV SOLN (WRAP)
4.0000 mg | Freq: Once | Status: AC
Start: 2021-10-01 — End: 2021-10-01
  Administered 2021-10-01: 4 mg via INTRAVENOUS
  Filled 2021-10-01: qty 1

## 2021-10-01 MED ORDER — AMOXICILLIN-POT CLAVULANATE 875-125 MG PO TABS
1.0000 | ORAL_TABLET | Freq: Two times a day (BID) | ORAL | 0 refills | Status: AC
Start: 2021-10-01 — End: 2021-10-08

## 2021-10-01 MED ORDER — SODIUM CHLORIDE 0.9 % IV BOLUS
500.0000 mL | Freq: Once | INTRAVENOUS | Status: AC
Start: 2021-10-01 — End: 2021-10-01
  Administered 2021-10-01: 500 mL via INTRAVENOUS

## 2021-10-01 MED ORDER — STERILE WATER FOR INJECTION IJ/IV SOLN (WRAP)
4.5000 g | Freq: Once | INTRAVENOUS | Status: AC
Start: 2021-10-01 — End: 2021-10-01
  Administered 2021-10-01: 4.5 g via INTRAVENOUS
  Filled 2021-10-01: qty 20

## 2021-10-01 MED ORDER — KETOROLAC TROMETHAMINE 30 MG/ML IJ SOLN
30.0000 mg | Freq: Once | INTRAMUSCULAR | Status: AC
Start: 2021-10-01 — End: 2021-10-01
  Administered 2021-10-01: 30 mg via INTRAVENOUS
  Filled 2021-10-01: qty 1

## 2021-10-01 NOTE — ED Notes (Signed)
Bed: E08  Expected date:   Expected time:   Means of arrival:   Comments:  409

## 2021-10-01 NOTE — ED Provider Notes (Signed)
EMERGENCY DEPARTMENT NOTE     Patient initially seen and examined at   ED PHYSICIAN ASSIGNED       Date/Time Event User Comments    10/01/21 1118 Physician Assigned Emilie Rutter, MD assigned as Attending       HISTORY OF PRESENT ILLNESS   Patient Name: Vanessa Kelly   Medical Record Number: [id number]  AGE: 81 y.o.  DOB: 06-Dec-1940    Chief Complaint: Groin Pain and Back Pain       Vanessa Kelly is a 81 y.o. female brought in by EMS from assisted living has been having abdominal pain for 2 weeks is now radiating to right lower quadrant.  Patient denies any nausea vomiting diarrhea.    Independent Historian (other than patient):   Additional History Provided by Independent Historian:  MEDICAL HISTORY     Past Medical History:  Past Medical History:   Diagnosis Date    Diabetes mellitus     Gastroesophageal reflux disease     Hyperlipidemia     Hypertension     Seasonal allergic rhinitis        Past Surgical History:  Past Surgical History:   Procedure Laterality Date    BREAST BIOPSY Right     2014    HYSTERECTOMY      [phone]       Social History:  Social History     Socioeconomic History    Marital status: Married   Tobacco Use    Smoking status: Former     Types: Cigarettes     Quit date: 1990     Years since quitting: 33.4    Smokeless tobacco: Never   Vaping Use    Vaping status: Never Used   Substance and Sexual Activity    Alcohol use: Yes     Comment: occasionally    Drug use: Never       Family History:  Family History   Problem Relation Age of Onset    Heart failure Mother     Myocardial Infarction Father     Breast cancer Sister        Outpatient Medication:  Discharge Medication List as of 10/01/2021  3:42 PM        CONTINUE these medications which have NOT CHANGED    Details   !! candesartan (ATACAND) 4 MG tablet Take 1 tablet (4 mg) by mouth nightly, Historical Med      acetaminophen (TYLENOL) 325 MG tablet Take 650 mg by mouth every 6 (six) hours as needed for Pain (or  headache), Historical Med      aspirin EC 81 MG EC tablet Take 81 mg by mouth daily, Historical Med      calcium carbonate (TUMS) 500 MG chewable tablet Chew 1 tablet by mouth every 6 (six) hours as needed for Heartburn, Historical Med      !! candesartan (ATACAND) 4 MG tablet Take 4 mg by mouth daily as needed (SBP > 160), Historical Med      docusate sodium (COLACE) 100 MG capsule Take 1 capsule (100 mg total) by mouth daily, Starting Wed 02/03/2021, No Print      Dulaglutide (Trulicity) 0.75 MG/0.5ML Solution Pen-injector Inject 0.75 mg into the skin Once each week on Tuesday morning, Historical Med      ergocalciferol (ERGOCALCIFEROL) 1.25 MG (50000 UT) capsule Take 50,000 Units by mouth once a week On Saturday, Historical Med      fexofenadine (ALLEGRA) 180 MG tablet Take  180 mg by mouth daily, Historical Med      fludrocortisone (FLORINEF) 0.1 MG tablet Take 1 tablet (0.1 mg total) by mouth daily, Starting Wed 02/03/2021, No Print      fluticasone (Flonase Sensimist) 27.5 MCG/SPRAY nasal spray 1 spray by Nasal route daily, Historical Med      lidocaine (LIDODERM) 5 % Place 1 patch onto the skin every 24 hours Remove & Discard patch within 12 hours or as directed by MD, Historical Med      lovastatin (MEVACOR) 40 MG tablet Take 40 mg by mouth nightly, Historical Med      midodrine (PROAMATINE) 10 MG tablet Take 1 tablet (10 mg total) by mouth 3 (three) times daily with meals, Starting Tue 02/02/2021, No Print      Multiple Vitamins-Minerals (Centrum Silver 50+Women) Tab Take 1 tablet by mouth daily, Historical Med      pantoprazole (PROTONIX) 40 MG tablet Take 40 mg by mouth every morning before breakfast, Historical Med      polyethylene glycol (MIRALAX) 17 g packet Take 17 g by mouth daily, Starting Wed 02/03/2021, No Print      ranolazine (RANEXA) 500 MG 12 hr tablet Take 500 mg by mouth 2 (two) times daily, Historical Med      traMADol (ULTRAM) 50 MG tablet Take 50 mg by mouth every 8 (eight) hours as needed  for Pain, Historical Med      venlafaxine (EFFEXOR-XR) 37.5 MG 24 hr capsule Take 75 mg by mouth nightly, Historical Med      zolpidem (AMBIEN) 5 MG tablet Take 5 mg by mouth nightly, Historical Med       !! - Potential duplicate medications found. Please discuss with provider.          REVIEW OF SYSTEMS     Review of System other than what's stated in HPI is negative. See History of Present Illness.    PHYSICAL EXAM     ED Triage Vitals   Enc Vitals Group      BP       Pulse       Resp       Temp       Temp src       SpO2       Weight       Height       Head Circumference       Peak Flow       Pain Score       Pain Loc       Pain Edu?       Excl. in GC?        Constitutional: Resting comfortably no acute distress  Head: Normocephalic, atraumatic.  Eyes: Pupils equal, round,reactive to light.  ZOX:WRUEA oropharynx.  No stridor.  Neck: Supple.  Without meningismus.  Respiratory: Lungs clear to auscultation bilaterally.  No wheeze.  Cardiovascular: Regular rate and rhythm.  No rub.  Abdomen: Right lower abdominal tenderness, Soft, non-distended. No rigidity, no rebound.  Extremities: Well-perfused.  No significant edema.  Skin: Warm and dry.  No significant jaundice.  Neurologic: Alert and oriented.  Normal cranial nerve, sensory and motor exam.  Back: Normal on inspection. No midline point tenderness or stepoffs.  Psych: Normal affect, mood congruent    CARDIAC STUDIES      The following cardiac studies were independently interpreted by me the Emergency Medicine Provider.  For full cardiac study results please see chart.    Monitor Strip:  Sinus rhythm at 80 BPM with no ectopy.       EMERGENCY IMAGING STUDIES    The following imagine studies were independently interpreted by me (emergency physician):    RADIOLOGY IMAGING STUDIES      CT Abdomen Pelvis WO Contrast   Final Result   Addendum (preliminary) 1 of 1   Review of the study demonstrates several diverticula in the cecum with    mild stranding consistent with a  mild diverticulitis.   Degenerated disc at L5-S1 with endplate sclerosis, vacuum disc changes and    facet sclerosis.      Results were discussed with the referring physician      Kinnie Feil, MD   10/01/2021 2:40 PM      Final      1. No evidence of acute intra-abdominal pathology on this limited study performed without oral or intravenous contrast.    2.  Large hiatal hernia.      Fonnie Mu, DO   10/01/2021 12:56 PM          The following imagine studies were independently interpreted by me (emergency medicine provider):    PULSE OXIMETRY    Oxygen Saturation by Pulse Oximetry: 96%  Interventions: none  Interpretation:  Adequate    EMERGENCY DEPT. MEDICATIONS      ED Medication Orders (From admission, onward)      Start Ordered     Status Ordering Provider    10/01/21 1522 10/01/21 1521  ketorolac (TORADOL) injection 30 mg  Once        Route: Intravenous  Ordered Dose: 30 mg       Last MAR action: Given Nevelyn Mellott    10/01/21 1450 10/01/21 1449  piperacillin-tazobactam (ZOSYN) 4.5 g in sterile water (preservative free) 20 mL IV push injection  Once        Route: Intravenous  Ordered Dose: 4.5 g       Last MAR action: Given Taletha Twiford, [facility]    10/01/21 1321 10/01/21 1320  sodium chloride 0.9 % bolus 500 mL  Once        Route: Intravenous  Ordered Dose: 500 mL       Last MAR action: Stopped Bryston Colocho, [facility]    10/01/21 1202 10/01/21 1201  morphine injection 4 mg  Once        Route: Intravenous  Ordered Dose: 4 mg       Last MAR action: Given Hazem Kenner            LABORATORY RESULTS    Ordered and independently interpreted AVAILABLE laboratory tests. Please see results section in chart for full details.  Results for orders placed or performed during the hospital encounter of 10/01/21   CBC and differential   Result Value Ref Range    WBC 5.88 3.10 - 9.50 x10 3/uL    Hgb 9.9 (L) 11.4 - 14.8 g/dL    Hematocrit 16.1 (L) 34.7 - 43.7 %    Platelets 176 142 - 346 x10 3/uL    RBC 3.03 (L) 3.90 - 5.10 x10 6/uL    MCV  99.0 (H) 78.0 - 96.0 fL    MCH 32.7 25.1 - 33.5 pg    MCHC 33.0 31.5 - 35.8 g/dL    RDW 13 11 - 15 %    MPV 9.9 8.9 - 12.5 fL    Instrument Absolute Neutrophil Count 3.35 1.10 - 6.33 x10 3/uL    Neutrophils 57.0 None %    Lymphocytes Automated  28.7 None %    Monocytes 6.8 None %    Eosinophils Automated 6.8 None %    Basophils Automated 0.5 None %    Immature Granulocytes 0.2 None %    Nucleated RBC 0.0 0.0 - 0.0 /100 WBC    Neutrophils Absolute 3.35 1.10 - 6.33 x10 3/uL    Lymphocytes Absolute Automated 1.69 0.42 - 3.22 x10 3/uL    Monocytes Absolute Automated 0.40 0.21 - 0.85 x10 3/uL    Eosinophils Absolute Automated 0.40 0.00 - 0.44 x10 3/uL    Basophils Absolute Automated 0.03 0.00 - 0.08 x10 3/uL    Immature Granulocytes Absolute 0.01 0.00 - 0.07 x10 3/uL    Absolute NRBC 0.00 0.00 - 0.00 x10 3/uL   Comprehensive metabolic panel   Result Value Ref Range    Glucose 134 (H) 70 - 100 mg/dL    BUN 16.122.0 (H) 7.0 - 09.621.0 mg/dL    Creatinine 1.3 (H) 0.4 - 1.0 mg/dL    Sodium 045138 409135 - 811145 mEq/L    Potassium 4.4 3.5 - 5.3 mEq/L    Chloride 105 99 - 111 mEq/L    CO2 27 17 - 29 mEq/L    Calcium 9.8 7.9 - 10.2 mg/dL    Protein, Total 6.6 6.0 - 8.3 g/dL    Albumin 3.4 (L) 3.5 - 5.0 g/dL    AST (SGOT) 18 5 - 41 U/L    ALT 18 0 - 55 U/L    Alkaline Phosphatase 77 37 - 117 U/L    Bilirubin, Total 0.4 0.2 - 1.2 mg/dL    Globulin 3.2 2.0 - 3.6 g/dL    Albumin/Globulin Ratio 1.1 0.9 - 2.2    Anion Gap 6.0 5.0 - 15.0    eGFR 40.5 (A) >=60 mL/min/1.73 m2   Urinalysis Reflex to Microscopic Exam   Result Value Ref Range    Urine Type Clean Catch     Color, UA Yellow Colorless - Yellow    Clarity, UA Clear Clear - Hazy    Specific Gravity UA 1.012 1.001 - 1.035    Urine pH 6.0 5.0 - 8.0    Leukocyte Esterase, UA Trace (A) Negative    Nitrite, UA Negative Negative    Protein, UR Negative Negative    Glucose, UA Negative Negative    Ketones UA Negative Negative    Urobilinogen, UA Negative 0.2 - 2.0 mg/dL    Bilirubin, UA Negative  Negative    Blood, UA Negative Negative    WBC, UA 0 - 5 0 - 5 /hpf    Squamous Epithelial Cells, Urine 0 - 5 0 - 25 /hpf   Lipase   Result Value Ref Range    Lipase 29 8 - 78 U/L       PROCEDURES    Procedures        MEDICAL DECISION MAKING     Based on my initial evaluations orders written.      Patient initially treated with:   Medications   morphine injection 4 mg (4 mg Intravenous Given 10/01/21 1256)   sodium chloride 0.9 % bolus 500 mL (0 mLs Intravenous Stopped 10/01/21 1544)   piperacillin-tazobactam (ZOSYN) 4.5 g in sterile water (preservative free) 20 mL IV push injection (4.5 g Intravenous Given 10/01/21 1504)   ketorolac (TORADOL) injection 30 mg (30 mg Intravenous Given 10/01/21 1542)           PRIMARY PROBLEM LIST      Acute illness/injury with risk  to life or bodily function (based on differential diagnosis or evaluation) DIAGNOSIS: Abdominal pain     Differential Diagnosis: Abdominal Pain: bowel obstruction, abscess, colitis, diverticulitis, appendicitis, peritonitis, bowel perforation, mesenteric ischemia, abdominal aortic aneurysm, cholecystitis, biliay colic, cholangitis   Each of the differential diagnosis has been considered for diagnosis and weighed risk and benefit of further testing and evaluation in the context of patient complaint. Some diagnosis do not warrant further testing including imaging and/or labs. However, all differential diagnosis have been considered.    DISCUSSION      81 year old female presents to the emergency room for abdominal pain.  CT abdomen revealed patient has a little right lower quadrant diverticulitis.  Patient was treated with Zosyn.  Patient was sent home on Augmentin.    Patient continues to have right lower abdominal tenderness.     The emergency provider has discussed at length with patient regarding symptoms and diagnostics findings, in addition to providing specific details for the plan of care.             Based on the patient's improved condition, decision made  with patient for discharge. Plan discussed with the patient, and all questions and concerns addressed accordingly. Appropriate return precautions explained, patient states a clear understanding of the plan provided and is comfortable and agreeable for discharge.    If patient is being hospitalized is severe sepsis or septic shock suspected?: N/A  Was management discussed with a consultant?: N/A  External Records Reviewed?: Physician Office Records  Past medical records found on epic reviewed.     Vital Signs: Reviewed the patient's vital signs.   Nursing Notes: Reviewed and utilized available nursing notes.  Medical Records Reviewed: Reviewed available past medical records.  Counseling: The emergency provider has spoken with the patient and discussed today's findings, in addition to providing specific details for the plan of care.  Questions are answered and there is agreement with the plan.      Additional Notes                  MIPS DOCUMENTATION              Prescriptions:  Discharge Medication List as of 10/01/2021  3:42 PM        START taking these medications    Details   amoxicillin-clavulanate (AUGMENTIN) 875-125 MG per tablet Take 1 tablet by mouth 2 (two) times daily for 7 days, Starting Fri 10/01/2021, Until Fri 10/08/2021, Print           CONTINUE these medications which have NOT CHANGED    Details   !! candesartan (ATACAND) 4 MG tablet Take 1 tablet (4 mg) by mouth nightly, Historical Med      acetaminophen (TYLENOL) 325 MG tablet Take 650 mg by mouth every 6 (six) hours as needed for Pain (or headache), Historical Med      aspirin EC 81 MG EC tablet Take 81 mg by mouth daily, Historical Med      calcium carbonate (TUMS) 500 MG chewable tablet Chew 1 tablet by mouth every 6 (six) hours as needed for Heartburn, Historical Med      !! candesartan (ATACAND) 4 MG tablet Take 4 mg by mouth daily as needed (SBP > 160), Historical Med      docusate sodium (COLACE) 100 MG capsule Take 1 capsule (100 mg total) by  mouth daily, Starting Wed 02/03/2021, No Print      Dulaglutide (Trulicity) 0.75 MG/0.5ML Solution Pen-injector Inject 0.75 mg  into the skin Once each week on Tuesday morning, Historical Med      ergocalciferol (ERGOCALCIFEROL) 1.25 MG (50000 UT) capsule Take 50,000 Units by mouth once a week On Saturday, Historical Med      fexofenadine (ALLEGRA) 180 MG tablet Take 180 mg by mouth daily, Historical Med      fludrocortisone (FLORINEF) 0.1 MG tablet Take 1 tablet (0.1 mg total) by mouth daily, Starting Wed 02/03/2021, No Print      fluticasone (Flonase Sensimist) 27.5 MCG/SPRAY nasal spray 1 spray by Nasal route daily, Historical Med      lidocaine (LIDODERM) 5 % Place 1 patch onto the skin every 24 hours Remove & Discard patch within 12 hours or as directed by MD, Historical Med      lovastatin (MEVACOR) 40 MG tablet Take 40 mg by mouth nightly, Historical Med      midodrine (PROAMATINE) 10 MG tablet Take 1 tablet (10 mg total) by mouth 3 (three) times daily with meals, Starting Tue 02/02/2021, No Print      Multiple Vitamins-Minerals (Centrum Silver 50+Women) Tab Take 1 tablet by mouth daily, Historical Med      pantoprazole (PROTONIX) 40 MG tablet Take 40 mg by mouth every morning before breakfast, Historical Med      polyethylene glycol (MIRALAX) 17 g packet Take 17 g by mouth daily, Starting Wed 02/03/2021, No Print      ranolazine (RANEXA) 500 MG 12 hr tablet Take 500 mg by mouth 2 (two) times daily, Historical Med      traMADol (ULTRAM) 50 MG tablet Take 50 mg by mouth every 8 (eight) hours as needed for Pain, Historical Med      venlafaxine (EFFEXOR-XR) 37.5 MG 24 hr capsule Take 75 mg by mouth nightly, Historical Med      zolpidem (AMBIEN) 5 MG tablet Take 5 mg by mouth nightly, Historical Med       !! - Potential duplicate medications found. Please discuss with provider.          DIAGNOSIS      Diagnosis:  Final diagnoses:   Diverticulitis       Disposition:  ED Disposition       ED Disposition   Discharge     Condition   --    Date/Time   Fri Oct 01, 2021  3:41 PM    Comment   [name] discharge to home/self care.    Condition at disposition: Stable                 This note was generated by the Epic EMR system/Dragon speech recognition and may contain inherent errors or omissions not intended by the user. Grammatical errors, random word insertions, deletions, pronoun errors and incomplete sentences are occasional consequences of this technology due to software limitations. Not all errors are caught or corrected. If there are questions or concerns about the content of this note or information contained within the body of this dictation they should be addressed directly with the author for clarification.     Macky Lower, MD  10/03/21 2317

## 2021-10-14 ENCOUNTER — Emergency Department: Payer: Medicare Other

## 2021-10-14 ENCOUNTER — Emergency Department
Admission: EM | Admit: 2021-10-14 | Discharge: 2021-10-14 | Disposition: A | Discharge status: Home / Self Care | Payer: Medicare Other | Attending: Emergency Medicine | Admitting: Emergency Medicine

## 2021-10-14 DIAGNOSIS — W03XXXA Other fall on same level due to collision with another person, initial encounter: Secondary | ICD-10-CM | POA: Insufficient documentation

## 2021-10-14 DIAGNOSIS — S3792XA Contusion of unspecified urinary and pelvic organ, initial encounter: Secondary | ICD-10-CM | POA: Insufficient documentation

## 2021-10-14 DIAGNOSIS — Y9389 Activity, other specified: Secondary | ICD-10-CM | POA: Insufficient documentation

## 2021-10-14 DIAGNOSIS — S300XXA Contusion of lower back and pelvis, initial encounter: Secondary | ICD-10-CM

## 2021-10-14 DIAGNOSIS — S40012A Contusion of left shoulder, initial encounter: Secondary | ICD-10-CM | POA: Insufficient documentation

## 2021-10-14 DIAGNOSIS — Y92129 Unspecified place in nursing home as the place of occurrence of the external cause: Secondary | ICD-10-CM | POA: Insufficient documentation

## 2021-10-14 DIAGNOSIS — S0990XA Unspecified injury of head, initial encounter: Secondary | ICD-10-CM | POA: Insufficient documentation

## 2021-10-14 DIAGNOSIS — S161XXA Strain of muscle, fascia and tendon at neck level, initial encounter: Secondary | ICD-10-CM | POA: Insufficient documentation

## 2021-10-14 MED ORDER — METHOCARBAMOL 750 MG PO TABS
750.0000 mg | ORAL_TABLET | Freq: Three times a day (TID) | ORAL | 0 refills | Status: DC | PRN
Start: 2021-10-14 — End: 2021-10-14

## 2021-10-14 MED ORDER — MORPHINE SULFATE 4 MG/ML IJ/IV SOLN (WRAP)
4.0000 mg | Freq: Once | Status: AC
Start: 2021-10-14 — End: 2021-10-14
  Administered 2021-10-14: 4 mg via INTRAMUSCULAR

## 2021-10-14 MED ORDER — LIDOCAINE 5 % EX PTCH
1.0000 | MEDICATED_PATCH | CUTANEOUS | 0 refills | Status: AC
Start: 2021-10-14 — End: ?

## 2021-10-14 MED ORDER — LIDOCAINE 5 % EX PTCH
1.0000 | MEDICATED_PATCH | CUTANEOUS | 0 refills | Status: DC
Start: 2021-10-14 — End: 2021-10-14

## 2021-10-14 MED ORDER — METHOCARBAMOL 750 MG PO TABS
750.0000 mg | ORAL_TABLET | Freq: Three times a day (TID) | ORAL | 0 refills | Status: DC | PRN
Start: 2021-10-14 — End: 2022-06-29

## 2021-10-14 MED ORDER — KETOROLAC TROMETHAMINE 30 MG/ML IJ SOLN
30.0000 mg | Freq: Once | INTRAMUSCULAR | Status: AC
Start: 2021-10-14 — End: 2021-10-14
  Administered 2021-10-14: 30 mg via INTRAMUSCULAR
  Filled 2021-10-14: qty 1

## 2021-10-14 MED ORDER — TRAMADOL HCL 50 MG PO TABS
50.0000 mg | ORAL_TABLET | Freq: Four times a day (QID) | ORAL | 0 refills | Status: AC | PRN
Start: 2021-10-14 — End: 2021-10-21

## 2021-10-14 MED ORDER — LIDOCAINE 5 % EX PTCH
1.0000 | MEDICATED_PATCH | CUTANEOUS | Status: DC
Start: 2021-10-14 — End: 2021-10-14
  Administered 2021-10-14: 1 via TRANSDERMAL
  Filled 2021-10-14: qty 1

## 2021-10-14 MED ORDER — TRAMADOL HCL 50 MG PO TABS
50.0000 mg | ORAL_TABLET | Freq: Four times a day (QID) | ORAL | 0 refills | Status: DC | PRN
Start: 2021-10-14 — End: 2021-10-14

## 2021-10-14 MED ORDER — MORPHINE SULFATE 4 MG/ML IJ/IV SOLN (WRAP)
4.0000 mg | Freq: Once | Status: DC
Start: 2021-10-14 — End: 2021-10-14
  Filled 2021-10-14: qty 1

## 2021-10-14 NOTE — ED Provider Notes (Shared)
EMERGENCY DEPARTMENT NOTE     Patient initially seen and examined at   ED PHYSICIAN ASSIGNED       Date/Time Event User Comments    10/14/21 1134 Physician Assigned Candace Cruise, DO assigned as Attending           ED MIDLEVEL (APP) ASSIGNED       None            HISTORY OF PRESENT ILLNESS   {Translator Used (Optional):59393}    Chief Complaint: Fall       81 y.o. female with past medical history as below presents to the ED via EMS for evaluation of fall.  States that she was at the nursing home and using her walker to ambulate when another resident in their wheelchair ran into her and she fell onto her left side.  States she hit her head but denies LOC.  She complains of headache, left neck left shoulder pain.  She complains of bilateral pelvic pain.  Denies chest pain or shortness of breath.  Denies numbness tingling or weakness of extremities.    Independent Historian (other than patient): EMS  Additional History Provided by Independent Historian:  MEDICAL HISTORY     Past Medical History:  Past Medical History:   Diagnosis Date    Diabetes mellitus     Gastroesophageal reflux disease     Hyperlipidemia     Hypertension     Seasonal allergic rhinitis        Past Surgical History:  Past Surgical History:   Procedure Laterality Date    BREAST BIOPSY Right     2014    HYSTERECTOMY      [phone]       Social History:  Social History     Socioeconomic History    Marital status: Married   Tobacco Use    Smoking status: Former     Types: Cigarettes     Quit date: 1990     Years since quitting: 33.4    Smokeless tobacco: Never   Vaping Use    Vaping status: Never Used   Substance and Sexual Activity    Alcohol use: Yes     Comment: occasionally    Drug use: Never       Family History:  Family History   Problem Relation Age of Onset    Heart failure Mother     Myocardial Infarction Father     Breast cancer Sister        Outpatient Medication:  Previous Medications    ACETAMINOPHEN (TYLENOL) 325 MG  TABLET    Take 650 mg by mouth every 6 (six) hours as needed for Pain (or headache)    ASPIRIN EC 81 MG EC TABLET    Take 81 mg by mouth daily    CALCIUM CARBONATE (TUMS) 500 MG CHEWABLE TABLET    Chew 1 tablet by mouth every 6 (six) hours as needed for Heartburn    CANDESARTAN (ATACAND) 4 MG TABLET    Take 1 tablet (4 mg) by mouth nightly    CANDESARTAN (ATACAND) 4 MG TABLET    Take 4 mg by mouth daily as needed (SBP > 160)    DOCUSATE SODIUM (COLACE) 100 MG CAPSULE    Take 1 capsule (100 mg total) by mouth daily    DULAGLUTIDE (TRULICITY) 0.75 MG/0.5ML SOLUTION PEN-INJECTOR    Inject 0.75 mg into the skin Once each week on Tuesday morning    ERGOCALCIFEROL (ERGOCALCIFEROL)  1.25 MG (50000 UT) CAPSULE    Take 50,000 Units by mouth once a week On Saturday    FEXOFENADINE (ALLEGRA) 180 MG TABLET    Take 180 mg by mouth daily    FLUDROCORTISONE (FLORINEF) 0.1 MG TABLET    Take 1 tablet (0.1 mg total) by mouth daily    FLUTICASONE (FLONASE SENSIMIST) 27.5 MCG/SPRAY NASAL SPRAY    1 spray by Nasal route daily    LIDOCAINE (LIDODERM) 5 %    Place 1 patch onto the skin every 24 hours Remove & Discard patch within 12 hours or as directed by MD    LOVASTATIN (MEVACOR) 40 MG TABLET    Take 40 mg by mouth nightly    MIDODRINE (PROAMATINE) 10 MG TABLET    Take 1 tablet (10 mg total) by mouth 3 (three) times daily with meals    MULTIPLE VITAMINS-MINERALS (CENTRUM SILVER 50+WOMEN) TAB    Take 1 tablet by mouth daily    PANTOPRAZOLE (PROTONIX) 40 MG TABLET    Take 40 mg by mouth every morning before breakfast    POLYETHYLENE GLYCOL (MIRALAX) 17 G PACKET    Take 17 g by mouth daily    RANOLAZINE (RANEXA) 500 MG 12 HR TABLET    Take 500 mg by mouth 2 (two) times daily    TRAMADOL (ULTRAM) 50 MG TABLET    Take 50 mg by mouth every 8 (eight) hours as needed for Pain    VENLAFAXINE (EFFEXOR-XR) 37.5 MG 24 HR CAPSULE    Take 75 mg by mouth nightly    ZOLPIDEM (AMBIEN) 5 MG TABLET    Take 5 mg by mouth nightly         REVIEW OF SYSTEMS    Review of Systems See History of Present Illness  PHYSICAL EXAM     ED Triage Vitals [10/14/21 1129]   Enc Vitals Group      BP 169/76      Heart Rate 68      Resp Rate 20      Temp 98.6 F (37 C)      Temp src       SpO2 99 %      Weight 85 kg      Height 1.753 m      Head Circumference       Peak Flow       Pain Score 7      Pain Loc       Pain Edu?       Excl. in GC?      Physical Exam   Nursing note and vitals reviewed.  Constitutional:  Well developed, well nourished. Awake & Oriented x3.  Head:  Atraumatic. Normocephalic.    Eyes:  PERRL. EOMI. Conjunctivae are not pale.  ENT:  Mucous membranes are moist and intact. Oropharynx is clear and symmetric.  Patent airway.  Neck:  Supple. Full ROM.  no c-spine tenderness.  + left paraspinal tenderness  Cardiovascular:  Regular rate. Regular rhythm. No murmurs, rubs, or gallops.  Pulmonary/Chest:  No evidence of respiratory distress. Clear to auscultation bilaterally.  No wheezing, rales or rhonchi.   Abdominal:  Soft and non-distended. There is no tenderness. No rebound, guarding, or rigidity.  Back:  Full ROM. No spinal tenderness   Extremities:  No edema. No cyanosis. No clubbing. Full range of motion in all extremities.  + tenderness to palpation left shoulder. Pain with rom left shoulder. + bilateral posterior hip tenderness. Pain with  extension bilateral lower extremities.   Skin:  Skin is warm and dry.  No diaphoresis. No rash.   Neurological:  Alert, awake, and appropriate. Normal speech. Motor normal.  Psychiatric:  Good eye contact. Normal interaction, affect, and behavior.    MEDICAL DECISION MAKING   Ct head to r/o sdh  Ct c-spine to r/o fx  Ct pelvis to r/o fx    Left shoulder xray to r/o fx or dislocation  PRIMARY PROBLEM LIST      {CEP ACUITY:59028} DIAGNOSIS:***  {Chronic Illness Impacting Care of the above problem:59030} {Explain (Optional):59078}  {Differential Diagnosis:59053}  DISCUSSION      ***    {If patient is being hospitalized is severe  sepsis or septic shock suspected?:59467}      {Was management discussed with a consultant?:59037}  {Was the decision around the need for surgery discussed with consultant:59056::"N/A"}  {External Records Reviewed?:59023}    Additional Notes    {Diagnostic test considered and not performed:59031::"N/A"}  {Prescription medications considered and not given:59033::"N/A"}  {Hospitalization considered but not done:59032::"N/A"}  {Social Determinants of Health Considerations:59036::"N/A"}  {Was there decision to not resuscitate or to de-escalate care due to poor prognosis?:59057}         Vital Signs: Reviewed the patient's vital signs.   Nursing Notes: Reviewed and utilized available nursing notes.  Medical Records Reviewed: Reviewed available past medical records.  Counseling: The emergency provider has spoken with the patient and discussed today's findings, in addition to providing specific details for the plan of care.  Questions are answered and there is agreement with the plan.      MIPS DOCUMENTATION    {ORAL ANTIBIOTICS given for otitis externa due to:59079}  {ACUTE BRONCHITIS Medical reasoning for giving this patient oral antibiotics for acute bronchitis are the following (Optional):59080}  {Medical reasoning for giving this patient oral antibiotics for acute sinusitis are the following (Optional):59081}  {HEAD TRAUMA Indications for head CT due to trauma (Optional):59082}    CARDIAC STUDIES    The following cardiac studies were independently interpreted by me the Emergency Medicine Provider.  For full cardiac study results please see chart.    {Monitor Strip Interpretation:59688}  {ZDGU:44034}  {Rhythm:59687}  {ST segments:59689}    {EKG interpretation:59684}  {Comparison:59859}  {Rate:59685}  {Rhythm:59687}  {ST segments:59689}  {EKG interpretation:59690}    {EKG interpretation:59684}  {Comparison:59859}  {Rate:59685}  {Rhythm:59687}  {ST segments:59689}  {EKG interpretation:59690}  EMERGENCY IMAGING STUDIES     The following imagine studies were independently interpreted by me (emergency medicine provider):    {Xray interpreted by ED provider? (Optional):59468} {SIDE (Optional):59475}  {Comparison:59859}  {RESULT:59469}  {IMPRESSION:59470}    {CT interpreted by provider? (Optional):59471}  {Comparison:59859}  {VQQVZD:63875}  {IMPRESSION:59474}  RADIOLOGY IMAGING STUDIES      CT Head WO Contrast   Final Result    No acute intracranial process.      Sandie Ano, MD   10/14/2021 1:07 PM      CT Cervical Spine without Contrast    (Results Pending)   CT Pelvis without Contrast    (Results Pending)   Shoulder Left 2+ Views    (Results Pending)       EMERGENCY DEPT. MEDICATIONS      ED Medication Orders (From admission, onward)      Start Ordered     Status Ordering Provider    10/14/21 1153 10/14/21 1152  ketorolac (TORADOL) injection 30 mg  Once        Route: Intramuscular  Ordered Dose: 30 mg  Last MAR action: Given Elie Goody G            LABORATORY RESULTS    Ordered and independently interpreted AVAILABLE laboratory tests.   Results       ** No results found for the last 24 hours. **              CRITICAL CARE/PROCEDURES    Procedures  ***Critical care?  DIAGNOSIS      Diagnosis:  Final diagnoses:   None       Disposition:  ED Disposition       None            Prescriptions:  Patient's Medications   New Prescriptions    No medications on file   Previous Medications    ACETAMINOPHEN (TYLENOL) 325 MG TABLET    Take 650 mg by mouth every 6 (six) hours as needed for Pain (or headache)    ASPIRIN EC 81 MG EC TABLET    Take 81 mg by mouth daily    CALCIUM CARBONATE (TUMS) 500 MG CHEWABLE TABLET    Chew 1 tablet by mouth every 6 (six) hours as needed for Heartburn    CANDESARTAN (ATACAND) 4 MG TABLET    Take 1 tablet (4 mg) by mouth nightly    CANDESARTAN (ATACAND) 4 MG TABLET    Take 4 mg by mouth daily as needed (SBP > 160)    DOCUSATE SODIUM (COLACE) 100 MG CAPSULE    Take 1 capsule (100 mg total) by mouth daily     DULAGLUTIDE (TRULICITY) 0.75 MG/0.5ML SOLUTION PEN-INJECTOR    Inject 0.75 mg into the skin Once each week on Tuesday morning    ERGOCALCIFEROL (ERGOCALCIFEROL) 1.25 MG (50000 UT) CAPSULE    Take 50,000 Units by mouth once a week On Saturday    FEXOFENADINE (ALLEGRA) 180 MG TABLET    Take 180 mg by mouth daily    FLUDROCORTISONE (FLORINEF) 0.1 MG TABLET    Take 1 tablet (0.1 mg total) by mouth daily    FLUTICASONE (FLONASE SENSIMIST) 27.5 MCG/SPRAY NASAL SPRAY    1 spray by Nasal route daily    LIDOCAINE (LIDODERM) 5 %    Place 1 patch onto the skin every 24 hours Remove & Discard patch within 12 hours or as directed by MD    LOVASTATIN (MEVACOR) 40 MG TABLET    Take 40 mg by mouth nightly    MIDODRINE (PROAMATINE) 10 MG TABLET    Take 1 tablet (10 mg total) by mouth 3 (three) times daily with meals    MULTIPLE VITAMINS-MINERALS (CENTRUM SILVER 50+WOMEN) TAB    Take 1 tablet by mouth daily    PANTOPRAZOLE (PROTONIX) 40 MG TABLET    Take 40 mg by mouth every morning before breakfast    POLYETHYLENE GLYCOL (MIRALAX) 17 G PACKET    Take 17 g by mouth daily    RANOLAZINE (RANEXA) 500 MG 12 HR TABLET    Take 500 mg by mouth 2 (two) times daily    TRAMADOL (ULTRAM) 50 MG TABLET    Take 50 mg by mouth every 8 (eight) hours as needed for Pain    VENLAFAXINE (EFFEXOR-XR) 37.5 MG 24 HR CAPSULE    Take 75 mg by mouth nightly    ZOLPIDEM (AMBIEN) 5 MG TABLET    Take 5 mg by mouth nightly   Modified Medications    No medications on file   Discontinued Medications    No medications on  file           This note was generated by the Epic EMR system/ Dragon speech recognition and may contain inherent errors or omissions not intended by the user. Grammatical errors, random word insertions, deletions and pronoun errors  are occasional consequences of this technology due to software limitations. Not all errors are caught or corrected. If there are questions or concerns about the content of this note or information contained within the  body of this dictation they should be addressed directly with the author for clarification.    {THIS REVIEW SECTION WILL AUTODELETE ONCE NOTE IS SIGNED    END REVIEW SECTION(Optional):55325}

## 2021-10-14 NOTE — ED Notes (Signed)
Bed: E02  Expected date:   Expected time:   Means of arrival:   Comments:  409

## 2021-10-19 ENCOUNTER — Ambulatory Visit (INDEPENDENT_AMBULATORY_CARE_PROVIDER_SITE_OTHER): Payer: Medicare Other | Admitting: Cardiovascular Disease

## 2022-01-10 ENCOUNTER — Ambulatory Visit (INDEPENDENT_AMBULATORY_CARE_PROVIDER_SITE_OTHER): Payer: Medicare Other | Admitting: Cardiovascular Disease

## 2022-01-18 ENCOUNTER — Emergency Department
Admission: EM | Admit: 2022-01-18 | Discharge: 2022-01-18 | Disposition: A | Discharge status: Home / Self Care | Payer: Medicare Other | Attending: Emergency Medicine | Admitting: Emergency Medicine

## 2022-01-18 ENCOUNTER — Emergency Department: Payer: Medicare Other

## 2022-01-18 DIAGNOSIS — R1011 Right upper quadrant pain: Secondary | ICD-10-CM | POA: Insufficient documentation

## 2022-01-18 LAB — URINALYSIS REFLEX TO MICROSCOPIC EXAM - REFLEX TO CULTURE
Bilirubin, UA: NEGATIVE
Blood, UA: NEGATIVE
Glucose, UA: NEGATIVE
Ketones UA: NEGATIVE
Leukocyte Esterase, UA: NEGATIVE
Nitrite, UA: NEGATIVE
Protein, UR: NEGATIVE
Specific Gravity UA: 1.012 (ref 1.001–1.035)
Urine pH: 6 (ref 5.0–8.0)
Urobilinogen, UA: NEGATIVE mg/dL (ref 0.2–2.0)

## 2022-01-18 LAB — COMPREHENSIVE METABOLIC PANEL
ALT: 20 U/L (ref 0–55)
AST (SGOT): 17 U/L (ref 5–41)
Albumin/Globulin Ratio: 1.1 (ref 0.9–2.2)
Albumin: 3.9 g/dL (ref 3.5–5.0)
Alkaline Phosphatase: 96 U/L (ref 37–117)
Anion Gap: 8 (ref 5.0–15.0)
BUN: 18 mg/dL (ref 7.0–21.0)
Bilirubin, Total: 0.4 mg/dL (ref 0.2–1.2)
CO2: 23 mEq/L (ref 17–29)
Calcium: 10.6 mg/dL — ABNORMAL HIGH (ref 7.9–10.2)
Chloride: 106 mEq/L (ref 99–111)
Creatinine: 1.3 mg/dL — ABNORMAL HIGH (ref 0.4–1.0)
Globulin: 3.6 g/dL (ref 2.0–3.6)
Glucose: 98 mg/dL (ref 70–100)
Potassium: 4.8 mEq/L (ref 3.5–5.3)
Protein, Total: 7.5 g/dL (ref 6.0–8.3)
Sodium: 137 mEq/L (ref 135–145)
eGFR: 40.6 mL/min/{1.73_m2} — AB (ref >=60)

## 2022-01-18 LAB — CBC AND DIFFERENTIAL
Absolute NRBC: 0 10*3/uL (ref 0.00–0.00)
Basophils Absolute Automated: 0.03 10*3/uL (ref 0.00–0.08)
Basophils Automated: 0.5 %
Eosinophils Absolute Automated: 0.37 10*3/uL (ref 0.00–0.44)
Eosinophils Automated: 5.7 %
Hematocrit: 34.5 % — ABNORMAL LOW (ref 34.7–43.7)
Hgb: 11.5 g/dL (ref 11.4–14.8)
Immature Granulocytes Absolute: 0.02 10*3/uL (ref 0.00–0.07)
Immature Granulocytes: 0.3 %
Instrument Absolute Neutrophil Count: 3.7 10*3/uL (ref 1.10–6.33)
Lymphocytes Absolute Automated: 1.93 10*3/uL (ref 0.42–3.22)
Lymphocytes Automated: 29.6 %
MCH: 33 pg (ref 25.1–33.5)
MCHC: 33.3 g/dL (ref 31.5–35.8)
MCV: 99.1 fL — ABNORMAL HIGH (ref 78.0–96.0)
MPV: 9.9 fL (ref 8.9–12.5)
Monocytes Absolute Automated: 0.46 10*3/uL (ref 0.21–0.85)
Monocytes: 7.1 %
Neutrophils Absolute: 3.7 10*3/uL (ref 1.10–6.33)
Neutrophils: 56.8 %
Nucleated RBC: 0 /100 WBC (ref 0.0–0.0)
Platelets: 193 10*3/uL (ref 142–346)
RBC: 3.48 10*6/uL — ABNORMAL LOW (ref 3.90–5.10)
RDW: 13 % (ref 11–15)
WBC: 6.51 10*3/uL (ref 3.10–9.50)

## 2022-01-18 LAB — ECG 12-LEAD
Atrial Rate: 55 {beats}/min
P Axis: 54 degrees
P-R Interval: 224 ms
Q-T Interval: 400 ms
QRS Duration: 82 ms
QTC Calculation (Bezet): 382 ms
R Axis: -24 degrees
T Axis: 34 degrees
Ventricular Rate: 55 {beats}/min

## 2022-01-18 LAB — LIPASE: Lipase: 17 U/L (ref 8–78)

## 2022-01-18 LAB — HIGH SENSITIVITY TROPONIN-I: hs Troponin-I: <2.7 ng/L

## 2022-01-18 MED ORDER — SUCRALFATE 1 G PO TABS
1.0000 g | ORAL_TABLET | Freq: Four times a day (QID) | ORAL | 0 refills | Status: AC | PRN
Start: 2022-01-18 — End: ?

## 2022-01-18 MED ORDER — SUCRALFATE 1 G PO TABS
1.0000 g | ORAL_TABLET | Freq: Four times a day (QID) | ORAL | 0 refills | Status: DC | PRN
Start: 2022-01-18 — End: 2022-01-18

## 2022-01-18 MED ORDER — SODIUM CHLORIDE 0.9 % IV BOLUS
1000.0000 mL | Freq: Once | INTRAVENOUS | Status: AC
Start: 2022-01-18 — End: 2022-01-18
  Administered 2022-01-18: 1000 mL via INTRAVENOUS

## 2022-01-18 MED ORDER — ONDANSETRON HCL 4 MG/2ML IJ SOLN
4.0000 mg | Freq: Once | INTRAMUSCULAR | Status: AC | PRN
Start: 2022-01-18 — End: 2022-01-18
  Administered 2022-01-18: 4 mg via INTRAVENOUS
  Filled 2022-01-18: qty 2

## 2022-01-18 MED ORDER — FAMOTIDINE 10 MG/ML IV SOLN (WRAP)
20.0000 mg | Freq: Once | INTRAVENOUS | Status: AC
Start: 2022-01-18 — End: 2022-01-18
  Administered 2022-01-18: 20 mg via INTRAVENOUS
  Filled 2022-01-18: qty 2

## 2022-01-18 NOTE — Discharge Instructions (Signed)
We will add Carafate as needed for abdominal discomfort.  Eat a bland diet avoiding spicy and acidic foods for the next 1 week.  Please follow-up with your primary care doctor in 2 to 3 days.  As we discussed, symptoms can change and get worse.  Return to the emergency department for worsening abdominal pain, fevers, vomiting, chest pain, shortness of breath, worsening symptoms, or any other concerns.

## 2022-01-18 NOTE — ED Provider Notes (Signed)
EMERGENCY DEPARTMENT NOTE     Patient initially seen and examined at   ED PHYSICIAN ASSIGNED       Date/Time Event User Comments    01/18/22 1405 Physician Assigned Perrion Diesel A Jcion Buddenhagen, Antonietta Barcelona, MD assigned as Attending           ED MIDLEVEL (APP) ASSIGNED       None            HISTORY OF PRESENT ILLNESS   Translator Used : No    Chief Complaint: Abdominal Pain     81 y.o. female with past medical history GERD, diabetes, hypertension, hyperlipidemia presents with complaint of abdominal pain.  States for the past 4 to 5 days has had intermittent abdominal pain.  She describes it as right upper quadrant abdominal pain, sharp, stabbing, intermittent lasting a few minutes at a time, nonradiating, no clear aggravating or alleviating factors.  Says she feels like there is a tender ball under her right rib cage.  She has had decreased appetite however has been able to tolerate fluids.  Her urine has been darker than normal denies dysuria or frequency.  Otherwise feels well.  No fevers or chills.  No productive cough, chest pain or shortness of breath.  No vomiting or diarrhea.  She has not had pain like this in the past.    Independent Historian (other than patient): Family (list in HPI) daughter  Additional History Provided by Independent Historian:  MEDICAL HISTORY     Past Medical History:  Past Medical History:   Diagnosis Date    Diabetes mellitus     Gastroesophageal reflux disease     Hyperlipidemia     Hypertension     Seasonal allergic rhinitis        Past Surgical History:  Past Surgical History:   Procedure Laterality Date    BREAST BIOPSY Right     2014    HYSTERECTOMY      [phone]       Social History:  Social History     Socioeconomic History    Marital status: Married   Tobacco Use    Smoking status: Former     Types: Cigarettes     Quit date: 1990     Years since quitting: 33.7    Smokeless tobacco: Never   Vaping Use    Vaping Use: Never used   Substance and Sexual Activity    Alcohol use: Yes      Comment: occasionally    Drug use: Never       Family History:  Family History   Problem Relation Age of Onset    Heart failure Mother     Myocardial Infarction Father     Breast cancer Sister        Outpatient Medication:  Previous Medications    ACETAMINOPHEN (TYLENOL) 325 MG TABLET    Take 650 mg by mouth every 6 (six) hours as needed for Pain (or headache)    ASPIRIN EC 81 MG EC TABLET    Take 81 mg by mouth daily    CALCIUM CARBONATE (TUMS) 500 MG CHEWABLE TABLET    Chew 1 tablet by mouth every 6 (six) hours as needed for Heartburn    CANDESARTAN (ATACAND) 4 MG TABLET    Take 1 tablet (4 mg) by mouth nightly    CANDESARTAN (ATACAND) 4 MG TABLET    Take 4 mg by mouth daily as needed (SBP > 160)    DOCUSATE SODIUM (  COLACE) 100 MG CAPSULE    Take 1 capsule (100 mg total) by mouth daily    DULAGLUTIDE (TRULICITY) 0.75 MG/0.5ML SOLUTION PEN-INJECTOR    Inject 0.75 mg into the skin Once each week on Tuesday morning    ERGOCALCIFEROL (ERGOCALCIFEROL) 1.25 MG (50000 UT) CAPSULE    Take 50,000 Units by mouth once a week On Saturday    FEXOFENADINE (ALLEGRA) 180 MG TABLET    Take 180 mg by mouth daily    FLUDROCORTISONE (FLORINEF) 0.1 MG TABLET    Take 1 tablet (0.1 mg total) by mouth daily    FLUTICASONE (FLONASE SENSIMIST) 27.5 MCG/SPRAY NASAL SPRAY    1 spray by Nasal route daily    LIDOCAINE (LIDODERM) 5 %    Place 1 patch onto the skin every 24 hours Remove & Discard patch within 12 hours or as directed by MD    LIDOCAINE (LIDODERM) 5 %    Place 1 patch onto the skin every 24 hours Remove & Discard patch within 12 hours or as directed by MD    LOVASTATIN (MEVACOR) 40 MG TABLET    Take 40 mg by mouth nightly    METHOCARBAMOL (ROBAXIN) 750 MG TABLET    Take 1 tablet (750 mg) by mouth 3 (three) times daily as needed (for spasm)    MIDODRINE (PROAMATINE) 10 MG TABLET    Take 1 tablet (10 mg total) by mouth 3 (three) times daily with meals    MULTIPLE VITAMINS-MINERALS (CENTRUM SILVER 50+WOMEN) TAB    Take 1 tablet  by mouth daily    PANTOPRAZOLE (PROTONIX) 40 MG TABLET    Take 40 mg by mouth every morning before breakfast    POLYETHYLENE GLYCOL (MIRALAX) 17 G PACKET    Take 17 g by mouth daily    RANOLAZINE (RANEXA) 500 MG 12 HR TABLET    Take 500 mg by mouth 2 (two) times daily    TRAMADOL (ULTRAM) 50 MG TABLET    Take 50 mg by mouth every 8 (eight) hours as needed for Pain    VENLAFAXINE (EFFEXOR-XR) 37.5 MG 24 HR CAPSULE    Take 75 mg by mouth nightly    ZOLPIDEM (AMBIEN) 5 MG TABLET    Take 5 mg by mouth nightly         REVIEW OF SYSTEMS   Review of Systems   Constitutional:  Positive for activity change and appetite change. Negative for fatigue and fever.   HENT:  Negative for sore throat.    Eyes:  Negative for visual disturbance.   Respiratory:  Negative for cough and shortness of breath.    Cardiovascular:  Negative for chest pain and palpitations.   Gastrointestinal:  Positive for abdominal pain. Negative for nausea and vomiting.   Genitourinary:  Negative for dysuria.   Musculoskeletal:  Negative for arthralgias and myalgias.   Skin:  Negative for wound.   Neurological:  Negative for weakness and headaches.   Psychiatric/Behavioral:  Negative for confusion.    All other systems reviewed and are negative.   See History of Present Illness  PHYSICAL EXAM     ED Triage Vitals [01/18/22 1428]   Enc Vitals Group      BP 129/64      Heart Rate 66      Resp Rate 17      Temp 98.1 F (36.7 C)      Temp Source Oral      SpO2 100 %      Weight  79.8 kg      Height 1.753 m      Head Circumference       Peak Flow       Pain Score 7      Pain Loc       Pain Edu?       Excl. in GC?      Physical Exam  Vitals and nursing note reviewed.   Constitutional:       General: She is not in acute distress.     Appearance: Normal appearance. She is not ill-appearing.   HENT:      Head: Normocephalic and atraumatic.      Mouth/Throat:      Mouth: Mucous membranes are moist.   Eyes:      Extraocular Movements: Extraocular movements intact.       Pupils: Pupils are equal, round, and reactive to light.   Cardiovascular:      Rate and Rhythm: Normal rate and regular rhythm.      Pulses: Normal pulses.   Pulmonary:      Effort: Pulmonary effort is normal.      Breath sounds: Normal breath sounds.      Comments: Clear To auscultation bilaterally, breathing is nonlabored  Abdominal:      General: Abdomen is flat.      Palpations: Abdomen is soft.      Tenderness: There is abdominal tenderness.      Comments: Minimal Right upper quadrant tenderness, no rebound or guarding, positive bowel sounds   Musculoskeletal:         General: Normal range of motion.      Cervical back: Normal range of motion and neck supple.      Right lower leg: No edema.      Left lower leg: No edema.      Comments: 2+ radial and dorsalis pedis pulses bilaterally    Skin:     General: Skin is warm.      Capillary Refill: Capillary refill takes less than 2 seconds.   Neurological:      General: No focal deficit present.      Mental Status: She is alert and oriented to person, place, and time.      Comments: Cranial nerves II to XII grossly intact, 5 out of 5 motor strength upper and lower extremities bilaterally, sensation intact upper and lower extremities bilaterally, speech fluent, gait intact     Psychiatric:         Mood and Affect: Mood normal.          MEDICAL DECISION MAKING     PRIMARY PROBLEM LIST      {CEP ACUITY:59028} DIAGNOSIS:***  {Chronic Illness Impacting Care of the above problem:59030} {Explain (Optional):59078}  {Differential Diagnosis:59053}  DISCUSSION      ***    {If patient is being hospitalized is severe sepsis or septic shock suspected?:59467}      {Was management discussed with a consultant?:59037}  {Was the decision around the need for surgery discussed with consultant:59056::"N/A"}  {External Records Reviewed?:59023}    Additional Notes    {Diagnostic test considered and not performed:59031::"N/A"}  {Prescription medications considered and not  given:59033::"N/A"}  {Hospitalization considered but not done:59032::"N/A"}  {Social Determinants of Health Considerations:59036::"N/A"}  {Was there decision to not resuscitate or to de-escalate care due to poor prognosis?:59057}  ED Course as of 01/18/22 1709   Tue Jan 18, 2022   1640 CXR- IMPRESSION:    No active disease is seen  in the chest. [MR]      ED Course User Index  [MR] James Lafalce, Antonietta Barcelona, MD         Vital Signs: Reviewed the patient's vital signs.   Nursing Notes: Reviewed and utilized available nursing notes.  Medical Records Reviewed: Reviewed available past medical records.  Counseling: The emergency provider has spoken with the patient and discussed today's findings, in addition to providing specific details for the plan of care.  Questions are answered and there is agreement with the plan.      MIPS DOCUMENTATION    {ORAL ANTIBIOTICS given for otitis externa due to:59079}  {ACUTE BRONCHITIS Medical reasoning for giving this patient oral antibiotics for acute bronchitis are the following (Optional):59080}  {Medical reasoning for giving this patient oral antibiotics for acute sinusitis are the following (Optional):59081}  {HEAD TRAUMA Indications for head CT due to trauma (Optional):59082}    CARDIAC STUDIES    The following cardiac studies were independently interpreted by me the Emergency Medicine Provider.  For full cardiac study results please see chart.    {Monitor Strip Interpretation:59688}  {Rate:59685}  {Rhythm:59687}  {ST segments:59689}    EKG 1 interpreted by me (ED provider)  Comparison: Yes.  No acute changes.  DATE: 05/18/21  Rate: Less than 60  Rhythm: Sinus Bradycardia  ST segments: No acute changes  Impression -normal sinus rhythm, rate 55, first-degree AV block, PR 224, QTc 08/02/1980, no acute ischemic changes  {EKG interpretation:59684}  {Comparison:59859}  {Rate:59685}  {Rhythm:59687}  {ST segments:59689}  {EKG interpretation:59690}  EMERGENCY IMAGING STUDIES    The following imagine  studies were independently interpreted by me (emergency medicine provider):    {Xray interpreted by ED provider? (Optional):59468} {SIDE (Optional):59475}  {Comparison:59859}  {RESULT:59469}  {IMPRESSION:59470}    {CT interpreted by provider? (Optional):59471}  {Comparison:59859}  {RESULT:59473}  {IMPRESSION:59474}  RADIOLOGY IMAGING STUDIES      XR Chest  AP Portable    (Results Pending)   CT Abd/Pelvis with IV Contrast only    (Results Pending)       EMERGENCY DEPT. MEDICATIONS      ED Medication Orders (From admission, onward)      Start Ordered     Status Ordering Provider    01/18/22 1448 01/18/22 1447  sodium chloride 0.9 % bolus 1,000 mL  Once        Route: Intravenous  Ordered Dose: 1,000 mL       Ordered Shawnae Leiva A    01/18/22 1448 01/18/22 1447  famotidine (PEPCID) injection 20 mg  Once        Route: Intravenous  Ordered Dose: 20 mg       Ordered Oswell Say A    01/18/22 1446 01/18/22 1447  ondansetron (ZOFRAN) injection 4 mg  Once as needed        Route: Intravenous  Ordered Dose: 4 mg       Ordered Taytum Scheck A            LABORATORY RESULTS    Ordered and independently interpreted AVAILABLE laboratory tests.   Results       ** No results found for the last 24 hours. **              CRITICAL CARE/PROCEDURES    Procedures  ***Critical care?  DIAGNOSIS      Diagnosis:  Final diagnoses:   None       Disposition:  ED Disposition       None  Prescriptions:  Patient's Medications   New Prescriptions    No medications on file   Previous Medications    ACETAMINOPHEN (TYLENOL) 325 MG TABLET    Take 650 mg by mouth every 6 (six) hours as needed for Pain (or headache)    ASPIRIN EC 81 MG EC TABLET    Take 81 mg by mouth daily    CALCIUM CARBONATE (TUMS) 500 MG CHEWABLE TABLET    Chew 1 tablet by mouth every 6 (six) hours as needed for Heartburn    CANDESARTAN (ATACAND) 4 MG TABLET    Take 1 tablet (4 mg) by mouth nightly    CANDESARTAN (ATACAND) 4 MG TABLET    Take 4 mg by mouth daily as  needed (SBP > 160)    DOCUSATE SODIUM (COLACE) 100 MG CAPSULE    Take 1 capsule (100 mg total) by mouth daily    DULAGLUTIDE (TRULICITY) 0.75 MG/0.5ML SOLUTION PEN-INJECTOR    Inject 0.75 mg into the skin Once each week on Tuesday morning    ERGOCALCIFEROL (ERGOCALCIFEROL) 1.25 MG (50000 UT) CAPSULE    Take 50,000 Units by mouth once a week On Saturday    FEXOFENADINE (ALLEGRA) 180 MG TABLET    Take 180 mg by mouth daily    FLUDROCORTISONE (FLORINEF) 0.1 MG TABLET    Take 1 tablet (0.1 mg total) by mouth daily    FLUTICASONE (FLONASE SENSIMIST) 27.5 MCG/SPRAY NASAL SPRAY    1 spray by Nasal route daily    LIDOCAINE (LIDODERM) 5 %    Place 1 patch onto the skin every 24 hours Remove & Discard patch within 12 hours or as directed by MD    LIDOCAINE (LIDODERM) 5 %    Place 1 patch onto the skin every 24 hours Remove & Discard patch within 12 hours or as directed by MD    LOVASTATIN (MEVACOR) 40 MG TABLET    Take 40 mg by mouth nightly    METHOCARBAMOL (ROBAXIN) 750 MG TABLET    Take 1 tablet (750 mg) by mouth 3 (three) times daily as needed (for spasm)    MIDODRINE (PROAMATINE) 10 MG TABLET    Take 1 tablet (10 mg total) by mouth 3 (three) times daily with meals    MULTIPLE VITAMINS-MINERALS (CENTRUM SILVER 50+WOMEN) TAB    Take 1 tablet by mouth daily    PANTOPRAZOLE (PROTONIX) 40 MG TABLET    Take 40 mg by mouth every morning before breakfast    POLYETHYLENE GLYCOL (MIRALAX) 17 G PACKET    Take 17 g by mouth daily    RANOLAZINE (RANEXA) 500 MG 12 HR TABLET    Take 500 mg by mouth 2 (two) times daily    TRAMADOL (ULTRAM) 50 MG TABLET    Take 50 mg by mouth every 8 (eight) hours as needed for Pain    VENLAFAXINE (EFFEXOR-XR) 37.5 MG 24 HR CAPSULE    Take 75 mg by mouth nightly    ZOLPIDEM (AMBIEN) 5 MG TABLET    Take 5 mg by mouth nightly   Modified Medications    No medications on file   Discontinued Medications    No medications on file           This note was generated by the Epic EMR system/ Dragon speech  recognition and may contain inherent errors or omissions not intended by the user. Grammatical errors, random word insertions, deletions and pronoun errors  are occasional consequences of this technology due to software limitations.  Not all errors are caught or corrected. If there are questions or concerns about the content of this note or information contained within the body of this dictation they should be addressed directly with the author for clarification.    {THIS REVIEW SECTION WILL AUTODELETE ONCE NOTE IS SIGNED    END REVIEW SECTION(Optional):55325}

## 2022-01-20 ENCOUNTER — Ambulatory Visit (INDEPENDENT_AMBULATORY_CARE_PROVIDER_SITE_OTHER): Payer: Medicare Other | Admitting: Cardiovascular Disease

## 2022-01-20 ENCOUNTER — Encounter (INDEPENDENT_AMBULATORY_CARE_PROVIDER_SITE_OTHER): Payer: Self-pay | Admitting: Cardiovascular Disease

## 2022-01-20 VITALS — BP 145/73 | HR 65 | Ht 69.0 in | Wt 181.6 lb

## 2022-01-20 DIAGNOSIS — I2510 Atherosclerotic heart disease of native coronary artery without angina pectoris: Secondary | ICD-10-CM

## 2022-01-20 DIAGNOSIS — I2583 Coronary atherosclerosis due to lipid rich plaque: Secondary | ICD-10-CM

## 2022-01-20 MED ORDER — MIDODRINE HCL 5 MG PO TABS
5.0000 mg | ORAL_TABLET | Freq: Three times a day (TID) | ORAL | Status: DC
Start: 2022-01-20 — End: 2022-06-29

## 2022-01-20 NOTE — Progress Notes (Signed)
CARDIOLOGY OFFICE NOTE  Rukia Mcgillivray Alyson Locket, MD, [facility]     DATE OF SERVICE:  01/20/2022  PATIENT:  Vanessa Kelly     (DOB:  Jun 25, 1940  81 y.o.  female)  PCP:  Eula Fried, Amr H, DO      ASSESSMENT AND PLAN:    *CAD status post CABG 2002, status post stents 2006   -stable, asymptomatic.    -Mild apical ischemia on PET myocardial fusion scan 01/2020. -Continue with aspirin, statin, Ranexa.         *Hypertension      *History of orthostatic hypotension and Parkinson's  -start midodrine 5 tid instead of PRN.  Check BP when standing or erect and use atacand as needed if SBP > 160     *Prior history of CVA     *Diabetes     *Carotid artery disease -following with Dr. Doreatha Martin, vascular surgery.    ORDERS:  No orders of the defined types were placed in this encounter.  FOLLOW UP:  Return in about 6 months (around 07/21/2022) for office visit with ECG.. With Dr. Celene Skeen    -----------------------  HISTORY OF PRESENT ILLNESS:    81 y.o. female with hx of CAD and orthostasis    Patient presents for follow up  Continues to have dizziness, especially when standing, and fell twice last week.  Pt also notes decline in memory  Has been under stress with death in family  Denies CP and SOB    ECG:      PERTINENT PAST MEDICAL HISTORY:    *    PHYSICAL EXAM:   BP 145/73 (BP Site: Left arm, Patient Position: Sitting, Cuff Size: Medium)   Pulse 65   Ht 1.753 m (5\' 9" )   Wt 82.4 kg (181 lb 9.6 oz)   BMI 26.82 kg/m   >> General:    weak appearing,   >> Eyes:    anicteric,   >> ENMT:    moist mucosa,   >> Respiratory:    no crackles,   >> Cardiovascular:    regular rhythm,   No LE edema,   >> Gastrointestinal:    Abdomen soft and nontender,   >> Skin:    warm and dry,   >> Neuro/Psych:    Alert,   Appropriate mood and affect,      MEDICATIONS:      Current Outpatient Medications:     acetaminophen (TYLENOL) 325 MG tablet, Take 2 tablets (650 mg) by mouth every 6 (six) hours as needed for Pain (or headache), Disp: , Rfl:     albuterol sulfate  HFA (PROVENTIL) 108 (90 Base) MCG/ACT inhaler, , Disp: , Rfl:     aspirin EC 81 MG EC tablet, Take 1 tablet (81 mg) by mouth daily, Disp: , Rfl:     calcium carbonate (TUMS) 500 MG chewable tablet, Chew 1 tablet (500 mg) by mouth every 6 (six) hours as needed for Heartburn, Disp: , Rfl:     candesartan (ATACAND) 4 MG tablet, Take 1 tablet (4 mg) by mouth daily as needed (SBP > 160), Disp: , Rfl:     docusate sodium (COLACE) 100 MG capsule, Take 1 capsule (100 mg total) by mouth daily, Disp: , Rfl:     ergocalciferol (ERGOCALCIFEROL) 1.25 MG (50000 UT) capsule, Take 1 capsule (50,000 Units) by mouth once a week On Saturday, Disp: , Rfl:     fexofenadine (ALLEGRA) 180 MG tablet, Take 1 tablet (180 mg) by mouth daily, Disp: ,  Rfl:     fluticasone (Flonase Sensimist) 27.5 MCG/SPRAY nasal spray, 1 spray by Nasal route daily, Disp: , Rfl:     lidocaine (LIDODERM) 5 %, Place 1 patch onto the skin every 24 hours Remove & Discard patch within 12 hours or as directed by MD, Disp: , Rfl:     lidocaine (LIDODERM) 5 %, Place 1 patch onto the skin every 24 hours Remove & Discard patch within 12 hours or as directed by MD, Disp: 30 patch, Rfl: 0    lovastatin (MEVACOR) 40 MG tablet, Take 1 tablet (40 mg) by mouth nightly, Disp: , Rfl:     methocarbamol (ROBAXIN) 750 MG tablet, Take 1 tablet (750 mg) by mouth 3 (three) times daily as needed (for spasm), Disp: 15 tablet, Rfl: 0    Multiple Vitamins-Minerals (Centrum Silver 50+Women) Tab, Take 1 tablet by mouth daily, Disp: , Rfl:     pantoprazole (PROTONIX) 40 MG tablet, Take 1 tablet (40 mg) by mouth every morning before breakfast, Disp: , Rfl:     polyethylene glycol (MIRALAX) 17 g packet, Take 17 g by mouth daily, Disp: , Rfl:     ranolazine (RANEXA) 500 MG 12 hr tablet, Take 1 tablet (500 mg) by mouth 2 (two) times daily, Disp: , Rfl:     sucralfate (CARAFATE) 1 g tablet, Take 1 tablet (1 g) by mouth every 6 (six) hours as needed (abdominal pain), Disp: 12 tablet, Rfl: 0     traMADol (ULTRAM) 50 MG tablet, Take 1 tablet (50 mg) by mouth every 8 (eight) hours as needed for Pain, Disp: , Rfl:     venlafaxine (EFFEXOR-XR) 37.5 MG 24 hr capsule, Take 2 capsules (75 mg) by mouth nightly, Disp: , Rfl:     zolpidem (AMBIEN) 5 MG tablet, Take 1 tablet (5 mg) by mouth nightly, Disp: , Rfl:     Dulaglutide (Trulicity) 0.75 MG/0.5ML Solution Pen-injector, Inject 0.75 mg into the skin Once each week on Tuesday morning (Patient not taking: Reported on 01/20/2022), Disp: , Rfl:     midodrine (PROAMATINE) 5 MG tablet, Take 1 tablet (5 mg) by mouth 3 (three) times daily, Disp: , Rfl:       Signature,     Zaneta Lightcap Alyson LocketVan Lagretta Loseke, MD, [facility]    Tel:  [phone] Fax:  [phone]  [facility]:  [facility] [place]

## 2022-02-23 ENCOUNTER — Ambulatory Visit (INDEPENDENT_AMBULATORY_CARE_PROVIDER_SITE_OTHER): Payer: Medicare Other | Admitting: Cardiovascular Disease

## 2022-05-04 ENCOUNTER — Other Ambulatory Visit: Payer: Self-pay | Admitting: Family

## 2022-05-04 DIAGNOSIS — R102 Pelvic and perineal pain: Secondary | ICD-10-CM

## 2022-05-04 DIAGNOSIS — R1032 Left lower quadrant pain: Secondary | ICD-10-CM

## 2022-05-27 ENCOUNTER — Other Ambulatory Visit: Payer: Self-pay | Admitting: Family

## 2022-05-27 DIAGNOSIS — Z1239 Encounter for other screening for malignant neoplasm of breast: Secondary | ICD-10-CM

## 2022-05-31 ENCOUNTER — Ambulatory Visit
Admission: RE | Admit: 2022-05-31 | Discharge: 2022-05-31 | Disposition: A | Discharge status: Home / Self Care | Payer: Medicare Other | Source: Ambulatory Visit | Attending: Family | Admitting: Family

## 2022-05-31 DIAGNOSIS — R102 Pelvic and perineal pain: Secondary | ICD-10-CM | POA: Insufficient documentation

## 2022-05-31 DIAGNOSIS — R1032 Left lower quadrant pain: Secondary | ICD-10-CM | POA: Insufficient documentation

## 2022-06-07 ENCOUNTER — Ambulatory Visit: Payer: Medicare Other

## 2022-06-21 ENCOUNTER — Other Ambulatory Visit: Payer: Self-pay | Admitting: General Acute Care Hospital

## 2022-06-21 ENCOUNTER — Ambulatory Visit
Admission: RE | Admit: 2022-06-21 | Discharge: 2022-06-21 | Disposition: A | Discharge status: Home / Self Care | Payer: Medicare Other | Source: Ambulatory Visit | Attending: Family | Admitting: Family

## 2022-06-21 DIAGNOSIS — Z1239 Encounter for other screening for malignant neoplasm of breast: Secondary | ICD-10-CM

## 2022-06-21 DIAGNOSIS — Z1231 Encounter for screening mammogram for malignant neoplasm of breast: Secondary | ICD-10-CM | POA: Insufficient documentation

## 2022-06-23 ENCOUNTER — Emergency Department: Payer: Medicare Other

## 2022-06-23 ENCOUNTER — Other Ambulatory Visit: Payer: Self-pay

## 2022-06-23 ENCOUNTER — Inpatient Hospital Stay
Admission: EM | Admit: 2022-06-23 | Discharge: 2022-06-29 | DRG: 312 | Disposition: A | Discharge status: Home Health | Payer: Medicare Other | Attending: Internal Medicine | Admitting: Internal Medicine

## 2022-06-23 DIAGNOSIS — F32A Depression, unspecified: Secondary | ICD-10-CM | POA: Diagnosis present

## 2022-06-23 DIAGNOSIS — Z8249 Family history of ischemic heart disease and other diseases of the circulatory system: Secondary | ICD-10-CM

## 2022-06-23 DIAGNOSIS — E1122 Type 2 diabetes mellitus with diabetic chronic kidney disease: Secondary | ICD-10-CM | POA: Diagnosis present

## 2022-06-23 DIAGNOSIS — Z803 Family history of malignant neoplasm of breast: Secondary | ICD-10-CM

## 2022-06-23 DIAGNOSIS — H538 Other visual disturbances: Secondary | ICD-10-CM

## 2022-06-23 DIAGNOSIS — F419 Anxiety disorder, unspecified: Secondary | ICD-10-CM | POA: Diagnosis present

## 2022-06-23 DIAGNOSIS — D631 Anemia in chronic kidney disease: Secondary | ICD-10-CM

## 2022-06-23 DIAGNOSIS — R251 Tremor, unspecified: Secondary | ICD-10-CM | POA: Diagnosis present

## 2022-06-23 DIAGNOSIS — I2510 Atherosclerotic heart disease of native coronary artery without angina pectoris: Secondary | ICD-10-CM | POA: Diagnosis present

## 2022-06-23 DIAGNOSIS — Z90710 Acquired absence of both cervix and uterus: Secondary | ICD-10-CM

## 2022-06-23 DIAGNOSIS — E785 Hyperlipidemia, unspecified: Secondary | ICD-10-CM | POA: Diagnosis present

## 2022-06-23 DIAGNOSIS — Z7982 Long term (current) use of aspirin: Secondary | ICD-10-CM

## 2022-06-23 DIAGNOSIS — J302 Other seasonal allergic rhinitis: Secondary | ICD-10-CM | POA: Diagnosis present

## 2022-06-23 DIAGNOSIS — K219 Gastro-esophageal reflux disease without esophagitis: Secondary | ICD-10-CM | POA: Diagnosis present

## 2022-06-23 DIAGNOSIS — H7090 Unspecified mastoiditis, unspecified ear: Secondary | ICD-10-CM

## 2022-06-23 DIAGNOSIS — K5909 Other constipation: Secondary | ICD-10-CM | POA: Diagnosis present

## 2022-06-23 DIAGNOSIS — I161 Hypertensive emergency: Secondary | ICD-10-CM | POA: Diagnosis present

## 2022-06-23 DIAGNOSIS — R519 Headache, unspecified: Secondary | ICD-10-CM

## 2022-06-23 DIAGNOSIS — Z6829 Body mass index (BMI) 29.0-29.9, adult: Secondary | ICD-10-CM

## 2022-06-23 DIAGNOSIS — Z96651 Presence of right artificial knee joint: Secondary | ICD-10-CM | POA: Diagnosis present

## 2022-06-23 DIAGNOSIS — Z87891 Personal history of nicotine dependence: Secondary | ICD-10-CM

## 2022-06-23 DIAGNOSIS — E663 Overweight: Secondary | ICD-10-CM | POA: Diagnosis present

## 2022-06-23 DIAGNOSIS — G8929 Other chronic pain: Secondary | ICD-10-CM | POA: Diagnosis present

## 2022-06-23 DIAGNOSIS — R1031 Right lower quadrant pain: Secondary | ICD-10-CM | POA: Diagnosis present

## 2022-06-23 DIAGNOSIS — Z79899 Other long term (current) drug therapy: Secondary | ICD-10-CM

## 2022-06-23 DIAGNOSIS — H6121 Impacted cerumen, right ear: Secondary | ICD-10-CM | POA: Diagnosis present

## 2022-06-23 DIAGNOSIS — I951 Orthostatic hypotension: Principal | ICD-10-CM | POA: Diagnosis present

## 2022-06-23 DIAGNOSIS — J320 Chronic maxillary sinusitis: Secondary | ICD-10-CM | POA: Diagnosis present

## 2022-06-23 DIAGNOSIS — J323 Chronic sphenoidal sinusitis: Secondary | ICD-10-CM | POA: Diagnosis present

## 2022-06-23 DIAGNOSIS — I6523 Occlusion and stenosis of bilateral carotid arteries: Secondary | ICD-10-CM | POA: Diagnosis present

## 2022-06-23 DIAGNOSIS — I129 Hypertensive chronic kidney disease with stage 1 through stage 4 chronic kidney disease, or unspecified chronic kidney disease: Secondary | ICD-10-CM | POA: Diagnosis present

## 2022-06-23 DIAGNOSIS — D539 Nutritional anemia, unspecified: Secondary | ICD-10-CM | POA: Diagnosis present

## 2022-06-23 DIAGNOSIS — Z951 Presence of aortocoronary bypass graft: Secondary | ICD-10-CM

## 2022-06-23 DIAGNOSIS — N1831 Chronic kidney disease, stage 3a: Secondary | ICD-10-CM | POA: Diagnosis present

## 2022-06-23 LAB — COMPREHENSIVE METABOLIC PANEL
ALT: 20 U/L (ref 0–55)
AST (SGOT): 24 U/L (ref 5–41)
Albumin/Globulin Ratio: 1 (ref 0.9–2.2)
Albumin: 3.7 g/dL (ref 3.5–5.0)
Alkaline Phosphatase: 85 U/L (ref 37–117)
Anion Gap: 10 (ref 5.0–15.0)
BUN: 18 mg/dL (ref 7.0–21.0)
Bilirubin, Total: 0.3 mg/dL (ref 0.2–1.2)
CO2: 23 mEq/L (ref 17–29)
Calcium: 10.3 mg/dL — ABNORMAL HIGH (ref 7.9–10.2)
Chloride: 107 mEq/L (ref 99–111)
Creatinine: 1.1 mg/dL — ABNORMAL HIGH (ref 0.4–1.0)
Globulin: 3.8 g/dL — ABNORMAL HIGH (ref 2.0–3.6)
Glucose: 103 mg/dL — ABNORMAL HIGH (ref 70–100)
Potassium: 4.5 mEq/L (ref 3.5–5.3)
Protein, Total: 7.5 g/dL (ref 6.0–8.3)
Sodium: 140 mEq/L (ref 135–145)
eGFR: 49.5 mL/min/{1.73_m2} — AB (ref >=60)

## 2022-06-23 LAB — CBC AND DIFFERENTIAL
Absolute NRBC: 0 10*3/uL (ref 0.00–0.00)
Basophils Absolute Automated: 0.03 10*3/uL (ref 0.00–0.08)
Basophils Automated: 0.5 %
Eosinophils Absolute Automated: 0.56 10*3/uL — ABNORMAL HIGH (ref 0.00–0.44)
Eosinophils Automated: 9 %
Hematocrit: 30.4 % — ABNORMAL LOW (ref 34.7–43.7)
Hgb: 10.3 g/dL — ABNORMAL LOW (ref 11.4–14.8)
Immature Granulocytes Absolute: 0.01 10*3/uL (ref 0.00–0.07)
Immature Granulocytes: 0.2 %
Instrument Absolute Neutrophil Count: 3.05 10*3/uL (ref 1.10–6.33)
Lymphocytes Absolute Automated: 2.07 10*3/uL (ref 0.42–3.22)
Lymphocytes Automated: 33.4 %
MCH: 33.4 pg (ref 25.1–33.5)
MCHC: 33.9 g/dL (ref 31.5–35.8)
MCV: 98.7 fL — ABNORMAL HIGH (ref 78.0–96.0)
MPV: 10.5 fL (ref 8.9–12.5)
Monocytes Absolute Automated: 0.48 10*3/uL (ref 0.21–0.85)
Monocytes: 7.7 %
Neutrophils Absolute: 3.05 10*3/uL (ref 1.10–6.33)
Neutrophils: 49.2 %
Nucleated RBC: 0 /100 WBC (ref 0.0–0.0)
Platelets: 185 10*3/uL (ref 142–346)
RBC: 3.08 10*6/uL — ABNORMAL LOW (ref 3.90–5.10)
RDW: 13 % (ref 11–15)
WBC: 6.2 10*3/uL (ref 3.10–9.50)

## 2022-06-23 LAB — ECG 12-LEAD
P-R Interval: 238 ms
QRS Duration: 90 ms
QTC Calculation (Bezet): 411 ms
R Axis: -9 degrees
T Axis: 25 degrees
Ventricular Rate: 69 {beats}/min

## 2022-06-23 LAB — HIGH SENSITIVITY TROPONIN-I: hs Troponin-I: 6.7 ng/L

## 2022-06-23 MED ORDER — ONDANSETRON HCL 4 MG/2ML IJ SOLN
4.0000 mg | Freq: Once | INTRAMUSCULAR | Status: AC
Start: 2022-06-23 — End: 2022-06-23
  Administered 2022-06-23: 4 mg via INTRAVENOUS
  Filled 2022-06-23: qty 2

## 2022-06-23 MED ORDER — VITAMINS/MINERALS PO TABS
1.0000 | ORAL_TABLET | Freq: Every day | ORAL | Status: DC
Start: 2022-06-24 — End: 2022-06-29
  Administered 2022-06-24 – 2022-06-29 (×6): 1 via ORAL
  Filled 2022-06-23 (×6): qty 1

## 2022-06-23 MED ORDER — ALBUTEROL SULFATE HFA 108 (90 BASE) MCG/ACT IN AERS
1.0000 | INHALATION_SPRAY | RESPIRATORY_TRACT | Status: DC | PRN
Start: 2022-06-23 — End: 2022-06-29
  Filled 2022-06-23: qty 8

## 2022-06-23 MED ORDER — HYDRALAZINE HCL 20 MG/ML IJ SOLN
10.0000 mg | Freq: Once | INTRAMUSCULAR | Status: AC
Start: 2022-06-23 — End: 2022-06-23
  Administered 2022-06-23: 10 mg via INTRAVENOUS
  Filled 2022-06-23: qty 1

## 2022-06-23 MED ORDER — RANOLAZINE ER 500 MG PO TB12
500.0000 mg | ORAL_TABLET | Freq: Two times a day (BID) | ORAL | Status: DC
Start: 2022-06-24 — End: 2022-06-29
  Administered 2022-06-24 – 2022-06-29 (×12): 500 mg via ORAL
  Filled 2022-06-23 (×13): qty 1

## 2022-06-23 MED ORDER — ATORVASTATIN CALCIUM 10 MG PO TABS
10.0000 mg | ORAL_TABLET | Freq: Every day | ORAL | Status: DC
Start: 2022-06-24 — End: 2022-06-29
  Administered 2022-06-24 – 2022-06-29 (×6): 10 mg via ORAL
  Filled 2022-06-23 (×6): qty 1

## 2022-06-23 MED ORDER — MAGNESIUM SULFATE IN D5W 1-5 GM/100ML-% IV SOLN
1.0000 g | INTRAVENOUS | Status: DC | PRN
Start: 2022-06-23 — End: 2022-06-29
  Administered 2022-06-28: 1 g via INTRAVENOUS

## 2022-06-23 MED ORDER — GLUCOSE 40 % PO GEL (WRAP)
15.0000 g | ORAL | Status: DC | PRN
Start: 2022-06-23 — End: 2022-06-29

## 2022-06-23 MED ORDER — DEXTROSE 50 % IV SOLN
12.5000 g | INTRAVENOUS | Status: DC | PRN
Start: 2022-06-23 — End: 2022-06-29

## 2022-06-23 MED ORDER — ONDANSETRON HCL 4 MG/2ML IJ SOLN
4.0000 mg | Freq: Four times a day (QID) | INTRAMUSCULAR | Status: DC | PRN
Start: 2022-06-23 — End: 2022-06-24

## 2022-06-23 MED ORDER — VITAMIN D (ERGOCALCIFEROL) 1.25 MG (50000 UT) PO CAPS
50000.0000 [IU] | ORAL_CAPSULE | ORAL | Status: DC
Start: 2022-06-25 — End: 2022-06-29
  Administered 2022-06-25: 50000 [IU] via ORAL
  Filled 2022-06-23 (×4): qty 1

## 2022-06-23 MED ORDER — MORPHINE SULFATE 4 MG/ML IJ/IV SOLN (WRAP)
4.0000 mg | Freq: Once | Status: AC
Start: 2022-06-23 — End: 2022-06-23
  Administered 2022-06-23: 4 mg via INTRAVENOUS
  Filled 2022-06-23: qty 1

## 2022-06-23 MED ORDER — CARBOXYMETHYLCELLULOSE SOD PF 0.5 % OP SOLN
1.0000 [drp] | Freq: Three times a day (TID) | OPHTHALMIC | Status: DC | PRN
Start: 2022-06-23 — End: 2022-06-29

## 2022-06-23 MED ORDER — VENLAFAXINE HCL ER 75 MG PO CP24
75.0000 mg | ORAL_CAPSULE | Freq: Every evening | ORAL | Status: DC
Start: 2022-06-24 — End: 2022-06-29
  Administered 2022-06-24 – 2022-06-28 (×6): 75 mg via ORAL
  Filled 2022-06-23 (×7): qty 1

## 2022-06-23 MED ORDER — MIDODRINE HCL 5 MG PO TABS
5.0000 mg | ORAL_TABLET | Freq: Three times a day (TID) | ORAL | Status: DC
Start: 2022-06-24 — End: 2022-06-24
  Administered 2022-06-24: 5 mg via ORAL
  Filled 2022-06-23 (×2): qty 1

## 2022-06-23 MED ORDER — MELATONIN 3 MG PO TABS
3.0000 mg | ORAL_TABLET | Freq: Every evening | ORAL | Status: DC | PRN
Start: 2022-06-23 — End: 2022-06-29
  Administered 2022-06-24 – 2022-06-27 (×5): 3 mg via ORAL
  Filled 2022-06-23 (×6): qty 1

## 2022-06-23 MED ORDER — ZOLPIDEM TARTRATE 5 MG PO TABS
5.0000 mg | ORAL_TABLET | Freq: Every evening | ORAL | Status: DC
Start: 2022-06-24 — End: 2022-06-23

## 2022-06-23 MED ORDER — GADOTERATE MEGLUMINE 7.5 MMOL/15ML IV SOLN (CLARISCAN)
15.0000 mL | Freq: Once | INTRAVENOUS | Status: AC | PRN
Start: 2022-06-23 — End: 2022-06-23
  Administered 2022-06-23: 15 mL via INTRAVENOUS

## 2022-06-23 MED ORDER — MAGNESIUM SULFATE IN D5W 1-5 GM/100ML-% IV SOLN
1.0000 g | Freq: Once | INTRAVENOUS | Status: AC
Start: 2022-06-23 — End: 2022-06-23
  Administered 2022-06-23: 1 g via INTRAVENOUS
  Filled 2022-06-23: qty 100

## 2022-06-23 MED ORDER — CETIRIZINE HCL 10 MG PO TABS
10.0000 mg | ORAL_TABLET | Freq: Every day | ORAL | Status: DC
Start: 2022-06-24 — End: 2022-06-29
  Administered 2022-06-24 – 2022-06-29 (×6): 10 mg via ORAL
  Filled 2022-06-23 (×6): qty 1

## 2022-06-23 MED ORDER — POLYETHYLENE GLYCOL 3350 17 G PO PACK
17.0000 g | PACK | Freq: Every day | ORAL | Status: DC
Start: 2022-06-24 — End: 2022-06-29
  Administered 2022-06-24 – 2022-06-29 (×6): 17 g via ORAL
  Filled 2022-06-23 (×6): qty 1

## 2022-06-23 MED ORDER — VALSARTAN 40 MG PO TABS
40.0000 mg | ORAL_TABLET | Freq: Every day | ORAL | Status: DC
Start: 2022-06-24 — End: 2022-06-26
  Administered 2022-06-24 – 2022-06-26 (×3): 40 mg via ORAL
  Filled 2022-06-23 (×3): qty 1

## 2022-06-23 MED ORDER — BENZONATATE 100 MG PO CAPS
100.0000 mg | ORAL_CAPSULE | Freq: Three times a day (TID) | ORAL | Status: DC | PRN
Start: 2022-06-23 — End: 2022-06-29
  Administered 2022-06-27: 100 mg via ORAL
  Filled 2022-06-23: qty 1

## 2022-06-23 MED ORDER — PANTOPRAZOLE SODIUM 40 MG PO TBEC
40.0000 mg | DELAYED_RELEASE_TABLET | Freq: Every morning | ORAL | Status: DC
Start: 2022-06-24 — End: 2022-06-29
  Administered 2022-06-24 – 2022-06-29 (×6): 40 mg via ORAL
  Filled 2022-06-23 (×5): qty 1

## 2022-06-23 MED ORDER — GLUCAGON 1 MG IJ SOLR (WRAP)
1.0000 mg | INTRAMUSCULAR | Status: DC | PRN
Start: 2022-06-23 — End: 2022-06-29

## 2022-06-23 MED ORDER — ASPIRIN 81 MG PO TBEC
81.0000 mg | DELAYED_RELEASE_TABLET | Freq: Every day | ORAL | Status: DC
Start: 2022-06-24 — End: 2022-06-29
  Administered 2022-06-24 – 2022-06-29 (×6): 81 mg via ORAL
  Filled 2022-06-23 (×6): qty 1

## 2022-06-23 MED ORDER — LIDOCAINE 5 % EX PTCH
1.0000 | MEDICATED_PATCH | CUTANEOUS | Status: DC | PRN
Start: 2022-06-23 — End: 2022-06-29

## 2022-06-23 MED ORDER — POTASSIUM CHLORIDE CRYS ER 20 MEQ PO TBCR
0.0000 meq | EXTENDED_RELEASE_TABLET | ORAL | Status: DC | PRN
Start: 2022-06-23 — End: 2022-06-26

## 2022-06-23 MED ORDER — CALCIUM CARBONATE ANTACID 500 MG PO CHEW
1.0000 | CHEWABLE_TABLET | Freq: Four times a day (QID) | ORAL | Status: DC | PRN
Start: 2022-06-23 — End: 2022-06-29

## 2022-06-23 MED ORDER — POTASSIUM CHLORIDE 10 MEQ/100ML IV SOLN
10.0000 meq | INTRAVENOUS | Status: DC | PRN
Start: 2022-06-23 — End: 2022-06-26

## 2022-06-23 MED ORDER — BENZOCAINE-MENTHOL MT LOZG (WRAP)
1.0000 | LOZENGE | OROMUCOSAL | Status: DC | PRN
Start: 2022-06-23 — End: 2022-06-29

## 2022-06-23 MED ORDER — NALOXONE HCL 0.4 MG/ML IJ SOLN (WRAP)
0.2000 mg | INTRAMUSCULAR | Status: DC | PRN
Start: 2022-06-23 — End: 2022-06-29

## 2022-06-23 MED ORDER — DEXTROSE 10 % IV BOLUS
12.5000 g | INTRAVENOUS | Status: DC | PRN
Start: 2022-06-23 — End: 2022-06-29

## 2022-06-23 MED ORDER — SALINE SPRAY 0.65 % NA SOLN
2.0000 | NASAL | Status: DC | PRN
Start: 2022-06-23 — End: 2022-06-29

## 2022-06-23 MED ORDER — FLUTICASONE PROPIONATE 50 MCG/ACT NA SUSP
2.0000 | Freq: Every day | NASAL | Status: DC
Start: 2022-06-24 — End: 2022-06-29
  Administered 2022-06-24 – 2022-06-29 (×5): 2 via NASAL
  Filled 2022-06-23: qty 16

## 2022-06-23 MED ORDER — HEPARIN SODIUM (PORCINE) 5000 UNIT/ML IJ SOLN
5000.0000 [IU] | Freq: Two times a day (BID) | INTRAMUSCULAR | Status: DC
Start: 2022-06-25 — End: 2022-06-29
  Administered 2022-06-25 – 2022-06-29 (×9): 5000 [IU] via SUBCUTANEOUS
  Filled 2022-06-23 (×9): qty 1

## 2022-06-23 MED ORDER — POTASSIUM & SODIUM PHOSPHATES 280-160-250 MG PO PACK
2.0000 | PACK | ORAL | Status: DC | PRN
Start: 2022-06-23 — End: 2022-06-26

## 2022-06-23 MED ORDER — ACETAMINOPHEN 325 MG PO TABS
650.0000 mg | ORAL_TABLET | ORAL | Status: DC | PRN
Start: 2022-06-23 — End: 2022-06-29
  Administered 2022-06-24 – 2022-06-28 (×4): 650 mg via ORAL
  Filled 2022-06-23 (×3): qty 2

## 2022-06-23 NOTE — ED Notes (Signed)
Pt is in MRI at this moment

## 2022-06-23 NOTE — ED Notes (Signed)
Bed: E04  Expected date:   Expected time:   Means of arrival:   Comments:  Medic

## 2022-06-23 NOTE — ED Provider Notes (Signed)
EMERGENCY DEPARTMENT NOTE     Patient initially seen and examined at   ED PHYSICIAN ASSIGNED       None           ED MIDLEVEL (APP) ASSIGNED       Date/Time Event User Comments    06/23/22 1812 PA/NP Provider [place], [place] Diones, NP assigned as Nurse Practitioner            HISTORY OF PRESENT ILLNESS   Translator Used : No    Chief Complaint: Hypertension and Headache       82 y.o. female with past medical history as below 3 weeks of worsening  headache mostly over her right forehead with blurry and sometimes double vision, and left ear pressure. Pt states her BP has also been running higher than normal. Pt taking tylenol at home with no relief.     Independent Historian (other than patient): No  Additional History Provided by Independent Historian:  MEDICAL HISTORY     Past Medical History:  Past Medical History:   Diagnosis Date    Diabetes mellitus     Gastroesophageal reflux disease     Hyperlipidemia     Hypertension     Seasonal allergic rhinitis        Past Surgical History:  Past Surgical History:   Procedure Laterality Date    BREAST BIOPSY Right     2014    HYSTERECTOMY      [phone]       Social History:  Social History     Socioeconomic History    Marital status: Married   Tobacco Use    Smoking status: Former     Types: Cigarettes     Quit date: 1990     Years since quitting: 34.1    Smokeless tobacco: Never   Vaping Use    Vaping Use: Never used   Substance and Sexual Activity    Alcohol use: Yes     Comment: occasionally    Drug use: Never     Social Determinants of Health     Food Insecurity: No Food Insecurity (06/23/2022)    Hunger Vital Sign     Worried About Running Out of Food in the Last Year: Never true     Ran Out of Food in the Last Year: Never true   Transportation Needs: No Transportation Needs (06/23/2022)    PRAPARE - Transportation     Lack of Transportation (Medical): No     Lack of Transportation (Non-Medical): No   Intimate Partner Violence: Not At Risk  (06/23/2022)    Humiliation, Afraid, Rape, and Kick questionnaire     Fear of Current or Ex-Partner: No     Emotionally Abused: No     Physically Abused: No     Sexually Abused: No   Housing Stability: Unknown (06/23/2022)    Housing Stability Vital Sign     Unable to Pay for Housing in the Last Year: No     Unstable Housing in the Last Year: No       Family History:  Family History   Problem Relation Age of Onset    Heart failure Mother     Myocardial Infarction Father     Breast cancer Sister        Outpatient Medication:  Current Discharge Medication List        CONTINUE these medications which have NOT CHANGED    Details   acetaminophen (TYLENOL) 325  MG tablet Take 2 tablets (650 mg) by mouth every 6 (six) hours as needed for Pain (or headache)      albuterol sulfate HFA (PROVENTIL) 108 (90 Base) MCG/ACT inhaler       aspirin EC 81 MG EC tablet Take 1 tablet (81 mg) by mouth daily      calcium carbonate (TUMS) 500 MG chewable tablet Chew 1 tablet (500 mg) by mouth every 6 (six) hours as needed for Heartburn      candesartan (ATACAND) 4 MG tablet Take 1 tablet (4 mg) by mouth daily as needed (SBP > 160)      ergocalciferol (ERGOCALCIFEROL) 1.25 MG (50000 UT) capsule Take 1 capsule (50,000 Units) by mouth once a week On Saturday      fexofenadine (ALLEGRA) 180 MG tablet Take 1 tablet (180 mg) by mouth daily      fluticasone (Flonase Sensimist) 27.5 MCG/SPRAY nasal spray 1 spray by Nasal route daily      !! lidocaine (LIDODERM) 5 % Place 1 patch onto the skin every 24 hours Remove & Discard patch within 12 hours or as directed by MD      !! lidocaine (LIDODERM) 5 % Place 1 patch onto the skin every 24 hours Remove & Discard patch within 12 hours or as directed by MD  Qty: 30 patch, Refills: 0      lovastatin (MEVACOR) 40 MG tablet Take 1 tablet (40 mg) by mouth nightly      methocarbamol (ROBAXIN) 750 MG tablet Take 1 tablet (750 mg) by mouth 3 (three) times daily as needed (for spasm)  Qty: 15 tablet, Refills: 0       midodrine (PROAMATINE) 5 MG tablet Take 1 tablet (5 mg) by mouth 3 (three) times daily      Multiple Vitamins-Minerals (Centrum Silver 50+Women) Tab Take 1 tablet by mouth daily      pantoprazole (PROTONIX) 40 MG tablet Take 1 tablet (40 mg) by mouth every morning before breakfast      polyethylene glycol (MIRALAX) 17 g packet Take 17 g by mouth daily      ranolazine (RANEXA) 500 MG 12 hr tablet Take 1 tablet (500 mg) by mouth 2 (two) times daily      sucralfate (CARAFATE) 1 g tablet Take 1 tablet (1 g) by mouth every 6 (six) hours as needed (abdominal pain)  Qty: 12 tablet, Refills: 0      traMADol (ULTRAM) 50 MG tablet Take 1 tablet (50 mg) by mouth every 8 (eight) hours as needed for Pain      venlafaxine (EFFEXOR-XR) 37.5 MG 24 hr capsule Take 2 capsules (75 mg) by mouth nightly      zolpidem (AMBIEN) 5 MG tablet Take 1 tablet (5 mg) by mouth nightly       !! - Potential duplicate medications found. Please discuss with provider.            REVIEW OF SYSTEMS   Review of Systems See History of Present Illness  PHYSICAL EXAM     ED Triage Vitals [06/23/22 1803]   Enc Vitals Group      BP (!) 128/102      Heart Rate 61      Resp Rate 20      Temp 98 F (36.7 C)      Temp Source Oral      SpO2 99 %      Weight 87.9 kg      Height  Head Circumference       Peak Flow       Pain Score 9      Pain Loc       Pain Edu?       Excl. in [place]?      Physical Exam  Vitals and nursing note reviewed.   Constitutional:       Appearance: Normal appearance.   HENT:      Head: Normocephalic.        Mouth/Throat:      Mouth: Mucous membranes are moist.   Eyes:      Pupils: Pupils are equal, round, and reactive to light.   Cardiovascular:      Rate and Rhythm: Normal rate.      Pulses: Normal pulses.   Pulmonary:      Effort: Pulmonary effort is normal.   Abdominal:      General: Abdomen is flat.   Neurological:      Mental Status: She is alert.          MEDICAL DECISION MAKING     PRIMARY PROBLEM LIST      Acute illness/injury  DIAGNOSIS:headache  Chronic Illness Impacting Care of the above problem: N/A Increases complexity of evaluation, Increases the risk of severe disease, and Limits treatment options  Differential Diagnosis: Headache: hydrocephalus, migraine, subarachnoid hemorrhage, tension headache, cluster headache, meningitis, closed angle glaucoma, temporal arteritis     DISCUSSION      CBC to assess for anemia and infection.   CMP to assess for electrolyte abnormalities and kidney function  CT head to r/o acute abnormality.   EKG r/o STEMI or arrhythmia  IVF for hydration  Pt is not a candidate for TNK    Hydralazine given for elevated blood pressure.     D/w Dr. Always recommends admit with MRI w/wo Brain and MRV to r/o venous thrombus.     D/w berhane who will accept for admit to Tele.         If patient is being hospitalized is severe sepsis or septic shock suspected?: N/A      Was management discussed with a consultant?: Yes (explain)    External Records Reviewed?: Physician Office Records and Inpatient Records    Additional Notes                 NIH Stroke Score      Flowsheet Row Most Recent Value   NIH Stroke Scale    Interval Baseline admission to ED   1a. Level of Consciousness 0   1b. LOC Questions (age, month) 0   1c. LOC Commands (Open and close eyes, Grip AND release good hand) 0   2. Best Gaze 0   3. Visual Fields 0   4. Facial Palsy 0   5a. Motor Left Arm: (Arms with palm down X 10 seconds. Sitting = arms at 90 degrees. Supine = arms 45 degrees) 0   5b. Motor Right Arm: (Arms with palm down X 10 seconds. Sitting = arms at 90 degrees Supine = arms 45 degrees) 0   Total Motor Arms 0   6a. Motor Left Leg: (Leg elevated X 5 seconds Supine = Leg 30 degrees) 0   6b. Motor Right Leg: (Leg elevated X 5 seconds Supine = Leg 30 degrees) 0   Total Motor Legs 0   7. Limb Ataxia (Finger to nose, heel to shin) 0   8. Sensory (Sensation or grimace to pin prick on face, arm, trunk,  and leg) 0   9. Best language (Describe picture,  name items, read sentences from NIHSS booklet) 0   10. Dysarthria (Read list from NIHSS booklet) 0   11. Extinction and Inattention (formerly Neglect) - Test tactile and visual stimulation 0   NIHSS Total 0            Vital Signs: Reviewed the patient's vital signs.   Nursing Notes: Reviewed and utilized available nursing notes.  Medical Records Reviewed: Reviewed available past medical records.  Counseling: The emergency provider has spoken with the patient and discussed today's findings, in addition to providing specific details for the plan of care.  Questions are answered and there is agreement with the plan.      MIPS DOCUMENTATION        CARDIAC STUDIES    The following cardiac studies were independently interpreted by me the Emergency Medicine Provider.  For full cardiac study results please see chart.    Monitor Strip interpreted by me (ED provider)  Rate: 60-100  Rhythm: Normal Sinus Rhythm  ST segments: No acute changes    EKG 1 interpreted by me (ED provider)  Comparison: Yes.  No acute changes.  DATE: 01/18/2022 17  Time Interpreted: 1814  Rate: 60-100  Rhythm: Other (explain) sinus with 1st degree AV Block 1703  ST segments: No acute changes  STEMI?: NO  EKG interpretation: Abnormal      EMERGENCY IMAGING STUDIES    The following imagine studies were independently interpreted by me (emergency medicine provider):      CT Head Interpreted by me (ED Provider)  Comparison: None available  RESULT: No Hemorrhage or Mass Effect  IMPRESSION: No acute abnormality  RADIOLOGY IMAGING STUDIES      MRI Brain with/without Contrast   Final Result          1.No acute infarct, evidence of hemorrhage, extra-axial collections,   hydrocephalus, abnormal enhancement, or mass effect.   2.Abnormal T2 signal in the right internal carotid artery, different from   MRI in 2022 and concerning for substantial flow limitation. MRA or CTA is   recommended.   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   4.Background  atrophy and mild white matter changes of chronic small vessel   disease.   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   6.Nonspecific fluid in right mastoid air cells.      Herbert Seta MD, MD   06/23/2022 9:21 PM      CT Head WO Contrast   Final Result       No CT evidence of acute intracranial hemorrhage, herniation, or   hydrocephalus.       Worsening mastoid effusions bilaterally as detailed above.      Nelya Ebadirad   06/23/2022 7:36 PM      MRV Head W WO Contrast    (Results Pending)       EMERGENCY DEPT. MEDICATIONS      ED Medication Orders (From admission, onward)      Start Ordered     Status Ordering Provider    06/23/22 2216 06/23/22 2215  ondansetron (ZOFRAN) injection 4 mg  Once        Route: Intravenous  Ordered Dose: 4 mg       Last MAR action: Given Oliver Hum    06/23/22 2106 06/23/22 2107  gadoterate meglumine (CLARISCAN) 7.5 MMOL/15ML injection 15 mL  IMG once as needed  Route: Intravenous  Ordered Dose: 15 mL       Last MAR action: Imaging Agent Given Oliver Hum    06/23/22 2000 06/23/22 1959  hydrALAZINE (APRESOLINE) injection 10 mg  Once        Route: Intravenous  Ordered Dose: 10 mg       Last MAR action: Given Oliver Hum    06/23/22 1948 06/23/22 1947  magnesium sulfate 1g in dextrose 5% 138m IVPB (premix)  Once        Route: Intravenous  Ordered Dose: 1 g       Last MAR action: SLavena Stanford   06/23/22 1943 06/23/22 1942  morphine injection 4 mg  Once        Route: Intravenous  Ordered Dose: 4 mg       Last MAR action: Given JOliver Hum   06/23/22 1943 06/23/22 1942  ondansetron (ZOFRAN) injection 4 mg  Once        Route: Intravenous  Ordered Dose: 4 mg       Last MAR action: Given Juniper Cobey, [place]   Ordered and independently interpreted AVAILABLE laboratory tests.   Results       Procedure Component Value Units Date/Time    C Reactive Protein [[id number] Collected: 06/23/22 1841    Specimen: Blood Updated: 06/24/22 0129     C-Reactive Protein 0.6 mg/dL     Sedimentation rate (ESR) [[id number] (Abnormal) Collected: 06/23/22 1841    Specimen: Blood Updated: 06/24/22 0124     Sed Rate 40 mm/Hr     Procalcitonin [[id number]: 06/23/22 1841     Updated: 06/24/22 0120    Narrative:      Aliquot serum prior to transport to ICL    High Sensitivity Troponin-I [[id number]: 06/23/22 1841    Specimen: Blood Updated: 06/23/22 1921     hs Troponin-I 6.7 ng/L     Comprehensive metabolic panel [[id number] (Abnormal) Collected: 06/23/22 1841    Specimen: Blood Updated: 06/23/22 1914     Glucose 103 mg/dL      BUN 18.0 mg/dL      Creatinine 1.1 mg/dL      Sodium 140 mEq/L      Potassium 4.5 mEq/L      Chloride 107 mEq/L      CO2 23 mEq/L      Calcium 10.3 mg/dL      Protein, Total 7.5 g/dL      Albumin 3.7 g/dL      AST (SGOT) 24 U/L      ALT 20 U/L      Alkaline Phosphatase 85 U/L      Bilirubin, Total 0.3 mg/dL      Globulin 3.8 g/dL      Albumin/Globulin Ratio 1.0     Anion Gap 10.0     eGFR 49.5 mL/min/1.73 m2     CBC and differential [[id number] (Abnormal) Collected: 06/23/22 1841    Specimen: Blood Updated: 06/23/22 1858     WBC 6.20 x10 3/uL      Hgb 10.3 g/dL      Hematocrit 30.4 %      Platelets 185 x10 3/uL      RBC 3.08 x10 6/uL      MCV 98.7 fL      MCH 33.4 pg      MCHC 33.9 g/dL  RDW 13 %      MPV 10.5 fL      Instrument Absolute Neutrophil Count 3.05 x10 3/uL      Neutrophils 49.2 %      Lymphocytes Automated 33.4 %      Monocytes 7.7 %      Eosinophils Automated 9.0 %      Basophils Automated 0.5 %      Immature Granulocytes 0.2 %      Nucleated RBC 0.0 /100 WBC      Neutrophils Absolute 3.05 x10 3/uL      Lymphocytes Absolute Automated 2.07 x10 3/uL      Monocytes Absolute Automated 0.48 x10 3/uL      Eosinophils Absolute Automated 0.56 x10 3/uL      Basophils Absolute Automated 0.03 x10 3/uL      Immature Granulocytes Absolute 0.01 x10 3/uL       Absolute NRBC 0.00 x10 3/uL               CRITICAL CARE/PROCEDURES    Procedures    DIAGNOSIS      Diagnosis:  Final diagnoses:   Headache       Disposition:  ED Disposition       ED Disposition   Observation    Condition   --    Date/Time   Thu Jun 23, 2022  9:31 PM    Comment   Admitting Physician: Luana Shu [id number]   Service:: Medicine [106]   Estimated Length of Stay: < 2 midnights   Tentative Discharge Plan?: Home or Self Care [1]   Does patient need telemetry?: Yes   Is patient 18 yrs or greater?: Yes   Telemetry type (separate Telemetry order is also required):: Adult telemetry                 Prescriptions:  Current Discharge Medication List        CONTINUE these medications which have NOT CHANGED    Details   acetaminophen (TYLENOL) 325 MG tablet Take 2 tablets (650 mg) by mouth every 6 (six) hours as needed for Pain (or headache)      albuterol sulfate HFA (PROVENTIL) 108 (90 Base) MCG/ACT inhaler       aspirin EC 81 MG EC tablet Take 1 tablet (81 mg) by mouth daily      calcium carbonate (TUMS) 500 MG chewable tablet Chew 1 tablet (500 mg) by mouth every 6 (six) hours as needed for Heartburn      candesartan (ATACAND) 4 MG tablet Take 1 tablet (4 mg) by mouth daily as needed (SBP > 160)      ergocalciferol (ERGOCALCIFEROL) 1.25 MG (50000 UT) capsule Take 1 capsule (50,000 Units) by mouth once a week On Saturday      fexofenadine (ALLEGRA) 180 MG tablet Take 1 tablet (180 mg) by mouth daily      fluticasone (Flonase Sensimist) 27.5 MCG/SPRAY nasal spray 1 spray by Nasal route daily      !! lidocaine (LIDODERM) 5 % Place 1 patch onto the skin every 24 hours Remove & Discard patch within 12 hours or as directed by MD      !! lidocaine (LIDODERM) 5 % Place 1 patch onto the skin every 24 hours Remove & Discard patch within 12 hours or as directed by MD  Qty: 30 patch, Refills: 0      lovastatin (MEVACOR) 40 MG tablet Take 1 tablet (40 mg) by mouth nightly  methocarbamol (ROBAXIN) 750 MG tablet  Take 1 tablet (750 mg) by mouth 3 (three) times daily as needed (for spasm)  Qty: 15 tablet, Refills: 0      midodrine (PROAMATINE) 5 MG tablet Take 1 tablet (5 mg) by mouth 3 (three) times daily      Multiple Vitamins-Minerals (Centrum Silver 50+Women) Tab Take 1 tablet by mouth daily      pantoprazole (PROTONIX) 40 MG tablet Take 1 tablet (40 mg) by mouth every morning before breakfast      polyethylene glycol (MIRALAX) 17 g packet Take 17 g by mouth daily      ranolazine (RANEXA) 500 MG 12 hr tablet Take 1 tablet (500 mg) by mouth 2 (two) times daily      sucralfate (CARAFATE) 1 g tablet Take 1 tablet (1 g) by mouth every 6 (six) hours as needed (abdominal pain)  Qty: 12 tablet, Refills: 0      traMADol (ULTRAM) 50 MG tablet Take 1 tablet (50 mg) by mouth every 8 (eight) hours as needed for Pain      venlafaxine (EFFEXOR-XR) 37.5 MG 24 hr capsule Take 2 capsules (75 mg) by mouth nightly      zolpidem (AMBIEN) 5 MG tablet Take 1 tablet (5 mg) by mouth nightly       !! - Potential duplicate medications found. Please discuss with provider.              This note was generated by the Epic EMR system/ Dragon speech recognition and may contain inherent errors or omissions not intended by the user. Grammatical errors, random word insertions, deletions and pronoun errors  are occasional consequences of this technology due to software limitations. Not all errors are caught or corrected. If there are questions or concerns about the content of this note or information contained within the body of this dictation they should be addressed directly with the author for clarification.           Oliver Hum, NP  06/24/22 [phone]

## 2022-06-23 NOTE — H&P (Signed)
ADMISSION HISTORY AND PHYSICAL EXAM    [facility], DIVISION OF HOSPITALIST MEDICINE   [place] Pager: [phone]      Date Time: 06/24/22 3:08 AM  Patient Name: Vanessa Kelly  Attending Physician: Luana Shu, MD  Primary Care Physician: Cherie Ouch, DO    CC: headache x 3 weeks  History Gathered From: Self    Assessment:     Active Hospital Problems    Diagnosis    Mastoiditis    Headache       Vanessa Kelly is a 82 y.o. 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    Plan:   -Admit to [facility] Hospitalist service    #Mastoiditis   #Right-sided headache; r/o TIA, venous thrombosis  #Hx of prior TIA/CVA ?  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 Lovenia Shuck) consulted by ED, appreciate follow-up  -Will need ENT consult in AM    #Elevated blood pressure  #Hypertension  #Hyperlipidemia  #CAD s/p CABG (2002), stents (2006)  #Bilateral carotid artery stenosis  -Per patient, her BP has been elevated intermittently for the past 3 weeks  -BP elevated on admission, improved after IV hydralazine x1, continue to monitor  -01/2020 PET  myocardial perfusion: small area of apical wall ischemia  -05/2020 carotid A dopplers: bilateral 50-69% stenosis of ICAs  -11/2020 echo: EF 65-70%, LVH, trace aortic regurgitation  -Continue valsartan (substitute for home candesartan), ranolazine, midodrine  -Continue home ASA and statin  -PRN IV hydralazine for SBP >160  -Followed by outpatient cardiologist [facility])    #RLQ abdominal pain  -Afebrile, no leukocytosis  -05/31/2022 CT A/P: large hiatal hernia, colonic diverticulosis, stable left renal cysts  -TTP of RLQ abdomen on exam  -Denies any diarrhea, hematemesis, hematochezia  -Mild N/V present during exam secondary to recent administration of contrast dye  -Continue to monitor, consider repeat CT A/P if persistent/worsening abdominal pain    #Chronic anemia  -H/H stable on admission, no signs of active bleeding on exam, monitor CBC  -Vitamin B12 and iron profile pending    #T2DM  -12/2020 A1c 5.3%  -Repeat A1c pending  -Not on any outpatient medical management currently    #CKD stage 3a  -Cr 1.1 on admission, appears to be baseline per chart review, monitor BMP  -Avoid NSAIDs/nephrotoxins, renally dose medications  -Heparin prophylaxis dose adjusted for renal function    #GERD  -Continue home pantoprazole and PRN Tums    #Chronic constipation  -Continue home MiraLAX    #Seasonal allergic rhinitis  -Continue Zyrtec (substitute for home Allegra), Flonase    #Anxiety/depression  -Continue home venlafaxine    #Osteoarthritis  #S/p right TKA  -Continue home  PRN lidocaine patches        Patient has BMI=Body mass index is 29.28 kg/m.  Diagnosis: Overweight based on BMI criteria               Recent Labs   Lab 06/23/22  1841   Hgb 10.3*   Hematocrit 30.4*   MCV 98.7*   WBC 6.20   Platelets 185         Anemia Diagnosis: Acute: Acute Anemia, Unspecified on Chronic: Chronic Anemia, Unspecified      Nutrition:Diet Consistent Carbohydrate and Heart Healthy  VTE Prophylaxis-Medication VTE Prophylaxis Orders: heparin  (porcine) injection 5,000 Units  Mechanical VTE Prophylaxis Orders: Maintain sequential compression device    Code status: Full code     Status/Disposition:   Pt is admitted under OBSERVATION with above concerns.    Anticipated medical stability for discharge: 24 Hrs       History of Presenting Illnes   Vanessa Kelly is a 82 y.o. female who presented to ED for elevated BP x 3 weeks associated with worsening HA, bilateral blurry vision/diplopia, and left ear fullness/pressure. Patient also admits to drooling on exam today and RLQ abdominal pain. She denies any fever/chills, ear drainage, tinnitus, chest pain, shortness of breath, diarrhea, or urinary symptoms.  She has a history of chronic constipation for which she takes MiraLAX outpatient. Currently experiencing mild nausea/vomiting during exam due to recent administration of contrast dye for imaging. Patient had recent CT scan of abdomen done on 05/31/2022 which demonstrated large hiatal hernia, colonic diverticulosis, and stable left renal cysts.    Past Medical Histor     Past Medical History:   Diagnosis Date    Diabetes mellitus     Gastroesophageal reflux disease     Hyperlipidemia     Hypertension     Seasonal allergic rhinitis      Available old records reviewed, including: EPIC     Past Surgical History:     Past Surgical History:   Procedure Laterality Date    BREAST BIOPSY Right     2014    HYSTERECTOMY      [phone]       Family History:     Family History   Problem Relation Age of Onset    Heart failure Mother     Myocardial Infarction Father     Breast cancer Sister        Social History:    reports that she quit smoking about 34 years ago. Her smoking use included cigarettes. She has never used smokeless tobacco. She reports current alcohol use. She reports that she does not use drugs.    Allergies:     Allergies   Allergen Reactions    Fentanyl Anaphylaxis     Hives    Percolone [Oxycodone]        Medications:     Home Medications        Med List Status: Complete Set By: Welton Flakes, RN at 06/23/2022 11:46 PM              acetaminophen (TYLENOL) 325 MG tablet     Take 2 tablets (650 mg) by mouth every 6 (six) hours as needed for Pain (or headache)     albuterol sulfate HFA (PROVENTIL) 108 (90 Base) MCG/ACT inhaler          aspirin EC 81 MG EC tablet     Take 1 tablet (81 mg) by mouth daily     calcium  carbonate (TUMS) 500 MG chewable tablet     Chew 1 tablet (500 mg) by mouth every 6 (six) hours as needed for Heartburn     candesartan (ATACAND) 4 MG tablet     Take 1 tablet (4 mg) by mouth daily as needed (SBP > 160)     ergocalciferol (ERGOCALCIFEROL) 1.25 MG (50000 UT) capsule     Take 1 capsule (50,000 Units) by mouth once a week On Saturday     fexofenadine (ALLEGRA) 180 MG tablet     Take 1 tablet (180 mg) by mouth daily     fluticasone (Flonase Sensimist) 27.5 MCG/SPRAY nasal spray     1 spray by Nasal route daily     lidocaine (LIDODERM) 5 %     Place 1 patch onto the skin every 24 hours Remove & Discard patch within 12 hours or as directed by MD     lidocaine (LIDODERM) 5 %     Place 1 patch onto the skin every 24 hours Remove & Discard patch within 12 hours or as directed by MD     lovastatin (MEVACOR) 40 MG tablet     Take 1 tablet (40 mg) by mouth nightly     methocarbamol (ROBAXIN) 750 MG tablet     Take 1 tablet (750 mg) by mouth 3 (three) times daily as needed (for spasm)     midodrine (PROAMATINE) 5 MG tablet     Take 1 tablet (5 mg) by mouth 3 (three) times daily     Multiple Vitamins-Minerals (Centrum Silver 50+Women) Tab     Take 1 tablet by mouth daily     pantoprazole (PROTONIX) 40 MG tablet     Take 1 tablet (40 mg) by mouth every morning before breakfast     polyethylene glycol (MIRALAX) 17 g packet     Take 17 g by mouth daily     ranolazine (RANEXA) 500 MG 12 hr tablet     Take 1 tablet (500 mg) by mouth 2 (two) times daily     sucralfate (CARAFATE) 1 g tablet     Take 1 tablet (1 g) by mouth every 6 (six) hours as  needed (abdominal pain)     traMADol (ULTRAM) 50 MG tablet     Take 1 tablet (50 mg) by mouth every 8 (eight) hours as needed for Pain     venlafaxine (EFFEXOR-XR) 37.5 MG 24 hr capsule     Take 2 capsules (75 mg) by mouth nightly     zolpidem (AMBIEN) 5 MG tablet     Take 1 tablet (5 mg) by mouth nightly                Method by which medications were confirmed on admission: Patient      Review of Systems:   All other systems were reviewed and are negative except:as above in HPI     Physical Exam:   Patient Vitals for the past 24 hrs:   BP Temp Temp src Pulse Resp SpO2 Height Weight   06/24/22 0118 151/79 97.7 F (36.5 C) Oral 85 18 95 % -- --   06/23/22 2256 177/75 97.3 F (36.3 C) Oral 82 19 96 % 1.702 m ('5\' 7"'$ ) 84.8 kg (186 lb 15.2 oz)   06/23/22 2220 -- -- -- -- -- -- 1.702 m ('5\' 7"'$ ) 84.8 kg (186 lb 15.2 oz)   06/23/22 2212 147/65 -- -- 74 -- 95 % -- --   06/23/22 2157 123/59 -- --  72 -- 96 % -- --   06/23/22 2142 145/65 -- -- 70 -- 99 % -- --   06/23/22 2127 161/68 -- -- 75 -- 98 % -- --   06/23/22 2117 189/78 -- -- 73 -- 95 % -- --   06/23/22 2000 199/80 98.2 F (36.8 C) Oral 70 14 99 % -- --   06/23/22 1951 (!) 236/96 -- -- 73 20 99 % -- --   06/23/22 1803 (!) 128/102 98 F (36.7 C) Oral 61 20 99 % -- 87.9 kg (193 lb 12.6 oz)     Body mass index is 29.28 kg/m.    Intake/Output Summary (Last 24 hours) at 06/24/2022 0308  Last data filed at 06/23/2022 2005  Gross per 24 hour   Intake 100 ml   Output --   Net 100 ml     General: WD female in no acute distress.  Cardiovascular: normal S1 and S2, no murmurs, rubs or gallops  Lungs: clear to auscultation bilaterally, without wheezing, rhonchi, or rales  Abdomen: soft, TTP of RLQ, non-distended  Neuro: alert, oriented x 3, strength 5/5 in upper and lower extremities, sensation intact      Labs:     Results       Procedure Component Value Units Date/Time    C Reactive Protein [id number] Collected: 06/23/22 1841    Specimen: Blood Updated: 06/24/22 0129      C-Reactive Protein 0.6 mg/dL     Sedimentation rate (ESR) [id number]  (Abnormal) Collected: 06/23/22 1841    Specimen: Blood Updated: 06/24/22 0124     Sed Rate 40 mm/Hr     Procalcitonin [id number] Collected: 06/23/22 1841     Updated: 06/24/22 0120    Narrative:      Aliquot serum prior to transport to ICL    High Sensitivity Troponin-I [id number] Collected: 06/23/22 1841    Specimen: Blood Updated: 06/23/22 1921     hs Troponin-I 6.7 ng/L     Comprehensive metabolic panel [id number]  (Abnormal) Collected: 06/23/22 1841    Specimen: Blood Updated: 06/23/22 1914     Glucose 103 mg/dL      BUN 18.0 mg/dL      Creatinine 1.1 mg/dL      Sodium 140 mEq/L      Potassium 4.5 mEq/L      Chloride 107 mEq/L      CO2 23 mEq/L      Calcium 10.3 mg/dL      Protein, Total 7.5 g/dL      Albumin 3.7 g/dL      AST (SGOT) 24 U/L      ALT 20 U/L      Alkaline Phosphatase 85 U/L      Bilirubin, Total 0.3 mg/dL      Globulin 3.8 g/dL      Albumin/Globulin Ratio 1.0     Anion Gap 10.0     eGFR 49.5 mL/min/1.73 m2     CBC and differential [id number]  (Abnormal) Collected: 06/23/22 1841    Specimen: Blood Updated: 06/23/22 1858     WBC 6.20 x10 3/uL      Hgb 10.3 g/dL      Hematocrit 30.4 %      Platelets 185 x10 3/uL      RBC 3.08 x10 6/uL      MCV 98.7 fL      MCH 33.4 pg      MCHC 33.9 g/dL      RDW 13 %  MPV 10.5 fL      Instrument Absolute Neutrophil Count 3.05 x10 3/uL      Neutrophils 49.2 %      Lymphocytes Automated 33.4 %      Monocytes 7.7 %      Eosinophils Automated 9.0 %      Basophils Automated 0.5 %      Immature Granulocytes 0.2 %      Nucleated RBC 0.0 /100 WBC      Neutrophils Absolute 3.05 x10 3/uL      Lymphocytes Absolute Automated 2.07 x10 3/uL      Monocytes Absolute Automated 0.48 x10 3/uL      Eosinophils Absolute Automated 0.56 x10 3/uL      Basophils Absolute Automated 0.03 x10 3/uL      Immature Granulocytes Absolute 0.01 x10 3/uL      Absolute NRBC 0.00 x10 3/uL              EKG: Reviewed,  demonstrates sinus rhythm with marked sinus arrhythmia with first-degree AV block and rate of 69 bpm.    Imaging personally reviewed, including: all available   MRI Brain with/without Contrast    Result Date: 06/23/2022   1.No acute infarct, evidence of hemorrhage, extra-axial collections, hydrocephalus, abnormal enhancement, or mass effect. 2.Abnormal T2 signal in the right internal carotid artery, different from MRI in 2022 and concerning for substantial flow limitation. MRA or CTA is recommended. 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 4.Background atrophy and mild white matter changes of chronic small vessel disease. 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 6.Nonspecific fluid in right mastoid air cells. Herbert Seta MD, MD 06/23/2022 9:21 PM    CT Head WO Contrast    Result Date: 06/23/2022   No CT evidence of acute intracranial hemorrhage, herniation, or hydrocephalus. Worsening mastoid effusions bilaterally as detailed above. Nelya Ebadirad 06/23/2022 7:36 PM     This note was generated by the Epic EMR system/ Dragon speech recognition and may contain inherent errors or omissions not intended by the user. Grammatical errors, random word insertions, deletions and pronoun errors  are occasional consequences of this technology due to software limitations. Not all errors are caught or corrected. If there are questions or concerns about the content of this note or information contained within the body of this dictation they should be addressed directly with the author for clarification.    I spent 60 minutes for this pt including coordination of care for conditions described in my assessment and plan.      Case was discussed with Pt ,RN, attending physician Dr. Earlyne Iba.     Signed by: Rushie Chestnut, PA  [id number], Amr H, DO

## 2022-06-23 NOTE — ED to IP RN Note (Signed)
[facility]  ED NURSING NOTE FOR THE RECEIVING INPATIENT NURSE   ED NURSE Oneida Alar [phone]   ED CHARGE RN Claiborne Billings   ADMISSION INFORMATION   CARLEAH BARUT is a 82 y.o. female admitted with an ED diagnosis of:    No diagnosis found.     Isolation: None   Allergies: Fentanyl and Percolone [oxycodone]   Holding Orders confirmed? No   Belongings Documented? No   Home medications sent to pharmacy confirmed? No   NURSING CARE   Patient Comes From:   Mental Status: Nursing home  alert and oriented   ADL: Needs assistance with ADLs   Ambulation: Unable to assess   Pertinent Information  and Safety Concerns:     [place] Violence Risk Level: Low N/A     CT / NIH   CT Head ordered on this patient?  Yes   NIH/Dysphagia assessment done prior to admission? Yes   VITAL SIGNS (at the time of this note)      Vitals:    06/23/22 2000   BP: 199/80   Pulse: 70   Resp: 14   Temp: 98.2 F (36.8 C)   SpO2: 99%     Pain Score: 7-severe pain (06/23/22 1817)

## 2022-06-24 DIAGNOSIS — H7090 Unspecified mastoiditis, unspecified ear: Secondary | ICD-10-CM

## 2022-06-24 DIAGNOSIS — N1831 Chronic kidney disease, stage 3a: Secondary | ICD-10-CM

## 2022-06-24 DIAGNOSIS — F32A Depression, unspecified: Secondary | ICD-10-CM

## 2022-06-24 DIAGNOSIS — D539 Nutritional anemia, unspecified: Secondary | ICD-10-CM

## 2022-06-24 DIAGNOSIS — R519 Headache, unspecified: Secondary | ICD-10-CM

## 2022-06-24 DIAGNOSIS — E663 Overweight: Secondary | ICD-10-CM

## 2022-06-24 DIAGNOSIS — H6121 Impacted cerumen, right ear: Secondary | ICD-10-CM

## 2022-06-24 DIAGNOSIS — I951 Orthostatic hypotension: Secondary | ICD-10-CM

## 2022-06-24 DIAGNOSIS — I10 Essential (primary) hypertension: Secondary | ICD-10-CM

## 2022-06-24 DIAGNOSIS — J302 Other seasonal allergic rhinitis: Secondary | ICD-10-CM

## 2022-06-24 DIAGNOSIS — E119 Type 2 diabetes mellitus without complications: Secondary | ICD-10-CM

## 2022-06-24 LAB — HEMOGLOBIN A1C
Average Estimated Glucose: 108.3 mg/dL
Hemoglobin A1C: 5.4 % (ref 4.6–5.6)

## 2022-06-24 LAB — CBC AND DIFFERENTIAL
Absolute NRBC: 0 10*3/uL (ref 0.00–0.00)
Basophils Absolute Automated: 0.03 10*3/uL (ref 0.00–0.08)
Basophils Automated: 0.4 %
Eosinophils Absolute Automated: 0.12 10*3/uL (ref 0.00–0.44)
Eosinophils Automated: 1.7 %
Hematocrit: 28.9 % — ABNORMAL LOW (ref 34.7–43.7)
Hgb: 9.5 g/dL — ABNORMAL LOW (ref 11.4–14.8)
Immature Granulocytes Absolute: 0.02 10*3/uL (ref 0.00–0.07)
Immature Granulocytes: 0.3 %
Instrument Absolute Neutrophil Count: 4.97 10*3/uL (ref 1.10–6.33)
Lymphocytes Absolute Automated: 1.43 10*3/uL (ref 0.42–3.22)
Lymphocytes Automated: 20.5 %
MCH: 33 pg (ref 25.1–33.5)
MCHC: 32.9 g/dL (ref 31.5–35.8)
MCV: 100.3 fL — ABNORMAL HIGH (ref 78.0–96.0)
MPV: 10.7 fL (ref 8.9–12.5)
Monocytes Absolute Automated: 0.41 10*3/uL (ref 0.21–0.85)
Monocytes: 5.9 %
Neutrophils Absolute: 4.97 10*3/uL (ref 1.10–6.33)
Neutrophils: 71.2 %
Nucleated RBC: 0 /100 WBC (ref 0.0–0.0)
Platelets: 177 10*3/uL (ref 142–346)
RBC: 2.88 10*6/uL — ABNORMAL LOW (ref 3.90–5.10)
RDW: 13 % (ref 11–15)
WBC: 6.98 10*3/uL (ref 3.10–9.50)

## 2022-06-24 LAB — SEDIMENTATION RATE: Sed Rate: 40 mm/Hr — ABNORMAL HIGH (ref 0–20)

## 2022-06-24 LAB — BASIC METABOLIC PANEL
Anion Gap: 9 (ref 5.0–15.0)
BUN: 17 mg/dL (ref 7.0–21.0)
CO2: 23 mEq/L (ref 17–29)
Calcium: 9.5 mg/dL (ref 7.9–10.2)
Chloride: 107 mEq/L (ref 99–111)
Creatinine: 1 mg/dL (ref 0.4–1.0)
Glucose: 149 mg/dL — ABNORMAL HIGH (ref 70–100)
Potassium: 4.6 mEq/L (ref 3.5–5.3)
Sodium: 139 mEq/L (ref 135–145)
eGFR: 55.5 mL/min/{1.73_m2} — AB (ref >=60)

## 2022-06-24 LAB — C-REACTIVE PROTEIN: C-Reactive Protein: 0.6 mg/dL (ref 0.0–1.1)

## 2022-06-24 LAB — LIPID PANEL
Cholesterol / HDL Ratio: 2.2 Index
Cholesterol: 163 mg/dL (ref 0–199)
HDL: 74 mg/dL (ref 40–9999)
LDL Calculated: 81 mg/dL (ref 0–99)
Triglycerides: 39 mg/dL (ref 34–149)
VLDL Calculated: 8 mg/dL — ABNORMAL LOW (ref 10–40)

## 2022-06-24 LAB — VITAMIN B12: Vitamin B-12: 673 pg/mL (ref 211–911)

## 2022-06-24 LAB — HEMOLYSIS INDEX(SOFT): Hemolysis Index: 9 Index (ref 0–24)

## 2022-06-24 LAB — IRON PROFILE
Iron Saturation: 21 % (ref 15–50)
Iron: 43 ug/dL (ref 32–157)
TIBC: 206 ug/dL — ABNORMAL LOW (ref 265–497)
UIBC: 163 ug/dL (ref 126–382)

## 2022-06-24 LAB — PROCALCITONIN: Procalcitonin: 0.04 ng/ml (ref 0.00–0.10)

## 2022-06-24 LAB — TSH: TSH: 1.49 u[IU]/mL (ref 0.35–4.94)

## 2022-06-24 LAB — VITAMIN D, 25 OH, TOTAL: Vitamin D, 25 OH, Total: 41 ng/mL (ref 30–100)

## 2022-06-24 MED ORDER — HYDRALAZINE HCL 20 MG/ML IJ SOLN
5.0000 mg | INTRAMUSCULAR | Status: DC | PRN
Start: 2022-06-24 — End: 2022-06-24
  Administered 2022-06-24: 5 mg via INTRAVENOUS
  Filled 2022-06-24: qty 1

## 2022-06-24 MED ORDER — ONDANSETRON HCL 4 MG/2ML IJ SOLN
4.0000 mg | Freq: Four times a day (QID) | INTRAMUSCULAR | Status: DC | PRN
Start: 2022-06-24 — End: 2022-06-29
  Administered 2022-06-25: 4 mg via INTRAVENOUS
  Filled 2022-06-24: qty 2

## 2022-06-24 MED ORDER — VANCOMYCIN PHARMACY TO DOSE PLACEHOLDER
INTRAVENOUS | Status: DC
Start: 2022-06-24 — End: 2022-06-24

## 2022-06-24 MED ORDER — METHOCARBAMOL 500 MG PO TABS
500.0000 mg | ORAL_TABLET | Freq: Two times a day (BID) | ORAL | Status: DC
Start: 2022-06-24 — End: 2022-06-26
  Administered 2022-06-24 – 2022-06-25 (×3): 500 mg via ORAL
  Filled 2022-06-24 (×4): qty 1

## 2022-06-24 MED ORDER — CEFEPIME 2 GM MBP (CNR)
2.0000 g | Freq: Two times a day (BID) | Status: DC
Start: 2022-06-24 — End: 2022-06-24
  Administered 2022-06-24 (×2): 2 g via INTRAVENOUS
  Filled 2022-06-24 (×4): qty 100

## 2022-06-24 MED ORDER — MAGNESIUM SULFATE IN D5W 1-5 GM/100ML-% IV SOLN
1.0000 g | Freq: Once | INTRAVENOUS | Status: AC
Start: 2022-06-24 — End: 2022-06-24
  Administered 2022-06-24: 1 g via INTRAVENOUS
  Filled 2022-06-24: qty 100

## 2022-06-24 MED ORDER — VANCOMYCIN HCL IN NACL 1.75-0.9 GM/500ML-% IV SOLN
20.0000 mg/kg | Freq: Once | INTRAVENOUS | Status: AC
Start: 2022-06-24 — End: 2022-06-24
  Administered 2022-06-24: 1750 mg via INTRAVENOUS
  Filled 2022-06-24: qty 500

## 2022-06-24 MED ORDER — METRONIDAZOLE IN NACL 500 MG/100 ML IV SOLN (WRAP)
500.0000 mg | Freq: Three times a day (TID) | INTRAVENOUS | Status: DC
Start: 2022-06-24 — End: 2022-06-24
  Administered 2022-06-24 (×2): 500 mg via INTRAVENOUS
  Filled 2022-06-24 (×5): qty 100

## 2022-06-24 MED ORDER — VANCOMYCIN 500 MG IN 100 ML NS MBP (CNR)
500.0000 mg | Freq: Two times a day (BID) | Status: DC
Start: 2022-06-24 — End: 2022-06-24
  Filled 2022-06-24 (×2): qty 100

## 2022-06-24 MED ORDER — ONDANSETRON HCL 4 MG PO TABS
4.0000 mg | ORAL_TABLET | Freq: Four times a day (QID) | ORAL | Status: DC | PRN
Start: 2022-06-24 — End: 2022-06-29

## 2022-06-24 NOTE — Plan of Care (Signed)
Problem: Pain interferes with ability to perform ADL  Goal: Pain at adequate level as identified by patient  Outcome: Progressing  Flowsheets (Taken 06/24/2022 1030)  Pain at adequate level as identified by patient:   Identify patient comfort function goal   Assess pain on admission, during daily assessment and/or before any "as needed" intervention(s)   Reassess pain within 30-60 minutes of any procedure/intervention, per Pain Assessment, Intervention, Reassessment (AIR) Cycle   Assess for risk of opioid induced respiratory depression, including snoring/sleep apnea. Alert healthcare team of risk factors identified.     Problem: Side Effects from Pain Analgesia  Goal: Patient will experience minimal side effects of analgesic therapy  Outcome: Progressing  Flowsheets (Taken 06/24/2022 1030)  Patient will experience minimal side effects of analgesic therapy:   Monitor/assess patient's respiratory status (RR depth, effort, breath sounds)   Prevent/manage side effects per LIP orders (i.e. nausea, vomiting, pruritus, constipation, urinary retention, etc.)   Assess for changes in cognitive function     Problem: Moderate/High Fall Risk Score >5  Goal: Patient will remain free of falls  Outcome: Progressing  Flowsheets (Taken 06/24/2022 1030)  VH High Risk (Greater than 13):   ALL REQUIRED LOW INTERVENTIONS   ALL REQUIRED MODERATE INTERVENTIONS   Keep door open for better visibility

## 2022-06-24 NOTE — Consults (Signed)
History and Physical    Date Time: 06/24/22 3:58 PM  Patient Name: Vanessa Kelly  Attending Physician: Maye Hides, MD    Assessment:   Hearing Loss  Headache  HTN  Sphenoid/ max sinusitis noted on MRI    Plan:   Audiogram in office upon discharge  Rec f/u in office for cerumen debridement  Will follow sinus issues as outpatient      History of Present Illness:   Vanessa Kelly is a 82 y.o. female who presents to the hospital with headache and HTN.  She was noted to have fluid in the right mastoid air cells and ENT was contacted.  There is no history of chronic ear infections but she does report decreased hearing bilaterally and LEFT ear pain.  Chief Complaint   Patient presents with    Hypertension    Headache       Past Medical History:     Past Medical History:   Diagnosis Date    Diabetes mellitus     Gastroesophageal reflux disease     Hyperlipidemia     Hypertension     Seasonal allergic rhinitis        Past Surgical History:     Past Surgical History:   Procedure Laterality Date    BREAST BIOPSY Right     2014    HYSTERECTOMY      partial-1970's       Family History:     Family History   Problem Relation Age of Onset    Heart failure Mother     Myocardial Infarction Father     Breast cancer Sister        Social History:     Social History     Socioeconomic History    Marital status: Married     Spouse name: Not on file    Number of children: Not on file    Years of education: Not on file    Highest education level: Not on file   Occupational History    Not on file   Tobacco Use    Smoking status: Former     Types: Cigarettes     Quit date: 1990     Years since quitting: 34.1    Smokeless tobacco: Never   Vaping Use    Vaping Use: Never used   Substance and Sexual Activity    Alcohol use: Yes     Comment: occasionally    Drug use: Never    Sexual activity: Not on file   Other Topics Concern    Not on file   Social History Narrative    Not on file     Social Determinants of Health     Financial  Resource Strain: Low Risk  (06/24/2022)    Overall Financial Resource Strain (CARDIA)     Difficulty of Paying Living Expenses: Not hard at all   Food Insecurity: No Food Insecurity (06/23/2022)    Hunger Vital Sign     Worried About Running Out of Food in the Last Year: Never true     [place] in the Last Year: Never true   Transportation Needs: No Transportation Needs (06/24/2022)    PRAPARE - Transportation     Lack of Transportation (Medical): No     Lack of Transportation (Non-Medical): No   Physical Activity: Not on file   Stress: Not on file   Social Connections: Not on file   Intimate Partner Violence: Not At  Risk (06/23/2022)    Humiliation, Afraid, Rape, and Kick questionnaire     Fear of Current or Ex-Partner: No     Emotionally Abused: No     Physically Abused: No     Sexually Abused: No   Housing Stability: Unknown (06/24/2022)    Housing Stability Vital Sign     Unable to Pay for Housing in the Last Year: No     Number of Places Lived in the Last Year: Not on file     Unstable Housing in the Last Year: No       Allergies:     Allergies   Allergen Reactions    Fentanyl Anaphylaxis     Hives    Percolone [Oxycodone]        Medications:     Medications Prior to Admission   Medication Sig    acetaminophen (TYLENOL) 325 MG tablet Take 2 tablets (650 mg) by mouth every 6 (six) hours as needed for Pain (or headache)    albuterol sulfate HFA (PROVENTIL) 108 (90 Base) MCG/ACT inhaler     aspirin EC 81 MG EC tablet Take 1 tablet (81 mg) by mouth daily    calcium carbonate (TUMS) 500 MG chewable tablet Chew 1 tablet (500 mg) by mouth every 6 (six) hours as needed for Heartburn    candesartan (ATACAND) 4 MG tablet Take 1 tablet (4 mg) by mouth daily as needed (SBP > 160)    ergocalciferol (ERGOCALCIFEROL) 1.25 MG (50000 UT) capsule Take 1 capsule (50,000 Units) by mouth once a week On Saturday    fexofenadine (ALLEGRA) 180 MG tablet Take 1 tablet (180 mg) by mouth daily    fluticasone (Flonase Sensimist) 27.5  MCG/SPRAY nasal spray 1 spray by Nasal route daily    lidocaine (LIDODERM) 5 % Place 1 patch onto the skin every 24 hours Remove & Discard patch within 12 hours or as directed by MD    lidocaine (LIDODERM) 5 % Place 1 patch onto the skin every 24 hours Remove & Discard patch within 12 hours or as directed by MD    lovastatin (MEVACOR) 40 MG tablet Take 1 tablet (40 mg) by mouth nightly    methocarbamol (ROBAXIN) 750 MG tablet Take 1 tablet (750 mg) by mouth 3 (three) times daily as needed (for spasm)    midodrine (PROAMATINE) 5 MG tablet Take 1 tablet (5 mg) by mouth 3 (three) times daily    Multiple Vitamins-Minerals (Centrum Silver 50+Women) Tab Take 1 tablet by mouth daily    pantoprazole (PROTONIX) 40 MG tablet Take 1 tablet (40 mg) by mouth every morning before breakfast    polyethylene glycol (MIRALAX) 17 g packet Take 17 g by mouth daily    ranolazine (RANEXA) 500 MG 12 hr tablet Take 1 tablet (500 mg) by mouth 2 (two) times daily    sucralfate (CARAFATE) 1 g tablet Take 1 tablet (1 g) by mouth every 6 (six) hours as needed (abdominal pain)    traMADol (ULTRAM) 50 MG tablet Take 1 tablet (50 mg) by mouth every 8 (eight) hours as needed for Pain    venlafaxine (EFFEXOR-XR) 37.5 MG 24 hr capsule Take 2 capsules (75 mg) by mouth nightly    zolpidem (AMBIEN) 5 MG tablet Take 1 tablet (5 mg) by mouth nightly    [DISCONTINUED] docusate sodium (COLACE) 100 MG capsule Take 1 capsule (100 mg total) by mouth daily       Current Facility-Administered Medications   Medication Dose Route Frequency  Provider Last Rate Last Admin    acetaminophen (TYLENOL) tablet 650 mg  650 mg Oral Q4H PRN Rushie Chestnut, PA   650 mg at 06/24/22 1027    albuterol sulfate HFA (PROVENTIL) inhaler 1 puff  1 puff Inhalation Q4H PRN Rushie Chestnut, PA        aspirin EC tablet 81 mg  81 mg Oral Daily Rushie Chestnut, [place]   81 mg at 06/24/22 [id number]    atorvastatin (LIPITOR) tablet 10 mg  10 mg Oral Daily Rushie Chestnut, [place]   10 mg at 06/24/22 [id number]     benzocaine-menthol (CEPACOL/CHLORASEPTIC) lozenge 1 lozenge  1 lozenge Buccal Q2H PRN Rushie Chestnut, PA        benzonatate (TESSALON) capsule 100 mg  100 mg Oral TID PRN Rushie Chestnut, PA        calcium carbonate (TUMS) chewable tablet 500 mg  1 tablet Oral Q6H PRN Rushie Chestnut, PA        carboxymethylcellulose (PF) (REFRESH PLUS) 0.5 % ophthalmic solution 1 drop  1 drop Both Eyes TID PRN Rushie Chestnut, PA        cetirizine (ZyrTEC) tablet 10 mg  10 mg Oral Daily Rushie Chestnut, [place]   10 mg at 06/24/22 [id number]    dextrose (GLUCOSE) 40 % oral gel 15 g of glucose  15 g of glucose Oral PRN Rushie Chestnut, PA        Or    dextrose (D10W) 10% bolus 125 mL  12.5 g Intravenous PRN Rushie Chestnut, PA        Or    dextrose 50 % bolus 12.5 g  12.5 g Intravenous PRN Rushie Chestnut, PA        Or    glucagon (rDNA) (GLUCAGEN) injection 1 mg  1 mg Intramuscular PRN Rushie Chestnut, PA        fluticasone [facility]) 50 MCG/ACT nasal spray 2 spray  2 spray Each Nare Daily Rushie Chestnut, [place]   2 spray at 06/24/22 0832    [START ON 06/25/2022] heparin (porcine) injection 5,000 Units  5,000 Units Subcutaneous Q12H Navos Humphrey Rolls, [place], [place]        hydrALAZINE (APRESOLINE) injection 5 mg  5 mg Intravenous Q4H PRN Rushie Chestnut, PA   5 mg at 06/24/22 1338    lidocaine (LIDODERM) 5 % 1 patch  1 patch Transdermal Q24H PRN Rushie Chestnut, PA        magnesium sulfate 1g in dextrose 5% 167m IVPB (premix)  1 g Intravenous PRN KRushie Chestnut PA        melatonin tablet 3 mg  3 mg Oral QHS PRN KRushie Chestnut PA   3 mg at 06/24/22 0017    midodrine (PROAMATINE) tablet 5 mg  5 mg Oral TID KRushie Chestnut PA   5 mg at 06/24/22 [id number]   naloxone ([facility] injection 0.2 mg  0.2 mg Intravenous PRN KRushie Chestnut PA        ondansetron ([facility] tablet 4 mg  4 mg Oral Q6H PRN AMaye Hides MD        Or    ondansetron ([facility] injection 4 mg  4 mg Intravenous Q6H PRN AMaye Hides MD        pantoprazole (PROTONIX) EC tablet 40 mg  40 mg Oral QAM AC Khan, [place]  40 mg at 06/24/22 [id number]   polyethylene  glycol (MIRALAX) packet 17 g  17 g Oral Daily KRushie Chestnut [place]  17 g at 06/24/22 [phone]  potassium & sodium phosphates (PHOS-NAK) 280-160-250 MG packet 2 packet  2 packet Oral PRN Humphrey Rolls, [place], PA        potassium chloride (KLOR-CON M20) CR tablet 0-40 mEq  0-40 mEq Oral PRN Rushie Chestnut, PA        And    potassium chloride 10 mEq in 100 mL IVPB (premix)  10 mEq Intravenous PRN Rushie Chestnut, PA        ranolazine (RANEXA) 12 hr tablet 500 mg  500 mg Oral BID Rushie Chestnut, PA   500 mg at 06/24/22 [id number]    saline (OCEAN NASAL SPRAY) 0.65 % nasal solution 2 spray  2 spray Each Nare Q4H PRN Rushie Chestnut, PA        valsartan (DIOVAN) tablet 40 mg  40 mg Oral Daily Rushie Chestnut, [place]   40 mg at 06/24/22 [id number]    venlafaxine (EFFEXOR-XR) 24 hr capsule 75 mg  75 mg Oral QHS Humphrey Rolls, [place], [place]   75 mg at 06/24/22 0018    [START ON 06/25/2022] vitamin D (ergocalciferol) (DRISDOL) capsule 50,000 Units  50,000 Units Oral Weekly Rushie Chestnut, PA        vitamins/minerals tablet 1 tablet  1 tablet Oral Daily Rushie Chestnut, [place]   1 tablet at 06/24/22 [id number]       Review of Systems:   A comprehensive review of systems was: History obtained from the patient  General ROS: negative for - chills, fever, malaise or sleep disturbance  Ophthalmic ROS: double vision  ENT ROS: headaches, hearing change, left ear pain, nasal congestion,   Endocrine ROS: negative for - temperature intolerance  Respiratory ROS: negative for - cough, hemoptysis, shortness of breath or stridor  Cardiovascular ROS: no chest pain or dyspnea on exertion  Gastrointestinal ROS: abdominal pain  Genito-Urinary ROS: no dysuria, trouble voiding  Musculoskeletal ROS: weakness  Neurological ROS: negative for - dizziness   Dermatological ROS: negative for pruritus and rash  Physical Exam:     Vitals:    06/24/22 1330   BP: 167/61   Pulse: (!) 57   Resp: 15   Temp: 98.2 F (36.8 C)   SpO2: 98%       Intake and Output Summary (Last 24 hours) at Date Time    Intake/Output Summary (Last 24 hours)  at 06/24/2022 1558  Last data filed at 06/23/2022 2005  Gross per 24 hour   Intake 100 ml   Output --   Net 100 ml       General appearance - alert, well appearing, and in no distress  Mental status - alert, oriented to person, place, and time  Eyes -extraocular eye movements intact  Ears - external ear canals normal with cerumen noted on right, Auricles normal, no discharge no erythema  Nose - normal and patent, no erythema, no discharge   Mouth - mucous membranes moist,   Neck - supple, no significant adenopathy  Abdomen -  nondistended  Musculoskeletal - no joint tenderness, deformity or swelling  Extremities -no clubbing,cyanosis or edema  Skin - normal coloration and turgor, no rashes  Labs:     Results       Procedure Component Value Units Date/Time    Vitamin B12 [id number] Collected: 06/24/22 0620    Specimen: Blood Updated: 06/24/22 1030     Vitamin B-12 673 pg/mL     Narrative:      This is NOT the correct Test for Patients with  Hemoglobinopathy.    Vitamin D,25 OH,  Total [id number] Collected: 06/24/22 0620    Specimen: Blood Updated: 06/24/22 1024     Vitamin D, 25 OH, Total 41 ng/mL     Narrative:      This is NOT the correct Test for Patients with  Hemoglobinopathy.    IRON PROFILE [id number]  (Abnormal) Collected: 06/24/22 0620     Updated: 06/24/22 1011     Iron 43 ug/dL      UIBC 163 ug/dL      TIBC 206 ug/dL      Iron Saturation 21 %     Narrative:      This is NOT the correct Test for Patients with  Hemoglobinopathy.    Lipid panel [id number]  (Abnormal) Collected: 06/24/22 0620    Specimen: Blood Updated: 06/24/22 1011     Cholesterol 163 mg/dL      Triglycerides 39 mg/dL      HDL 74 mg/dL      LDL Calculated 81 mg/dL      VLDL Calculated 8 mg/dL      Cholesterol / HDL Ratio 2.2 Index     Narrative:      This is NOT the correct Test for Patients with  Hemoglobinopathy.    Hemolysis index [id number] Collected: 06/24/22 0620     Updated: 06/24/22 1011     Hemolysis Index 9 Index     Narrative:       This is NOT the correct Test for Patients with  Hemoglobinopathy.    Hemoglobin A1C [id number] Collected: 06/24/22 0620    Specimen: Blood Updated: 06/24/22 0950     Hemoglobin A1C 5.4 %      Average Estimated Glucose 108.3 mg/dL     Narrative:      This is NOT the correct Test for Patients with  Hemoglobinopathy.    Culture Blood Aerobic and Anaerobic [id number] Collected: 06/24/22 0620    Specimen: Blood, Venipuncture Updated: 06/24/22 0935    Narrative:      The order will result in two separate 8-51m bottles  Please do NOT order repeat blood cultures if one has been  drawn within the last 48 hours  UNLESS concerned for  endocarditis  AVOID BLOOD CULTURE DRAWS FROM CENTRAL LINE IF POSSIBLE  Indications:->Bacteremia  1 BLUE+1 PURPLE    Culture Blood Aerobic and Anaerobic [[id number]: 06/24/22 0629    Specimen: Blood, Venipuncture Updated: 06/24/22 0935    Narrative:      The order will result in two separate 8-146mbottles  Please do NOT order repeat blood cultures if one has been  drawn within the last 48 hours  UNLESS concerned for  endocarditis  AVOID BLOOD CULTURE DRAWS FROM CENTRAL LINE IF POSSIBLE  Indications:->Bacteremia  1 BLUE+1 PURPLE    TSH [[id number]: 06/24/22 0620    Specimen: Blood Updated: 06/24/22 0745     TSH 1.49 uIU/mL     Narrative:      This is NOT the correct Test for Patients with  Hemoglobinopathy.    Basic Metabolic Panel [[id number](Abnormal) Collected: 06/24/22 0620    Specimen: Blood Updated: 06/24/22 0723     Glucose 149 mg/dL      BUN 17.0 mg/dL      Creatinine 1.0 mg/dL      Calcium 9.5 mg/dL      Sodium 139 mEq/L      Potassium 4.6 mEq/L      Chloride 107 mEq/L      CO2 23 mEq/L  Anion Gap 9.0     eGFR 55.5 mL/min/1.73 m2     Procalcitonin [id number] Collected: 06/23/22 1841     Updated: 06/24/22 0721     Procalcitonin 0.04 ng/ml     CBC and differential [id number]  (Abnormal) Collected: 06/24/22 0620    Specimen: Blood Updated: 06/24/22 0707      WBC 6.98 x10 3/uL      Hgb 9.5 g/dL      Hematocrit 28.9 %      Platelets 177 x10 3/uL      RBC 2.88 x10 6/uL      MCV 100.3 fL      MCH 33.0 pg      MCHC 32.9 g/dL      RDW 13 %      MPV 10.7 fL      Instrument Absolute Neutrophil Count 4.97 x10 3/uL      Neutrophils 71.2 %      Lymphocytes Automated 20.5 %      Monocytes 5.9 %      Eosinophils Automated 1.7 %      Basophils Automated 0.4 %      Immature Granulocytes 0.3 %      Nucleated RBC 0.0 /100 WBC      Neutrophils Absolute 4.97 x10 3/uL      Lymphocytes Absolute Automated 1.43 x10 3/uL      Monocytes Absolute Automated 0.41 x10 3/uL      Eosinophils Absolute Automated 0.12 x10 3/uL      Basophils Absolute Automated 0.03 x10 3/uL      Immature Granulocytes Absolute 0.02 x10 3/uL      Absolute NRBC 0.00 x10 3/uL     C Reactive Protein [id number] Collected: 06/23/22 1841    Specimen: Blood Updated: 06/24/22 0129     C-Reactive Protein 0.6 mg/dL     Sedimentation rate (ESR) [id number]  (Abnormal) Collected: 06/23/22 1841    Specimen: Blood Updated: 06/24/22 0124     Sed Rate 40 mm/Hr     High Sensitivity Troponin-I [id number] Collected: 06/23/22 1841    Specimen: Blood Updated: 06/23/22 1921     hs Troponin-I 6.7 ng/L     Comprehensive metabolic panel [id number]  (Abnormal) Collected: 06/23/22 1841    Specimen: Blood Updated: 06/23/22 1914     Glucose 103 mg/dL      BUN 18.0 mg/dL      Creatinine 1.1 mg/dL      Sodium 140 mEq/L      Potassium 4.5 mEq/L      Chloride 107 mEq/L      CO2 23 mEq/L      Calcium 10.3 mg/dL      Protein, Total 7.5 g/dL      Albumin 3.7 g/dL      AST (SGOT) 24 U/L      ALT 20 U/L      Alkaline Phosphatase 85 U/L      Bilirubin, Total 0.3 mg/dL      Globulin 3.8 g/dL      Albumin/Globulin Ratio 1.0     Anion Gap 10.0     eGFR 49.5 mL/min/1.73 m2     CBC and differential [id number]  (Abnormal) Collected: 06/23/22 1841    Specimen: Blood Updated: 06/23/22 1858     WBC 6.20 x10 3/uL      Hgb 10.3 g/dL      Hematocrit 30.4 %       Platelets 185 x10 3/uL      RBC 3.08 x10  6/uL      MCV 98.7 fL      MCH 33.4 pg      MCHC 33.9 g/dL      RDW 13 %      MPV 10.5 fL      Instrument Absolute Neutrophil Count 3.05 x10 3/uL      Neutrophils 49.2 %      Lymphocytes Automated 33.4 %      Monocytes 7.7 %      Eosinophils Automated 9.0 %      Basophils Automated 0.5 %      Immature Granulocytes 0.2 %      Nucleated RBC 0.0 /100 WBC      Neutrophils Absolute 3.05 x10 3/uL      Lymphocytes Absolute Automated 2.07 x10 3/uL      Monocytes Absolute Automated 0.48 x10 3/uL      Eosinophils Absolute Automated 0.56 x10 3/uL      Basophils Absolute Automated 0.03 x10 3/uL      Immature Granulocytes Absolute 0.01 x10 3/uL      Absolute NRBC 0.00 x10 3/uL               Rads:   Radiological Procedure reviewed.    Mammo Screening 3D/Tomo Bilateral    Result Date: 06/24/2022  HISTORY: Screening mammogram. COMPARISON: Priors dating back to 04/13/2015 PARENCHYMAL PATTERN: There are scattered areas of fibroglandular density. FINDINGS:    No suspicious masses, suspicious calcifications or areas of architectural distortion.     No mammographic evidence of malignancy.     A LETTER WAS GENERATED TO THE PATIENT INDICATING THESE RESULTS.                                                             RECOMMENDATION:  SOCIETY OF BREAST IMAGING AND [employer] COLLEGE OF RADIOLOGY RECOMMENDATIONS FOR IMAGING SCREENING FOR BREAST CANCER  Yearly screening mammogram from age 75* * for women at average risk of breast cancer. To see the full SBI/ACR recommendations go to http://[email or web address]/Facts/Guidelines BIRADS CATEGORY 01-NEGATIVE MAMMOGRAM (1A) Lennie Hummer, MD 06/24/2022 12:54 PM    MRI Brain with/without Contrast    Result Date: 06/23/2022  HISTORY: Headache, new or worsening (Age >= 50y). headache, blurry vision. COMPARISON: Earlier same day head CT. Multiple priors, including brain MRI from 01/26/2021. TECHNIQUE: MRI of the brain performed on a 1.5 Tesla scanner  without and with intravenous contrast. Examination performed per routine brain imaging protocol. CONTRAST: gadoterate meglumine (CLARISCAN) 7.5 MMOL/15ML injection 15 mL FINDINGS: No restricted diffusion/acute infarct is identified. 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 Scattered small foci of T2/FLAIR hyperintensity are present in the periventricular and subcortical white matter, consistent with chronic small vessel disease. There is generalized brain volume loss. A tiny focus of susceptibility artifact at the left frontal white matter could reflect a prior microhemorrhage versus prominent blood vessel and is unchanged from prior MRI. No discrete extra-axial collection is identified. The ventricles have normal size and configuration. No substantial mass effect is evident. There is abnormal T2 signal throughout the visualized right internal carotid artery, different from MRI in 2022 and suggestive of substantial flow limitation. Expected arterial flow-related signal voids are visible elsewhere. The dural venous sinuses are patent. No abnormal brain or  meningeal enhancement is identified after contrast administration. No marrow replacing lesions are evident in the calvarium. There is hyperostosis frontalis interna. Fluid is present in the right mastoid air cells. The right sphenoid sinus is filled. Bony wall hyperostosis is consistent with chronic disease. There is mild mucosal thickening/debris in the left sphenoid sinus and right posterior ethmoid. There are bilateral lens replacements.      1.No acute infarct, evidence of hemorrhage, extra-axial collections, hydrocephalus, abnormal enhancement, or mass effect. 2.Abnormal T2 signal in the right internal carotid artery, different from MRI in 2022 and concerning for substantial flow limitation. MRA or CTA is recommended. 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 4.Background atrophy and mild white  matter changes of chronic small vessel disease. 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 6.Nonspecific fluid in right mastoid air cells. Herbert Seta MD, MD 06/23/2022 9:21 PM    CT Head WO Contrast    Result Date: 06/23/2022  HISTORY: headache. COMPARISON: Head CT on 10/14/2021 TECHNIQUE: CT of the head performed without intravenous contrast. The following dose reduction techniques were utilized: automated exposure control and/or adjustment of the mA and/or KV according to patient size, and the use of an iterative reconstruction technique. CONTRAST: None. FINDINGS: There is no acute hemorrhage. The ventricles and subarachnoid spaces are proportionately enlarged reflecting volume loss.  No acute territorial infarct is identified. Mild periventricular hypodensity is expected for age. No mass or mass effect. Basal cisterns are patent. Intracranial vascular calcifications are identified. No aggressive osseous lesion. Near-complete opacified right maxillary and left sphenoid sinuses with evidence of chronic sinusitis which is unchanged compared to prior. Near-complete the opacified right mastoid air cells an partially opacified left mastoid air cells which is increased compared to prior. There is bilateral lens replacement.      No CT evidence of acute intracranial hemorrhage, herniation, or hydrocephalus. Worsening mastoid effusions bilaterally as detailed above. Nelya Ebadirad 06/23/2022 7:36 PM    CT abdomen pelvis without contrast    Result Date: 05/31/2022  HISTORY:  Pain COMPARISON: 01/18/2022 CT demonstrated colonic diverticulosis, large hiatal hernia and a 1.2 cm cyst in the left kidney. TECHNIQUE: CT of the abdomen and pelvis performed without intravenous contrast. The following dose reduction techniques were utilized: automated exposure control and/or adjustment of the mA and/or KV according to patient size, and the use of an iterative reconstruction  technique. CONTRAST: None. FINDINGS: Vascular and organ detail is limited without IV contrast. Lung bases-stable interstitial and pleural thickening at the left lung base. No acute infiltrate. Large hiatal hernia. No pericardial effusion. Liver-unremarkable. Gallbladder-surgically removed. Spleen-unremarkable. Adrenal glands-unremarkable. Pancreas-noncontrast images are unremarkable. Retroperitoneum-aortic calcification. Dense calcification of the origin the left renal artery. Normal caliber vessels. No retrocrural or retroperitoneal adenopathy. No iliac or significant inguinal adenopathy. Kidneys-stable cyst in the medial left kidney measuring 0.9 cm. Stable cyst in the posterior left kidney measuring 1.2 cm. No stone or obstruction. Bladder-unremarkable. Bowel-sigmoid diverticulosis. No bowel obstruction or inflammation. Normal terminal ileum. Normal appendix region. Appendix not definitively seen. Tiny umbilical hernia containing fat only. Bone-degenerated disc at L5-S1. Stable sclerotic changes in the all of the vertebral body. No fracture or osseous destruction.     1. No evidence of acute inflammation or obstruction. 2. Large hiatal hernia. 3. Colonic diverticulosis. 4. Stable left renal cysts. 5. Stable interstitial and pleural changes at the left lung base. 6. Other incidental findings as described above Jennell Corner, MD 05/31/2022 11:13 AM  Signed by: Candie Chroman, PA-C

## 2022-06-24 NOTE — Progress Notes (Signed)
Obs given

## 2022-06-24 NOTE — Progress Notes (Signed)
Initial Pharmacy Vancomycin Dosing Consult:  Day 1    Patient Parameters  Age: 82 y.o.  Wt Readings from Last 1 Encounters:   06/23/22 84.8 kg (186 lb 15.2 oz)     Serum creatinine: 1.1 mg/dL (H) 06/23/22 1841  Estimated creatinine clearance: 42.6 mL/min (A)    Pertinent Cultures:  Date Source  Organism & Pertinent Susceptibilities     06-24-22 MRSA Screening  (Nares & Throat) ordered        As appropriate, contact physician to consider change in therapy if cultures grow organism other than MRSA     Assessment   Vancomycin therapy is for empiric treatment of Mastoiditis  Vancomycin Monitoring Goal:  AUC/MIC 400-600  Renal function assessment - Vancomycin: No concern for renal function changes as Scr and/or urine output are stable. CKD IIIa, SCr at baseline  Will dose vancomycin with vancomycin load then scheduled maintenance doses    [facility]  Loading dose: 1750 mg at 03:27 06/24/2022.  Regimen: 500 mg IV every 12 hours.  Start time: 18:00 on 06/24/2022  Exposure target: AUC24 (range)400-600 mg/L.hr   AUC24,ss: 432 mg/L.hr  Probability of AUC24 > 400: 59 %  Ctrough,ss: 15.4 mg/L  Probability of Ctrough,ss > 20: 25 %  Probability of nephrotoxicity (Lodise CID 2009): 11 %    Plan:   Start vancomycin with load of 1750 mg and then a maintenance dose of 500 mg every Q12H starting on   Vancomycin level has been ordered for 06-26-22 0400  SCr daily for first 3 days then reassess continued need for daily Scr monitoring  A pharmacist will continue to follow daily     Thank you,  Birdena Crandall, [facility]  Phone: [phone]

## 2022-06-24 NOTE — Nursing Progress Note (Signed)
Patient A&Ox4. VSS. No acute distress noted. Tolerates diet well, no complaints of nausea. No pain reported. IV antibiotics given as ordered, now discontinued. PRN Tylenol given for headache. MRI forms completed. Call bell within reach, safety precautions in place.

## 2022-06-24 NOTE — Consults (Signed)
NEUROLOGY CONSULTATION    Date Time: 06/24/22 3:09 PM  Patient Name: Vanessa Kelly  Attending Physician: Maye Hides, MD      Assessment & Plan:   Headaches, with some migraine features, ongoing for the last 3 to 4 days, fluctuating, in intensity, and intermittent headaches, not sure about etiology, could be due to occipital neuralgia muscular spasm resulting in pain, could be due to hypertensive emergency given widely very wide variation in blood pressure readings  Wait on vascular imaging studies given abnormality seen on brain MRI suggesting a stenosis in the right ICA MRV has been ordered as well given patient was complaining of vision problems and diplopia associated with her headaches  Will address patient's blood pressure management medications, with primary care team and make adjustments accordingly  Check orthostatics here  Check B12  Therapeutic trial with IV magnesium, and Robaxin  Will follow-up        History of Present Illness:   Neurology consultation requested by:-->Dr Rush Landmark   82 year old lady, comes into the hospital, for evaluation of worsening headaches, ongoing for the last 3 to 4 weeks, she reports intermittent pain, primarily in the periorbital region more so on the right as compared to the left, described as throbbing with some light sensitivity, no burning or sharp shooting quality to it.  She also reports intermittent worsening of blood pressures, where blood pressure was elevated quite high in the 1 80-200 range and is she decided to come into the hospital    Patient has a prior history of orthostatic hypotension as well    Recent Labs   Lab 06/24/22  0620   Hemoglobin A1C 5.4     Recent Labs   Lab 06/24/22  0620   Cholesterol 163   Triglycerides 39   HDL 74   LDL Calculated 81     Recent Labs   Lab 06/24/22  0620   LDL Calculated 81        Past Medical History:     Past Medical History:   Diagnosis Date    Diabetes mellitus     Gastroesophageal reflux disease     Hyperlipidemia      Hypertension     Seasonal allergic rhinitis        Meds:    home meds   Current medications aspirin Lipitor Zyrtec Flonase midodrine Zofran Protonix Diovan Effexor vitamin D    Allergies   Allergen Reactions    Fentanyl Anaphylaxis     Hives    Percolone [Oxycodone]        Social & Family History:     Social History     Socioeconomic History    Marital status: Married   Tobacco Use    Smoking status: Former     Types: Cigarettes     Quit date: 1990     Years since quitting: 34.1    Smokeless tobacco: Never   Vaping Use    Vaping Use: Never used   Substance and Sexual Activity    Alcohol use: Yes     Comment: occasionally    Drug use: Never     Social Determinants of Health     Financial Resource Strain: Low Risk  (06/24/2022)    Overall Financial Resource Strain (CARDIA)     Difficulty of Paying Living Expenses: Not hard at all   Food Insecurity: No Food Insecurity (06/23/2022)    Hunger Vital Sign     Worried About Running Out of Food in the Last  Year: Never true     [place] in the Last Year: Never true   Transportation Needs: No Transportation Needs (06/24/2022)    PRAPARE - Armed forces logistics/support/administrative officer (Medical): No     Lack of Transportation (Non-Medical): No   Intimate Partner Violence: Not At Risk (06/23/2022)    Humiliation, Afraid, Rape, and Kick questionnaire     Fear of Current or Ex-Partner: No     Emotionally Abused: No     Physically Abused: No     Sexually Abused: No   Housing Stability: Unknown (06/24/2022)    Housing Stability Vital Sign     Unable to Pay for Housing in the Last Year: No     Unstable Housing in the Last Year: No       Family History   Problem Relation Age of Onset    Heart failure Mother     Myocardial Infarction Father     Breast cancer Sister            CODE STATUS: Full code  I personally reviewed all of the medications.  Medication list generated using all available resources.  Elder abuse (physical)  - negative  Advanced care plan - reviewed from chart or in  discussion with pt or family    Review of Systems:   No eye, ear nose, throat problems; no coughing or wheezing or shortness of breath, No chest pain or orthopnea, no abdominal pain, nausea or vomiting, No pain in the body or extremities, no psychiatric, neurological, endocrine, hematological or cardiac complaints except as noted above.         Physical Exam:   Blood pressure 167/61, pulse (!) 57, temperature 98.2 F (36.8 C), temperature source Oral, resp. rate 15, height 1.702 m ('5\' 7"'$ ), weight 84.8 kg (186 lb 15.2 oz), SpO2 98 %.    HEENT: Normocephalic.no carotid bruitsMild discomfort----on Palpation of the paracervical regions more so towards the left none on the right no temporal area tenderness appreciated  Lungs:  CTA bil  Abd Soft   Cardiac:  S1,S2, normal rate and rhythm  Neck: supple, no cartoid bruits  Extremities: no edema  Skin: no rashes seen in exposed areas     Neuro:  Level of consciousness:  Alert and appropriate  Oriented:  X 3  Cognition:  Intact naming, recognition, concentration and following complex commands  Cranial Nerves:  II-XII intact  Strength:  No upper extremity drift, 5/5 strength x 4 extremities  Coordination:  Intact FTN testingMild postural tremor in the left upper extremity  Reflexes:  +1 throughout, down going toes bil  Sensation: Intact x 4 extremities to LT, temp,  Labs:     Recent Labs   Lab 06/24/22  0620 06/23/22  1841   Glucose 149* 103*   BUN 17.0 18.0   Creatinine 1.0 1.1*   Calcium 9.5 10.3*   Sodium 139 140   Potassium 4.6 4.5   Chloride 107 107   CO2 23 23   Albumin  --  3.7   AST (SGOT)  --  24   ALT  --  20   Bilirubin, Total  --  0.3   Alkaline Phosphatase  --  85     Recent Labs   Lab 06/24/22  0620 06/23/22  1841   WBC 6.98 6.20   Hgb 9.5* 10.3*   Hematocrit 28.9* 30.4*   MCV 100.3* 98.7*   MCH 33.0 33.4   MCHC 32.9  33.9   Platelets 177 185         No results for input(s): "PTT", "PT", "INR" in the last 72 hours.       Radiology Results (24 Hour)       Procedure  Component Value Units Date/Time    MRI Brain with/without Contrast [id number] Collected: 06/23/22 2110    Order Status: Completed Updated: 06/23/22 2124    Narrative:      HISTORY: Headache, new or worsening (Age >= 50y). headache, blurry vision.     COMPARISON: Earlier same day head CT. Multiple priors, including brain MRI  from 01/26/2021.    TECHNIQUE: MRI of the brain performed on a 1.5 Tesla scanner without and  with intravenous contrast. Examination performed per routine brain imaging  protocol.     CONTRAST: gadoterate meglumine (CLARISCAN) 7.5 MMOL/15ML injection 15 mL    FINDINGS:     No restricted diffusion/acute infarct is identified. A remote small  infarct/insult is seen at the right postcentral gyrus, as on prior MRI.  Scattered small foci of T2/FLAIR hyperintensity are present in the  periventricular and subcortical white matter, consistent with chronic small  vessel disease. There is generalized brain volume loss. A tiny focus of  susceptibility artifact at the left frontal white matter could reflect a  prior microhemorrhage versus prominent blood vessel and is unchanged from  prior MRI. No discrete extra-axial collection is identified. The ventricles  have normal size and configuration. No substantial mass effect is evident.  There is abnormal T2 signal throughout the visualized right internal  carotid artery, different from MRI in 2022 and suggestive of substantial  flow limitation. Expected arterial flow-related signal voids are visible  elsewhere. The dural venous sinuses are patent. No abnormal brain or  meningeal enhancement is identified after contrast administration.     No marrow replacing lesions are evident in the calvarium. There is  hyperostosis frontalis interna.    Fluid is present in the right mastoid air cells.     The right sphenoid sinus is filled. Bony wall hyperostosis is consistent  with chronic disease. There is mild mucosal thickening/debris in the left  sphenoid sinus and  right posterior ethmoid.    There are bilateral lens replacements.      Impression:           1.No acute infarct, evidence of hemorrhage, extra-axial collections,  hydrocephalus, abnormal enhancement, or mass effect.  2.Abnormal T2 signal in the right internal carotid artery, different from  MRI in 2022 and concerning for substantial flow limitation. MRA or CTA is  recommended.  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  4.Background atrophy and mild white matter changes of chronic small vessel  disease.  5.Inflammatory changes in the paranasal sinuses with chronic disease in the  right maxillary sinus.  6.Nonspecific fluid in right mastoid air cells.    Herbert Seta MD, MD  06/23/2022 9:21 PM    CT Head WO Contrast [id number] Collected: 06/23/22 1931    Order Status: Completed Updated: 06/23/22 1938    Narrative:      HISTORY: headache.     COMPARISON: Head CT on 10/14/2021    TECHNIQUE: CT of the head performed without intravenous contrast. The  following dose reduction techniques were utilized: automated exposure  control and/or adjustment of the mA and/or KV according to patient size,  and the use of an iterative reconstruction technique.    CONTRAST: None.    FINDINGS:  There is no acute  hemorrhage. The ventricles and subarachnoid spaces are  proportionately enlarged reflecting volume loss.  No acute territorial  infarct is identified. Mild periventricular hypodensity is expected for  age. No mass or mass effect. Basal cisterns are patent. Intracranial  vascular calcifications are identified.    No aggressive osseous lesion. Near-complete opacified right maxillary and  left sphenoid sinuses with evidence of chronic sinusitis which is unchanged  compared to prior.    Near-complete the opacified right mastoid air cells an partially opacified  left mastoid air cells which is increased compared to prior.     There is bilateral lens replacement.        Impression:         No CT evidence of acute  intracranial hemorrhage, herniation, or  hydrocephalus.     Worsening mastoid effusions bilaterally as detailed above.    Nelya Ebadirad  06/23/2022 7:36 PM             All recent brain and spine imaging (MRI, CT) personally reviewed.    Chart reviewed    Case discussed with:   minutes; >50% time spent in counseling or coordination of care        This note was generated by the Epic EMR system/Speech recognition and may contain inherent errors or omissions not intended by the user. Grammatical errors, random word insertions, deletions and pronoun errors  are occasional consequences of this technology due to software limitations.   Not all errors are caught or corrected. If there are questions or concerns about the content of this note or information contained within the body of this dictation they should be addressed directly with the author for clarification.    Signed by: Dorothea Glassman, MD, MD  Spectralink: [phone]      Answering Service: [phone]

## 2022-06-24 NOTE — Plan of Care (Signed)
Problem: Pain interferes with ability to perform ADL  Goal: Pain at adequate level as identified by patient  Outcome: Progressing  Flowsheets (Taken 06/24/2022 0331)  Pain at adequate level as identified by patient:   Identify patient comfort function goal   Assess for risk of opioid induced respiratory depression, including snoring/sleep apnea. Alert healthcare team of risk factors identified.   Assess pain on admission, during daily assessment and/or before any "as needed" intervention(s)   Reassess pain within 30-60 minutes of any procedure/intervention, per Pain Assessment, Intervention, Reassessment (AIR) Cycle   Evaluate if patient comfort function goal is met   Evaluate patient's satisfaction with pain management progress   Offer non-pharmacological pain management interventions   Consult/collaborate with Pain Service     Problem: Safety  Goal: Patient will be free from injury during hospitalization  Outcome: Progressing  Flowsheets (Taken 06/24/2022 0331)  Patient will be free from injury during hospitalization:   Assess patient's risk for falls and implement fall prevention plan of care per policy   Provide and maintain safe environment   Use appropriate transfer methods   Ensure appropriate safety devices are available at the bedside   Include patient/ family/ care giver in decisions related to safety   Hourly rounding   Assess for patients risk for elopement and implement Elopement Risk Plan per policy   Provide alternative method of communication if needed (communication boards, writing)  Goal: Patient will be free from infection during hospitalization  Outcome: Progressing  Flowsheets (Taken 06/24/2022 0331)  Free from Infection during hospitalization:   Assess and monitor for signs and symptoms of infection   Monitor lab/diagnostic results   Monitor all insertion sites (i.e. indwelling lines, tubes, urinary catheters, and drains)   Encourage patient and family to use good hand hygiene technique

## 2022-06-24 NOTE — Progress Notes (Signed)
CM completed assessment with patient at bedside. CM introduced self/role, confirmed demographic information. Pt lives at [place], daughter assists with transportation to and from medical appointments. Pt uses [facility] and requires assist with ADLs.     DCP A: ALF - [facility], daughter will transport       06/24/22 1042   Patient Type   Within 30 Days of Previous Admission? No   Healthcare Decisions   Interviewed: Patient   Orientation/Decision Making Abilities of Patient Alert and Oriented x3, able to make decisions   Advance Directive Patient has advance directive, copy not in chart   Healthcare Agent Appointed No   Prior to admission   Prior level of function Ambulates with assistive device;Needs assistance with ADLs   Type of Residence Assisted living   Home Layout One level   Have running water, electricity, heat, etc? Yes   Living Arrangements Alone   How do you get to your MD appointments? Daughter assists   How do you get your groceries? self/daughter   Who fixes your meals? self/daughter   Who does your laundry? self/daughter   Who picks up your prescriptions? self/daughter   Dressing Needs assistance   Grooming Needs assistance   Feeding Independent   Bathing Needs assistance   Toileting Needs assistance   DME Currently at [facility] [employer]   Name of Prior [employer]   Discharge [facility];Home care staff   Patient expects to be discharged to: ALF- [facility]   Anticipated [place] plan discussed with: Same as interviewed   Mode of transportation: Private car (family member)   Does the patient have perscription coverage? Yes   Financial Resource Strain   How hard is it for you to pay for the very basics like food, housing, medical care, and heating? Not hard   Housing Stability   In the last 12 months, was there a time when you were not able to pay the mortgage or rent on time? N   In the last  12 months, was there a time when you did not have a steady place to sleep or slept in a shelter (including now)? N   Transportation Needs   In the past 12 months, has lack of transportation kept you from medical appointments or from getting medications? no   In the past 12 months, has lack of transportation kept you from meetings, work, or from getting things needed for daily living? No   Consults/Providers   Correct PCP listed in Epic? Yes   Family and PCP   PCP on file was verified as the current PCP? Yes   In case you are admitted, transferred or discharged, would like family notified? Yes   In case you are admitted, transferred or discharged, would like your PCP notified? Yes   Important Message from [facility] Notice   Patient received 1st IMM Letter? Yes

## 2022-06-24 NOTE — UM Notes (Signed)
Admit to Observation [id number]    Electronically signed by: Oliver Hum, NP on 06/23/22 2131 Status: Completed   Ordering user: Oliver Hum, NP 06/23/22 2131 Ordering provider: Oliver Hum, NP   Authorized by: Oliver Hum, NP   Cosigning events  Electronically cosigned by Maye Hides, MD 06/24/22 [id number] for Ordering     82 y.o. Kelly who presented to ED for worsening HA x 3 weeks associated with elevated BP, blurry vision/diplopia, and left ear fullness/pressure.     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 Lovenia Shuck) consulted by ED, appreciate follow-up  -Will need ENT consult in AM     #Elevated blood pressure  #Hypertension  #Hyperlipidemia  #CAD s/p CABG (2002), stents (2006)  #Bilateral carotid artery stenosis  -Per patient, her BP has been elevated intermittently for the past 3 weeks  -BP elevated on admission, improved after IV hydralazine x1, continue to monitor  -01/2020 PET myocardial perfusion:  small area of apical wall ischemia  -05/2020 carotid A dopplers: bilateral 50-69% stenosis of ICAs  -11/2020 echo: EF 65-70%, LVH, trace aortic regurgitation  -Continue valsartan (substitute for home candesartan), ranolazine, midodrine  -Continue home ASA and statin  -PRN IV hydralazine for SBP >160  -Followed by outpatient cardiologist [facility])     #RLQ abdominal pain  -Afebrile, no leukocytosis  -05/31/2022 CT A/P: large hiatal hernia, colonic diverticulosis, stable left renal cysts  -TTP of RLQ abdomen on exam  -Denies any diarrhea, hematemesis, hematochezia  -Mild N/V present during exam secondary to recent administration of contrast dye  -Continue to monitor, consider repeat CT A/P if persistent/worsening abdominal pain     #Chronic anemia  -H/H stable on admission, no signs of active bleeding on exam, monitor CBC  -Vitamin B12 and iron profile pending     #T2DM  -12/2020 A1c 5.3%  -Repeat A1c pending  -Not on any outpatient medical management currently     #CKD stage 3a  -Cr 1.1 on admission, appears to be baseline per chart review, monitor BMP  -Avoid NSAIDs/nephrotoxins, renally dose medications  -Heparin prophylaxis dose adjusted for renal function     #GERD  -Continue home pantoprazole and PRN Tums     #Chronic constipation  -Continue home MiraLAX     #Seasonal allergic rhinitis  -Continue Zyrtec (substitute for home Allegra), Flonase     #Anxiety/depression  -Continue home venlafaxine     #Osteoarthritis  #S/p right TKA  -Continue home PRN lidocaine patches  Patient has BMI=Body mass index is 29.28 kg/m.  Diagnosis: Overweight based on BMI criteria        Nutrition:Diet Consistent Carbohydrate and Heart Healthy  VTE Prophylaxis-Medication VTE Prophylaxis Orders: heparin (porcine) injection 5,000 Units  Mechanical VTE Prophylaxis Orders: Maintain sequential compression device     Code status: Full code      Status/Disposition:   Pt is admitted under OBSERVATION with above concerns.    Anticipated  medical stability for discharge: 24 Hrs       MRI brain:  1.No acute infarct, evidence of hemorrhage, extra-axial collections, hydrocephalus, abnormal enhancement, or mass effect.   2.Abnormal T2 signal in the right internal carotid artery, different from MRI in 2022 and concerning for substantial flow limitation. MRA or CTA is recommended.   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   4.Background atrophy and mild white matter changes of chronic small vessel disease.   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   6.Nonspecific fluid in right mastoid air cells.              06/23/22 18:41 06/24/22 06:20   Hemoglobin 10.3 (L) 9.5 (L)   Hematocrit 30.4 (L) 28.9 (L)   RBC 3.08 (L) 2.88 (L)   MCV 98.7 (H) 100.3 (H)   Eosinophils Absolute Automated 0.56 (H) 0.12   Glucose 103 (H) 149 (H)   Creatinine 1.1 (H) 1.0   Calcium 10.3 (H) 9.5   eGFR Vanessa.5 ! 55.5 !   Globulin 3.8 (H)    Sed Rate 40 (H)         Erskine Squibb, MSN, RN  [facility], Revenue Cycle Department, Utilization Review  Phone [phone]   Fax [phone]   [email or web address]

## 2022-06-24 NOTE — Progress Notes (Signed)
MEDICINE PROGRESS NOTE  [facility], DIVISION OF HOSPITALIST MEDICINE   [place] Pager: [phone]      Date Time: 06/24/22 7:36 PM  Patient Name: Vanessa Kelly  Attending Physician: Maye Hides, MD  Hospital Day: 2  Assessment:     82 y.o. 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 And Nonspecific fluid in right mastoid air cells and there was concern for mastoiditis and she was started on IV abx.      Of note:hx of orthostatic hypotension and takes midodrine if SBP>120,also states about 5 weeks ago she was started on a medication which she does not remember the name ,based on med dispense seems she was given 0.1 florinef on 05/14/2022 which could be the reason for headache and HTN.    Plan:   Headache new since 3-4 weeks ago,could be med effect (florinef and also due to accelerated HTN,however, could be due to muscle spasm/neck pain,or sinusitis  H/o orthostatic hypotension on Midodrine and Florinef   -discussed with neurologist,input appreciated  -based on MRI,will get MRA head and neck and due to visual changes, MRV is ordered to r/o venous thrombosis  -will give mg 1 g IV x1 dose,received a dose yesterday as well  -start her on Robaxin 500 mg BID as well  -will hold florinef and midodrine for now and see how  BP does,check orthostatic   -PT/OT  -fall precaution      Left ear pain and reduced hearing ,mastoiditis is ruled out based on exam and MRI finding  Sphenoid and maxillary sinusitis noted on MRI brain  Right ear Cerumen   -ENT consulted and input appreciated  -afebrile,no leucocytosis, no pain at right mastoid, but pain on left side of neck,  -stop IV abx  -needs audiogram in office after discharge per ENT and cerumen debridement in office      Essential Hypertension with accelerated HTN on admission 236/96  H/o orthostatic hypotension  H/o Hyperlipidemia  CAD s/p CABG (2002), stents (2006)  Bilateral carotid artery stenosis  -Per patient, her BP has been elevated intermittently for the past 3-4 weeks  -BP elevated on admission, improved after IV hydralazine x1, continue to monitor  -01/2020 PET myocardial perfusion: small area of apical wall ischemia  -05/2020 carotid A dopplers: bilateral 50-69% stenosis of ICAs  -11/2020 echo: EF 65-70%, LVH, trace aortic regurgitation  -Continue valsartan (substitute for home candesartan), ranolazine, and hold midodrine 5 mg TID if SBP<120 and also noted on florinef 0.1 mg daily since 05/14/22,hold that too which could be causing headache/increased in BP  -Continue home ASA and statin  -Followed by outpatient cardiologist [facility])       RLQ abdominal pain on admission,denied pain for me,but mentions Right groin pain/RLE pain which is chronic  -Afebrile, no leukocytosis  -05/31/2022 CT A/P: large hiatal hernia, colonic diverticulosis, stable  left renal cysts  -Denies any diarrhea, hematemesis, hematochezia  -Mild N/V present secondary to recent administration of contrast dye,resolved  -Continue to monitor, consider repeat CT A/P if persistent/worsening abdominal pain       Chronic macrocytic anemia  -H/H stable on admission, no signs of active bleeding on exam, monitor CBC  -Vitamin B12 673 and iron 43 and iron sat 21       T2DM,diet controlled   -12/2020 A1C 5.4%  -Not on  any outpatient medical management currently       CKD stage 3A  -Cr 1.1 on admission, appears to be baseline per chart review, monitor BMP  -Avoid NSAIDs/nephrotoxins, renally dose medications  -Heparin prophylaxis dose adjusted for renal function   -cr 1.0 now     GERD  -Continue home pantoprazole and PRN Tums     Chronic constipation  -Continue home MiraLAX     Seasonal allergic rhinitis  -Continue Zyrtec (substitute for home Allegra), Flonase     Anxiety/depression  -Continue home venlafaxine     Osteoarthritis  S/p right TKA  -Continue home PRN lidocaine patches           Patient has BMI=Body mass index is 29.28 kg/m.  Diagnosis: Overweight based on BMI criteria           DVT [id number] sc         I spent 50 minutes on the unit in direct care for this patient  > 50% in counseling and coordination of care for conditions described in my assessment and plan.       Case was discussed with pt and RN and neurologist Dr.Khosla and pt's daughter [name] on the phone       Lines:     Patient Lines/Drains/Airways Status       Active [place] / CVC Line / PIV Line / Drain / Airway / Intraosseous Line / Epidural Line / ART Line / Line / Wound / Pressure Ulcer / NG/OG Tube       Name Placement date Placement time Site Days    Peripheral IV 06/23/22 20 G Standard Anterior;Distal;Left Upper Arm 06/23/22  1945  Upper Arm  less than 1    External Urinary Catheter 01/22/21  0030  --  518                       Subjective/ROS/24 hr events:     [id number], elevated BP,diplopia and blurry vision and hearing loss/left ear pain    Pt seen and examined at bedside in presence of RN and also neurologist Dr.Khosla  She says her headache has been going on for 3-4 weeks right sided forehead with associated blurry vision/diplopia and also left ear pain  and some hearing loss,Afebrile, no N/V,no CP or SOB,no recent URI symptoms, pain is throbbing and intermittent,has never had this before, past 2-3 days BP was also elevated and she got  scared and came in, she has RLE weakness on and off and not new,    Physical Exam:     Temp:  [97.3 F (36.3 C)-98.2 F (36.8 C)] 97.5 F (36.4 C)  Heart Rate:  [52-85] 77  Resp Rate:  [14-20] 16  BP: (113-236)/(54-96) 113/54  Intake and Output Summary-last 24 Hrs:  I/O last 3 completed shifts:  In: 100 [IV Piggyback:100]  Out: -     No data recorded by O2 Device: None (Room air)     General: AAOX3, in no acute distress,on  RA  Cardiovascular: regular rate and rhythm, no murmurs  Lungs: clear to auscultation bilaterally, no additional sounds  Abdomen: soft, non-tender, non-distended; normoactive bowel sounds  Extremities: no edema  Neurological: Alert and oriented X 3, moves all extremities.   Skin:no rash or lesion  Foley: none  Central line:none       Meds:   Medications were reviewed:  Scheduled Meds:  Current Facility-Administered Medications   Medication Dose Route Frequency    aspirin EC  81 mg Oral Daily    atorvastatin  10 mg Oral Daily    cetirizine  10 mg Oral Daily    fluticasone  2 spray Each Nare Daily    [START ON 06/25/2022] heparin (porcine)  5,000 Units Subcutaneous Q12H [place]    magnesium sulfate  1 g Intravenous Once    methocarbamol  500 mg Oral BID    pantoprazole  40 mg Oral QAM AC    polyethylene glycol  17 g Oral Daily    ranolazine  500 mg Oral BID    valsartan  40 mg Oral Daily    venlafaxine  75 mg Oral QHS    [START ON 06/25/2022] vitamin D (ergocalciferol)  50,000 Units Oral Weekly    vitamins/minerals  1 tablet Oral Daily     Continuous Infusions:  PRN Meds:.acetaminophen, albuterol sulfate HFA, benzocaine-menthol, benzonatate, calcium carbonate, carboxymethylcellulose sodium, dextrose **OR** dextrose **OR** dextrose **OR** glucagon (rDNA), lidocaine, magnesium sulfate, melatonin, naloxone, ondansetron **OR** ondansetron, potassium & sodium phosphates, potassium chloride **AND** potassium chloride, saline  Labs/Radiology:   Imaging personally reviewed, including: all available   MRI Brain  with/without Contrast    Result Date: 06/23/2022   1.No acute infarct, evidence of hemorrhage, extra-axial collections, hydrocephalus, abnormal enhancement, or mass effect. 2.Abnormal T2 signal in the right internal carotid artery, different from MRI in 2022 and concerning for substantial flow limitation. MRA or CTA is recommended. 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 4.Background atrophy and mild white matter changes of chronic small vessel disease. 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 6.Nonspecific fluid in right mastoid air cells. Herbert Seta MD, MD 06/23/2022 9:21 PM   No results for input(s): "GLUCOSEWB" in the last 24 hours.  Recent Labs   Lab 06/24/22  0620 06/23/22  1841   Sodium 139 140   Potassium 4.6 4.5   Chloride 107 107   BUN 17.0 18.0   Creatinine 1.0 1.1*   eGFR 55.5* 49.5*   Glucose 149* 103*   Calcium 9.5 10.3*     Recent Labs   Lab 06/24/22  0620 06/23/22  1841   WBC 6.98 6.20   Hgb 9.5* 10.3*   Hematocrit 28.9* 30.4*   Platelets 177 185         Recent Labs   Lab 06/23/22  1841   Alkaline Phosphatase 85   Bilirubin, Total 0.3   ALT 20   AST (SGOT) 24     Recent Labs   Lab 06/23/22  1841   C-Reactive Protein 0.6   Procalcitonin 0.04         Signed by: Maye Hides, MD, FACP

## 2022-06-25 ENCOUNTER — Observation Stay: Payer: Medicare Other

## 2022-06-25 LAB — CBC AND DIFFERENTIAL
Absolute NRBC: 0 10*3/uL (ref 0.00–0.00)
Basophils Absolute Automated: 0.05 10*3/uL (ref 0.00–0.08)
Basophils Automated: 0.9 %
Eosinophils Absolute Automated: 0.52 10*3/uL — ABNORMAL HIGH (ref 0.00–0.44)
Eosinophils Automated: 8.9 %
Hematocrit: 35.8 % (ref 34.7–43.7)
Hgb: 11.8 g/dL (ref 11.4–14.8)
Immature Granulocytes Absolute: 0.02 10*3/uL (ref 0.00–0.07)
Immature Granulocytes: 0.3 %
Instrument Absolute Neutrophil Count: 3.41 10*3/uL (ref 1.10–6.33)
Lymphocytes Absolute Automated: 1.39 10*3/uL (ref 0.42–3.22)
Lymphocytes Automated: 23.7 %
MCH: 33.4 pg (ref 25.1–33.5)
MCHC: 33 g/dL (ref 31.5–35.8)
MCV: 101.4 fL — ABNORMAL HIGH (ref 78.0–96.0)
MPV: 10.4 fL (ref 8.9–12.5)
Monocytes Absolute Automated: 0.48 10*3/uL (ref 0.21–0.85)
Monocytes: 8.2 %
Neutrophils Absolute: 3.41 10*3/uL (ref 1.10–6.33)
Neutrophils: 58 %
Nucleated RBC: 0 /100 WBC (ref 0.0–0.0)
Platelets: 168 10*3/uL (ref 142–346)
RBC: 3.53 10*6/uL — ABNORMAL LOW (ref 3.90–5.10)
RDW: 13 % (ref 11–15)
WBC: 5.87 10*3/uL (ref 3.10–9.50)

## 2022-06-25 LAB — BASIC METABOLIC PANEL
Anion Gap: 9 (ref 5.0–15.0)
BUN: 17 mg/dL (ref 7.0–21.0)
CO2: 23 mEq/L (ref 17–29)
Calcium: 10 mg/dL (ref 7.9–10.2)
Chloride: 109 mEq/L (ref 99–111)
Creatinine: 1.1 mg/dL — ABNORMAL HIGH (ref 0.4–1.0)
Glucose: 116 mg/dL — ABNORMAL HIGH (ref 70–100)
Potassium: 4.6 mEq/L (ref 3.5–5.3)
Sodium: 141 mEq/L (ref 135–145)
eGFR: 49.5 mL/min/{1.73_m2} — AB (ref >=60)

## 2022-06-25 MED ORDER — HYDRALAZINE HCL 25 MG PO TABS
25.0000 mg | ORAL_TABLET | Freq: Once | ORAL | Status: AC
Start: 2022-06-25 — End: 2022-06-25
  Administered 2022-06-25: 25 mg via ORAL
  Filled 2022-06-25: qty 1

## 2022-06-25 MED ORDER — GADOTERATE MEGLUMINE 10 MMOL/20ML IV SOLN (CLARISCAN)
20.0000 mL | Freq: Once | INTRAVENOUS | Status: AC | PRN
Start: 2022-06-25 — End: 2022-06-25
  Administered 2022-06-25: 20 mL via INTRAVENOUS

## 2022-06-25 NOTE — Progress Notes (Signed)
Date Time: 06/25/22 12:54 PM  Patient Name: Vanessa Kelly  Attending Physician: Maye Hides, MD  Patient Class: Observation  Hospital Day: 0            NEUROLOGY PROGRESS NOTE             Assessment/Plan   Headaches, with some migraine features, ongoing for the last 3 to 4 days, fluctuating, in intensity, and intermittent headaches, not sure about etiology, could be due to occipital neuralgia muscular spasm resulting in pain, could be due to hypertensive emergency given widely very wide variation in blood pressure readings Seems more likely that these are related to blood pressure changes and fluctuations  Recent Labs   Lab 06/24/22  0620   Hemoglobin A1C 5.4     Recent Labs   Lab 06/24/22  0620   Cholesterol 163   Triglycerides 39   HDL 74   LDL Calculated 81     Recent Labs   Lab 06/24/22  0620   LDL Calculated 81        Continue magnesium Robaxin on empiric basis for now  Wait on vascular imaging studies  Agree with stopping Florinef and watching patient how she does clinically  Will follow-up      Subjective:   Patient Seen and Examined. States she had a headache last night blood pressure was in the 200 range    Review of Systems:   Noeye, ear nose, throat problems; no coughing or wheezing or shortness of breath,   No chest pain or orthopnea, no abdominal pain, nausea or vomiting,   No pain in the body or extremities, no psychiatric, neurological, endocrine, hematological   or cardiac complaints except as noted above.        Physical Exam:   BP 194/46   Pulse 66   Temp 97.9 F (36.6 C) (Oral)   Resp 17   Ht 1.702 m ('5\' 7"'$ )   Wt 84.8 kg (186 lb 15.2 oz)   SpO2 99%   BMI 29.28 kg/m     Neuro:  Level of consciousness:  Lying in bed awake and alert mild facial asymmetry moving both arms and legs well mild discomfort on palpation of the paracervical region on the left  Meds:      Scheduled Meds: PRN Meds:    aspirin EC, 81 mg, Oral, Daily  atorvastatin, 10 mg, Oral, Daily  cetirizine, 10 mg, Oral,  Daily  fluticasone, 2 spray, Each Nare, Daily  heparin (porcine), 5,000 Units, Subcutaneous, Q12H [place]  methocarbamol, 500 mg, Oral, BID  pantoprazole, 40 mg, Oral, QAM AC  polyethylene glycol, 17 g, Oral, Daily  ranolazine, 500 mg, Oral, BID  valsartan, 40 mg, Oral, Daily  venlafaxine, 75 mg, Oral, QHS  vitamin D (ergocalciferol), 50,000 Units, Oral, Weekly  vitamins/minerals, 1 tablet, Oral, Daily        Continuous Infusions:   acetaminophen, 650 mg, Q4H PRN  albuterol sulfate HFA, 1 puff, Q4H PRN  benzocaine-menthol, 1 lozenge, Q2H PRN  benzonatate, 100 mg, TID PRN  calcium carbonate, 1 tablet, Q6H PRN  carboxymethylcellulose sodium, 1 drop, TID PRN  dextrose, 15 g of glucose, PRN   Or  dextrose, 12.5 g, PRN   Or  dextrose, 12.5 g, PRN   Or  glucagon (rDNA), 1 mg, PRN  lidocaine, 1 patch, Q24H PRN  magnesium sulfate, 1 g, PRN  melatonin, 3 mg, QHS PRN  naloxone, 0.2 mg, PRN  ondansetron, 4 mg, Q6H PRN   Or  ondansetron,  4 mg, Q6H PRN  potassium & sodium phosphates, 2 packet, PRN  potassium chloride, 0-40 mEq, PRN   And  potassium chloride, 10 mEq, PRN  saline, 2 spray, Q4H PRN                Labs:     Recent Labs   Lab 06/25/22  0636 06/24/22  0620 06/23/22  1841   Glucose 116* 149* 103*   BUN 17.0 17.0 18.0   Creatinine 1.1* 1.0 1.1*   Calcium 10.0 9.5 10.3*   Sodium 141 139 140   Potassium 4.6 4.6 4.5   Chloride 109 107 107   CO2 '23 23 23   '$ Albumin  --   --  3.7   AST (SGOT)  --   --  24   ALT  --   --  20   Bilirubin, Total  --   --  0.3   Alkaline Phosphatase  --   --  85     Recent Labs   Lab 06/25/22  0636 06/24/22  0620 06/23/22  1841   WBC 5.87 6.98 6.20   Hgb 11.8 9.5* 10.3*   Hematocrit 35.8 28.9* 30.4*   MCV 101.4* 100.3* 98.7*   MCH 33.4 33.0 33.4   MCHC 33.0 32.9 33.9   Platelets 168 177 185                 Lab Results   Component Value Date    TSH 1.49 06/24/2022     Recent Labs   Lab 06/23/22  1841   C-Reactive Protein 0.6      Recent Labs   Lab 06/23/22  1841   Sed Rate 40*      .ll    No results  for input(s): "PTT", "PT", "INR" in the last 72 hours.       Radiology Results (24 Hour)       ** No results found for the last 24 hours. **             All brain imaging (MRI, CT) personally reviewed.    Case discussed with:               I personally reviewed all of the medications.  Medication list generated using all available resources.  Elder abuse (physical)  - negative  Advanced care plan - reviewed from chart or in discussion with pt or family        This note was generated by the Epic EMR system/ Dragon speech recognition and may contain inherent errors or omissions not intended by the user. Grammatical errors, random word insertions, deletions and pronoun errors  are occasional consequences of this technology due to software limitations.   Not all errors are caught or corrected. If there are questions or concerns about the content of this note or information contained within the body of this dictation they should be addressed directly with the author for clarification.      Signed by: Dorothea Glassman, MD, MD  [id number]      Answering Service: [phone]

## 2022-06-25 NOTE — PT Progress Note (Signed)
Physical Therapy Note    [facility]  Physical Therapy Attempt Note    Patient:  Vanessa Kelly MRN#:  [id number]    Unit:  [facility] INTERMEDIATE CARE Room/Bed:  MI629/MI629-02    PT Cancellation: Order     PT Order Cancellation Reason: Provider/Medical Hold (comment required) - Provider/RN decision.  Awaiting results of MRV and MRA completed today but no final results available.  Patient continues to c/o significant headache.         Signature:   Gearldine Bienenstock, PT  06/25/2022  3:28 PM       (For scheduling questions, please contact rehab tech x [phone])

## 2022-06-25 NOTE — Progress Notes (Signed)
06/25/22 0835 06/25/22 0839 06/25/22 0842   Vital Signs   Heart Rate 65 (!) 56 80   BP 193/71 156/68 107/66   BP Location Left arm  --   --    MAP (mmHg) (!) 112 97 80   Patient Position Lying Sitting Standing     Pt was not symptomatic standing

## 2022-06-25 NOTE — Progress Notes (Incomplete)
MEDICINE PROGRESS NOTE  [facility], DIVISION OF HOSPITALIST MEDICINE   [place] Pager: [phone]      Date Time: 06/26/22 1:51 AM  Patient Name: Vanessa Kelly  Attending Physician: Maye Hides, MD  Hospital Day: 4  Assessment:     82 y.o. 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 And Nonspecific fluid in right mastoid air cells and there was concern for mastoiditis and she was started on IV abx.      Of note:hx of orthostatic hypotension and takes midodrine if SBP>120,also states about 5 weeks ago she was started on a medication which she does not remember the name ,based on med dispense seems she was given 0.1 florinef on 05/14/2022 which could be the reason for headache and HTN.    Plan:   Headache new since 3-4 weeks ago,could be med effect (florinef and also due to accelerated HTN,however, could be due to muscle spasm/neck pain,or sinusitis)  H/o orthostatic hypotension on Midodrine and Florinef   -discussed with neurologist,input appreciated  -based on MRI,ordered MRA head and neck and due to visual changes, MRV is ordered to r/o venous thrombosis  -will give mg 1 g IV xdaily ,received a dose yesterday as well  -c/w Robaxin 500 mg BID as well  -will hold florinef and midodrine for now and see how BP  does,  checked orthostatic       06/25/22 0835 06/25/22 0839 06/25/22 0842   Vital Signs   Heart Rate 65 (!) 56 80   BP 193/71 156/68 107/66   BP Location Left arm  --   --    MAP (mmHg) (!) 112 97 80   Patient Position Lying Sitting Standing    -Pt was not symptomatic standing   -PT/OT  -fall precaution      Left ear pain and reduced hearing ,mastoiditis is ruled out based on exam and MRI finding  Sphenoid and maxillary sinusitis noted on MRI brain  Right ear Cerumen   -ENT consulted and input appreciated  -afebrile,no leucocytosis, no pain at right mastoid, but pain on left side of neck,  -stopped IV abx which had ordered on admission on 2/23  -needs audiogram in office after discharge per ENT and cerumen debridement in office      Essential Hypertension with accelerated HTN on admission 236/96  H/o orthostatic hypotension  H/o Hyperlipidemia  CAD s/p CABG (2002), stents (2006)  Bilateral carotid artery stenosis  -Per patient, her BP has been elevated intermittently for the past 3-4 weeks  -BP elevated on admission, improved after IV hydralazine x1, continue to monitor  -01/2020 PET myocardial perfusion: small area of apical wall ischemia  -05/2020 carotid A dopplers: bilateral 50-69% stenosis of ICAs  -11/2020 echo: EF 65-70%, LVH, trace aortic regurgitation  -Continue valsartan (substitute for home candesartan), ranolazine, and hold midodrine 5 mg  TID if SBP<120 and also noted on florinef 0.1 mg daily since 05/14/22,hold that too which could be causing headache/increased in BP,instead start compression stocking and abd binder and repeat orthostatic with those  -Continue home ASA and statin  -Followed by outpatient cardiologist [facility])       RLQ abdominal pain on admission,denied pain for me,but mentions Right groin pain/RLE pain which is chronic  -Afebrile, no leukocytosis  -05/31/2022 CT A/P: large hiatal hernia, colonic diverticulosis, stable left renal cysts  -Denies any diarrhea, hematemesis,  hematochezia  -Mild N/V present secondary to recent administration of contrast dye,resolved  -Continue to monitor, consider repeat CT A/P if persistent/worsening abdominal pain       Chronic macrocytic anemia  -H/H stable on admission, no signs of active bleeding on exam, monitor CBC  -Vitamin B12 673 and iron 43 and iron sat 21       T2DM,diet controlled   -12/2020 A1C 5.4%  -Not on any outpatient medical management currently       CKD stage 3A  -Cr 1.1, appears to be baseline per chart review, monitor BMP  -Avoid NSAIDs/nephrotoxins, renally dose medications  -Heparin prophylaxis dose adjusted for renal function   -cr 1.1 now     GERD  -Continue home pantoprazole and PRN Tums     Chronic constipation  -Continue home MiraLAX     Seasonal allergic rhinitis  -Continue Zyrtec (substitute for home Allegra), Flonase     Anxiety/depression  -Continue home venlafaxine     Osteoarthritis  S/p right TKA  -Continue home PRN lidocaine patches           Patient has BMI=Body mass index is 29.28 kg/m.  Diagnosis: Overweight based on BMI criteria           DVT [id number] sc         I spent 45 minutes on the unit in direct care for this patient  > 50% in counseling and coordination of care for conditions described in my assessment and plan.       Case was discussed with pt and RN and neurologist Dr.Khosla and asked RN to ask pt's ALF to fax the med list to see exactly what she was taking       Lines:     Patient Lines/Drains/Airways Status       Active PICC Line / CVC Line / PIV Line / Drain / Airway / Intraosseous Line / Epidural Line / ART Line / Line / Wound / Pressure Ulcer / NG/OG Tube       Name Placement date Placement time Site Days    Peripheral IV 06/23/22 20 G Standard Anterior;Distal;Left Upper Arm 06/23/22  1945  Upper Arm  less than 1    External Urinary Catheter 01/22/21  0030  --  518                       Subjective/ROS/24 hr events:     [id number], elevated BP,diplopia and blurry vision and hearing loss/left  ear pain    Pt seen and examined at bedside in presence of RN   She says last night she woke up with headache and her BP was 205/71,now better   Afebrile,no N/V,no CP or SOB,urinating ok  Physical Exam:     Temp:  [97.3 F (36.3 C)-98.1 F (36.7 C)] 97.7 F (36.5 C)  Heart Rate:  [56-80] 72  Resp Rate:  [16-18] 18  BP: (107-219)/(46-111) 124/71  Intake and  Output Summary-last 24 Hrs:  I/O last 3 completed shifts:  In: -   Out: 2950 [Urine:2950]    No data recorded by O2 Device: None (Room air)     General: AAOX3, in no acute distress,on RA  Cardiovascular: regular rate and rhythm, + murmur  Lungs: clear to auscultation bilaterally, no additional sounds  Abdomen: soft, non-tender, non-distended; normoactive bowel sounds  Extremities: no edema  Neurological: Alert and oriented X 3, moves all extremities.   Skin:no rash or lesion  Foley: none  Central line:none       Meds:   Medications were reviewed:  Scheduled Meds:  Current Facility-Administered Medications   Medication Dose Route Frequency    aspirin EC  81 mg Oral Daily    atorvastatin  10 mg Oral Daily    cetirizine  10 mg Oral Daily    fluticasone  2 spray Each Nare Daily    heparin (porcine)  5,000 Units Subcutaneous Q12H [place]    magnesium sulfate  1 g Intravenous Daily    methocarbamol  500 mg Oral BID    pantoprazole  40 mg Oral QAM AC    polyethylene glycol  17 g Oral Daily    ranolazine  500 mg Oral BID    valsartan  40 mg Oral Daily    venlafaxine  75 mg Oral QHS    vitamin D (ergocalciferol)  50,000 Units Oral Weekly    vitamins/minerals  1 tablet Oral Daily     Continuous Infusions:  PRN Meds:.acetaminophen, albuterol sulfate HFA, benzocaine-menthol, benzonatate, calcium carbonate, carboxymethylcellulose sodium, dextrose **OR** dextrose **OR** dextrose **OR** glucagon (rDNA), lidocaine, magnesium sulfate, melatonin, naloxone, ondansetron **OR** ondansetron, saline  Labs/Radiology:   Imaging personally reviewed, including: all available     No results  for input(s): "GLUCOSEWB" in the last 24 hours.  Recent Labs   Lab 06/25/22  0636 06/24/22  0620 06/23/22  1841   Sodium 141 139 140   Potassium 4.6 4.6 4.5   Chloride 109 107 107   BUN 17.0 17.0 18.0   Creatinine 1.1* 1.0 1.1*   eGFR 49.5* 55.5* 49.5*   Glucose 116* 149* 103*   Calcium 10.0 9.5 10.3*     Recent Labs   Lab 06/25/22  0636 06/24/22  0620 06/23/22  1841   WBC 5.87 6.98 6.20   Hgb 11.8 9.5* 10.3*   Hematocrit 35.8 28.9* 30.4*   Platelets 168 177 185         Recent Labs   Lab 06/23/22  1841   Alkaline Phosphatase 85   Bilirubin, Total 0.3   ALT 20   AST (SGOT) 24     Recent Labs   Lab 06/23/22  1841   C-Reactive Protein 0.6   Procalcitonin 0.04         Signed by: Maye Hides, MD, FACP

## 2022-06-25 NOTE — Plan of Care (Signed)
Problem: Pain interferes with ability to perform ADL  Goal: Pain at adequate level as identified by patient  Outcome: Progressing  Flowsheets (Taken 06/25/2022 0513)  Pain at adequate level as identified by patient:   Identify patient comfort function goal   Assess for risk of opioid induced respiratory depression, including snoring/sleep apnea. Alert healthcare team of risk factors identified.   Assess pain on admission, during daily assessment and/or before any "as needed" intervention(s)   Reassess pain within 30-60 minutes of any procedure/intervention, per Pain Assessment, Intervention, Reassessment (AIR) Cycle   Evaluate if patient comfort function goal is met   Evaluate patient's satisfaction with pain management progress     Problem: Side Effects from Pain Analgesia  Goal: Patient will experience minimal side effects of analgesic therapy  Outcome: Progressing  Flowsheets (Taken 06/25/2022 0513)  Patient will experience minimal side effects of analgesic therapy:   Monitor/assess patient's respiratory status (RR depth, effort, breath sounds)   Assess for changes in cognitive function   Prevent/manage side effects per LIP orders (i.e. nausea, vomiting, pruritus, constipation, urinary retention, etc.)   Evaluate for opioid-induced sedation with appropriate assessment tool (i.e. POSS)     Problem: Moderate/High Fall Risk Score >5  Goal: Patient will remain free of falls  Outcome: Progressing  Flowsheets (Taken 06/24/2022 1950)  Moderate Risk (6-13):   MOD-Apply bed exit alarm if patient is confused   MOD-Remain with patient during toileting   MOD-Re-orient confused patients   MOD-Perform dangle, stand, walk (DSW) prior to mobilization

## 2022-06-25 NOTE — Nursing Progress Note (Signed)
Pt is Aox4, sating 99% on RA, BP elevated 190/70, continues complaining of headache 6/10, provider notified and hydralazine  25 mg and tylenol 650 mg administered. Pt denies CP/SOB. Will continues monitoring the pt.

## 2022-06-25 NOTE — Progress Notes (Signed)
Second request for medication list from [place]

## 2022-06-26 DIAGNOSIS — I159 Secondary hypertension, unspecified: Secondary | ICD-10-CM

## 2022-06-26 LAB — CBC AND DIFFERENTIAL
Absolute NRBC: 0 10*3/uL (ref 0.00–0.00)
Basophils Absolute Automated: 0.03 10*3/uL (ref 0.00–0.08)
Basophils Automated: 0.5 %
Eosinophils Absolute Automated: 0.62 10*3/uL — ABNORMAL HIGH (ref 0.00–0.44)
Eosinophils Automated: 10.4 %
Hematocrit: 32.1 % — ABNORMAL LOW (ref 34.7–43.7)
Hgb: 10.6 g/dL — ABNORMAL LOW (ref 11.4–14.8)
Immature Granulocytes Absolute: 0.01 10*3/uL (ref 0.00–0.07)
Immature Granulocytes: 0.2 %
Instrument Absolute Neutrophil Count: 3.23 10*3/uL (ref 1.10–6.33)
Lymphocytes Absolute Automated: 1.62 10*3/uL (ref 0.42–3.22)
Lymphocytes Automated: 27 %
MCH: 33.2 pg (ref 25.1–33.5)
MCHC: 33 g/dL (ref 31.5–35.8)
MCV: 100.6 fL — ABNORMAL HIGH (ref 78.0–96.0)
MPV: 10.9 fL (ref 8.9–12.5)
Monocytes Absolute Automated: 0.48 10*3/uL (ref 0.21–0.85)
Monocytes: 8 %
Neutrophils Absolute: 3.23 10*3/uL (ref 1.10–6.33)
Neutrophils: 53.9 %
Nucleated RBC: 0 /100 WBC (ref 0.0–0.0)
Platelets: 171 10*3/uL (ref 142–346)
RBC: 3.19 10*6/uL — ABNORMAL LOW (ref 3.90–5.10)
RDW: 13 % (ref 11–15)
WBC: 5.99 10*3/uL (ref 3.10–9.50)

## 2022-06-26 LAB — URINALYSIS WITH REFLEX TO MICROSCOPIC EXAM IF INDICATED
Bilirubin, UA: NEGATIVE
Blood, UA: NEGATIVE
Glucose, UA: NEGATIVE
Ketones UA: NEGATIVE
Leukocyte Esterase, UA: NEGATIVE
Nitrite, UA: NEGATIVE
Protein, UR: NEGATIVE
Specific Gravity UA: 1.008 (ref 1.001–1.035)
Urine pH: 6.5 (ref 5.0–8.0)
Urobilinogen, UA: NORMAL mg/dL (ref 0.2–2.0)

## 2022-06-26 LAB — BASIC METABOLIC PANEL
Anion Gap: 7 (ref 5.0–15.0)
BUN: 15 mg/dL (ref 7.0–21.0)
CO2: 25 mEq/L (ref 17–29)
Calcium: 9.3 mg/dL (ref 7.9–10.2)
Chloride: 108 mEq/L (ref 99–111)
Creatinine: 1.1 mg/dL — ABNORMAL HIGH (ref 0.4–1.0)
Glucose: 110 mg/dL — ABNORMAL HIGH (ref 70–100)
Potassium: 4.5 mEq/L (ref 3.5–5.3)
Sodium: 140 mEq/L (ref 135–145)
eGFR: 49.5 mL/min/{1.73_m2} — AB (ref >=60)

## 2022-06-26 LAB — VANCOMYCIN, RANDOM: Vancomycin Random: 4.5 ug/mL

## 2022-06-26 LAB — ECG 12-LEAD
Atrial Rate: 69 {beats}/min
P Axis: 49 degrees
Q-T Interval: 384 ms

## 2022-06-26 MED ORDER — ACETAMINOPHEN 500 MG PO TABS
1000.0000 mg | ORAL_TABLET | Freq: Three times a day (TID) | ORAL | Status: DC
Start: 2022-06-26 — End: 2022-06-29
  Administered 2022-06-26 – 2022-06-29 (×7): 1000 mg via ORAL
  Filled 2022-06-26 (×10): qty 2

## 2022-06-26 MED ORDER — VALSARTAN 80 MG PO TABS
80.0000 mg | ORAL_TABLET | Freq: Every day | ORAL | Status: DC
Start: 2022-06-27 — End: 2022-06-29
  Administered 2022-06-27 – 2022-06-29 (×3): 80 mg via ORAL
  Filled 2022-06-26 (×3): qty 1

## 2022-06-26 MED ORDER — HYDRALAZINE HCL 20 MG/ML IJ SOLN
10.0000 mg | Freq: Four times a day (QID) | INTRAMUSCULAR | Status: DC | PRN
Start: 2022-06-26 — End: 2022-06-29
  Administered 2022-06-26 – 2022-06-27 (×2): 10 mg via INTRAVENOUS
  Filled 2022-06-26 (×2): qty 1

## 2022-06-26 MED ORDER — MAGNESIUM SULFATE IN D5W 1-5 GM/100ML-% IV SOLN
1.0000 g | Freq: Every day | INTRAVENOUS | Status: DC
Start: 2022-06-26 — End: 2022-06-28
  Administered 2022-06-26 – 2022-06-27 (×2): 1 g via INTRAVENOUS
  Filled 2022-06-26 (×3): qty 100

## 2022-06-26 MED ORDER — MAGNESIUM SULFATE IN D5W 1-5 GM/100ML-% IV SOLN
1.0000 g | INTRAVENOUS | Status: DC
Start: 2022-06-26 — End: 2022-06-26

## 2022-06-26 NOTE — Plan of Care (Signed)
Problem: Pain interferes with ability to perform ADL  Goal: Pain at adequate level as identified by patient  Outcome: Progressing  Flowsheets (Taken 06/25/2022 0513)  Pain at adequate level as identified by patient:   Identify patient comfort function goal   Assess for risk of opioid induced respiratory depression, including snoring/sleep apnea. Alert healthcare team of risk factors identified.   Assess pain on admission, during daily assessment and/or before any "as needed" intervention(s)   Reassess pain within 30-60 minutes of any procedure/intervention, per Pain Assessment, Intervention, Reassessment (AIR) Cycle   Evaluate if patient comfort function goal is met   Evaluate patient's satisfaction with pain management progress     Problem: Safety  Goal: Patient will be free from injury during hospitalization  Outcome: Progressing  Flowsheets (Taken 06/24/2022 0331 by Welton Flakes, RN)  Patient will be free from injury during hospitalization:   Assess patient's risk for falls and implement fall prevention plan of care per policy   Provide and maintain safe environment   Use appropriate transfer methods   Ensure appropriate safety devices are available at the bedside   Include patient/ family/ care giver in decisions related to safety   Hourly rounding   Assess for patients risk for elopement and implement [place] per policy   Provide alternative method of communication if needed (communication boards, writing)

## 2022-06-26 NOTE — Progress Notes (Signed)
MEDICINE PROGRESS NOTE  [facility], DIVISION OF HOSPITALIST MEDICINE   [place] Pager: [phone]      Date Time: 06/26/22 12:09 PM  Patient Name: Vanessa Kelly  Attending Physician: Vic Ripper, MD  Hospital Day: 4  Assessment and Plan :     Active Hospital Problems    Diagnosis    Mastoiditis    Headache     82 y.o. 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 And Nonspecific fluid in right mastoid air cells and there was concern for mastoiditis and she was started on IV abx.        Of note:hx of orthostatic hypotension and takes midodrine if SBP>120,also states about 5 weeks ago she was started on 0.1 florinef on 05/14/2022      Headache new since 3-4 weeks ago,could be med effect (florinef and also due to accelerated HTN  H/o orthostatic hypotension on Midodrine and Florinef   -discussed with neurologist,input appreciated  -MRI with no new infarce, remote infarct noted, and sinusitis noted   -MRA head and neck negative  -MRV negative for sinus venous thrombosis   -patient did not tolerate  Robaxin 500 mg BID, will [place]   -will hold florinef and midodrine, patient still significantly hypertensive   -PT/OT  -fall precaution        Left ear pain and reduced hearing ,mastoiditis is ruled out based on exam and MRI  finding  Sphenoid and maxillary sinusitis noted on MRI brain  Right ear Cerumen   -ENT consulted and input appreciated  -afebrile,no leucocytosis, no pain at right mastoid, but pain on left side of neck,  -stopped IV abx which had ordered on admission on 2/23  -needs audiogram in office after discharge per ENT and cerumen debridement in office       Essential Hypertension with accelerated HTN on admission 236/96  H/o orthostatic hypotension  H/o Hyperlipidemia  CAD s/p CABG (2002), stents (2006)  Bilateral carotid artery stenosis  -BP still uncontrolled with ongoing headaches   -Per patient, her BP has been elevated intermittently for the past 3-4 weeks  -BP elevated on admission, improved after IV hydralazine x1, continue to monitor  -01/2020 PET myocardial perfusion: small area of apical wall ischemia  -05/2020 carotid A dopplers: bilateral 50-69% stenosis of ICAs  -11/2020 echo: EF 65-70%, LVH, trace aortic regurgitation  -increase valsartan from 40 mg to 80 mg daily   -check UA given new sudden rise in BP, to ensure no secondary causes including nephrotic/nephritic syndrome   -monitor BMP  -Continue home ASA and statin  -Followed by outpatient cardiologist [facility])     Anemia:   -Hgb dropped to10.6   -monitor CBC  -continue PPI      RLQ abdominal pain on admission,denied pain for me,but mentions Right groin pain/RLE pain which is chronic  -Afebrile,  no leukocytosis  -05/31/2022 CT A/P: large hiatal hernia, colonic diverticulosis, stable left renal cysts  -Denies any diarrhea, hematemesis, hematochezia  -Mild N/V present secondary to recent administration of contrast dye,resolved  -Continue to monitor, consider repeat CT A/P if persistent/worsening abdominal pain        Chronic macrocytic anemia  -H/H stable on admission, no signs of active bleeding on exam, monitor CBC  -Vitamin B12 673 and iron 43 and iron sat 21        T2DM,diet controlled   -12/2020 A1C 5.4%  -Not on any outpatient medical management  currently        CKD stage 3A  -Cr 1.1, appears to be baseline per chart review, monitor BMP  -Avoid NSAIDs/nephrotoxins, renally dose medications  -Heparin prophylaxis dose adjusted for renal function   -cr 1.1 now      GERD  -Continue home pantoprazole and PRN Tums     Chronic constipation  -Continue home MiraLAX     Seasonal allergic rhinitis  -Continue Zyrtec (substitute for home Allegra), Flonase     Anxiety/depression  -Continue home venlafaxine     Osteoarthritis  S/p right TKA  -Continue home PRN lidocaine patches      DVT Prophylaxis:  Medication VTE Prophylaxis Orders: heparin (porcine) injection 5,000 Units  Mechanical VTE Prophylaxis Orders: Maintain sequential compression device      Foley/ lines-    I spent 40 minutes for this Pt including coordination of care for conditions described in my assessment and plan.       Case was discussed with Pt ,RN      Disposition:     Today's date: 06/26/2022  Length of Stay: 0  Anticipated medical stability for discharge: 1 day   Reason for ongoing hospitalization: anemia, uncontrolled HTN   Lines:     Patient Lines/Drains/Airways Status       Active PICC Line / CVC Line / PIV Line / Drain / Airway / Intraosseous Line / Epidural Line / ART Line / Line / Wound / Pressure Ulcer / NG/OG Tube       Name Placement date Placement time Site Days    Peripheral IV 06/23/22 20 G Standard Anterior;Distal;Left Upper Arm 06/23/22  1945  Upper Arm  2    External Urinary Catheter 01/22/21  0030  --  520                     Subjective/ROS/24 hr events:   CC: Mastoiditis  Interval History/24 hour events:   Had side effects from robaxin, wants to stop it   Uncontrolled HTN   Drop in Hgb    Diet Consistent Carbohydrate and Heart Healthy  HPI/Subjective:   Had nightmares overnight   Still c/o headaches   +increase in urinary frequency, likely related to stopping florinef   No other sx       Review of Systems:   Review of Systems - Negative except as above in HPI    Physical Exam:      Temp:  [97.3 F (36.3 C)-99.1 F (37.3 C)] 99.1 F (37.3 C)  Heart Rate:  [58-73] 66  Resp Rate:  [17-18] 18  BP: (114-219)/(61-77) 160/76  Intake and Output Summary-last 24 Hrs:  I/O last 3 completed shifts:  In: -   Out: [id number] [Urine:3850]  No data recorded by O2 Device: None (Room air)       General: WD female  in no acute distress,  Cardiovascular: regular rate and  rhythm, no murmurs  Lungs: clear to auscultation bilaterally, no additional sounds  Abdomen: soft, non-tender, non-distended; normoactive bowel sounds  Extremities: 1 + edema  Neurological: Alert and oriented X 3, moves all extremities.   Skin:no rash or lesion  Foley:  Central line:       Meds:   Medications were reviewed:  Scheduled Meds:  Current Facility-Administered Medications   Medication Dose Route Frequency    acetaminophen  1,000 mg Oral Q8H    aspirin EC  81 mg Oral Daily    atorvastatin  10 mg Oral Daily    cetirizine  10 mg Oral Daily    fluticasone  2 spray Each Nare Daily    heparin (porcine)  5,000 Units Subcutaneous Q12H [place]    magnesium sulfate  1 g Intravenous Daily    pantoprazole  40 mg Oral QAM AC    polyethylene glycol  17 g Oral Daily    ranolazine  500 mg Oral BID    [START ON 06/27/2022] valsartan  80 mg Oral Daily    venlafaxine  75 mg Oral QHS    vitamin D (ergocalciferol)  50,000 Units Oral Weekly    vitamins/minerals  1 tablet Oral Daily     Continuous Infusions:  PRN Meds:.acetaminophen, albuterol sulfate HFA, benzocaine-menthol, benzonatate, calcium carbonate, carboxymethylcellulose sodium, dextrose **OR** dextrose **OR** dextrose **OR** glucagon (rDNA), lidocaine, magnesium sulfate, melatonin, naloxone, ondansetron **OR** ondansetron, saline  Labs/Radiology:   Imaging personally reviewed, including: all available   MRA Head WO Contrast    Result Date: 06/25/2022  1.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 This finding is likely chronic given lack of acute  infarction seen on the recent MRI the brain. 2.There is no stenosis of the proximal left internal carotid artery based on NASCET criteria. 3.The vertebrobasilar arterial system is patent. 4.Aside from the right internal carotid artery, the remainder of the intracranial arterial vasculature appears patent. 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 This finding is likely chronic given lack of acute infarction seen on the recent MRI the brain. 2.There is no stenosis of the proximal left internal carotid artery based on NASCET criteria. 3.The vertebrobasilar arterial system is patent. 4.Aside from the right internal carotid artery, the remainder of the intracranial arterial vasculature appears patent. Dione Housekeeper, MD 06/25/2022 4:46 PM    MRV Head WO Contrast    Result Date: 06/25/2022  1.No evidence of venous sinus thrombosis Dione Housekeeper, MD 06/25/2022 4:37 PM   No results for input(s): "GLUCOSEWB" in the last 24 hours.  Recent Labs   Lab 06/26/22  0556 06/25/22  0636 06/24/22  0620 06/23/22  1841   Sodium 140 141 139 140   Potassium 4.5 4.6 4.6 4.5   Chloride 108 109 107 107   BUN 15.0 17.0 17.0 18.0   Creatinine 1.1* 1.1* 1.0 1.1*   eGFR 49.5* 49.5* 55.5* 49.5*   Glucose 110* 116* 149* 103*   Calcium 9.3 10.0 9.5 10.3*     Recent Labs   Lab 06/26/22  0556 06/25/22  0636 06/24/22  0620 06/23/22  1841   WBC 5.99 5.87 6.98 6.20   Hgb 10.6* 11.8 9.5* 10.3*   Hematocrit 32.1* 35.8 28.9* 30.4*   Platelets 171 168 177 185  Recent Labs   Lab 06/23/22  1841   Alkaline Phosphatase 85   Bilirubin, Total 0.3   ALT 20   AST (SGOT) 24     Recent Labs   Lab 06/23/22  1841   C-Reactive Protein 0.6   Procalcitonin 0.04         Signed by: Vic Ripper, MD

## 2022-06-26 NOTE — Progress Notes (Signed)
Patient BP was elevated Systolic [id number] MD aware with new order. PRN hydralazine given. Will continue monitor

## 2022-06-26 NOTE — Progress Notes (Signed)
Pt is positive for orthostatic hypotension     06/26/22 0023 06/26/22 0025 06/26/22 0027   Vital Signs   Temp Source Oral  --   --    Heart Rate (!) 58 66 72   Resp Rate '18 18 18   '$ BP 160/62 153/72 124/71   BP Location Right arm Right arm Right arm   MAP (mmHg) 95 99 89   Patient Position Lying Sitting Standing

## 2022-06-26 NOTE — Plan of Care (Signed)
NURSING SHIFT NOTE     Patient: Vanessa Kelly  Day: 0      SHIFT EVENTS     Shift Narrative/Significant Events (PRN med administration, fall, RRT, etc.):     Patient alert and oriented. On RA. BP was elevated scheduled medication given. Patient complaint of headache pain improved after tylenol was given.Patient OOB to commode. UA collected pending result. Patient was seen by MD discussed plan of care.  Safety and fall precautions remain in place. Purposeful rounding completed. Will continue monitor and make pt comfortable.          ASSESSMENT     Changes in assessment from patient's baseline this shift:    Neuro: No  CV: No  Pulm: No  Peripheral Vascular: No  HEENT: No  GI: No  BM during shift: No, Last BM:    GU: No   Integ: No  MS: No    Pain: Improved  Pain Interventions: Medications  Medications Utilized: Tylenol scheduled     Mobility: PMP Activity: Step 3 - Bed Mobility of             Lines     Patient Lines/Drains/Airways Status       Active Lines, Drains and Airways       Name Placement date Placement time Site Days    Peripheral IV 06/23/22 20 G Standard Anterior;Distal;Left Upper Arm 06/23/22  1945  Upper Arm  2    External Urinary Catheter 01/22/21  0030  --  520                         VITAL SIGNS     Vitals:    06/26/22 1150   BP: 114/71   Pulse: 71   Resp:    Temp:    SpO2: 97%       Temp  Min: 97.3 F (36.3 C)  Max: 98.1 F (36.7 C)  Pulse  Min: 58  Max: 73  Resp  Min: 17  Max: 18  BP  Min: 114/71  Max: 219/74  SpO2  Min: 87 %  Max: 100 %      Intake/Output Summary (Last 24 hours) at 06/26/2022 1207  Last data filed at 06/25/2022 1941  Gross per 24 hour   Intake --   Output 900 ml   Net -900 ml          CARE PLAN       Problem: Pain interferes with ability to perform ADL  Goal: Pain at adequate level as identified by patient  Flowsheets (Taken 06/26/2022 1201)  Pain at adequate level as identified by patient:   Identify patient comfort function goal   Assess pain on admission, during daily  assessment and/or before any "as needed" intervention(s)   Reassess pain within 30-60 minutes of any procedure/intervention, per Pain Assessment, Intervention, Reassessment (AIR) Cycle   Evaluate if patient comfort function goal is met   Evaluate patient's satisfaction with pain management progress   Offer non-pharmacological pain management interventions     Problem: Safety  Goal: Patient will be free from injury during hospitalization  Flowsheets (Taken 06/26/2022 1201)  Patient will be free from injury during hospitalization:   Assess patient's risk for falls and implement fall prevention plan of care per policy   Provide and maintain safe environment   Use appropriate transfer methods   Include patient/ family/ care giver in decisions related to safety   Ensure appropriate safety devices  are available at the bedside     Problem: Hemodynamic Status: Cardiac  Goal: Stable vital signs and fluid balance  Flowsheets (Taken 06/26/2022 1202)  Stable vital signs and fluid balance:   Assess signs and symptoms associated with cardiac rhythm changes   Monitor lab values     Problem: Inadequate Tissue Perfusion  Goal: Adequate tissue perfusion will be maintained  Flowsheets (Taken 06/26/2022 1202)  Adequate tissue perfusion will be maintained:   Monitor/assess lab values and report abnormal values   Monitor/assess neurovascular status (pulses, capillary refill, pain, paresthesia, paralysis, presence of edema)

## 2022-06-26 NOTE — UM Notes (Addendum)
Utilization Review - Continued Stay/Concurrent Review   Patient Name Vanessa Kelly   Patient Date of Birth 19-Dec-1940   Primary Coverage Listed Primary Coverage (Medicare)  Authorization number: NPR  Subscriber number: [id number]    Secondary Coverage Listed (If Applicable) Secondary Coverage (Tricare)  Authorization number: [place]  Subscriber [id number]    Synopsis of Initial Presentation 82 y.o. female with past medical history as below 3 weeks of worsening  headache mostly over her right forehead with blurry and sometimes double vision, and left ear pressure. Pt states her BP has also been running higher than normal. Pt taking tylenol at home with no relief.       Admission/Level of Care Order  (If Applicable) [id number]   Admit to Inpatient  Once        Diagnosis: Headache   Level of Care: Intermediate Care   Patient Class: Inpatient    References:    [facility] Bed Placement Criteria    [facility] Bed Placement Criteria    [facility] Bed Placement Criteria    [facility] Bed Placement Criteria    [facility] Bed Placement Criteria    Vanessa Service Definitions Feb 2023   Question Answer Comment   Admitting Physician AZAD, ATEFEH    Estimated Length of Stay > or = to 2 midnights    Tentative Discharge Plan? Home or Self Care    Does patient need telemetry? Yes    Is patient 18 yrs or greater? Yes    Telemetry type (separate Telemetry order is also required): Adult telemetry    Service: Medicine          06/25/2022  Hospital Day: 2 LOCATION: [facility] INTERMED CARE   Labs (Abnormal Only):     06/25/22 06:36   RBC 3.53 (Kelly)   MCV 101.4 (H)   Eosinophils Absolute Automated 0.52 (H)   Glucose 116 (H)   Creatinine 1.1 (H)   eGFR 49.5 !   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 This finding is likely  chronic given lack of acute infarction seen on the recent MRI the brain.   There is no stenosis of the proximal left internal carotid artery based on NASCET criteria.   The vertebrobasilar arterial system is patent.   Aside from the right internal carotid artery, the remainder of the intracranial arterial vasculature appears patent.    MRV Head WO Contrast  No evidence of venous sinus thrombosis    Medications Medication Dose Route Frequency    aspirin EC  81 mg Oral Daily    atorvastatin  10 mg Oral Daily    cetirizine  10 mg Oral Daily    fluticasone  2 spray Each Nare Daily    heparin (porcine)  5,000 Units Subcutaneous Q12H [place]    magnesium sulfate  1 g Intravenous Daily    methocarbamol  500 mg Oral BID    pantoprazole  40 mg Oral QAM AC    polyethylene glycol  17 g Oral Daily    ranolazine  500 mg Oral BID    valsartan  40 mg Oral Daily    venlafaxine  75 mg Oral QHS    vitamin D (ergocalciferol)  50,000 Units Oral Weekly  vitamins/minerals  1 tablet Oral Daily     Medication Dose Route    acetaminophen  650 mg Oral    albuterol sulfate HFA  1 puff Inhalation    benzocaine-menthol  1 lozenge Buccal    benzonatate  100 mg Oral    calcium carbonate  1 tablet Oral    carboxymethylcellulose sodium  1 drop Both Eyes    dextrose  15 g of glucose Oral    Or    dextrose  12.5 g Intravenous    Or    dextrose  12.5 g Intravenous    Or    glucagon (rDNA)  1 mg Intramuscular    lidocaine  1 patch Transdermal    magnesium sulfate  1 g Intravenous    melatonin  3 mg Oral    naloxone  0.2 mg Intravenous    ondansetron  4 mg Oral    Or    ondansetron  4 mg Intravenous    saline  2 spray Each Nare      Plan of Care '[]'$  Not Available at Time of Review Completion  Headache Vanessa since 3-4 weeks ago,could be med effect (florinef and also due to accelerated HTN,however, could be due to muscle spasm/neck pain,or sinusitis)  H/o orthostatic hypotension on Midodrine and Florinef   -discussed with neurologist,input appreciated  -based  on MRI,ordered MRA head and neck and due to visual changes, MRV is ordered to r/o venous thrombosis  -will give mg 1 g IV xdaily ,received a dose yesterday as well  -c/w Robaxin 500 mg BID as well  -will hold florinef and midodrine for now and see how BP does,  checked orthostatic        06/25/22 0835 06/25/22 0839 06/25/22 0842   Vital Signs   Heart Rate 65 (!) 56 80   BP 193/71 156/68 107/66   BP Location Left arm  --   --    MAP (mmHg) (!) 112 97 80   Patient Position Lying Sitting Standing    -Pt was not symptomatic standing   -PT/OT  -fall precaution        Left ear pain and reduced hearing ,mastoiditis is ruled out based on exam and MRI finding  Sphenoid and maxillary sinusitis noted on MRI brain  Right ear Cerumen   -ENT consulted and input appreciated  -afebrile,no leucocytosis, no pain at right mastoid, but pain on left side of neck,  -stopped IV abx which had ordered on admission on 2/23  -needs audiogram in office after discharge per ENT and cerumen debridement in office       Essential Hypertension with accelerated HTN on admission 236/96  H/o orthostatic hypotension  H/o Hyperlipidemia  CAD s/p CABG (2002), stents (2006)  Bilateral carotid artery stenosis  -Per patient, her BP has been elevated intermittently for the past 3-4 weeks  -BP elevated on admission, improved after IV hydralazine x1, continue to monitor  -01/2020 PET myocardial perfusion: small area of apical wall ischemia  -05/2020 carotid A dopplers: bilateral 50-69% stenosis of ICAs  -11/2020 echo: EF 65-70%, LVH, trace aortic regurgitation  -Continue valsartan (substitute for home candesartan), ranolazine, and hold midodrine 5 mg TID if SBP<120 and also noted on florinef 0.1 mg daily since 05/14/22,hold that too which could be causing headache/increased in BP,instead start compression stocking and abd binder and repeat orthostatic with those  -Continue home ASA and statin  -Followed by outpatient cardiologist [facility])      RLQ  abdominal pain on admission,denied pain for me,but mentions Right groin pain/RLE pain which is chronic  -Afebrile, no leukocytosis  -05/31/2022 CT A/P: large hiatal hernia, colonic diverticulosis, stable left renal cysts  -Denies any diarrhea, hematemesis, hematochezia  -Mild N/V present secondary to recent administration of contrast dye,resolved  -Continue to monitor, consider repeat CT A/P if persistent/worsening abdominal pain      Chronic macrocytic anemia  -H/H stable on admission, no signs of active bleeding on exam, monitor CBC  -Vitamin B12 673 and iron 43 and iron sat 21        T2DM,diet controlled   -12/2020 A1C 5.4%  -Not on any outpatient medical management currently        CKD stage 3A  -Cr 1.1, appears to be baseline per chart review, monitor BMP  -Avoid NSAIDs/nephrotoxins, renally dose medications  -Heparin prophylaxis dose adjusted for renal function   -cr 1.1 now      GERD  -Continue home pantoprazole and PRN Tums     Chronic constipation  -Continue home MiraLAX     Seasonal allergic rhinitis  -Continue Zyrtec (substitute for home Allegra), Flonase     Anxiety/depression  -Continue home venlafaxine     Osteoarthritis  S/p right TKA  -Continue home PRN lidocaine patches       Utilization Review Contact Pandora Leiter, RN, BSN  Utilization Review      Finance - [employer]  [address]  [place] [id number]  [place], [place] [address]  [phone]  Raahim [email or web address] .[facility]  [place], [place] [address]  NPI: [id number]     [facility]  [place], [place] [address]  NPI: [id number]     [facility]  [address] [place], [place] [address]  NPI: [id number]     [facility]  [address] [place], [place] [address]  NPI: [id number]     [facility]  [address] [place], [place] [address]  NPI: [id number]     Centralized Fax -- All Facilities  For Authorizations/Inquiries/Requests/Denials  [phone]    **This clinical review is based on/compiled from documentation provided by the treatment team within the patient's medical record.**    This communication may contain confidential and/or privileged information. Additionally, this communication may contain protected health information (PHI) that is legally protected from inappropriate disclosure by the [place] and Accountability Act (HIPAA) and relevant [employer] If you are not the intended recipient, please note that any dissemination, distribution or copying of this communication is strictly prohibited. If you have received this message in error, you should notify the sender immediately by telephone or by return e-mail and delete this message from your computer. Direct questions to the Sales executive at [phone].

## 2022-06-26 NOTE — Progress Notes (Signed)
Date Time: 06/26/22 3:01 PM  Patient Name: Vanessa Kelly  Attending Physician: Vic Ripper, MD  Patient Class: Inpatient  Hospital Day: 0            NEUROLOGY PROGRESS NOTE             Assessment/Plan   Headaches, with some migraine features, ongoing for the last 3 to 4 days, fluctuating, in intensity, and intermittent headaches, not sure about etiology, could be due to occipital neuralgia muscular spasm resulting in pain, could be due to hypertensive emergency given widely very wide variation in blood pressure readings Seems more likely that these are related to blood pressure changes and fluctuations  Severe orthostatic hypotension  Mild postural tremor  Recent Labs   Lab 06/24/22  0620   Hemoglobin A1C 5.4       Recent Labs   Lab 06/24/22  0620   Cholesterol 163   Triglycerides 39   HDL 74   LDL Calculated 81       Recent Labs   Lab 06/24/22  0620   LDL Calculated 81      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 She still complaining of headaches intermittently, but states it is doing significantly better    Review of Systems:   Noeye, ear nose, throat problems; no coughing or wheezing or shortness of breath,   No chest pain or orthopnea, no abdominal pain, nausea or vomiting,   No pain in the body or extremities, no psychiatric, neurological, endocrine, hematological   or cardiac complaints except as noted above.        Physical Exam:   BP 160/76   Pulse 66   Temp 99.1 F (37.3 C) (Oral)   Resp 18   Ht 1.702 m ('5\' 7"'$ )   Wt 84.8 kg (186 lb 15.2 oz)   SpO2 97%   BMI 29.28 kg/m     Neuro:  Level of consciousness:  Lying in bed awake and alert mild facial asymmetry moving both arms and legs well mild  discomfort on palpation of the paracervical region on the leftNo significant change in neuroexam looks more awake alert and in better spirits today  Meds:      Scheduled Meds: PRN Meds:    acetaminophen, 1,000 mg, Oral, Q8H  aspirin EC, 81 mg, Oral, Daily  atorvastatin, 10 mg, Oral, Daily  cetirizine, 10 mg, Oral, Daily  fluticasone, 2 spray, Each Nare, Daily  heparin (porcine), 5,000 Units, Subcutaneous, Q12H [facility]  magnesium sulfate, 1 g, Intravenous, Daily  pantoprazole, 40 mg, Oral, QAM AC  polyethylene glycol, 17 g, Oral, Daily  ranolazine, 500 mg, Oral, BID  [START ON 06/27/2022] valsartan, 80 mg, Oral, Daily  venlafaxine, 75 mg, Oral, QHS  vitamin D (ergocalciferol), 50,000 Units, Oral, Weekly  vitamins/minerals, 1 tablet, Oral, Daily        Continuous Infusions:   acetaminophen, 650 mg, Q4H PRN  albuterol sulfate HFA, 1 puff, Q4H PRN  benzocaine-menthol, 1 lozenge, Q2H PRN  benzonatate, 100 mg, TID PRN  calcium carbonate, 1 tablet, Q6H PRN  carboxymethylcellulose sodium, 1 drop, TID PRN  dextrose, 15 g of glucose, PRN  Or  dextrose, 12.5 g, PRN   Or  dextrose, 12.5 g, PRN   Or  glucagon (rDNA), 1 mg, PRN  lidocaine, 1 patch, Q24H PRN  magnesium sulfate, 1 g, PRN  melatonin, 3 mg, QHS PRN  naloxone, 0.2 mg, PRN  ondansetron, 4 mg, Q6H PRN   Or  ondansetron, 4 mg, Q6H PRN  saline, 2 spray, Q4H PRN                Labs:     Recent Labs   Lab 06/26/22  0556 06/25/22  0636 06/24/22  0620 06/23/22  1841   Glucose 110* 116* 149* 103*   BUN 15.0 17.0 17.0 18.0   Creatinine 1.1* 1.1* 1.0 1.1*   Calcium 9.3 10.0 9.5 10.3*   Sodium 140 141 139 140   Potassium 4.5 4.6 4.6 4.5   Chloride 108 109 107 107   CO2 '25 23 23 23   '$ Albumin  --   --   --  3.7   AST (SGOT)  --   --   --  24   ALT  --   --   --  20   Bilirubin, Total  --   --   --  0.3   Alkaline Phosphatase  --   --   --  85       Recent Labs   Lab 06/26/22  0556 06/25/22  0636 06/24/22  0620   WBC 5.99 5.87 6.98   Hgb 10.6* 11.8 9.5*   Hematocrit 32.1* 35.8 28.9*    MCV 100.6* 101.4* 100.3*   MCH 33.2 33.4 33.0   MCHC 33.0 33.0 32.9   Platelets 171 168 177                   Lab Results   Component Value Date    TSH 1.49 06/24/2022     Recent Labs   Lab 06/23/22  1841   C-Reactive Protein 0.6        Recent Labs   Lab 06/23/22  1841   Sed Rate 40*        .ll    No results for input(s): "PTT", "PT", "INR" in the last 72 hours.       Radiology Results (24 Hour)       Procedure Component Value Units Date/Time    MRA Head WO Contrast [id number] Collected: 06/25/22 1637    Order Status: Completed Updated: 06/25/22 1648    Narrative:      HISTORY: Abnormal MRI of the brain.    COMPARISON: MRA of the head and neck dated 04/01/2020.    TECHNIQUE:  MR angiogram of the head and neck performed without contrast.  MR angiogram of the neck performed without and with intravenous contrast.  Examinations performed on a Tesla scanner. 3D maximum intensity projection  (MIP) images were created and reviewed. Internal carotid stenosis was  determined based on NASCET criteria.    CONTRAST: gadoterate meglumine (CLARISCAN) 10 MMOL/20ML injection 20 mL    FINDINGS:  The great vessels originate from the aortic arch.    The right common carotid artery is patent. There is occlusion of the  cervical and intracranial portions of the right internal carotid artery  beginning at its origin, new compared to the prior MRA study but suspected  to be chronic given lack of acute infarction. There is reconstitution of of  a small portion of the right ICA terminus.     The left common  carotid artery is patent.  There is no stenosis of the  proximal left internal carotid artery.  The more distal cervical left  internal carotid artery is patent.  The intracranial left internal carotid  artery is patent.    The right vertebral artery is dominant. The cervical vertebral arteries are  patent. The intracranial vertebral arteries are patent. The basilar artery  is patent and normal in caliber.      The proximal visualized  portions of the anterior cerebral, middle cerebral,  and posterior cerebral arteries are patent.      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 This finding is likely chronic given lack of  acute infarction seen on the recent MRI the brain.  2.There is no stenosis of the proximal left internal carotid artery based  on NASCET criteria.  3.The vertebrobasilar arterial system is patent.  4.Aside from the right internal carotid artery, the remainder of the  intracranial arterial vasculature appears patent.    Dione Housekeeper, MD  06/25/2022 4:46 PM    MRA Neck W WO Contrast [id number] Collected: 06/25/22 1637    Order Status: Completed Updated: 06/25/22 1648    Narrative:      HISTORY: Abnormal MRI of the brain.    COMPARISON: MRA of the head and neck dated 04/01/2020.    TECHNIQUE:  MR angiogram of the head and neck performed without contrast.  MR angiogram of the neck performed without and with intravenous contrast.  Examinations performed on a Tesla scanner. 3D maximum intensity projection  (MIP) images were created and reviewed. Internal carotid stenosis was  determined based on NASCET criteria.    CONTRAST: gadoterate meglumine (CLARISCAN) 10 MMOL/20ML injection 20 mL    FINDINGS:  The great vessels originate from the aortic arch.    The right common carotid artery is patent. There is occlusion of the  cervical and intracranial portions of the right internal carotid artery  beginning at its origin, new compared to the prior MRA study but suspected  to be chronic given lack of acute infarction. There is reconstitution of of  a small portion of the right ICA terminus.     The left common carotid artery is patent.  There is no stenosis of the  proximal left internal carotid artery.  The more distal cervical left  internal carotid artery is patent.  The intracranial left internal carotid  artery is patent.    The right vertebral artery is  dominant. The cervical vertebral arteries are  patent. The intracranial vertebral arteries are patent. The basilar artery  is patent and normal in caliber.      The proximal visualized portions of the anterior cerebral, middle cerebral,  and posterior cerebral arteries are patent.      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 This finding is likely chronic given lack of  acute infarction seen on the recent MRI the brain.  2.There is no stenosis of the proximal left internal carotid artery based  on NASCET criteria.  3.The vertebrobasilar arterial system is patent.  4.Aside from the right internal carotid artery, the remainder of the  intracranial arterial vasculature appears patent.    Dione Housekeeper, MD  06/25/2022 4:46 PM    MRV Head WO Contrast [id number] Collected: 06/25/22 1634    Order Status:  Completed Updated: 06/25/22 1639    Narrative:      HISTORY: Headache.    COMPARISON: None available.    TECHNIQUE: MR venogram of the head performed on a 1.5 Tesla scanner without  intravenous contrast. 3D maximum intensity projection images were created  and reviewed.      CONTRAST: None.    FINDINGS:    The superior sagittal dural venous sinus is patent. The right and left  transverse dural venous sinuses and sigmoid dural venous sinuses are  patent.  The right transverse and sigmoid sinuses are dominant.    The internal cerebral veins, vein of [place], and straight dural venous sinus  are patent.      There is a normal, symmetric pattern of superficial cortical veins.      Impression:        1.No evidence of venous sinus thrombosis    Dione Housekeeper, MD  06/25/2022 4:37 PM             All brain imaging (MRI, CT) personally reviewed.    Case discussed with: pt and family               I personally reviewed all of the medications.  Medication list generated using all available resources.  Elder abuse (physical)  - negative  Advanced care plan -  reviewed from chart or in discussion with pt or family        This note was generated by the Epic EMR system/ Dragon speech recognition and may contain inherent errors or omissions not intended by the user. Grammatical errors, random word insertions, deletions and pronoun errors  are occasional consequences of this technology due to software limitations.   Not all errors are caught or corrected. If there are questions or concerns about the content of this note or information contained within the body of this dictation they should be addressed directly with the author for clarification.      Signed by: Dorothea Glassman, MD, MD  [place]:[id number]      Answering Service: [phone]

## 2022-06-26 NOTE — PT Eval Note (Signed)
Physical Therapy Evaluation  [name]      Post Acute Care Therapy Recommendations:     Discharge Recommendations:  ALF    DME needs IF patient is discharging home: No additional equipment/DME recommended at this time, Patient already has needed equipment    Therapy discharge recommendations may change with patient status.  Please refer to most recent note for up-to-date recommendations.           [facility]  [place], [place] [address]  [id number]    Physical Therapy Evaluation    Patient: CLOIS COLA MRN: [id number]   Unit: [facility] INTERMEDIATE CARE Bed: [id number]    Time of Treatment:   PT Received On: 06/26/22  Start Time: [id number]  Stop Time: 1508  Time Calculation (min): 17 min       Consult received for [name] for PT evaluation and treatment.  Patient's medical condition is appropriate for Physical Therapy  intervention at this time.    Interpreter utilized: no, not indicated      Assessment   SARYNITY BURGESON is a 82 y.o. female admitted 06/23/2022 for HA x3 wks, blurry vision, and L ear pressure. Pt has a PMHx of DM, GERD, HLD, HTN and orthostatic hypotension.   At her baseline, pt lives in an ALF. She is independent with her ADLs and mobility using a rollator.   Upon evaluation, pt presents with functional strength and range of motion of BLE. She completed independent bed mobility and SBA sit><stand. Significant drop in BP from sitting to standing with pt endorsing lightheadedness. Pt took a few sidesteps for repositioning purposes but further mobility deferred due to symptomatic orthostatic hypotension.   Pt would continue to benefit from therapy services to assess further mobility with stable vitals.       Pt's functional mobility is impacted by:  decreased balance and orthostatic blood pressure.  There are a few comorbidities or other factors that affect plan of care and require modification of task including: assistive device needed for mobility  and orthostasis.  Standardized tests and exams incorporated into evaluation include AMPAC mobility, balance, ROM , and Strength.  Pt demonstrates a evolving clinical presentation due to abnormal vital sign response to activity.   Pt would continue to benefit from PT to address these deficits and increase functional independence.     Complexity Level Hx and Co  morbidites Examination Clinical Decision Making Clinical Presentation   Moderate   1-2 factors 3 or more   Several options Evolving, plan may alter       PMP - Progressive Mobility Protocol   PMP Activity: Step 5 - Chair  Distance Walked (ft) (Step 6,7): 2 Feet       Rehabilitation Potential: Good       Interdisciplinary Communication: RN      Plan     Plan  Risks/Benefits/POC Discussed with Pt/Family: With patient  Treatment/Interventions: Exercise;Gait training;Patient/family training  PT Frequency: 3-4x/wk         Medical Diagnosis: Headache [R51.9]    History of Present Illness: KRISTOL CASIDA is a 82 y.o. female admitted on  06/23/2022 with "presented to ED for elevated BP x 3 weeks associated with worsening HA, bilateral blurry vision/diplopia, and left ear fullness/pressure. Patient also admits to drooling on exam today and RLQ abdominal pain. She denies any fever/chills, ear drainage, tinnitus, chest pain, shortness of breath, diarrhea, or urinary symptoms.  She has a history of chronic constipation  for which she takes MiraLAX outpatient. Currently experiencing mild nausea/vomiting during exam due to recent administration of contrast dye for imaging. Patient had recent CT scan of abdomen done on 05/31/2022 which demonstrated large hiatal hernia, colonic diverticulosis, and stable left renal cysts." Per H&P by PA Humphrey Rolls on 06/24/22        Patient Active Problem List   Diagnosis    Headache    Mastoiditis       Past Medical History:   Diagnosis Date    Diabetes mellitus     Gastroesophageal reflux disease     Hyperlipidemia     Hypertension     Seasonal  allergic rhinitis        Past Surgical History:   Procedure Laterality Date    BREAST BIOPSY Right     2014    HYSTERECTOMY      partial-1970's       Tests/Labs:  Lab Results   Component Value Date/Time    HGB 10.6 (L) 06/26/2022 05:56 AM    HCT 32.1 (L) 06/26/2022 05:56 AM    K 4.5 06/26/2022 05:56 AM    NA 140 06/26/2022 05:56 AM    TROPI 6.7 06/23/2022 06:41 PM    TROPI <2.7 01/18/2022 05:15 PM    TROPI <0.01 01/21/2021 02:30 PM         Imaging   MRA Head WO Contrast    Result Date: 06/25/2022  1.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 This finding is likely chronic given lack of acute infarction seen on the recent MRI the brain. 2.There is no stenosis of the proximal left internal carotid artery based on NASCET criteria. 3.The vertebrobasilar arterial system is patent. 4.Aside from the right internal carotid artery, the remainder of the intracranial arterial vasculature appears patent. 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 This finding is likely chronic given lack of acute infarction seen on the recent MRI the brain. 2.There is no stenosis of the proximal left internal carotid artery based on NASCET criteria. 3.The vertebrobasilar arterial system is patent. 4.Aside from the right internal carotid artery, the remainder of the intracranial arterial vasculature appears patent. Dione Housekeeper, MD 06/25/2022 4:46 PM    MRV Head WO Contrast    Result Date: 06/25/2022  1.No evidence of venous sinus thrombosis Dione Housekeeper, MD 06/25/2022 4:37 PM    Mammo Screening 3D/Tomo Bilateral    Result Date: 06/24/2022  No mammographic evidence of malignancy.     A LETTER WAS GENERATED TO THE PATIENT INDICATING THESE RESULTS.                                                             RECOMMENDATION:  SOCIETY OF BREAST IMAGING  AND AMERICAN COLLEGE OF RADIOLOGY RECOMMENDATIONS FOR IMAGING SCREENING FOR BREAST CANCER  Yearly screening mammogram from age 28* * for women at average risk of breast cancer. To see the full SBI/ACR recommendations go to http://www.mammographysaveslives.org/Facts/Guidelines BIRADS CATEGORY 01-NEGATIVE MAMMOGRAM (1A) Lennie Hummer, MD 06/24/2022 12:54 PM    MRI Brain with/without Contrast    Result Date: 06/23/2022  1.No acute infarct, evidence of hemorrhage, extra-axial collections, hydrocephalus, abnormal enhancement, or mass effect. 2.Abnormal T2 signal in the right internal carotid artery, different from MRI in 2022 and concerning for substantial flow limitation. MRA or CTA is recommended. 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 4.Background atrophy and mild white matter changes of chronic small vessel disease. 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 6.Nonspecific fluid in right mastoid air cells. Herbert Seta MD, MD 06/23/2022 9:21 PM    CT Head WO Contrast    Result Date: 06/23/2022   No CT evidence of acute intracranial hemorrhage, herniation, or hydrocephalus. Worsening mastoid effusions bilaterally as detailed above. Nelya Ebadirad 06/23/2022 7:36 PM     Social History: (Per family/care provider, if patient is not able to give accurate report)  Lives alone in a ALF.  Equipment at home:  rollator, shower chair  Prior Level of Function:     Cognition: intact    Mobility/Locomotion: independent w/ rollator   Feeding: independent   Grooming: independent   Bathing: independent   Dressing: independent   Toileting: independent  Pt reports history of dealing with orthostatic hypotension. She uses rollator so that if she feel it coming on, she can immediately sit. She also has a shower chair for this reason.     Subjective   "I have been dealing with this", in reference to her orthostatic hypotension  Patient is agreeable to  participation in the therapy session.  Patient's Goal:  be independent  Pain: pt denies pain      Objective     Precautions/ Contraindications:   Precautions  Weight Bearing Status: no restrictions  Other Precautions: fall risk, orthostatic hypotension    Patient is in bed with intravenous (IV) in place.       Observation of patient/vitals:    Supine - 189/85  Sitting - 180/80  Standing -  127/76  RN informed      Vitals:    06/26/22 0719 06/26/22 1150 06/26/22 1207 06/26/22 1559   BP: 191/77 114/71 160/76 170/73   Pulse: 64 71 66 68   Resp: 18      Temp: 97.9 F (36.6 C)  99.1 F (37.3 C) 98.6 F (37 C)   TempSrc: Oral  Oral Axillary   SpO2: (!) 87% 97% 97% 97%   Weight:       Height:           Orientation/Cognition:  Alert and Oriented x 4  Cognition: intact      Musculoskeletal Examination:      ROM Strength   RLE WFL WFL   LLE [facility] WFL       Functional Mobility:    Supine to sit: independent  Sit to Supine: independent  Sit to stand: SBA  Stand to sit: SBA    Ambulation:     Weightbearing: no restrictions   Assistance level: CGA   Distance: 71f sidestepping along edge of bed   Assistive Device: none       Balance:  Static Sit: Good  Dynamic Sit: Good  Static Stand: Fair    Endurance: Fair    Participation:  Good    Education:  Educated the patient to role of physical therapy, plan of care, goals  of therapy, rationale for progressing mobility and safety with mobility and ADLs.    RN notified of session outcome and that patient was left in bed with all needs met and equipment  intact.   Safety measures include: handoff to nurse/clin tech/ unit secretary completed and bed alarm activated.   Mobility and ADL status posted at bedside and within E.M.R.    AM-PACT Inpatient Short Forms  Inpatient AM-PACT Performed? (PT): Basic Mobility Inpatient Short Form   AM-PACT "6 Clicks" Basic Mobility Inpatient Short Form  Turning Over in Bed: None  Sitting Down On/Standing From Armchair: None  Lying on Back to Sitting on Side  of Bed: None  Assist Moving to/from Bed to Chair: A little  Assist to Walk in Hospital Room: A little  Assist to Climb 3-5 Steps with Railing: A lot  PT Basic Mobility Raw Score: 20  CMS 0-100% Score: 35.83%      Goals  Goal Formulation: With patient  Time for Goal Acheivement: 5 visits  Goals: Select goal  Pt Will Perform Sit to Stand: modified independent  Pt Will Ambulate: 151-200 feet;with rolling walker;modified independent     Therapist PPE during session procedural mask and gloves     Signature:  Helyn App, PT  06/26/2022  4:01 PM       (For scheduling questions, please contact rehab tech x [phone])

## 2022-06-26 NOTE — Nursing Progress Note (Signed)
Pt is Aox4, sating 99% on RA, tele shows NSR with 1st degree AV block, she denies CP/SOB. Pt screening when asleep, in the mean time her BP elevated. Provide aware  the  situation. Will continue monitoring the pt

## 2022-06-26 NOTE — ED Provider Notes (Signed)
EMERGENCY DEPARTMENT ATTENDING PHYSICIAN NOTE     I performed the substantive portion of the MDM. For the problems addressed, I personally developed, reviewed, and/or approved the plan and assessment as documented by the APP.  Patient was seen by APP, see their note for further documentation of history and exam.    BRIEF HISTORY OF PRESENT ILLNESS AND ADDITIONAL EXAM FINDINGS     Chief Complaint: Hypertension and Headache    82 y.o. female presents with gradual onset of a headache three weeks ago,   Without trauma, with accompanying blurred vision and nonspecific dizziness    Triage Vitals:  ED Triage Vitals   Enc Vitals Group      BP 06/23/22 1803 (!) 128/102      Heart Rate 06/23/22 1803 61      Resp Rate 06/23/22 1803 20      Temp 06/23/22 1803 98 F (36.7 C)      Temp Source 06/23/22 1803 Oral      SpO2 06/23/22 1803 99 %      Weight 06/23/22 1803 87.9 kg      Height 06/23/22 2220 1.702 m      Head Circumference --       Peak Flow --       Pain Score 06/23/22 1803 9      Pain Loc --       Pain Edu? --       Excl. in [place]     Vital Signs: Reviewed the patient's vital signs.   Nursing Notes: Reviewed and utilized available nursing notes.  Medical Records Reviewed: Reviewed available past medical records.    CARDIAC STUDIES      The following cardiac studies were independently interpreted by me,   the Emergency Medicine Physician    EKG - NSR @ 69 with SA !st degree AVB and and large voltage and inferior   Q-waves - unchanged from 01/08/22 except for rate    EMERGENCY IMAGING STUDIES      The following imagine studies were independently interpreted by me   (emergency medicine physician). I discussed testing results with the APP.    CT scan head (without contrast) - no fractures; no SDH or EDH or   SAH or ICH or edema  Impression - no CT findings related to the Chief Complaints    RADIOLOGY IMAGING STUDIES      MRA Neck W WO Contrast   Final Result      1.Occlusion of the right internal  carotid artery beginning at its origin,   new compared to 04/01/2020. This finding is likely chronic given lack of   acute infarction seen on the recent MRI the brain.   2.There is no stenosis of the proximal left internal carotid artery based   on NASCET criteria.   3.The vertebrobasilar arterial system is patent.   4.Aside from the right internal carotid artery, the remainder of the   intracranial arterial vasculature appears patent.      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 This finding is likely chronic given lack of   acute infarction seen on the recent MRI the brain.   2.There is no stenosis of the proximal left internal carotid artery based   on NASCET criteria.   3.The vertebrobasilar arterial system is patent.   4.Aside from the right internal carotid artery, the remainder of the   intracranial arterial vasculature appears patent.      Dione Housekeeper, MD   06/25/2022 4:46 PM      MRI Brain with/without Contrast   Final Result          1.No acute infarct, evidence of hemorrhage, extra-axial collections,   hydrocephalus, abnormal enhancement, or mass effect.   2.Abnormal T2 signal in the right internal carotid artery, different from   MRI in 2022 and concerning for substantial flow limitation. MRA or CTA is   recommended.   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   4.Background atrophy and mild white matter changes of chronic small vessel   disease.   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   6.Nonspecific fluid in right mastoid air cells.      Herbert Seta MD, MD   06/23/2022 9:21 PM      CT Head WO Contrast   Final Result       No CT evidence of acute  intracranial hemorrhage, herniation, or   hydrocephalus.       Worsening mastoid effusions bilaterally as detailed above.      Nelya Ebadirad   06/23/2022 7:36 PM        EMERGENCY DEPT. MEDICATIONS      ED Medication Orders (From admission, onward)      Start Ordered     Status Ordering Provider    06/23/22 2216 06/23/22 2215  ondansetron (ZOFRAN) injection 4 mg  Once        Route: Intravenous  Ordered Dose: 4 mg       Last MAR action: Given Oliver Hum    06/23/22 2106 06/23/22 2107  gadoterate meglumine (CLARISCAN) 7.5 MMOL/15ML injection 15 mL  IMG once as needed        Route: Intravenous  Ordered Dose: 15 mL       Last MAR action: Imaging Agent Given Oliver Hum    06/23/22 2000 06/23/22 1959  hydrALAZINE (APRESOLINE) injection 10 mg  Once        Route: Intravenous  Ordered Dose: 10 mg       Last MAR action: Given Oliver Hum    06/23/22 1948 06/23/22 1947  magnesium sulfate 1g in dextrose 5% 138m IVPB (premix)  Once        Route: Intravenous  Ordered Dose: 1 g       Last MAR action: SLavena Stanford   06/23/22 1943 06/23/22 1942  morphine injection 4 mg  Once        Route: Intravenous  Ordered Dose: 4 mg       Last MAR action: Given JOliver Hum   06/23/22 1943 06/23/22 1942  ondansetron (ZOFRAN) injection 4 mg  Once        Route: Intravenous  Ordered Dose: 4 mg       Last MAR action: Given [place], [place]   Ordered and independently interpreted AVAILABLE laboratory tests.   Results  Procedure Component Value Units Date/Time    Urinalysis Reflex to Microscopic Exam [id number] Collected: 06/26/22 1038    Specimen: Urine Updated: 06/26/22 1050     Urine Type Clean Catch     Color, UA Straw     Clarity, UA Clear     Specific Gravity UA 1.008     Urine pH 6.5     Leukocyte Esterase, UA Negative     Nitrite, UA Negative     Protein, UR Negative     Glucose, UA Negative     Ketones UA Negative     Urobilinogen, UA Normal mg/dL      Bilirubin, UA Negative      Blood, UA Negative    Culture Blood Aerobic and Anaerobic [id number] Collected: 06/24/22 0629    Specimen: Blood, Venipuncture Updated: 06/26/22 1028    Narrative:      The order will result in two separate 8-56m bottles  Please do NOT order repeat blood cultures if one has been  drawn within the last 48 hours  UNLESS concerned for  endocarditis  AVOID BLOOD CULTURE DRAWS FROM CENTRAL LINE IF POSSIBLE  Indications:->Bacteremia  ORDER#: [id number]                                   ORDERED BY: KHAN, SJimmey Ralph SOURCE: Blood, Venipuncture                          COLLECTED:  06/24/22 06:29  ANTIBIOTICS AT COLL.:                                RECEIVED :  06/24/22 09:35  Culture Blood Aerobic and Anaerobic        PRELIM      06/26/22 10:28  06/25/22   No Growth after 1 day/s of incubation.  06/26/22   No Growth after 2 day/s of incubation.      Culture Blood Aerobic and Anaerobic [[id number]: 06/24/22 0620    Specimen: Blood, Venipuncture Updated: 06/26/22 1028    Narrative:      The order will result in two separate 8-166mbottles  Please do NOT order repeat blood cultures if one has been  drawn within the last 48 hours  UNLESS concerned for  endocarditis  AVOID BLOOD CULTURE DRAWS FROM CENTRAL LINE IF POSSIBLE  Indications:->Bacteremia  ORDER#: [id number]                                  ORDERED BY: KHAN, SIDRA  SOURCE: Blood, Venipuncture                          COLLECTED:  06/24/22 06:20  ANTIBIOTICS AT COLL.:                                RECEIVED :  06/24/22 09:35  Culture Blood Aerobic and Anaerobic        PRELIM      06/26/22 10:28  06/25/22   No Growth after 1 day/s of incubation.  06/26/22   No Growth after 2 day/s of incubation.      Basic Metabolic Panel [  [id number]  (Abnormal) Collected: 06/26/22 0556    Specimen: Blood Updated: 06/26/22 0817     Glucose 110 mg/dL      BUN 15.0 mg/dL      Creatinine 1.1 mg/dL      Calcium 9.3 mg/dL      Sodium 140 mEq/L      Potassium 4.5 mEq/L      Chloride 108  mEq/L      CO2 25 mEq/L      Anion Gap 7.0     eGFR 49.5 mL/min/1.73 m2     Vancomycin, random [id number] Collected: 06/26/22 0556    Specimen: Blood Updated: 06/26/22 0816     Vancomycin Random 4.5 ug/mL      Vancomycin Time of Last Dose unk     Vancomycin Date of Last Dose 06/25/2022    CBC and differential [id number]  (Abnormal) Collected: 06/26/22 0556    Specimen: Blood Updated: 06/26/22 0742     WBC 5.99 x10 3/uL      Hgb 10.6 g/dL      Hematocrit 32.1 %      Platelets 171 x10 3/uL      RBC 3.19 x10 6/uL      MCV 100.6 fL      MCH 33.2 pg      MCHC 33.0 g/dL      RDW 13 %      MPV 10.9 fL      Instrument Absolute Neutrophil Count 3.23 x10 3/uL      Neutrophils 53.9 %      Lymphocytes Automated 27.0 %      Monocytes 8.0 %      Eosinophils Automated 10.4 %      Basophils Automated 0.5 %      Immature Granulocytes 0.2 %      Nucleated RBC 0.0 /100 WBC      Neutrophils Absolute 3.23 x10 3/uL      Lymphocytes Absolute Automated 1.62 x10 3/uL      Monocytes Absolute Automated 0.48 x10 3/uL      Eosinophils Absolute Automated 0.62 x10 3/uL      Basophils Absolute Automated 0.03 x10 3/uL      Immature Granulocytes Absolute 0.01 x10 3/uL      Absolute NRBC 0.00 x10 3/uL           Condition: fair/stable  Progress: improved    CRITICAL CARE/PROCEDURES      DIAGNOSIS    I (ED Physician) discussed final disposition with the APP    Diagnosis:  Final diagnoses:   Headache     Disposition:  ED Disposition       ED Disposition   Observation    Condition   --    Date/Time   Thu Jun 23, 2022  9:31 PM    Comment   Admitting Physician: Luana Shu [id number]   Service:: Medicine [106]   Estimated Length of Stay: < 2 midnights   Tentative Discharge Plan?: Home or Self Care [1]   Does patient need telemetry?: Yes   Is patient 18 yrs or greater?: Yes   Telemetry type (separate Telemetry order is also required):: Adult telemetry                    Lorin Picket, MD  06/26/22 2309

## 2022-06-27 LAB — BASIC METABOLIC PANEL
Anion Gap: 8 (ref 5.0–15.0)
BUN: 17 mg/dL (ref 7.0–21.0)
CO2: 26 mEq/L (ref 17–29)
Calcium: 9.8 mg/dL (ref 7.9–10.2)
Chloride: 106 mEq/L (ref 99–111)
Creatinine: 1.1 mg/dL — ABNORMAL HIGH (ref 0.4–1.0)
Glucose: 136 mg/dL — ABNORMAL HIGH (ref 70–100)
Potassium: 4.6 mEq/L (ref 3.5–5.3)
Sodium: 140 mEq/L (ref 135–145)
eGFR: 49.5 mL/min/{1.73_m2} — AB (ref >=60)

## 2022-06-27 LAB — CBC AND DIFFERENTIAL
Absolute NRBC: 0 10*3/uL (ref 0.00–0.00)
Basophils Absolute Automated: 0.03 10*3/uL (ref 0.00–0.08)
Basophils Automated: 0.4 %
Eosinophils Absolute Automated: 0.54 10*3/uL — ABNORMAL HIGH (ref 0.00–0.44)
Eosinophils Automated: 7.9 %
Hematocrit: 34.7 % (ref 34.7–43.7)
Hgb: 11.4 g/dL (ref 11.4–14.8)
Immature Granulocytes Absolute: 0.02 10*3/uL (ref 0.00–0.07)
Immature Granulocytes: 0.3 %
Instrument Absolute Neutrophil Count: 3.93 10*3/uL (ref 1.10–6.33)
Lymphocytes Absolute Automated: 1.67 10*3/uL (ref 0.42–3.22)
Lymphocytes Automated: 24.5 %
MCH: 32.9 pg (ref 25.1–33.5)
MCHC: 32.9 g/dL (ref 31.5–35.8)
MCV: 100.3 fL — ABNORMAL HIGH (ref 78.0–96.0)
MPV: 10.7 fL (ref 8.9–12.5)
Monocytes Absolute Automated: 0.62 10*3/uL (ref 0.21–0.85)
Monocytes: 9.1 %
Neutrophils Absolute: 3.93 10*3/uL (ref 1.10–6.33)
Neutrophils: 57.8 %
Nucleated RBC: 0 /100 WBC (ref 0.0–0.0)
Platelets: 200 10*3/uL (ref 142–346)
RBC: 3.46 10*6/uL — ABNORMAL LOW (ref 3.90–5.10)
RDW: 13 % (ref 11–15)
WBC: 6.81 10*3/uL (ref 3.10–9.50)

## 2022-06-27 NOTE — Plan of Care (Signed)
Problem: Pain interferes with ability to perform ADL  Goal: Pain at adequate level as identified by patient  Flowsheets (Taken 06/27/2022 0800)  Pain at adequate level as identified by patient:   Identify patient comfort function goal   Assess for risk of opioid induced respiratory depression, including snoring/sleep apnea. Alert healthcare team of risk factors identified.   Assess pain on admission, during daily assessment and/or before any "as needed" intervention(s)         RN thoroughly assessed patient's learning abilities.    Patient Status: Patient has no complaints of pain at this time ( 0/10 ). No evidence of grimace and any other signs of pain nor any kind of discomfort.     Individualized Plan of Care for today:  Continue to monitor pain level and medicate if needed. RN will continue to monitor pain status. Patient educated about about pain management and was asked to seek assistance if necessary. Patient was notified that the RN will conduct a purposeful hourly rounding through out the entire shift to attend her needs. Hand out for pain management was given to the patient and explained on layman's term. Today's plan of care and goals were discussed with the patient, who agrees to it and was able to verbalize and demonstrate understanding of the disease process, treatment plan, medications and consequences of noncompliance. All questions and concerns were addressed.

## 2022-06-27 NOTE — Progress Notes (Addendum)
Date Time: 06/27/22 2:17 PM  Patient Name: Vanessa Kelly  Attending Physician: Christell Faith, MD  Patient Class: Inpatient  Hospital Day: 1            NEUROLOGY PROGRESS NOTE             Assessment/Plan   Headaches, with some migraine features, ongoing for the last 3 to 4 days, fluctuating, in intensity, and intermittent headaches, Seems more likely that these are related to blood pressure changes and fluctuations---better   Severe orthostatic hypotension  Mild postural tremor  Recent Labs   Lab 06/24/22  0620   Hemoglobin A1C 5.4     Recent Labs   Lab 06/24/22  0620   Cholesterol 163   Triglycerides 39   HDL 74   LDL Calculated 81     Recent Labs   Lab 06/24/22  0620   LDL Calculated 81    Blood pressure still fluctuating significantly, 191/77, to 120/69      orthostatics, dropped from 136/71 sitting to 73/42 standing      Continue to monitor orthostasis, will advise accordingly  Will stop IV magnesium  Will add Mestinon      Subjective:   Patient Seen and Examined. Headaches are markedly better blood pressure also better, patient only on Diovan 80 mg now    Review of Systems:   No headache, eye, ear nose, throat problems; no coughing or wheezing or shortness of breath,   No chest pain or orthopnea, no abdominal pain, nausea or vomiting,   No pain in the body or extremities, no psychiatric, neurological, endocrine, hematological   or cardiac complaints except as noted above.          Physical Exam:   BP 120/69   Pulse 74   Temp 97.7 F (36.5 C) (Oral)   Resp 18   Ht 1.702 m ('5\' 7"'$ )   Wt 84.8 kg (186 lb 15.2 oz)   SpO2 99%   BMI 29.28 kg/m     Neuro:  Level of consciousness:  Lying in bed looks much more awake and alert, looks quite comfortable face slight asymmetry other than that no other neuro's deficits  Meds:      Scheduled Meds: PRN Meds:    acetaminophen, 1,000 mg, Oral, Q8H  aspirin EC, 81 mg, Oral, Daily  atorvastatin, 10 mg, Oral, Daily  cetirizine, 10 mg, Oral, Daily  fluticasone, 2 spray,  Each Nare, Daily  heparin (porcine), 5,000 Units, Subcutaneous, Q12H SCH  magnesium sulfate, 1 g, Intravenous, Daily  pantoprazole, 40 mg, Oral, QAM AC  polyethylene glycol, 17 g, Oral, Daily  ranolazine, 500 mg, Oral, BID  valsartan, 80 mg, Oral, Daily  venlafaxine, 75 mg, Oral, QHS  vitamin D (ergocalciferol), 50,000 Units, Oral, Weekly  vitamins/minerals, 1 tablet, Oral, Daily        Continuous Infusions:   acetaminophen, 650 mg, Q4H PRN  albuterol sulfate HFA, 1 puff, Q4H PRN  benzocaine-menthol, 1 lozenge, Q2H PRN  benzonatate, 100 mg, TID PRN  calcium carbonate, 1 tablet, Q6H PRN  carboxymethylcellulose sodium, 1 drop, TID PRN  dextrose, 15 g of glucose, PRN   Or  dextrose, 12.5 g, PRN   Or  dextrose, 12.5 g, PRN   Or  glucagon (rDNA), 1 mg, PRN  hydrALAZINE, 10 mg, Q6H PRN  lidocaine, 1 patch, Q24H PRN  magnesium sulfate, 1 g, PRN  melatonin, 3 mg, QHS PRN  naloxone, 0.2 mg, PRN  ondansetron, 4 mg, Q6H PRN   Or  ondansetron, 4 mg, Q6H PRN  saline, 2 spray, Q4H PRN                Labs:     Recent Labs   Lab 06/27/22  0643 06/26/22  0556 06/25/22  0636 06/24/22  0620 06/23/22  1841   Glucose 136* 110* 116*  More results in Results Review 103*   BUN 17.0 15.0 17.0  More results in Results Review 18.0   Creatinine 1.1* 1.1* 1.1*  More results in Results Review 1.1*   Calcium 9.8 9.3 10.0  More results in Results Review 10.3*   Sodium 140 140 141  More results in Results Review 140   Potassium 4.6 4.5 4.6  More results in Results Review 4.5   Chloride 106 108 109  More results in Results Review 107   CO2 '26 25 23  '$ More results in Results Review 23   Albumin  --   --   --   --  3.7   AST (SGOT)  --   --   --   --  24   ALT  --   --   --   --  20   Bilirubin, Total  --   --   --   --  0.3   Alkaline Phosphatase  --   --   --   --  85   More results in Results Review = values in this interval not displayed.     Recent Labs   Lab 06/27/22  0643 06/26/22  0556 06/25/22  0636   WBC 6.81 5.99 5.87   Hgb 11.4 10.6* 11.8    Hematocrit 34.7 32.1* 35.8   MCV 100.3* 100.6* 101.4*   MCH 32.9 33.2 33.4   MCHC 32.9 33.0 33.0   Platelets 200 171 168                 Lab Results   Component Value Date    TSH 1.49 06/24/2022     Recent Labs   Lab 06/23/22  1841   C-Reactive Protein 0.6      Recent Labs   Lab 06/23/22  1841   Sed Rate 40*      .ll    No results for input(s): "PTT", "PT", "INR" in the last 72 hours.       Radiology Results (24 Hour)       ** No results found for the last 24 hours. **             All brain imaging (MRI, CT) personally reviewed.    Case discussed with: pt               I personally reviewed all of the medications.  Medication list generated using all available resources.  Elder abuse (physical)  - negative  Advanced care plan - reviewed from chart or in discussion with pt or family        This note was generated by the Epic EMR system/ Dragon speech recognition and may contain inherent errors or omissions not intended by the user. Grammatical errors, random word insertions, deletions and pronoun errors  are occasional consequences of this technology due to software limitations.   Not all errors are caught or corrected. If there are questions or concerns about the content of this note or information contained within the body of this dictation they should be addressed directly with the author for clarification.      Signed by: Bella Kennedy  Noralee Chars, MD, MD  [place]:[id number]      Answering Service: [phone]

## 2022-06-27 NOTE — Progress Notes (Signed)
I called patient's daughter, Danae Chen, all questions were answered.  She believes that her mom continue to have symptoms however it is better than before.

## 2022-06-27 NOTE — Plan of Care (Signed)
Problem: Pain interferes with ability to perform ADL  Goal: Pain at adequate level as identified by patient  Outcome: Progressing  Flowsheets (Taken 06/27/2022 0015)  Pain at adequate level as identified by patient:   Identify patient comfort function goal   Assess for risk of opioid induced respiratory depression, including snoring/sleep apnea. Alert healthcare team of risk factors identified.   Assess pain on admission, during daily assessment and/or before any "as needed" intervention(s)   Reassess pain within 30-60 minutes of any procedure/intervention, per Pain Assessment, Intervention, Reassessment (AIR) Cycle   Evaluate if patient comfort function goal is met   Evaluate patient's satisfaction with pain management progress   Offer non-pharmacological pain management interventions     Problem: Moderate/High Fall Risk Score >5  Goal: Patient will remain free of falls  Outcome: Progressing  Flowsheets (Taken 06/26/2022 2052)  Moderate Risk (6-13):   MOD-Apply bed exit alarm if patient is confused   MOD-Remain with patient during toileting   MOD-Re-orient confused patients   MOD-Perform dangle, stand, walk (DSW) prior to mobilization

## 2022-06-27 NOTE — Progress Notes (Signed)
CM Update:     Sac Plan: return to [place]. [facility], room 320. Pt and daughter in agreement. CM called the facility, no answer left VM.     [place] Transportation: per pt's daugjter, she will provide.        Ludger Nutting, BSN, RN  RN Case Manager   [phone]  (F): [phone]

## 2022-06-27 NOTE — PT Progress Note (Signed)
Physical Therapy Note  Physical Therapy Treatment  [name]  Post Acute Care Therapy Recommendations:     Discharge Recommendations:  ALF    If ALF  recommended discharge disposition is not available, patient will need CG assist for ambulation and SNF.     DME needs IF patient is discharging home: No additional equipment/DME recommended at this time, Patient already has needed equipment    Therapy discharge recommendations may change with patient status.  Please refer to most recent note for up-to-date recommendations.           [place] [id number]  [phone]    Physical Therapy Treatment    Patient:  Vanessa Kelly        MRN#:  [id number]  Unit:  [facility] INTERMEDIATE CARE        Room/Bed:  [id number]    Medical Diagnosis: Headache [R51.9]    Time of treatment:  PT Received On: 06/27/22  Start Time: 1406   Stop Time: 1440  Time Calculation (min): 34 min       Treatment #: PT Visit Number: 1/5    Patient's medical condition is appropriate for Physical Therapy intervention at this time.    Interpreter utilized: yes, phone interpreter in I  no, not indicated    Assessment   Pt alert, cooperative. Stating she usually wears (and independently dons) her compression stockings when OOB, and is amenable to trying abdominal binder to manage BP drop with OOB. Pt demonstrates Indep bed mob, needs SBA sit><stand, was provided with abdominal binder (mod A to don) and stockings prior to OOB. PT performed seated LE exs to help elevate BP as well and was progressed to CG ambulation 20' going forward/back with RW. Pt still with c/o 4/10 dizziness but wanting to continue with trial of ambulation. Pt remains limited by symptomatic orthostatic hypotension w/ BP 144/72-80/51 during this session. BP at end of session hight enough to attempt progressive ambulation next session with close W/C follow, and with compression garments in place.    PMP - Progressive Mobility Protocol    PMP Activity: Step 6 - Walks in Room  Distance Walked (ft) (Step 6,7): 20 Feet (forward/back several times with RW, compression hose, binder)     Plan     Continue plan of care. Progress ambulation with close W/C follow and BP monitoring.    Interdisciplinary Communication: Nursing made aware of session outcome.    Subjective   I usually do have my compression stockings on me when I get up.  Patient is agreeable to participation in the therapy session. Nursing clears patient for therapy.  Pain: pt denies pain    Vitals: Supine initially BP 144/72, 108/67 seated, 151/73 seated after LE exs, binder and compression hose; dropped as below with standing/walking.  Vitals:    06/27/22 1430 06/27/22 1435 06/27/22 1440   BP: 94/57 (!) 80/51 130/72    Standing Walking Supine   Pulse:      Resp:      Temp:      TempSrc:      SpO2:      Weight:      Height:          Objective     Precautions/ Contraindications:   Precautions  Weight Bearing Status: no restrictions  Other Precautions: fall risk, orthostatic hypotension    Patient is in bed with telemetry, PrimaFit (external female catheter), intravenous access, and bed alarm  in place.      Therapeutic exercises:  Pt performed seated LE exercises 10 x each:  Alternating hip flexion  Hip abduction/adduction with BUE resist  Alternating Knee ext (LAQ)  Ankle DF/PF  Ankle Inversion/Eversion    Functional Mobility:  Rolling: indep  Supine to Sit: mod I  Scooting: mod I  Sit to Supine: mod I  Sit to Stand: CG  Stand to Sit: CG  Transfer Training: on/off EOB x4 reps    Gait:   WB status: FWB  Assistive Device: RW  Assist Level: CG  Distance: 4x5' forward, bacward  Pattern: WFL     Educated the patient to role of physical therapy, plan of care, goals  of therapy, rationale for progressing mobility and HEP, safety with mobility and ADLs, energy conservation techniques, home safety, and rationale for compression .    RN notified of session outcome and that patient was left in bed with  all needs met and equipment intact.   Safety measures include: handoff to nurse/clin tech/ unit secretary completed, bed alarm activated, oriented to call bell and placed within reach, personal items within reach, assistive device positioned out of reach, and bed placed in lowest position.   Mobility and ADL status posted at bedside and within E.M.R.    Goals per Eval/ Re-eval:   Goals  Goal Formulation: With patient  Time for Goal Acheivement: 5 visits  Goals: Select goal  Pt Will Perform Sit to Stand: modified independent  Pt Will Ambulate: 151-200 feet, with rolling walker, modified independent        Therapist PPE during session gloves     Signature:  Gwenlyn Found, PT  06/27/2022 2:59 PM        (For scheduling questions, please contact rehab tech x [phone])

## 2022-06-27 NOTE — PT Progress Note (Signed)
Physical Therapy Note  [facility]  Physical Therapy Attempt Note    Patient:  Vanessa Kelly MRN#:  [id number]    Unit:  [facility] INTERMEDIATE CARE Room/Bed:  MI629/MI629-02    PT Cancellation: Visit  PT Visit Cancellation Reason: Patient/caregiver declines therapy at this time (pt eating lunch)      Signature:   Gwenlyn Found, PT  06/27/2022  1:26 PM       (For scheduling questions, please contact rehab tech x [phone])

## 2022-06-27 NOTE — OT Eval Note (Signed)
Occupational Therapy Evaluation  [facility] [facility] Therapy Recommendations:     Discharge Recommendations:  ALF      DME needs IF patient is discharging home: No additional equipment/DME recommended at this time, Patient already has needed equipment    Therapy discharge recommendations may change with patient status.  Please refer to most recent note for up-to-date recommendations.         [facility]  [place], [place] [address]  [id number]    Occupational Therapy Evaluation    Patient: Vanessa Kelly MRN: [id number]   Unit: [facility] INTERMEDIATE CARE Bed: [id number]    Time of treatment:   OT Received On: 06/27/22  Start Time: 0820  Stop Time: 0849  Time Calculation (min): 29 min       Consult received for [name] for OT evaluation and treatment.  Patient's medical condition is appropriate for Occupational Therapy  intervention at this time.    Interpreter utilized: no, not indicated    Assessment     Vanessa Kelly is a 82 y.o. 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 Pt resides at an ALF w/elevator access and has assistance as needed for showers (supervision) and IADLs. Pt states she was independent with other ADLs and functional mobility w/use of rollator. Pt's bathroom has a walk-in shower w/shower chair and grab bar as well as elevated toilet w/grab bar. Pt reports 2-47flls in the past 6 months, due to BP per pt.  Pt received semi-supine in bed and was agreeable to OT session. Pt completed supine<>sit at EOB with independently. Sit<>stand from edge of bed completed w/CGA. BP while seated was 136/71 and upon standing was 73/42  w/orthostatic symptoms of after ~156m of standing. Face/hand hygiene completed independently, UB dressing with supervision, LB dressing (sock mgmt) with Mod A secondary, sit steps to head of bed ~[id number] with CGA and verbal cuing for safety with use of FWW. Pt's independence and participation also impacted by orthostatic symptoms/signs. Pt will continue to benefit from further skilled OT services while admitted to address impairments and facilitate safe return to prior living environment (ALF) At the end of session, pt presented in bed w/head of bed elevated and eating breakfast w/call bell and all items needed placed within reach.     Expanded chart review completed including review of labs and review of vitals.  Pt's ability to complete ADLs and functional transfers is impaired due to the following deficits: decreased activity tolerance, decreased balance, abnormal blood pressure, and orthostatic blood pressure.  Pt demonstrates performance deficits with bathing, dressing, toileting, and functional mobility. There are a few comorbidities or other factors that affect plan of care and require modification of task including: assistive device needed for mobility, frequent falls, and orthostasis.  Pt would continue to benefit from OT to address these deficits and increase functional independence.        Complexity Chart Review Performance Deficits Clinical Decision Making Hx/Comorbidities Assistance needed   Moderate Expanded 3-5 Several Options 1-2 Min/Mod assist (not at baseline)       PMP - Progressive Mobility Protocol   PMP Activity: Step 6 -  Walks in Room  Distance Walked (ft) (Step 6,7): 5 Feet     Rehabilitation Potential: Good    Interdisciplinary Communication: RN    Plan     OT Plan  Risks/Benefits/POC Discussed with Pt/Family: With patient  Treatment Interventions: ADL retraining;Functional transfer training;UE strengthening/ROM;Endurance training;Patient/Family training;Neuro muscular  reeducation;Compensatory technique education  Discharge Recommendation: ALF  DME Recommended for Discharge: No additional equipment/DME recommended at this time;Patient already has needed equipment  OT Frequency Recommended: 2-3x/wk         Medical Diagnosis: Headache [R51.9]    History of Present Illness: Vanessa Kelly is a 82 y.o. female admitted on  06/23/2022 "who presented to ED for elevated BP x 3 weeks associated with worsening HA, bilateral blurry vision/diplopia, and left ear fullness/pressure. Patient also admits to drooling on exam today and RLQ abdominal pain. She denies any fever/chills, ear drainage, tinnitus, chest pain, shortness of breath, diarrhea, or urinary symptoms.  She has a history of chronic constipation for which she takes MiraLAX outpatient. Currently experiencing mild nausea/vomiting during exam due to recent administration of contrast dye for imaging. Patient had recent CT scan of abdomen done on 05/31/2022 which demonstrated large hiatal hernia, colonic diverticulosis, and stable left renal cysts. "        Patient Active Problem List   Diagnosis    Headache    Mastoiditis       Past Medical History:   Diagnosis Date    Diabetes mellitus     Gastroesophageal reflux disease     Hyperlipidemia     Hypertension     Seasonal allergic rhinitis        Past Surgical History:   Procedure Laterality Date    BREAST BIOPSY Right     2014    HYSTERECTOMY      partial-1970's       Tests/Labs:  Lab Results   Component Value Date/Time    HGB 11.4 06/27/2022 06:43 AM    HCT 34.7 06/27/2022 06:43 AM    K 4.6 06/27/2022 06:43 AM    NA 140 06/27/2022 06:43 AM    TROPI 6.7 06/23/2022 06:41 PM    TROPI <2.7 01/18/2022 05:15 PM    TROPI <0.01 01/21/2021 02:30 PM         Imaging:  MRA Head WO Contrast    Result Date: 06/25/2022  1.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 This finding is likely chronic given lack of acute infarction seen on the recent  MRI the brain. 2.There is no stenosis of the proximal left internal carotid artery based on NASCET criteria. 3.The vertebrobasilar arterial system is patent. 4.Aside from the right internal carotid artery, the remainder of the intracranial arterial vasculature appears patent. 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 This finding is likely chronic given lack of acute infarction seen on the recent MRI the brain. 2.There is no stenosis of the proximal left internal carotid artery based on NASCET criteria. 3.The vertebrobasilar arterial system is patent. 4.Aside from the right internal carotid artery, the remainder of the intracranial arterial vasculature appears patent. Dione Housekeeper, MD 06/25/2022 4:46 PM    MRV Head WO Contrast    Result Date: 06/25/2022  1.No evidence of venous sinus thrombosis Dione Housekeeper, MD 06/25/2022 4:37 PM  Mammo Screening 3D/Tomo Bilateral    Result Date: 06/24/2022  No mammographic evidence of malignancy.     A LETTER WAS GENERATED TO THE PATIENT INDICATING THESE RESULTS.                                                             RECOMMENDATION:  SOCIETY OF BREAST IMAGING AND AMERICAN COLLEGE OF RADIOLOGY RECOMMENDATIONS FOR IMAGING SCREENING FOR BREAST CANCER  Yearly screening mammogram from age 23* * for women at average risk of breast cancer. To see the full SBI/ACR recommendations go to http://[email or web address].org/Facts/Guidelines BIRADS CATEGORY 01-NEGATIVE MAMMOGRAM (1A) Lennie Hummer, MD 06/24/2022 12:54 PM     MRI Brain with/without Contrast    Result Date: 06/23/2022   1.No acute infarct, evidence of hemorrhage, extra-axial collections, hydrocephalus, abnormal enhancement, or mass effect. 2.Abnormal T2 signal in the right internal carotid artery, different from MRI in 2022 and concerning for substantial flow limitation. MRA  or CTA is recommended. 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 4.Background atrophy and mild white matter changes of chronic small vessel disease. 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 6.Nonspecific fluid in right mastoid air cells. Herbert Seta MD, MD 06/23/2022 9:21 PM    CT Head WO Contrast    Result Date: 06/23/2022   No CT evidence of acute intracranial hemorrhage, herniation, or hydrocephalus. Worsening mastoid effusions bilaterally as detailed above. Nelya Ebadirad 06/23/2022 7:36 PM      Social History: (Per family/care provider, if patient is not able to give accurate report)  Lives alone in an ALF apartment on 2nd floor w/elevator access.  Entry Steps: 1 flight (Pt does not use stairs)   Rails: Y   Equipment at home:  single point cane and rollator  Prior Level of Function:  Independent with ADLs except shower - requires supervision; Mod Independent with functional mobility and assistance with IADLs.    Cognition: Intact    Mobility: Mod Independent w/use of rollator   Feeding: Independent   Grooming: Independent   Bathing: Supervision   Dressing: Independent   Toileting: Independent    Subjective   "I can do more when  my blood pressure is better"  Patient is agreeable to participation in the therapy session. Nursing clears patient for therapy.  Patient's Goal:  Pt wishes to return to ALF when medically stable.  Patient is satisfied with therapist intervention.    Objective     Precautions:   Precautions  Weight Bearing Status: no restrictions  Other Precautions: fall risk, orthostatic hypotension    Patient is in bed with PrimaFit (external female catheter) and intravenous (IV) in place.       Observation of patient/vitals:   Vitals:    06/27/22 0237 06/27/22 0428 06/27/22 0733 06/27/22 0800   BP: 168/73 134/70 150/71 149/78   Pulse: 71 79 71 75   Resp:  16 18    Temp:  97.9 F (36.6 C) 98.2 F (36.8 C)    TempSrc:  Oral  Oral    SpO2:  97% 98%    Weight:       Height:           Orientation/Cognition:     Alert and Oriented x 4  Cognition: Intact  Musculoskeletal Examination:     ROM Strength   Neck/ Trunk WFL WFL   RUE WFL WFL   LUE WFL WFL         Sensation: Intact to light touch, denies numbness/tingling throughout BUE   Coordination: Intact gross motor and serial opposition to B hands    Vision: WFL    Hearing: WFL      Functional Mobility:  Rolling: Independent    Supine to sit: Independent  Scooting: Independent  Sit to Supine: Independent  Sit to stand: CGA  Stand to sit: CGA  Transfers: CGA  Ambulation: CGA (side steps to head of bed w/FWW ~54f)    Balance:  Static Sit Balance: Good  Dynamic Sit Balance: Fair  Static Stand Balance: Fair-  Dynamic Stand Balance: Poor+    Self Care:  Feeding: Independent  Grooming: Independent  UB Dressing: Supervision  LB Dressing: Mod A      Endurance: Fair    Participation:  Fair    Education:  Educated the pHydrographic surveyorto role of occupational therapy, plan of care, goals  of therapy, rationale for progressing mobility and safety with mobility and ADLs and energy conservation techniques.    RN notified of session outcome and that patient was left in bed with all needs met and equipment intact.   Safety measures include: bed alarm activated, oriented to call bell and placed within reach, and personal items within reach.   Mobility and ADL status posted at bedside and within E.M.R.        AM-PACT "6 Clicks" Daily Activity Inpatient Short Form  Inpatient AM-PACT Performed?: yes  Put On/Take Off Lower Body Clothing: 3  Assist with Bathing: 3  Assist with Toileting: 3  Put On/Take Off Upper Body Clothing: 4  Assist with Grooming: 4  Assist with Eating: 4  OT Daily Activity Raw Score: 21  CMS 0-100% Score: 32.79%       Goals:  Goals  Goal Formulation: Patient  Time For Goal Achievement: by time of discharge  Goals: Select goal  Patient will dress lower body: Modified  Independent  Patient will toilet: Modified Independent  Pt will perform functional transfers: Modified Independent    Modified Rankin Scale   I am certified in mRS: Yes  mRS Assessment Source: Patient  mRS Value: 3 - Moderate disability    Therapist PPE during session procedural mask and gloves      Signature:   LSydnee Cabal OT  06/27/2022  9:58 AM    (For scheduling questions, please contact rehab tech [phone]

## 2022-06-27 NOTE — Progress Notes (Signed)
MEDICINE PROGRESS NOTE  [facility], DIVISION OF HOSPITALIST MEDICINE   [place] Pager: [phone]      Date Time: 06/27/22 1:44 PM  Patient Name: Vanessa Kelly  Attending Physician: Christell Faith, [facility] [facility] Day: 5  Assessment and Plan :     Active Hospital Problems    Diagnosis    Mastoiditis    Headache     82 y.o. 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 And Nonspecific fluid in right mastoid air cells and there was concern for mastoiditis and she was started on IV abx.        Of note:hx of orthostatic hypotension and takes midodrine if SBP>120,also states about 5 weeks ago she was started on 0.1 florinef on 05/14/2022      Headache new since 3-4 weeks ago,could be med effect (florinef and also due to accelerated HTN  H/o orthostatic hypotension on Midodrine and Florinef   -discussed with neurologist,input appreciated  -MRI with no new infarce, remote infarct noted, and sinusitis noted   -MRA head and neck negative  -MRV negative for sinus venous thrombosis   -patient did not tolerate  Robaxin 500 mg BID, discontinued  -fall precaution  - Continue to complain of headache  - Blood pressure stable however positive for orthostasis        Left ear pain and reduced hearing ,mastoiditis is ruled out based on  exam and MRI finding  Sphenoid and maxillary sinusitis noted on MRI brain  Right ear Cerumen   -ENT consulted and input appreciated  -afebrile,no leucocytosis, no pain at right mastoid, but pain on left side of neck,  -stopped IV abx which had ordered on admission on 2/23  -needs audiogram in office after discharge per ENT and cerumen debridement in office  - Continue to be afebrile     Essential Hypertension with accelerated HTN on admission 236/96  H/o orthostatic hypotension  H/o Hyperlipidemia  CAD s/p CABG (2002), stents (2006)  Bilateral carotid artery stenosis  -BP still uncontrolled with ongoing headaches   -Per patient, her BP has been elevated intermittently for the past 3-4 weeks  -BP elevated on admission, improved after IV hydralazine x1, continue to monitor  -01/2020 PET myocardial perfusion: small area of apical wall ischemia  -05/2020 carotid A dopplers: bilateral 50-69% stenosis of ICAs  -11/2020 echo: EF 65-70%, LVH, trace aortic regurgitation  -increase valsartan from 40 mg to 80 mg daily   -check UA given new sudden rise in BP, to ensure no secondary causes including nephrotic/nephritic syndrome   -monitor BMP  -Continue home ASA and statin  -Followed by outpatient cardiologist [facility])        RLQ abdominal pain on admission,denied pain for me,but mentions Right groin pain/RLE pain which is chronic  -Afebrile, no leukocytosis  -05/31/2022 CT A/P: large hiatal hernia, colonic diverticulosis,  stable left renal cysts  -Denies any diarrhea, hematemesis, hematochezia  -Mild N/V present secondary to recent administration of contrast dye,resolved  -Resolved        Chronic macrocytic anemia  -H/H stable on admission, no signs of active bleeding on exam, monitor CBC  -Vitamin B12 673 and iron 43 and iron sat 21  - Hemoglobin continues to be stable        T2DM,diet controlled   -12/2020 A1C 5.4%  -Not on any outpatient medical management currently        CKD stage 3A  -Cr 1.1, appears to be baseline  per chart review, monitor BMP  -Avoid NSAIDs/nephrotoxins, renally dose medications  -Heparin prophylaxis dose adjusted for renal function   -cr 1.1 now      GERD  -Continue home pantoprazole and PRN Tums     Chronic constipation  -Continue home MiraLAX     Seasonal allergic rhinitis  -Continue Zyrtec (substitute for home Allegra), Flonase     Anxiety/depression  -Continue home venlafaxine     Osteoarthritis  S/p right TKA  -Continue home PRN lidocaine patches      DVT Prophylaxis:  Medication VTE Prophylaxis Orders: heparin (porcine) injection 5,000 Units  Mechanical VTE Prophylaxis Orders: Maintain sequential compression device            Case was discussed with Pt ,RN      Disposition:     Today's date: 06/27/2022  Length of Stay: 1  Anticipated medical stability for discharge: 1 day   Reason for ongoing hospitalization: Orthostasis, headache  Lines:     Patient Lines/Drains/Airways Status       Active PICC Line / CVC Line / PIV Line / Drain / Airway / Intraosseous Line / Epidural Line / ART Line / Line / Wound / Pressure Ulcer / NG/OG Tube       Name Placement date Placement time Site Days    Peripheral IV 06/23/22 20 G Standard Anterior;Distal;Left Upper Arm 06/23/22  1945  Upper Arm  2    External Urinary Catheter 01/22/21  0030  --  520                     Subjective/ROS/24 hr events:   CC: Mastoiditis  Patient was seen and examined at bedside.  No acute events overnight.  Discussed with RN overnight events and plan of care.    "Still having headache every time I try to move around."    I called patient's daughter who requested to call her back later as she is busy right now.      Review of Systems:   Review of Systems - Negative except as above in HPI    Physical Exam:     Temp:  [97.7 F (36.5 C)-98.8 F (37.1 C)] 97.7 F (36.5 C)  Heart Rate:  [68-83] 74  Resp Rate:  [16-18] 18  BP: (107-184)/(68-78) 120/69  Intake and Output Summary-last 24 Hrs:  I/O last 3 completed shifts:  In: -   Out: 2600  [Urine:2600]  No data recorded by O2 Device: None (Room air)       General: WD female  in no acute distress,  Cardiovascular: regular rate and rhythm, no murmurs  Lungs: clear to auscultation bilaterally, no additional sounds  Abdomen: soft, non-tender, non-distended; normoactive bowel sounds  Extremities: No edema lower extremities  Neurological: Alert and oriented X 3, moves all extremities, sensations are intact, motor 5/5.  Pupils are reactive bilaterally, no  nystagmus.  Skin:no rash or lesion         Meds:   Medications were reviewed:  Scheduled Meds:  Current Facility-Administered Medications   Medication Dose Route Frequency    acetaminophen  1,000 mg Oral Q8H    aspirin EC  81 mg Oral Daily    atorvastatin  10 mg Oral Daily    cetirizine  10 mg Oral Daily    fluticasone  2 spray Each Nare Daily    heparin (porcine)  5,000 Units Subcutaneous Q12H [place]    magnesium sulfate  1 g Intravenous Daily    pantoprazole  40 mg Oral QAM AC    polyethylene glycol  17 g Oral Daily    ranolazine  500 mg Oral BID    valsartan  80 mg Oral Daily    venlafaxine  75 mg Oral QHS    vitamin D (ergocalciferol)  50,000 Units Oral Weekly    vitamins/minerals  1 tablet Oral Daily     Continuous Infusions:  PRN Meds:.acetaminophen, albuterol sulfate HFA, benzocaine-menthol, benzonatate, calcium carbonate, carboxymethylcellulose sodium, dextrose **OR** dextrose **OR** dextrose **OR** glucagon (rDNA), hydrALAZINE, lidocaine, magnesium sulfate, melatonin, naloxone, ondansetron **OR** ondansetron, saline  Labs/Radiology:   Imaging personally reviewed, including: all available   No results found.  No results for input(s): "GLUCOSEWB" in the last 24 hours.  Recent Labs   Lab 06/27/22  0643 06/26/22  0556 06/25/22  0636 06/24/22  0620   Sodium 140 140 141 139   Potassium 4.6 4.5 4.6 4.6   Chloride 106 108 109 107   BUN 17.0 15.0 17.0 17.0   Creatinine 1.1* 1.1* 1.1* 1.0   eGFR 49.5* 49.5* 49.5* 55.5*   Glucose 136* 110* 116* 149*   Calcium  9.8 9.3 10.0 9.5       Recent Labs   Lab 06/27/22  0643 06/26/22  0556 06/25/22  0636 06/24/22  0620   WBC 6.81 5.99 5.87 6.98   Hgb 11.4 10.6* 11.8 9.5*   Hematocrit 34.7 32.1* 35.8 28.9*   Platelets 200 171 168 177           Recent Labs   Lab 06/23/22  1841   Alkaline Phosphatase 85   Bilirubin, Total 0.3   ALT 20   AST (SGOT) 24       Recent Labs   Lab 06/23/22  1841   C-Reactive Protein 0.6   Procalcitonin 0.04           Signed by: Christell Faith, MD

## 2022-06-27 NOTE — Plan of Care (Signed)
No maintenance IVF necessary at this time per Christell Faith, MD . Patient notified.

## 2022-06-28 LAB — CBC AND DIFFERENTIAL
Absolute NRBC: 0 10*3/uL (ref 0.00–0.00)
Basophils Absolute Automated: 0.04 10*3/uL (ref 0.00–0.08)
Basophils Automated: 0.7 %
Eosinophils Absolute Automated: 0.56 10*3/uL — ABNORMAL HIGH (ref 0.00–0.44)
Eosinophils Automated: 9.4 %
Hematocrit: 31.8 % — ABNORMAL LOW (ref 34.7–43.7)
Hgb: 10.4 g/dL — ABNORMAL LOW (ref 11.4–14.8)
Immature Granulocytes Absolute: 0.01 10*3/uL (ref 0.00–0.07)
Immature Granulocytes: 0.2 %
Instrument Absolute Neutrophil Count: 3.2 10*3/uL (ref 1.10–6.33)
Lymphocytes Absolute Automated: 1.57 10*3/uL (ref 0.42–3.22)
Lymphocytes Automated: 26.3 %
MCH: 32.9 pg (ref 25.1–33.5)
MCHC: 32.7 g/dL (ref 31.5–35.8)
MCV: 100.6 fL — ABNORMAL HIGH (ref 78.0–96.0)
MPV: 10.8 fL (ref 8.9–12.5)
Monocytes Absolute Automated: 0.59 10*3/uL (ref 0.21–0.85)
Monocytes: 9.9 %
Neutrophils Absolute: 3.2 10*3/uL (ref 1.10–6.33)
Neutrophils: 53.5 %
Nucleated RBC: 0 /100 WBC (ref 0.0–0.0)
Platelets: 194 10*3/uL (ref 142–346)
RBC: 3.16 10*6/uL — ABNORMAL LOW (ref 3.90–5.10)
RDW: 13 % (ref 11–15)
WBC: 5.97 10*3/uL (ref 3.10–9.50)

## 2022-06-28 LAB — BASIC METABOLIC PANEL
Anion Gap: 8 (ref 5.0–15.0)
BUN: 19 mg/dL (ref 7.0–21.0)
CO2: 27 mEq/L (ref 17–29)
Calcium: 9.8 mg/dL (ref 7.9–10.2)
Chloride: 105 mEq/L (ref 99–111)
Creatinine: 1.1 mg/dL — ABNORMAL HIGH (ref 0.4–1.0)
Glucose: 130 mg/dL — ABNORMAL HIGH (ref 70–100)
Potassium: 4.5 mEq/L (ref 3.5–5.3)
Sodium: 140 mEq/L (ref 135–145)
eGFR: 49.5 mL/min/{1.73_m2} — AB (ref >=60)

## 2022-06-28 MED ORDER — PYRIDOSTIGMINE BROMIDE 60 MG PO TABS
30.0000 mg | ORAL_TABLET | Freq: Three times a day (TID) | ORAL | Status: DC
Start: 2022-06-28 — End: 2022-06-29
  Administered 2022-06-28 – 2022-06-29 (×3): 30 mg via ORAL
  Filled 2022-06-28 (×5): qty 1

## 2022-06-28 NOTE — Progress Notes (Signed)
Epic was down from 7:30pm until 3am; [place] at the pt's rooms are not connected to the [employer]. Medications were given and recorded in the downtime medication record, signed accordingly; Rights of Medication Administration observed.

## 2022-06-28 NOTE — Plan of Care (Signed)
Problem: Moderate/High Fall Risk Score >5  Goal: Patient will remain free of falls  Flowsheets  Taken 06/28/2022 0435  VH High Risk (Greater than 13):   ALL REQUIRED LOW INTERVENTIONS   ALL REQUIRED MODERATE INTERVENTIONS   RED "HIGH FALL RISK" SIGNAGE   BED ALARM WILL BE ACTIVATED WHEN THE PATEINT IS IN BED WITH SIGNAGE "RESET BED ALARM"   A CHAIR PAD ALARM WILL BE USED WHEN PATIENT IS UP SITTING IN A CHAIR   PATIENT IS TO BE SUPERVISED FOR ALL TOILETING ACTIVITIES   Include family/significant other in multidisciplinary discussion regarding plan of care as appropriate   Request PT/OT therapy consult order from physician for patients with gait/mobility impairment   Use assistive devices   Video monitoring   Use of roll guard  Taken 06/27/2022 1936  High (Greater than 13):   HIGH-Visual cue at entrance to patient's room   HIGH-Bed alarm on at all times while patient in bed   HIGH-Utilize chair pad alarm for patient while in the chair   HIGH-Apply yellow "Fall Risk" arm band   HIGH-Pharmacy to initiate evaluation and intervention per protocol   HIGH-Consider use of low bed     Problem: Safety  Goal: Patient will be free from injury during hospitalization  Flowsheets (Taken 06/28/2022 0435)  Patient will be free from injury during hospitalization:   Assess patient's risk for falls and implement fall prevention plan of care per policy   Provide and maintain safe environment   Use appropriate transfer methods   Ensure appropriate safety devices are available at the bedside   Include patient/ family/ care giver in decisions related to safety   [place] rounding   Assess for patients risk for elopement and implement Elopement Risk Plan per policy   Provide alternative method of communication if needed (communication boards, writing)  Goal: Patient will be free from infection during hospitalization  Outcome: Progressing  Flowsheets (Taken 06/28/2022 0435)  Free from Infection during hospitalization:   Assess and monitor for  signs and symptoms of infection   Monitor lab/diagnostic results   Monitor all insertion sites (i.e. indwelling lines, tubes, urinary catheters, and drains)   Encourage patient and family to use good hand hygiene technique

## 2022-06-28 NOTE — Progress Notes (Signed)
Date Time: 06/28/22 4:40 PM  Patient Name: Vanessa Kelly  Attending Physician: Danise Edge, MD  Patient Class: Inpatient  Hospital Day: 2            NEUROLOGY PROGRESS NOTE             Assessment/Plan   Headaches, with some migraine features, ongoing for the last 3 to 4 days, fluctuating, in intensity, and intermittent headaches, Seems more likely that these are related to blood pressure changes and fluctuations---better   Severe orthostatic hypotensionStill ongoing, blood pressure in the 70s  Mild postural tremor  Recent Labs   Lab 06/24/22  0620   Hemoglobin A1C 5.4       Recent Labs   Lab 06/24/22  0620   Cholesterol 163   Triglycerides 39   HDL 74   LDL Calculated 81       Recent Labs   Lab 06/24/22  0620   LDL Calculated 81      Blood pressure still fluctuating significantly, Lowest blood pressure reading 101/64    Continue to monitor orthostasis, will advise accordingly Mestinon added '30mg'$   3 times daily discussed with patient  Will stop IV magnesium  Will add Mestinon      Subjective:   Patient Seen and Examined. Headaches are markedly better blood pressure also better, patient only on Diovan 80 mg now    Review of Systems:   No headache, eye, ear nose, throat problems; no coughing or wheezing or shortness of breath,   No chest pain or orthopnea, no abdominal pain, nausea or vomiting,   No pain in the body or extremities, no psychiatric, neurological, endocrine, hematological   or cardiac complaints except as noted above.          Physical Exam:   BP 147/67   Pulse 66   Temp 97.9 F (36.6 C) (Oral)   Resp 17   Ht 1.702 m ('5\' 7"'$ )   Wt 84.8 kg (186 lb 15.2 oz)   SpO2 98%   BMI 29.28 kg/m     Neuro:  Level of consciousness:  Lying in bed looks much more awake and alert, looks quite comfortable face slight asymmetry other than that no other neuro's deficits  Meds:      Scheduled Meds: PRN Meds:    acetaminophen, 1,000 mg, Oral, Q8H  aspirin EC, 81 mg, Oral, Daily  atorvastatin, 10 mg, Oral,  Daily  cetirizine, 10 mg, Oral, Daily  fluticasone, 2 spray, Each Nare, Daily  heparin (porcine), 5,000 Units, Subcutaneous, Q12H SCH  pantoprazole, 40 mg, Oral, QAM AC  polyethylene glycol, 17 g, Oral, Daily  pyridostigmine, 30 mg, Oral, Q8H SCH  ranolazine, 500 mg, Oral, BID  valsartan, 80 mg, Oral, Daily  venlafaxine, 75 mg, Oral, QHS  vitamin D (ergocalciferol), 50,000 Units, Oral, Weekly  vitamins/minerals, 1 tablet, Oral, Daily        Continuous Infusions:   acetaminophen, 650 mg, Q4H PRN  albuterol sulfate HFA, 1 puff, Q4H PRN  benzocaine-menthol, 1 lozenge, Q2H PRN  benzonatate, 100 mg, TID PRN  calcium carbonate, 1 tablet, Q6H PRN  carboxymethylcellulose sodium, 1 drop, TID PRN  dextrose, 15 g of glucose, PRN   Or  dextrose, 12.5 g, PRN   Or  dextrose, 12.5 g, PRN   Or  glucagon (rDNA), 1 mg, PRN  hydrALAZINE, 10 mg, Q6H PRN  lidocaine, 1 patch, Q24H PRN  magnesium sulfate, 1 g, PRN  melatonin, 3 mg, QHS PRN  naloxone, 0.2 mg,  PRN  ondansetron, 4 mg, Q6H PRN   Or  ondansetron, 4 mg, Q6H PRN  saline, 2 spray, Q4H PRN                Labs:     Recent Labs   Lab 06/28/22  0618 06/27/22  0643 06/26/22  0556 06/24/22  0620 06/23/22  1841   Glucose 130* 136* 110*  More results in Results Review 103*   BUN 19.0 17.0 15.0  More results in Results Review 18.0   Creatinine 1.1* 1.1* 1.1*  More results in Results Review 1.1*   Calcium 9.8 9.8 9.3  More results in Results Review 10.3*   Sodium 140 140 140  More results in Results Review 140   Potassium 4.5 4.6 4.5  More results in Results Review 4.5   Chloride 105 106 108  More results in Results Review 107   CO2 '27 26 25  '$ More results in Results Review 23   Albumin  --   --   --   --  3.7   AST (SGOT)  --   --   --   --  24   ALT  --   --   --   --  20   Bilirubin, Total  --   --   --   --  0.3   Alkaline Phosphatase  --   --   --   --  85   More results in Results Review = values in this interval not displayed.       Recent Labs   Lab 06/28/22  0618 06/27/22  0643  06/26/22  0556   WBC 5.97 6.81 5.99   Hgb 10.4* 11.4 10.6*   Hematocrit 31.8* 34.7 32.1*   MCV 100.6* 100.3* 100.6*   MCH 32.9 32.9 33.2   MCHC 32.7 32.9 33.0   Platelets 194 200 171                   Lab Results   Component Value Date    TSH 1.49 06/24/2022     Recent Labs   Lab 06/23/22  1841   C-Reactive Protein 0.6        Recent Labs   Lab 06/23/22  1841   Sed Rate 40*        .ll    No results for input(s): "PTT", "PT", "INR" in the last 72 hours.       Radiology Results (24 Hour)       ** No results found for the last 24 hours. **             All brain imaging (MRI, CT) personally reviewed.    Case discussed with: pt               I personally reviewed all of the medications.  Medication list generated using all available resources.  Elder abuse (physical)  - negative  Advanced care plan - reviewed from chart or in discussion with pt or family        This note was generated by the Epic EMR system/ Dragon speech recognition and may contain inherent errors or omissions not intended by the user. Grammatical errors, random word insertions, deletions and pronoun errors  are occasional consequences of this technology due to software limitations.   Not all errors are caught or corrected. If there are questions or concerns about the content of this note or information contained within the body of this  dictation they should be addressed directly with the author for clarification.      Signed by: Dorothea Glassman, MD, MD  [place]:[id number]      Answering Service: [phone]

## 2022-06-28 NOTE — PT Progress Note (Signed)
Physical Therapy Treatment  [name]  Post Acute Care Therapy Recommendations:     Discharge Recommendations:  ALF    If ALF  recommended discharge disposition is not available, patient will need CG assist for ambulation and SNF.     DME needs IF patient is discharging home: No additional equipment/DME recommended at this time, Patient already has needed equipment    Therapy discharge recommendations may change with patient status.  Please refer to most recent note for up-to-date recommendations.           [place] [id number]  [phone]    Physical Therapy Treatment    Patient:  Vanessa Kelly        MRN#:  [id number]  Unit:  [facility]        Room/Bed:  [id number]    Medical Diagnosis: Headache [R51.9]    Time of treatment:  PT Received On: 06/28/22  Start Time: 1022   Stop Time: 1102  Time Calculation (min): 40 min       Treatment #: PT Visit Number: 2/5    Patient's medical condition is appropriate for Physical Therapy intervention at this time.    Assessment   Pt ready for therapy, pleasant and agreeable.   Vitals assessed throughout session and, despite abdominal binder and compression stockings on, pt still experienced orthostatic hypotension with positional changes - see below for details.   Pt is symptomatic with BP drops, so mobility parameters led with pt's tolerance.   Pt completes AROM while seated EOB - see below for details.  Pt continues with ambulation, 10' x2 and 20' with seated rest breaks, to and from toilet.   Pt unable to void bowel or bladder.  Pt returns supine and left with all needs in reach, and in semi-chair position in bed.     Mrs. Sharbaugh continues to experience drops in blood pressure with positional changes. She IS able to complete bed mobility and sit to stands with no hands on assist. She may benefit from additional compression (ace wraps) to BLE for improved blood pressure stasis.    PMP - Progressive  Mobility Protocol   PMP Activity: Step 6 - Walks in Room  Distance Walked (ft) (Step 6,7): 20 Feet (x2 to/from bathroom with ABD binder and compression socks donned.)     Plan     Continue plan of care.    Interdisciplinary Communication: RN.      Subjective   Pt ready for therapy.   Patient is agreeable to participation in the therapy session. Nursing clears patient for therapy.  Pain: pt denies pain    Vitals:    06/28/22 1029 06/28/22 1036 06/28/22 1038   Vital Signs   Heart Rate 76 79 77   BP 171/72 146/74 101/64   BP Location Right arm Right arm Right arm   MAP (mmHg) 105 98 77   Patient Position Lying Sitting Standing      06/28/22 1048   Vital Signs   Heart Rate 81   BP 138/74   BP Location Right arm   MAP (mmHg) 95   Patient Position Sitting       Objective     Precautions/ Contraindications:   Precautions  Weight Bearing Status: no restrictions  Other Precautions: fall risk, orthostatic hypotension    Patient is in bed with telemetry, PrimaFit (external female catheter), and intravenous (IV) in place.      Therapeutic  exercises: Seated, x10  Ankle Pumps:   Long Arc Quad:   Seated Marches:   Forward Punches:    Functional Mobility:  Rolling: SUP  Supine to Sit: SUP  Scooting: SUP  Sit to Supine: SUP  Sit to Stand: SBA to MIN-A (from low toilet seat)  Stand to Sit: SBA to MIN-A    Gait:   WB status: FWB  Assistive Device: FWW  Assist Level: CGA  Distance: 20', 10' x2  Pattern: Forward posture; dec step height and length; cues to keep B feet inside walker       Educated the patient to role of physical therapy, plan of care, goals  of therapy, rationale for progressing mobility and safety with mobility and ADLs and use of abdominal binder .    RN notified of session outcome and that patient was left in bed in partial chair position, with all needs met and equipment intact.   Safety measures include: handoff to nurse/clin tech/ unit secretary completed, bed alarm activated, oriented to call bell and placed  within reach, personal items within reach, and bed placed in lowest position.   Mobility and ADL status posted at bedside and within E.M.R.    Goals per Eval/ Re-eval:   Goals  Goal Formulation: With patient  Time for Goal Acheivement: 5 visits  Goals: Select goal  Pt Will Perform Sit to Stand: modified independent  Pt Will Ambulate: 151-200 feet, with rolling walker, modified independent        Therapist PPE during session gloves     Signature:  Damien Fusi, LPTA  06/28/2022 1:17 PM        (For scheduling questions, please contact rehab tech x [phone])

## 2022-06-28 NOTE — Nursing Progress Note (Signed)
Orthostatic Blood Pressure:        lying  128/71     sitting  165/65    standing  94/62

## 2022-06-28 NOTE — Progress Notes (Signed)
Orthostatic hypotension check     06/28/22 1804 06/28/22 1807 06/28/22 1813   Vital Signs   Heart Rate 69 76 87   BP 151/73 165/75 111/62   MAP (mmHg) 99 105 78

## 2022-06-28 NOTE — Plan of Care (Signed)
Problem: Hemodynamic Status: Cardiac  Goal: Stable vital signs and fluid balance  Outcome: Progressing  Flowsheets (Taken 06/28/2022 1329)  Stable vital signs and fluid balance:   Assess signs and symptoms associated with cardiac rhythm changes   Monitor lab values     Problem: Compromised Sensory Perception  Goal: Sensory Perception Interventions  Outcome: Progressing  Flowsheets (Taken 06/28/2022 1329)  Sensory Perception Interventions: Offload heels, Pad bony prominences, Reposition q 2hrs/turn Clock, Q2 hour skin assessment under devices if present

## 2022-06-28 NOTE — Plan of Care (Signed)
Notified by RN pt c/o dysuria  Ordered UA      Lavell Islam, NP

## 2022-06-28 NOTE — Progress Notes (Signed)
Hospitalist progress note          Date Time: 06/28/22 3:31 PM  Patient Name: Vanessa Kelly  Attending Physician: Danise Edge, MD  Primary Care Physician: Cherie Ouch, DO    Subjective:  Vitals and labs reviewed.  Continues to have lower blood pressures.  Up on examination in the morning, pt appears to be in no acute distress. No reported over night events.  Still complaining of headache though improved from the time of admission.    Review of Systems:   All other systems were reviewed and are negative except:as above in HPI     Physical Exam:   Patient Vitals for the past 24 hrs:   BP Temp Temp src Pulse Resp SpO2   06/28/22 1131 125/70 98.2 F (36.8 C) Oral 74 20 97 %   06/28/22 1048 138/74 -- -- 81 -- --   06/28/22 1038 101/64 -- -- 77 -- 97 %   06/28/22 1036 146/74 -- -- 79 -- 97 %   06/28/22 1029 171/72 -- -- 76 -- --   06/28/22 0827 152/75 -- -- 79 -- 99 %   06/28/22 0824 152/75 -- -- -- -- --   06/28/22 0748 197/79 98.2 F (36.8 C) Oral 75 20 99 %   06/28/22 0513 149/75 97.5 F (36.4 C) Oral 75 18 97 %   06/27/22 2336 145/71 98.4 F (36.9 C) Oral 76 18 97 %   06/27/22 2217 94/62 -- -- 91 -- --   06/27/22 2213 165/65 -- -- 84 -- --   06/27/22 2206 128/71 -- -- 87 -- --   06/27/22 1936 162/71 97.9 F (36.6 C) Oral 75 18 97 %   06/27/22 1800 -- -- -- 77 -- --   06/27/22 1700 -- -- -- 71 -- --   06/27/22 1619 147/56 98.1 F (36.7 C) Oral 75 18 100 %   06/27/22 1600 -- -- -- 73 -- --     Body mass index is 29.28 kg/m.    Intake/Output Summary (Last 24 hours) at 06/28/2022 1531  Last data filed at 06/28/2022 0800  Gross per 24 hour   Intake 825 ml   Output 2050 ml   Net -1225 ml       Gen: NAD  Eyes: Pupils are reactive to light  Throat: Moist mucus membranes  Neck: supple  Chest: S1 S2 +  Resp: CTA b/l  Abd: Soft, non tender.   Neuro: Awake, alert. Appropriately conversive, following simple commands.?  Extremities: No pedal edema    Assessment & Plan     82 y.o. female with a PMHx of HTN, CAD  s/p CABG (2002) and stents (2006), bilateral carotid artery stenosis, HLD, T2DM, CKD stage 3a, GERD, OA s/p right TKA, seasonal allergic rhinitis, and anxiety/depression and orthostatic hypotension on midodrine and on 05/14/2022 was started on Florinef 0.1 mg daily who presented to ED for worsening HA x 3-4 weeks associated with elevated BP, blurry vision/diplopia, and left ear fullness/pressure and reduced hearing. 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 And Nonspecific fluid in right mastoid air cells and there was concern for mastoiditis and she was started on IV abx.     Of note:hx of orthostatic hypotension and takes midodrine if SBP>120,also states about 5 weeks  ago she was started on 0.1 florinef on 05/14/2022      Headache: Reason for current admission.  H/o orthostatic hypotension on Midodrine and Florinef   -discussed with neurologist,input appreciated  -MRI with no new infarce, remote infarct noted, and sinusitis noted   -MRA head and neck negative  -MRV negative for sinus venous thrombosis   -patient did not tolerate  Robaxin 500 mg BID, discontinued  -fall precaution  -Patient mentioned that, symptoms started after initiation of fludrocortisone and she does not want to take it.  -Continue as needed midodrine.  Will try abdominal binder and lower extremity compression stockings for orthostatic hypotension.        Left ear pain and reduced hearing ,mastoiditis is ruled out based on exam and MRI finding  Sphenoid and maxillary sinusitis noted on MRI brain  Right ear Cerumen   -ENT consulted and input appreciated  -afebrile,no leucocytosis, no pain at right mastoid, but pain on left side of neck,  -stopped IV abx which had ordered on admission on 2/23  -needs audiogram in office after  discharge per ENT and cerumen debridement in office  - Continue to be afebrile     CAD s/p CABG (2002), stents (2006)  Bilateral carotid artery stenosis  -increased valsartan from 40 mg to 80 mg daily   -monitor BMP  -Continue home ASA and statin  -Followed by outpatient cardiologist [facility])     RLQ abdominal pain:chronic  -Afebrile, no leukocytosis  -05/31/2022 CT A/P: large hiatal hernia, colonic diverticulosis, stable left renal cysts  -Denies any diarrhea, hematemesis, hematochezia  -Resolved     Chronic macrocytic anemia  -H/H stable on admission, no signs of active bleeding on exam, monitor CBC  -Continue to monitor.     T2DM,diet controlled   -12/2020 A1C 5.4%  -Not on any outpatient medical management currently     CKD stage 3A  -Cr 1.1, appears to be baseline per chart review, monitor BMP  -Avoid NSAIDs/nephrotoxins, renally dose medications  -Heparin prophylaxis dose adjusted for renal function   -cr 1.1 now      GERD  -Continue home pantoprazole and PRN Tums     Chronic constipation  -Continue home MiraLAX     Seasonal allergic rhinitis  -Continue Zyrtec (substitute for home Allegra), Flonase     Anxiety/depression  -Continue home venlafaxine     Osteoarthritis  S/p right TKA  -Continue home PRN lidocaine patches    Reason for continued hospitalization: Continued headache and orthostatic hypotension.  Disposition: Possible discharge tomorrow reviewed.  Remained stable.    Plan of care has been discussed with the patient in detail.  Verbalized understanding.  In agreement with the plan  Plan of care has been discussed with RN       Medications:   Scheduled Meds:  Current Facility-Administered Medications   Medication Dose Route Frequency    acetaminophen  1,000 mg Oral Q8H    aspirin EC  81 mg Oral Daily    atorvastatin  10 mg Oral Daily    cetirizine  10 mg Oral Daily    fluticasone  2 spray Each Nare Daily    heparin (porcine)  5,000 Units Subcutaneous Q12H SCH    pantoprazole  40 mg Oral QAM AC     polyethylene glycol  17 g Oral Daily    pyridostigmine  30 mg Oral Q8H SCH    ranolazine  500 mg Oral BID    valsartan  80 mg Oral Daily  venlafaxine  75 mg Oral QHS    vitamin D (ergocalciferol)  50,000 Units Oral Weekly    vitamins/minerals  1 tablet Oral Daily     Continuous Infusions:  PRN Meds:.acetaminophen, albuterol sulfate HFA, benzocaine-menthol, benzonatate, calcium carbonate, carboxymethylcellulose sodium, dextrose **OR** dextrose **OR** dextrose **OR** glucagon (rDNA), hydrALAZINE, lidocaine, magnesium sulfate, melatonin, naloxone, ondansetron **OR** ondansetron, saline       Labs:     Results       Procedure Component Value Units Date/Time    Culture Blood Aerobic and Anaerobic [id number] Collected: 06/24/22 0620    Specimen: Blood, Venipuncture Updated: 06/28/22 1027    Narrative:      The order will result in two separate 8-54m bottles  Please do NOT order repeat blood cultures if one has been  drawn within the last 48 hours  UNLESS concerned for  endocarditis  AVOID BLOOD CULTURE DRAWS FROM CENTRAL LINE IF POSSIBLE  Indications:->Bacteremia  ORDER#: [id number]                                   ORDERED BY: KHAN, SIDRA  SOURCE: Blood, Venipuncture                          COLLECTED:  06/24/22 06:20  ANTIBIOTICS AT COLL.:                                RECEIVED :  06/24/22 09:35  Culture Blood Aerobic and Anaerobic        PRELIM      06/28/22 10:27  06/25/22   No Growth after 1 day/s of incubation.  06/26/22   No Growth after 2 day/s of incubation.  06/27/22   No Growth after 3 day/s of incubation.  06/28/22   No Growth after 4 day/s of incubation.      Culture Blood Aerobic and Anaerobic [[id number]: 06/24/22 0629    Specimen: Blood, Venipuncture Updated: 06/28/22 1027    Narrative:      The order will result in two separate 8-167mbottles  Please do NOT order repeat blood cultures if one has been  drawn within the last 48 hours  UNLESS concerned for  endocarditis  AVOID BLOOD CULTURE  DRAWS FROM CENTRAL LINE IF POSSIBLE  Indications:->Bacteremia  ORDER#: [id number]                                  ORDERED BY: KHAN, SIJimmey RalphSOURCE: Blood, Venipuncture                          COLLECTED:  06/24/22 06:29  ANTIBIOTICS AT COLL.:                                RECEIVED :  06/24/22 09:35  Culture Blood Aerobic and Anaerobic        PRELIM      06/28/22 10:27  06/25/22   No Growth after 1 day/s of incubation.  06/26/22   No Growth after 2 day/s of incubation.  06/27/22   No Growth after 3 day/s of incubation.  06/28/22   No Growth after 4 day/s of incubation.  Basic Metabolic Panel [id number]  (Abnormal) Collected: 06/28/22 0618    Specimen: Blood Updated: 06/28/22 0742     Glucose 130 mg/dL      BUN 19.0 mg/dL      Creatinine 1.1 mg/dL      Calcium 9.8 mg/dL      Sodium 140 mEq/L      Potassium 4.5 mEq/L      Chloride 105 mEq/L      CO2 27 mEq/L      Anion Gap 8.0     eGFR 49.5 mL/min/1.73 m2     CBC and differential [id number]  (Abnormal) Collected: 06/28/22 0618    Specimen: Blood Updated: 06/28/22 0728     WBC 5.97 x10 3/uL      Hgb 10.4 g/dL      Hematocrit 31.8 %      Platelets 194 x10 3/uL      RBC 3.16 x10 6/uL      MCV 100.6 fL      MCH 32.9 pg      MCHC 32.7 g/dL      RDW 13 %      MPV 10.8 fL      Instrument Absolute Neutrophil Count 3.20 x10 3/uL      Neutrophils 53.5 %      Lymphocytes Automated 26.3 %      Monocytes 9.9 %      Eosinophils Automated 9.4 %      Basophils Automated 0.7 %      Immature Granulocytes 0.2 %      Nucleated RBC 0.0 /100 WBC      Neutrophils Absolute 3.20 x10 3/uL      Lymphocytes Absolute Automated 1.57 x10 3/uL      Monocytes Absolute Automated 0.59 x10 3/uL      Eosinophils Absolute Automated 0.56 x10 3/uL      Basophils Absolute Automated 0.04 x10 3/uL      Immature Granulocytes Absolute 0.01 x10 3/uL      Absolute NRBC 0.00 x10 3/uL               This note was generated by the Epic EMR system/ Dragon speech recognition and may contain inherent errors or  omissions not intended by the user. Grammatical errors, random word insertions, deletions and pronoun errors  are occasional consequences of this technology due to software limitations. Not all errors are caught or corrected. If there are questions or concerns about the content of this note or information contained within the body of this dictation they should be addressed directly with the author for clarification.    Signed by: Danise Edge, MD, MD   [id number], Amr H, DO

## 2022-06-29 LAB — URINALYSIS WITH REFLEX TO MICROSCOPIC EXAM - REFLEX TO CULTURE
Bilirubin, UA: NEGATIVE
Blood, UA: NEGATIVE
Glucose, UA: NEGATIVE
Ketones UA: NEGATIVE
Leukocyte Esterase, UA: NEGATIVE
Nitrite, UA: NEGATIVE
Protein, UR: NEGATIVE
Specific Gravity UA: 1.006 (ref 1.001–1.035)
Urine pH: 6.5 (ref 5.0–8.0)
Urobilinogen, UA: NORMAL mg/dL (ref 0.2–2.0)

## 2022-06-29 MED ORDER — BISACODYL 5 MG PO TBEC
10.0000 mg | DELAYED_RELEASE_TABLET | Freq: Once | ORAL | Status: AC
Start: 2022-06-29 — End: 2022-06-29
  Administered 2022-06-29: 10 mg via ORAL
  Filled 2022-06-29: qty 2

## 2022-06-29 MED ORDER — SENNOSIDES-DOCUSATE SODIUM 8.6-50 MG PO TABS
1.0000 | ORAL_TABLET | Freq: Every evening | ORAL | Status: DC
Start: 2022-06-29 — End: 2022-06-29

## 2022-06-29 MED ORDER — MIDODRINE HCL 5 MG PO TABS
5.0000 mg | ORAL_TABLET | Freq: Three times a day (TID) | ORAL | Status: AC
Start: 2022-06-29 — End: ?

## 2022-06-29 NOTE — Discharge Instr - AVS First Page (Addendum)
Reason for your Hospital Admission:  Headache/orthostatic hypotension.      Instructions for after your discharge:  Please follow-up with your primary care physician within a week of discharge.      Home Health Discharge Information     Your doctor has ordered Skilled Nursing, Physical Therapy, Occupational Therapy, and Home Health Aide in-home service(s) for you while you recuperate at home, to assist you in the transition from hospital to home.     The agency that you or your representative chose to provide the service:     [facility] [phone]     The above services were set up by:  Conley Rolls, RN [facility] Liaison)   Phone: [phone]     IF YOU HAVE NOT HEARD [facility] 24-48 HOURS AFTER DISCHARGE PLEASE CALL YOUR AGENCY TO ARRANGE A TIME FOR YOUR FIRST VISIT. FOR ANY SCHEDULING CONCERNS OR QUESTIONS RELATED TO HOME HEALTH, SUCH AS TIME OR DATE PLEASE CONTACT YOUR HOME HEALTH AGENCY AT THE NUMBER LISTED ABOVE.

## 2022-06-29 NOTE — Progress Notes (Signed)
Start Vanessa Kelly Note  Home Health Referral    Referral from Vanessa Kelly (Case Manager) for home health care upon discharge.    By [employer], the patient has the right to freely choose a home care provider.    A company of the patients choosing. We have supplied the patient with a listing of providers in your area who asked to be included and participate in Medicare.   [name], formerly [place], a home care agency that provides adult home care services and participates in Medicare   The preferred provider of your insurance company. Choosing a home care provider other than your insurance company's preferred provider may affect your insurance coverage.      Home Health Discharge Information    Your doctor has ordered Skilled Nursing, Physical Therapy, Occupational Therapy, and Home Health Aide in-home service(s) for you while you recuperate at home, to assist you in the transition from hospital to home.    The agency that you or your representative chose to provide the service:    Vanessa Kelly [phone]    The above services were set up by:  Vanessa Rolls, RN [facility] Liaison)   Phone: [phone]    IF YOU HAVE NOT HEARD Vanessa Kelly 24-48 HOURS AFTER DISCHARGE PLEASE CALL YOUR AGENCY TO ARRANGE A TIME FOR YOUR FIRST VISIT. FOR ANY SCHEDULING CONCERNS OR QUESTIONS RELATED TO HOME HEALTH, SUCH AS TIME OR DATE PLEASE CONTACT YOUR HOME HEALTH AGENCY AT THE NUMBER LISTED ABOVE.    Additional comments:        START PATIENT REGISTRATION INFORMATION     Order Information  Order Signing Physician: Vanessa Edge, MD    Service Ordered RN ?: Yes  Service Ordered PT ?: Yes  Service Ordered OT ?: Yes  Service Ordered ST ?: No    Service Ordered MSW?: No    Service Ordered HHA?: No    Following Physician: Vanessa Ouch, DO  Following Physician Phone: [phone]  Overseeing Physician: N/A  (Required for Residents only)   Agreeable to Follow?: Yes  Spoke  with: N/A  Date/Time of Call: 06/29/22 2:47 PM      Care Coordination   Same Day [facility]?: no  Primary Care Physician:Vanessa Julius Bowels, DO  Primary Care Physician Phone:[phone]  Primary Care Physician Address: [address] [place] [address]  PCP NPI: [id number]  Visit Instructions: N/A  Service Discharge Location Type: [place] Name: N/A  Service Floor Facility: N/A  Service Room No: N/A    Demographics  Patient Last Name: Vanessa Kelly   Patient First Name: Vanessa Kelly  Language/Communication Barrier: No  Service Address: [address]  [place] [address]   Service Home Phone: [phone] (home)   Other phone numbers:    Telephone Information:   Mobile [phone]     Emergency Contact: Extended Emergency Contact Information  Primary Emergency Contact: Vanessa Kelly  Mobile Phone: [phone]  Relation: Daughter  Interpreter needed? No    Admission Information  Admit Date: 06/23/2022  Patient Status at discharge: Inpatient  Admitting Diagnosis: Headache [R51.9]     Caregiver Information  Caregiver First Name: N/A  Caregiver Last Name: N/A  Caregiver Relationship to Patient: N/A  Caregiver Phone Number: N/A  Caregiver Notes: N/A            Facilities manager Information  Primary Subscriber:  Primary Subscriber Relation To Guarantor:   Primary Payor:   Primary Plan:   Primary Group #:    Primary Subscriber ID:    Primary Subscriber DOB:   Secondary Insurance Information  Secondary Subscriber:   Secondary Subscriber Relation To Guarantor:   Secondary Payor:   Secondary Plan:   Secondary Group #:   Secondary Subscriber ID:   Secondary Subscriber DOB:   HITECH  NO    END PATIENT REGISTRATION INFORMATION       Diagnosis:  Headache [R51.9]    Start [facility] Summary        Additional Comments:     End PACC Summary     Discharge Date:  2.28.2024    Referral Source  Signed by: Vanessa Rolls, RN  Date Time: 06/29/22 2:47 PM      End PACC Note             [facility]     Patient Name: Vanessa Kelly, Vanessa Kelly     MRN: [id number]      CSN: [id number]         [place] #   [id number] Patient Class   Inpatient Service  Medicine Accommodation Code  Intermediate Care      Admission Information     Admitting Physician:  Attending Physician: Vanessa Hides, MD  Vanessa Edge, MD Unit  [facility] INTERMED* L&D Status      Admitting Diagnosis: Headache Room / Bed  MI628/MI628-01 L&D - Last Menstrual Cycle      Chief Complaint: Hypertension; Headache     Admit Type:  Admit Date/Time:  Discharge Date/Time: Emergency  06/23/2022 / 1803   /  Length of Stay: 3 Days    L&D EDD   Estimated Date of Delivery: None noted.      Patient Information              Home Address: [place]  [address] [address] Employer:  Employer Address:       ,     Main Phone: [phone] Employer Phone:     SSN: [id number]       DOB: [birth year] (62 yrs)       Sex: Female Primary Care Physician: Vanessa Ouch, DO   Marital Status: Married Referring Physician:       No ref. provider found   Race: Black or African American       Ethnicity: Not of Hispanic/Latino/S*       Emergency Contacts  Name Home Phone Work Phone Mobile Phone Relationship Vanessa Kelly     [phone] Daughter No         Guarantor Information     Guarantor Name: Vanessa Kelly ID: [id number]   Guarantor Relationship to Pt: Self Guarantor Type: Personal/Family   Guarantor DOB:    12/12/1940 Billing Indicator: HB coverage change to Medicare on OBS enc      Guarantor Address: [address]  [place], [place] [address]          Guarantor Home Phone: [phone] Guarantor Employer:        Guarantor Work Phone:   Special educational needs teacher Emp Phone:                     Lexicographer Name: Vanessa Kelly Name: Vanessa Kelly   Insurance Address:  [address], [place] [id number] Subscriber DOB: [birth year]       Subscriber ID: [id number]   Insurance Phone:   Pt Relationship to Sub:   Self   Insurance ID:         Group Name:   Preauthorization #: [place]   Group #:   Preauthorization Days:        Clinical research associate Name: [place] Name: [place] Address:    [address] [place], [place] [id number] Subscriber DOB: 01/11/1941      Subscriber ID: [id number]   Insurance Phone: [phone] Pt Relationship to Sub:   Self   Insurance ID:         Group Name:   Preauthorization #:     Group #:   Preauthorization Days:        [employer] Name: - Subscriber Name:     Insurance underwriter Address:       ,   Veterinary surgeon DOB:        Subscriber ID:     Veterinary surgeon:   Pt Relationship to Sub:       Insurance ID:         Group Name:   Preauthorization #:     Group #:   Preauthorization Days:           06/29/2022 2:47 PM          Home Health face-to-face (FTF) Encounter (Order [id number])  Consult  Date: 06/29/2022 Department: [facility] Ordering/Authorizing: Vanessa Edge, MD     Order Information    Order Date/Time Release Date/Time Start Date/Time End Date/Time   06/29/22 02:45 PM None 06/29/22 02:36 PM 06/29/22 02:36 PM     Order Details    Frequency Duration Priority Order Class   Once 1  occurrence Routine Hospital Performed     Standing Order Information    Remaining Occurrences Interval Last Released     0/1 Once 06/29/2022              Provider Information    Ordering User Ordering Provider Authorizing Provider   [place], Marlene Bast, RN Singasani, Laverna Peace, MD Vanessa Edge, MD   Attending Provider(s) Admitting Provider PCP   Luana Shu, MD; Vanessa Hides, MD; Vic Ripper, MD; Christell Faith, MD; Vanessa Edge, MD Vanessa Hides, MD Vanessa Ouch, DO     Verbal Order Info    Action Created on Order Mode Entered by Responsible Provider Signed by Signed on   Ordering 06/29/22 1445 Telephone with readback Vanessa Rolls, RN Singasani,  Laverna Peace, MD             Comments    Home nursing required for skilled assessment including cardiopulmonary assessment and dietary education for disease management, and medication instruction. Home PT/OT required for gait and balance training, strengthening, mobility, fall prevention, and ADL training. HHA needed for ADL care as patient transitions back to home after recent hospital inpatient stay.     Provider to follow: Vanessa Ouch, DO -- [phone]    Dx: [place] face-to-face (FTF) Encounter: Patient Communication     Not Released  Not seen         Order Questions    Question Answer   Date I saw the patient face-to-face: 06/29/2022  Evidence this patient is homebound because: E.  Requires assistance of another person or assistive device to ambulate > 5 feet    I.  Restricted to home to decrease risk of infection    L.  Transfer/ambulation requires stand by assist and verbal cues to perform safely   Medical conditions that necessitate Home Health care: C.  Risk for complication/infection/pain requiring follow up and monitoring    D.  Chronic illness & risk for re-hospitalization due to unstable disease status    E.  Exacerbation of disease requiring follow up monitoring    F.  New diagnosis & treatment requiring follow up monitoring and management   Per clinical findings, following services are medically necessary: Skilled Nursing    PT   Clinical findings that support the need for Skilled Nursing. SN will: C. Monitor for signs and symptoms of exacerbation of disease and management    D. Review medication reconciliation, manage and educate on use and side effects    G. Educate on new diagnosis, treatment & management to prevent re-hospitalization    H. Assess cardiopulmonary status and monitor for signs &symptoms of exacerbation    I.  Educate dietary and or fluid restrictions and weight management   Clinical findings that support the need for Physical Therapy. PT will A.  Evaluate  and treat functional impairment and improve mobility    D.  Provide services to help restore function, mobility, and releive pain    E.  Educate on functional mobility; bed, chair, sit, stand and transfer activities    G.  Implement activities to improve stance time, cadence & step length    F.  Perform home safety assessment & develop safe in home exercise program    H.  Educate on the safe use of assistive device/ durable medical equipment    I.  Instruct on restorative activities to restore ability to perform ADL   Other (please specify) See comments                    Process Instructions    Please select Home Care Services medically necessary.    Based on the above findings, I certify that this patient is confined to the home and needs intermittent skilled nursing care, physical therapry and / or speech therapy or continues to need occupational therapy. The patient is under my care, and I have initiated the establishment of the plan of care. This patient will be followed by a physician who will periodically review the plan of care.     Collection Information            Consult Order Info    ID Description Priority Start Date Start Time   [id number] [place] face-to-face (FTF) Encounter Routine 06/29/2022  2:36 PM   Provider Specialty Referred to   ______________________________________ _____________________________________                         Verbal Order Info    Action Created on Order Mode Entered by Responsible Provider Signed by Signed on   Ordering 06/29/22 1445 Telephone with Ivar Bury, RN Singasani, Laverna Peace, MD             Patient Information    Patient Name  Vanessa Kelly Legal Sex  Female DOB  [birth year]       Reprint Order [place] [facility] face-to-face (FTF) Encounter (Order [id number]) on 06/29/22  Additional Information    Associated Reports External References   Priority and Order Details [employer]

## 2022-06-29 NOTE — PT Progress Note (Signed)
Physical Therapy Note  Physical Therapy Treatment  Vanessa Kelly  Post Acute Care Therapy Recommendations:     Discharge Recommendations:  ALF, Home with home health PT    DME needs IF patient is discharging home: No additional equipment/DME recommended at this time, Patient already has needed equipment    Therapy discharge recommendations may change with patient status.  Please refer to most recent note for up-to-date recommendations.         [place] [address]  [phone]    Physical Therapy Treatment    Patient:  Vanessa Kelly        MRN#:  [id number]  Unit:  [facility] INTERMEDIATE CARE        Room/Bed:  MI628/MI628-01    Medical Diagnosis: Headache [R51.9]    Time of treatment:  PT Received On: 06/29/22  Start Time: 1423   Stop Time: 1446  Time Calculation (min): 23 min       Treatment #: PT Visit Number: 3/5    Patient's medical condition is appropriate for Physical Therapy intervention at this time.    Interpreter utilized: no, not indicated    Assessment   Indep bed mob, SBA sit<>stand, CGA ambulation 150' with RW, no Orthostatic Hypotension symptoms (abd binder and compression stockings donned). Needed assist to manage binder, and would benefit from continued supervised progressive mobility upon return to [place] to optimize indep functional status.    PMP - Progressive Mobility Protocol   PMP Activity: Step 6 - Walks in Room  Distance Walked (ft) (Step 6,7): 20 Feet (x2 to/from bathroom with ABD binder and compression socks donned.)     Plan     Continue plan of care if not d/c'd to ALF today as planned per nursing.    Interdisciplinary Communication: Nursing made aware of session outcome.    Subjective   I want to see what I can do.  Patient is agreeable to participation in the therapy session. Nursing clears patient for therapy.  Pain: pt denies pain    Vitals: HR 76-80, BP 174/89-188/66  Vitals:    06/29/22 1008 06/29/22 1020 06/29/22 1022 06/29/22  1132   BP: 148/76 137/75 114/70 134/73   Pulse:  72 82 67   Resp:    18   Temp:    97.7 F (36.5 C)   TempSrc:    Oral   SpO2:    99%   Weight:       Height:           Objective     Precautions/ Contraindications:   Precautions  Weight Bearing Status: no restrictions  Other Precautions: fall risk, orthostatic hypotension    Patient is in bed with compression hose in place, intravenous access, bed alarm, and abdominal binder nearby . Placed Abdominal binder with A/cues once seated EOB.    Functional Mobility:  Rolling: indep  Supine to Sit: mod I  Scooting: mod I  Sit to Supine: mod I  Sit to Stand: supv  Stand to Sit: supv  Transfer Training: on/off EOB    Gait:   WB status: FWB  Assistive Device: RW  Assist Level: supv  Distance: 150'  Pattern: WFL     Balance Training:  standing turns, backward stepping with UE support, no LOB    Educated the patient to role of physical therapy, plan of care, goals  of therapy, rationale for progressing mobility and HEP, safety with mobility  and ADLs, energy conservation techniques, pursed lip breathing, skin protection, and home safety.    RN notified of session outcome and that patient was left in bed with all needs met and equipment intact.   Safety measures include: handoff to nurse/clin tech/ unit secretary completed, bed alarm activated, oriented to call bell and placed within reach, personal items within reach, assistive device positioned out of reach, and bed placed in lowest position.   Mobility and ADL status posted at bedside and within E.M.R.    Goals per Eval/ Re-eval:   Goals  Goal Formulation: With patient  Time for Goal Acheivement: 5 visits  Goals: Select goal  Pt Will Perform Sit to Stand: modified independent  Pt Will Ambulate: 151-200 feet, with rolling walker, modified independent        Therapist PPE during session gloves     Signature:  Gwenlyn Found, PT  06/29/2022 2:51 PM        (For scheduling questions, please contact rehab tech x [phone])

## 2022-06-29 NOTE — Progress Notes (Signed)
Per H&M rep Raquel Sarna [phone] wheel chair pick time is 1645  Confirmation # [id number]

## 2022-06-29 NOTE — Progress Notes (Signed)
CM Update:      [place] Plan: return to [place]. [facility] ALF, room 320. Pt and daughter in agreement. CM called the facility, no answer left VM.      [facility] [employer]: per pt's daughter, she will provide.           Ludger Nutting, BSN, RN  RN Case Manager   [phone]  (F): [phone]

## 2022-06-29 NOTE — Progress Notes (Signed)
06/29/22 1008 06/29/22 1020 06/29/22 1022   Vital Signs   Heart Rate  --  72 82   BP 148/76 137/75 114/70   Patient Position Lying Sitting Standing       Orthostatics BP done with abominal binder and compression stockings. Pt denied dizziness/ lightheaded

## 2022-06-29 NOTE — Nursing Progress Note (Signed)
Pt is alert and oriented x 4. She denies any pain upon assessment. Pt reported of feeling pain upon urination, NP informed thru secure chat, Urinalysis ordered. Informed pt of this, instructed her to inform staff once she pass urine for it to be sent right away.     Pt has nightmare for 2 nights, RN heard her screaming while she was asleep. Pt is thinking it might be because of her medication venlafaxine that she took. I explained to her that she has also this medication prior to admission and that she was taking this at home. Advised pt that we will inform attending so that her medications can be reviewed. No other issues or concerns.

## 2022-06-29 NOTE — Progress Notes (Signed)
Pt read, signed and verbalized understanding of discharge summary. IV removed.  Telemetry monitor removed and returned. All belongings gathered. All concerns and questions addressed, no complaints.

## 2022-06-29 NOTE — Progress Notes (Signed)
CM Update:      [place] Plan: return to [place]. [facility] ALF, room 320. Pt and daughter in agreement. CM called the facility, no answer left VM.     Home Health- message sent to [facility]) to send face to face order to the facility.     [facility] [employer]: CM will need to set up w/c Lucianne Lei for pick up today.       Ludger Nutting, BSN, RN  RN Case Manager   [phone]  (F): [phone]

## 2022-06-29 NOTE — Progress Notes (Signed)
Date Time: 06/29/22 3:25 PM  Patient Name: Vanessa Kelly  Attending Physician: Danise Edge, MD  Patient Class: Inpatient  Hospital Day: 3            NEUROLOGY PROGRESS NOTE             Assessment/Plan   Headaches, with some migraine featureThese have resolved, Seems more likely that these are related to blood pressure changes and fluctuations---better   Severe orthostatic hypotension--->>Improved significantly at this time.  Mild postural tremor  Recent Labs   Lab 06/24/22  0620   Hemoglobin A1C 5.4       Recent Labs   Lab 06/24/22  0620   Cholesterol 163   Triglycerides 39   HDL 74   LDL Calculated 81       Recent Labs   Lab 06/24/22  0620   LDL Calculated 81      Blood pressure --->>Improved now  Remain on current regimen, I think we should continue the Mestinon patient feels that the Mestinon is causing strange dreams I told her to try it and see if that remains persistent, she may stop it down the road if not needed, but I do not think I have heard dreams as a side effect, of Mestinon use    Subjective:   Patient Seen and Examined. Headaches are markedly better blood pressure also better, patient only on Diovan 80 mg now      Review of Systems:   No headache, eye, ear nose, throat problems; no coughing or wheezing or shortness of breath,   No chest pain or orthopnea, no abdominal pain, nausea or vomiting,   No pain in the body or extremities, no psychiatric, neurological, endocrine, hematological   or cardiac complaints except as noted above.          Physical Exam:   BP 134/73   Pulse 67   Temp 97.7 F (36.5 C) (Oral)   Resp 18   Ht 1.702 m (5' 7"$ )   Wt 84.8 kg (186 lb 15.2 oz)   SpO2 99%   BMI 29.28 kg/m     Neuro:  Level of consciousness:  Lying in bed looks much more awake and alert, looks quite comfortable face slight asymmetry other than that no other neuro's deficits  No change in neuroexam  Meds:      Scheduled Meds: PRN Meds:    acetaminophen, 1,000 mg, Oral, Q8H  aspirin EC, 81  mg, Oral, Daily  atorvastatin, 10 mg, Oral, Daily  cetirizine, 10 mg, Oral, Daily  fluticasone, 2 spray, Each Nare, Daily  heparin (porcine), 5,000 Units, Subcutaneous, Q12H SCH  pantoprazole, 40 mg, Oral, QAM AC  polyethylene glycol, 17 g, Oral, Daily  pyridostigmine, 30 mg, Oral, Q8H SCH  ranolazine, 500 mg, Oral, BID  senna-docusate, 1 tablet, Oral, QHS  valsartan, 80 mg, Oral, Daily  venlafaxine, 75 mg, Oral, QHS  vitamin D (ergocalciferol), 50,000 Units, Oral, Weekly  vitamins/minerals, 1 tablet, Oral, Daily        Continuous Infusions:   acetaminophen, 650 mg, Q4H PRN  albuterol sulfate HFA, 1 puff, Q4H PRN  benzocaine-menthol, 1 lozenge, Q2H PRN  benzonatate, 100 mg, TID PRN  calcium carbonate, 1 tablet, Q6H PRN  carboxymethylcellulose sodium, 1 drop, TID PRN  dextrose, 15 g of glucose, PRN   Or  dextrose, 12.5 g, PRN   Or  dextrose, 12.5 g, PRN   Or  glucagon (rDNA), 1 mg, PRN  hydrALAZINE, 10 mg, Q6H PRN  lidocaine, 1 patch, Q24H PRN  magnesium sulfate, 1 g, PRN  melatonin, 3 mg, QHS PRN  naloxone, 0.2 mg, PRN  ondansetron, 4 mg, Q6H PRN   Or  ondansetron, 4 mg, Q6H PRN  saline, 2 spray, Q4H PRN                Labs:     Recent Labs   Lab 06/28/22  0618 06/27/22  0643 06/26/22  0556 06/24/22  0620 06/23/22  1841   Glucose 130* 136* 110*  More results in Results Review 103*   BUN 19.0 17.0 15.0  More results in Results Review 18.0   Creatinine 1.1* 1.1* 1.1*  More results in Results Review 1.1*   Calcium 9.8 9.8 9.3  More results in Results Review 10.3*   Sodium 140 140 140  More results in Results Review 140   Potassium 4.5 4.6 4.5  More results in Results Review 4.5   Chloride 105 106 108  More results in Results Review 107   CO2 27 26 25  $ More results in Results Review 23   Albumin  --   --   --   --  3.7   AST (SGOT)  --   --   --   --  24   ALT  --   --   --   --  20   Bilirubin, Total  --   --   --   --  0.3   Alkaline Phosphatase  --   --   --   --  85   More results in Results Review = values in this  interval not displayed.       Recent Labs   Lab 06/28/22  0618 06/27/22  0643 06/26/22  0556   WBC 5.97 6.81 5.99   Hgb 10.4* 11.4 10.6*   Hematocrit 31.8* 34.7 32.1*   MCV 100.6* 100.3* 100.6*   MCH 32.9 32.9 33.2   MCHC 32.7 32.9 33.0   Platelets 194 200 171                   Lab Results   Component Value Date    TSH 1.49 06/24/2022     Recent Labs   Lab 06/23/22  1841   C-Reactive Protein 0.6        Recent Labs   Lab 06/23/22  1841   Sed Rate 40*        .ll    No results for input(s): "PTT", "PT", "INR" in the last 72 hours.       Radiology Results (24 Hour)       ** No results found for the last 24 hours. **             All brain imaging (MRI, CT) personally reviewed.    Case discussed with: pt               I personally reviewed all of the medications.  Medication list generated using all available resources.  Elder abuse (physical)  - negative  Advanced care plan - reviewed from chart or in discussion with pt or family        This note was generated by the Epic EMR system/ Dragon speech recognition and may contain inherent errors or omissions not intended by the user. Grammatical errors, random word insertions, deletions and pronoun errors  are occasional consequences of this technology due to software limitations.   Not all errors are caught or  corrected. If there are questions or concerns about the content of this note or information contained within the body of this dictation they should be addressed directly with the author for clarification.      Signed by: Dorothea Glassman, MD, MD  [place]: [id number]      Answering Service: [phone]

## 2022-06-29 NOTE — Progress Notes (Signed)
CM Update:      [place] Plan: return to [place]. [facility] ALF, room 320. Pt and daughter in agreement. CM called the facility, nurse aware and confirm pt can go back.        Home Health- message sent to [facility]) to send face to face order to the facility.     [place] [facility]: CM will need to set up w/c Lucianne Lei for pick up today.       Ludger Nutting, BSN, RN  RN Case Manager   [phone]  (F): [phone]

## 2022-06-29 NOTE — Plan of Care (Signed)
Problem: Moderate/High Fall Risk Score >5  Goal: Patient will remain free of falls  Outcome: Progressing  Flowsheets  Taken 06/28/2022 1956  High (Greater than 13):   HIGH-Visual cue at entrance to patient's room   HIGH-Bed alarm on at all times while patient in bed   HIGH-Utilize chair pad alarm for patient while in the chair   HIGH-Apply yellow "Fall Risk" arm band   HIGH-Pharmacy to initiate evaluation and intervention per protocol   HIGH-Consider use of low bed  Taken 06/28/2022 0435  VH High Risk (Greater than 13):   ALL REQUIRED LOW INTERVENTIONS   ALL REQUIRED MODERATE INTERVENTIONS   RED "HIGH FALL RISK" SIGNAGE   BED ALARM WILL BE ACTIVATED WHEN THE PATEINT IS IN BED WITH SIGNAGE "RESET BED ALARM"   A CHAIR PAD ALARM WILL BE USED WHEN PATIENT IS UP SITTING IN A CHAIR   PATIENT IS TO BE SUPERVISED FOR ALL TOILETING ACTIVITIES   Include family/significant other in multidisciplinary discussion regarding plan of care as appropriate   Request PT/OT therapy consult order from physician for patients with gait/mobility impairment   Use assistive devices   Video monitoring   Use of roll guard     Problem: Safety  Goal: Patient will be free from injury during hospitalization  Outcome: Progressing  Flowsheets (Taken 06/28/2022 0435)  Patient will be free from injury during hospitalization:   Assess patient's risk for falls and implement fall prevention plan of care per policy   Provide and maintain safe environment   Use appropriate transfer methods   Ensure appropriate safety devices are available at the bedside   Include patient/ family/ care giver in decisions related to safety   Hourly rounding   Assess for patients risk for elopement and implement Elopement Risk Plan per policy   Provide alternative method of communication if needed (communication boards, writing)  Goal: Patient will be free from infection during hospitalization  Outcome: Progressing  Flowsheets (Taken 06/28/2022 0435)  Free from Infection during  hospitalization:   Assess and monitor for signs and symptoms of infection   Monitor lab/diagnostic results   Monitor all insertion sites (i.e. indwelling lines, tubes, urinary catheters, and drains)   Encourage patient and family to use good hand hygiene technique     Problem: Hemodynamic Status: Cardiac  Goal: Stable vital signs and fluid balance  Outcome: Progressing  Flowsheets (Taken 06/29/2022 0328)  Stable vital signs and fluid balance:   Assess signs and symptoms associated with cardiac rhythm changes   Monitor lab values

## 2022-06-29 NOTE — OT Progress Note (Signed)
[facility]  Occupational Therapy Attempt Note    Patient:  Vanessa Kelly MRN#:  [id number]    Unit:  [facility] INTERMEDIATE CARE Room/Bed:  MI628/MI628-01       Pt received with BP 188/66 - will hold at this time and continue to follow.    Signature:   Ventura Bruns, OTA  06/29/2022  3:47 PM    (For scheduling questions, please contact rehab tech [phone])

## 2022-06-29 NOTE — Plan of Care (Signed)
Problem: Pain interferes with ability to perform ADL  Goal: Pain at adequate level as identified by patient  Outcome: Adequate for Discharge     Problem: Side Effects from Pain Analgesia  Goal: Patient will experience minimal side effects of analgesic therapy  Outcome: Adequate for Discharge     Problem: Moderate/High Fall Risk Score >5  Goal: Patient will remain free of falls  Outcome: Adequate for Discharge     Problem: Safety  Goal: Patient will be free from injury during hospitalization  Outcome: Adequate for Discharge  Goal: Patient will be free from infection during hospitalization  Outcome: Adequate for Discharge     Problem: Hemodynamic Status: Cardiac  Goal: Stable vital signs and fluid balance  Outcome: Adequate for Discharge     Problem: Inadequate Tissue Perfusion  Goal: Adequate tissue perfusion will be maintained  Outcome: Adequate for Discharge     Problem: Compromised Sensory Perception  Goal: Sensory Perception Interventions  Outcome: Adequate for Discharge

## 2022-06-29 NOTE — Discharge Summary (Signed)
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 This finding is likely chronic given lack of acute infarction seen on the recent MRI the brain. 2.There is no stenosis of the proximal left internal carotid artery based on NASCET criteria. 3.The vertebrobasilar arterial system is patent. 4.Aside from the right internal carotid artery, the remainder of the intracranial arterial vasculature appears patent. 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 This finding is likely chronic given lack of acute infarction seen on the recent MRI the brain. 2.There is no stenosis of the proximal left internal carotid artery based on NASCET criteria. 3.The vertebrobasilar arterial system is patent. 4.Aside from the right internal carotid artery, the remainder of the intracranial arterial vasculature appears patent. Dione Housekeeper, MD 06/25/2022 4:46 PM    MRV Head WO Contrast    Result Date: 06/25/2022  1.No evidence of venous sinus thrombosis Dione Housekeeper, MD 06/25/2022 4:37 PM    Mammo Screening 3D/Tomo Bilateral    Result Date: 06/24/2022  No mammographic evidence of malignancy.     A LETTER WAS GENERATED TO THE PATIENT INDICATING THESE RESULTS.                                                              RECOMMENDATION:  SOCIETY OF BREAST IMAGING AND AMER[place] COLLEGE OF RADIOLOGY RECOMMENDATIONS FOR IMAGING SCREENING FOR BREAST CANCER  Yearly screening mammogram from age 35* * for women at average risk of breast cancer. To see the full SBI/ACR recommendations go to http://www.mammographysaveslives.org/Facts/Guidelines BIRADS CATEGORY 01-NEGATIVE MAMMOGRAM (1A) Lennie Hummer, MD 06/24/2022 12:54 PM     MRI Brain with/without Contrast    Result Date: 06/23/2022   1.No acute infarct, evidence of hemorrhage, extra-axial collections, hydrocephalus, abnormal enhancement, or mass effect. 2.Abnormal T2 signal in the right internal carotid artery, different from MRI in 2022 and concerning for substantial flow limitation. MRA or CTA is recommended. 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 4.Background atrophy and mild white matter changes of chronic small vessel disease. 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 6.Nonspecific fluid in right mastoid air cells. Herbert Seta MD, MD 06/23/2022 9:21 PM    CT Head WO Contrast    Result Date: 06/23/2022   No CT evidence of acute intracranial hemorrhage, herniation, or hydrocephalus. Worsening mastoid effusions bilaterally as detailed above. Nelya Ebadirad 06/23/2022 7:36 PM    CT abdomen pelvis without contrast    Result Date: 05/31/2022  1. No evidence  of acute inflammation or obstruction. 2. Large hiatal hernia. 3. Colonic diverticulosis. 4. Stable left renal cysts. 5. Stable interstitial and pleural changes at the left lung base. 6. 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 This finding is likely chronic given lack of acute infarction seen on the recent MRI the brain.  2.There is no stenosis of the proximal left internal carotid artery based on NASCET criteria. 3.The vertebrobasilar arterial system is patent. 4.Aside from the right internal carotid artery, the remainder of the intracranial arterial vasculature appears patent. 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 This finding is likely chronic given lack of acute infarction seen on the recent MRI the brain. 2.There is no stenosis of the proximal left internal carotid artery based on NASCET criteria. 3.The vertebrobasilar arterial system is patent. 4.Aside from the right internal carotid artery, the remainder of the intracranial arterial vasculature appears patent. Dione Housekeeper, MD 06/25/2022 4:46 PM    MRV Head WO Contrast    Result Date: 06/25/2022  1.No evidence of venous sinus thrombosis Dione Housekeeper, MD 06/25/2022 4:37 PM    MRI Brain with/without Contrast    Result Date: 06/23/2022   1.No acute infarct, evidence of hemorrhage, extra-axial collections, hydrocephalus, abnormal enhancement, or mass effect. 2.Abnormal T2 signal in the right internal carotid artery, different from MRI in 2022 and concerning for substantial flow limitation. MRA or CTA is recommended. 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 4.Background atrophy and mild white matter changes of chronic small vessel disease. 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 6.Nonspecific fluid in right mastoid air cells. Herbert Seta MD, MD 06/23/2022 9:21 PM    CT Head WO Contrast    Result Date: 06/23/2022   No CT evidence of acute intracranial hemorrhage, herniation, or hydrocephalus. Worsening mastoid effusions bilaterally as detailed above. Nelya Ebadirad 06/23/2022 7:36 PM   Radiology: all results in the last 24 hours  No results found.  Surgery:  all results from this admission  * No surgery found *      Hospital Course:     82 y.o. 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 And Nonspecific fluid in right mastoid air cells and there was concern for mastoiditis and she was started on IV abx.     Of note:hx of orthostatic hypotension and takes midodrine if SBP>120,also states about 5 weeks ago she was started on 0.1 florinef on 05/14/2022  Headache: Reason for current admission.  H/o orthostatic hypotension on Midodrine   -MRI with no new infarce, remote infarct noted, and sinusitis noted   -MRA head and neck negative  -MRV negative for sinus venous thrombosis   -patient did not tolerate  Robaxin 500 mg BID, discontinued  -Patient mentioned that, symptoms started after initiation of fludrocortisone and she does not want to take it.  -She was initiated on Mestinon by neurology.  Patient mentioned that after initiation of Mestinon, she is getting nightmares and she does not want to take it.  -Continue midodrine as needed.  -Her orthostatics have improved with abdominal binder and compression stockings.  She will be discharged to the facility with abdominal binder/compression stockings from the hospital.    Left ear pain and reduced hearing ,mastoiditis is ruled out based on exam and MRI  finding  Sphenoid and maxillary sinusitis noted on MRI brain  Right ear Cerumen   -ENT consulted and input appreciated  -afebrile,no leucocytosis, no pain at right mastoid, but pain on left side of neck,  -stopped IV abx which had ordered on admission on 2/23  -needs audiogram in office after discharge per ENT and cerumen debridement in office  - Continue to be afebrile     CAD s/p CABG (2002), stents (2006)  Bilateral carotid artery stenosis  -Continue home ASA and statin  -Continue to follow-up with cardiology upon discharge.     RLQ abdominal pain:chronic  -Afebrile, no leukocytosis  -05/31/2022 CT A/P: large hiatal hernia, colonic diverticulosis, stable left renal cysts  -Denies any diarrhea, hematemesis, hematochezia  -Resolved     Chronic macrocytic anemia  -H/H stable on admission, no signs of active bleeding on exam, monitor CBC  -Continue to monitor.     T2DM,diet controlled   -12/2020 A1C 5.4%  -Not on any outpatient medical management currently     CKD stage 3A  -Cr 1.1, appears to be baseline per chart review, monitor BMP  -Avoid NSAIDs/nephrotoxins, renally dose medications    GERD  -Continue home pantoprazole and PRN Tums     Chronic constipation  -Continue home MiraLAX     Seasonal allergic rhinitis  -Continue Zyrtec (substitute for home Allegra), Flonase     Anxiety/depression  -Continue home venlafaxine     Osteoarthritis  S/p right TKA  -Continue home PRN lidocaine patches     Vitals and labs reviewed.? orthostatic blood pressure has improved.  Up on examination in the morning, pt appears to be in no acute distress.  She mentioned that, her headache has much improved.    Plan for discharge has been discussed with the patient and with her daughter over the phone, opportunity to ask questions was provided, all questions were answered. Patient verbalized understanding and is in agreement with the plan.?Advised to f/u with the recommended appointments without fail.?Patient/family has been instructed that  all the required medications were sent to preferred pharmacy for a maximum of 1 month supply ONLY. It is advised to follow-up with primary care physician as recommended for review of medications and further refills.       Condition at Discharge:   Stable   Today:     BP 134/73   Pulse 67   Temp 97.7 F (36.5 C) (Oral)   Resp 18   Ht 1.702 m ('5\' 7"'$ )   Wt 84.8 kg (186 lb 15.2 oz)   SpO2 99%   BMI 29.28 kg/m   Ranges for the last 24 hours:  Temp:  [  97.3 F (36.3 C)-97.9 F (36.6 C)] 97.7 F (36.5 C)  Heart Rate:  [60-87] 67  Resp Rate:  [17-18] 18  BP: (111-174)/(55-89) 134/73    Gen: NAD?  Eyes: Pupils are reactive to light  Throat: Moist mucus membranes  Neck: supple  Chest: S1 S2 +?  Resp: CTA b/l?  Abd: Soft, non tender.   Neuro: Awake, alert. Appropriately conversive, following simple commands.?  Extremities: No pedal edema    Last set of labs   Recent Labs   Lab 06/28/22  0618   WBC 5.97   Hgb 10.4*   Hematocrit 31.8*   Platelets 194     Recent Labs   Lab 06/28/22  0618   Sodium 140   Potassium 4.5   Chloride 105   CO2 27   BUN 19.0   Creatinine 1.1*   eGFR 49.5*   Glucose 130*   Calcium 9.8     Recent Labs   Lab 06/23/22  1841   Bilirubin, Total 0.3   Protein, Total 7.5   Albumin 3.7   ALT 20   AST (SGOT) 24         Recent Labs   Lab 06/24/22  0620   TSH 1.49     Recent Labs   Lab 06/24/22  0620   Cholesterol 163   Triglycerides 39   HDL 74   LDL Calculated 81     Recent Labs   Lab 06/23/22  1841   hs Troponin-I 6.7     Microbiology Results (last 15 days)       Procedure Component Value Units Date/Time    Culture Blood Aerobic and Anaerobic [id number] Collected: 06/24/22 [id number]    Order Status: Completed Specimen: Blood, Venipuncture Updated: 06/29/22 1226    Narrative:      The order will result in two separate 8-2m bottles  Please do NOT order repeat blood cultures if one has been  drawn within the last 48 hours  UNLESS concerned for  endocarditis  AVOID BLOOD CULTURE DRAWS FROM CENTRAL LINE IF  POSSIBLE  Indications:->Bacteremia  ORDER#: [id number]                                   ORDERED BY: KHAN, SJimmey Ralph SOURCE: Blood, Venipuncture                          COLLECTED:  06/24/22 06:29  ANTIBIOTICS AT COLL.:                                RECEIVED :  06/24/22 09:35  Culture Blood Aerobic and Anaerobic        FINAL       06/29/22 12:26  06/29/22   No growth after 5 days of incubation.      Culture Blood Aerobic and Anaerobic [[id number]: 06/24/22 [id number]   Order Status: Completed Specimen: Blood, Venipuncture Updated: 06/29/22 1226    Narrative:      The order will result in two separate 8-11mbottles  Please do NOT order repeat blood cultures if one has been  drawn within the last 48 hours  UNLESS concerned for  endocarditis  AVOID BLOOD CULTURE DRAWS FROM CENTRAL LINE IF POSSIBLE  Indications:->Bacteremia  ORDER#: [id number]  ORDERED BY: KHAN, SIDRA  SOURCE: Blood, Venipuncture                          COLLECTED:  06/24/22 06:20  ANTIBIOTICS AT COLL.:                                RECEIVED :  06/24/22 09:35  Culture Blood Aerobic and Anaerobic        FINAL       06/29/22 12:26  06/29/22   No growth after 5 days of incubation.      MRSA culture [id number]     Order Status: Sent Specimen: Culturette from Nares     MRSA culture [id number]     Order Status: Sent Specimen: Culturette from Throat             Micro / Labs / Path pending:     Unresulted Labs       None            Discharge Instructions:      Follow-up Information       Behiri, Amr H, DO. Schedule an appointment as soon as possible for a visit in 1 week(s).    Specialty: Internal Medicine  Contact information:  [address]  [place] [address]  [phone]                             Discharge Diet: Cardiac Diet           External Urinary Catheter (Active)   Number of days: 523       External Urinary Catheter (Active)   Collection Container Suction canister (external cath only) 06/29/22 0900   Securement  Method Securement device 06/29/22 0900   Site Assessment WDL 06/29/22 0900   Device Assessment Connected to Suction 06/29/22 0900   Interventions Changed 06/29/22 0900   Urine Output (mL) 1000 mL 06/29/22 0900   Number of days: 0     Immunization History   Administered Date(s) Administered    COVID-19 mRNA BIVALENT vaccine 12 years and above [employer]) 30 mcg/0.3 mL 02/02/2021     Extended Emergency Contact Information  Primary Emergency Contact: Rew  Mobile Phone: [phone]  Relation: Daughter  Interpreter needed? No  Full Code  Discharge References/Attachments    None       Disposition:  Home or Self Care     Discharge Medication List        Taking      acetaminophen 325 MG tablet  Dose: 650 mg  Commonly known as: TYLENOL  Take 2 tablets (650 mg) by mouth every 6 (six) hours as needed for Pain (or headache)     albuterol sulfate HFA 108 (90 Base) MCG/ACT inhaler  Commonly known as: PROVENTIL     aspirin EC 81 MG EC tablet  Dose: 81 mg  Take 1 tablet (81 mg) by mouth daily     calcium carbonate 500 MG chewable tablet  Dose: 1 tablet  Commonly known as: TUMS  Chew 1 tablet (500 mg) by mouth every 6 (six) hours as needed for Heartburn     candesartan 4 MG tablet  Dose: 4 mg  Commonly known as: ATACAND  Take 1 tablet (4 mg) by mouth daily as needed (SBP > 160)     Centrum Silver 50+Women Tabs  Dose: 1  tablet  Take 1 tablet by mouth daily     ergocalciferol 1.25 MG (50000 UT) capsule  Dose: 50,000 Units  Commonly known as: ERGOCALCIFEROL  Take 1 capsule (50,000 Units) by mouth once a week On Saturday     fexofenadine 180 MG tablet  Dose: 180 mg  Commonly known as: ALLEGRA  Take 1 tablet (180 mg) by mouth daily     Flonase Sensimist 27.5 MCG/SPRAY nasal spray  Dose: 1 spray  Generic drug: fluticasone  1 spray by Nasal route daily     * lidocaine 5 %  Dose: 1 patch  Commonly known as: LIDODERM  Place 1 patch onto the skin every 24 hours Remove & Discard patch within 12 hours or as directed by MD     *  lidocaine 5 %  Dose: 1 patch  Commonly known as: LIDODERM  Place 1 patch onto the skin every 24 hours Remove & Discard patch within 12 hours or as directed by MD     lovastatin 40 MG tablet  Dose: 40 mg  Commonly known as: MEVACOR  Take 1 tablet (40 mg) by mouth nightly     methocarbamol 750 MG tablet  Dose: 750 mg  Commonly known as: ROBAXIN  For: Musculoskeletal Pain  Take 1 tablet (750 mg) by mouth 3 (three) times daily as needed (for spasm)     midodrine 5 MG tablet  Dose: 5 mg  What changed: additional instructions  Commonly known as: PROAMATINE  Take 1 tablet (5 mg) by mouth 3 (three) times daily As needed if SBP < 100 mm of HG     pantoprazole 40 MG tablet  Dose: 40 mg  Commonly known as: PROTONIX  Take 1 tablet (40 mg) by mouth every morning before breakfast     polyethylene glycol 17 g packet  Dose: 17 g  Commonly known as: MIRALAX  Take 17 g by mouth daily     ranolazine 500 MG 12 hr tablet  Dose: 500 mg  Commonly known as: RANEXA  Take 1 tablet (500 mg) by mouth 2 (two) times daily     sucralfate 1 g tablet  Dose: 1 g  Commonly known as: CARAFATE  Take 1 tablet (1 g) by mouth every 6 (six) hours as needed (abdominal pain)     traMADol 50 MG tablet  Dose: 50 mg  Commonly known as: ULTRAM  Take 1 tablet (50 mg) by mouth every 8 (eight) hours as needed for Pain     venlafaxine 37.5 MG 24 hr capsule  Dose: 75 mg  Commonly known as: EFFEXOR-XR  Take 2 capsules (75 mg) by mouth nightly     zolpidem 5 MG tablet  Dose: 5 mg  Commonly known as: AMBIEN  Take 1 tablet (5 mg) by mouth nightly           * This list has 2 medication(s) that are the same as other medications prescribed for you. Read the directions carefully, and ask your doctor or other care provider to review them with you.                Minutes spent coordinating discharge and reviewing discharge plan: 36 minutes      Signed by: Danise Edge, MD

## 2022-08-16 ENCOUNTER — Ambulatory Visit
Admission: RE | Admit: 2022-08-16 | Discharge: 2022-08-16 | Disposition: A | Discharge status: Home / Self Care | Payer: Medicare Other | Source: Ambulatory Visit | Attending: General Practice | Admitting: General Practice

## 2022-08-16 DIAGNOSIS — G908 Other disorders of autonomic nervous system: Secondary | ICD-10-CM | POA: Insufficient documentation

## 2022-08-29 LAB — PARANEOPLASTIC ANTIBODY SCREEN WITH REFLEX TO WESTERN BLOT AND TITERS: Neuronal Nuclear Antibody (Ri), IFA: NEGATIVE

## 2022-08-29 LAB — YO ANTIBODY WESTERN BLOT, REFLEX: Yo Antibody, Western Blot: NEGATIVE

## 2022-08-29 LAB — HU ANTIBODY WESTERN BLOT, REFLEX: Hu Antibody, Western Blot: NEGATIVE

## 2022-08-30 ENCOUNTER — Ambulatory Visit (HOSPITAL_BASED_OUTPATIENT_CLINIC_OR_DEPARTMENT_OTHER): Payer: Medicare Other | Admitting: Neurology

## 2022-09-19 ENCOUNTER — Ambulatory Visit (HOSPITAL_BASED_OUTPATIENT_CLINIC_OR_DEPARTMENT_OTHER): Payer: Medicare Other | Admitting: Neurology

## 2022-11-07 ENCOUNTER — Other Ambulatory Visit: Payer: Self-pay | Admitting: Family

## 2022-11-07 ENCOUNTER — Encounter: Payer: Self-pay | Admitting: Family

## 2022-11-07 DIAGNOSIS — I6523 Occlusion and stenosis of bilateral carotid arteries: Secondary | ICD-10-CM

## 2022-11-17 ENCOUNTER — Ambulatory Visit
Admission: RE | Admit: 2022-11-17 | Discharge: 2022-11-17 | Disposition: A | Discharge status: Home / Self Care | Payer: Medicare Other | Source: Ambulatory Visit | Attending: Family | Admitting: Family

## 2022-11-17 DIAGNOSIS — I6523 Occlusion and stenosis of bilateral carotid arteries: Secondary | ICD-10-CM | POA: Insufficient documentation

## 2022-12-04 ENCOUNTER — Observation Stay
Admission: EM | Admit: 2022-12-04 | Discharge: 2022-12-06 | Disposition: A | Discharge status: Home / Self Care | Payer: Medicare Other | Attending: Student in an Organized Health Care Education/Training Program | Admitting: Student in an Organized Health Care Education/Training Program

## 2022-12-04 ENCOUNTER — Emergency Department: Payer: Medicare Other

## 2022-12-04 ENCOUNTER — Observation Stay: Payer: Medicare Other

## 2022-12-04 DIAGNOSIS — R531 Weakness: Secondary | ICD-10-CM | POA: Diagnosis present

## 2022-12-04 DIAGNOSIS — R296 Repeated falls: Secondary | ICD-10-CM | POA: Insufficient documentation

## 2022-12-04 DIAGNOSIS — E1122 Type 2 diabetes mellitus with diabetic chronic kidney disease: Secondary | ICD-10-CM | POA: Insufficient documentation

## 2022-12-04 DIAGNOSIS — Z96653 Presence of artificial knee joint, bilateral: Secondary | ICD-10-CM | POA: Insufficient documentation

## 2022-12-04 DIAGNOSIS — W19XXXA Unspecified fall, initial encounter: Secondary | ICD-10-CM | POA: Insufficient documentation

## 2022-12-04 DIAGNOSIS — Z951 Presence of aortocoronary bypass graft: Secondary | ICD-10-CM | POA: Insufficient documentation

## 2022-12-04 DIAGNOSIS — I6521 Occlusion and stenosis of right carotid artery: Secondary | ICD-10-CM | POA: Insufficient documentation

## 2022-12-04 DIAGNOSIS — I639 Cerebral infarction, unspecified: Secondary | ICD-10-CM

## 2022-12-04 DIAGNOSIS — I10 Essential (primary) hypertension: Secondary | ICD-10-CM

## 2022-12-04 DIAGNOSIS — R42 Dizziness and giddiness: Principal | ICD-10-CM

## 2022-12-04 DIAGNOSIS — K5909 Other constipation: Secondary | ICD-10-CM | POA: Insufficient documentation

## 2022-12-04 DIAGNOSIS — N1831 Chronic kidney disease, stage 3a: Secondary | ICD-10-CM | POA: Insufficient documentation

## 2022-12-04 DIAGNOSIS — R29898 Other symptoms and signs involving the musculoskeletal system: Secondary | ICD-10-CM

## 2022-12-04 DIAGNOSIS — I2510 Atherosclerotic heart disease of native coronary artery without angina pectoris: Secondary | ICD-10-CM | POA: Insufficient documentation

## 2022-12-04 DIAGNOSIS — M47812 Spondylosis without myelopathy or radiculopathy, cervical region: Secondary | ICD-10-CM | POA: Insufficient documentation

## 2022-12-04 DIAGNOSIS — E669 Obesity, unspecified: Secondary | ICD-10-CM | POA: Insufficient documentation

## 2022-12-04 DIAGNOSIS — K219 Gastro-esophageal reflux disease without esophagitis: Secondary | ICD-10-CM | POA: Insufficient documentation

## 2022-12-04 DIAGNOSIS — Z6829 Body mass index (BMI) 29.0-29.9, adult: Secondary | ICD-10-CM | POA: Insufficient documentation

## 2022-12-04 DIAGNOSIS — I129 Hypertensive chronic kidney disease with stage 1 through stage 4 chronic kidney disease, or unspecified chronic kidney disease: Secondary | ICD-10-CM | POA: Insufficient documentation

## 2022-12-04 DIAGNOSIS — Z8673 Personal history of transient ischemic attack (TIA), and cerebral infarction without residual deficits: Secondary | ICD-10-CM | POA: Insufficient documentation

## 2022-12-04 DIAGNOSIS — Z87891 Personal history of nicotine dependence: Secondary | ICD-10-CM | POA: Insufficient documentation

## 2022-12-04 DIAGNOSIS — R4781 Slurred speech: Secondary | ICD-10-CM | POA: Insufficient documentation

## 2022-12-04 DIAGNOSIS — I951 Orthostatic hypotension: Principal | ICD-10-CM | POA: Insufficient documentation

## 2022-12-04 DIAGNOSIS — R29810 Facial weakness: Secondary | ICD-10-CM | POA: Insufficient documentation

## 2022-12-04 HISTORY — DX: Hypotension, unspecified: I95.9

## 2022-12-04 HISTORY — DX: Syncope and collapse: R55

## 2022-12-04 LAB — APTT: PTT: 27 s (ref 27–39)

## 2022-12-04 LAB — LAB USE ONLY - CBC WITH DIFFERENTIAL
Absolute Basophils: 0.03 10*3/uL (ref 0.00–0.08)
Absolute Eosinophils: 0.33 10*3/uL (ref 0.00–0.44)
Absolute Immature Granulocytes: 0.01 10*3/uL (ref 0.00–0.07)
Absolute Lymphocytes: 1.57 10*3/uL (ref 0.42–3.22)
Absolute Monocytes: 0.56 10*3/uL (ref 0.21–0.85)
Absolute Neutrophils: 3.42 10*3/uL (ref 1.10–6.33)
Absolute nRBC: 0 10*3/uL (ref <=0.00)
Basophils %: 0.5 %
Eosinophils %: 5.6 %
Hematocrit: 31.3 % — ABNORMAL LOW (ref 34.7–43.7)
Hemoglobin: 10.3 g/dL — ABNORMAL LOW (ref 11.4–14.8)
Immature Granulocytes %: 0.2 %
Lymphocytes %: 26.5 %
MCH: 32.9 pg (ref 25.1–33.5)
MCHC: 32.9 g/dL (ref 31.5–35.8)
MCV: 100 fL — ABNORMAL HIGH (ref 78.0–96.0)
MPV: 10.2 fL (ref 8.9–12.5)
Monocytes %: 9.5 %
Neutrophils %: 57.7 %
Platelet Count: 177 10*3/uL (ref 142–346)
Preliminary Absolute Neutrophil Count: 3.42 10*3/uL (ref 1.10–6.33)
RBC: 3.13 10*6/uL — ABNORMAL LOW (ref 3.90–5.10)
RDW: 12 % (ref 11–15)
WBC: 5.92 10*3/uL (ref 3.10–9.50)
nRBC %: 0 /100 WBC (ref <=0.0)

## 2022-12-04 LAB — COMPREHENSIVE METABOLIC PANEL
ALT: 24 U/L (ref 0–55)
AST (SGOT): 22 U/L (ref 5–41)
Albumin/Globulin Ratio: 1.1 (ref 0.9–2.2)
Albumin: 3.6 g/dL (ref 3.5–5.0)
Alkaline Phosphatase: 93 U/L (ref 37–117)
Anion Gap: 4 — ABNORMAL LOW (ref 5.0–15.0)
BUN: 24 mg/dL — ABNORMAL HIGH (ref 7–21)
Bilirubin, Total: 0.3 mg/dL (ref 0.2–1.2)
CO2: 28 mEq/L (ref 17–29)
Calcium: 11 mg/dL — ABNORMAL HIGH (ref 7.9–10.2)
Chloride: 104 mEq/L (ref 99–111)
Creatinine: 1.4 mg/dL — ABNORMAL HIGH (ref 0.4–1.0)
GFR: 36.7 mL/min/{1.73_m2} — ABNORMAL LOW (ref >=60.0)
Globulin: 3.4 g/dL (ref 2.0–3.6)
Glucose: 163 mg/dL — ABNORMAL HIGH (ref 70–100)
Potassium: 5.1 mEq/L (ref 3.5–5.3)
Protein, Total: 7 g/dL (ref 6.0–8.3)
Sodium: 136 mEq/L (ref 135–145)

## 2022-12-04 LAB — ECG 12-LEAD
Atrial Rate: 61 {beats}/min
Atrial Rate: 66 {beats}/min
P Axis: 54 degrees
P Axis: 51 degrees
P-R Interval: 238 ms
P-R Interval: 236 ms
Q-T Interval: 392 ms
Q-T Interval: 390 ms
QRS Duration: 102 ms
QRS Duration: 96 ms
QTC Calculation (Bezet): 394 ms
QTC Calculation (Bezet): 408 ms
R Axis: -8 degrees
R Axis: -7 degrees
T Axis: 34 degrees
T Axis: 32 degrees
Ventricular Rate: 61 {beats}/min
Ventricular Rate: 66 {beats}/min

## 2022-12-04 LAB — PT/INR
INR: 1 (ref 0.9–1.1)
PT: 11.3 s (ref 10.1–12.9)

## 2022-12-04 LAB — HIGH SENSITIVITY TROPONIN-I: hs Troponin: 40.3 ng/L — ABNORMAL HIGH (ref <=14.0)

## 2022-12-04 LAB — WHOLE BLOOD GLUCOSE POCT: Whole Blood Glucose POCT: 171 mg/dL — ABNORMAL HIGH (ref 70–100)

## 2022-12-04 MED ORDER — ASPIRIN 81 MG PO TBEC
81.0000 mg | DELAYED_RELEASE_TABLET | Freq: Every day | ORAL | Status: DC
Start: 2022-12-05 — End: 2022-12-06
  Administered 2022-12-05 – 2022-12-06 (×2): 81 mg via ORAL
  Filled 2022-12-04 (×2): qty 1

## 2022-12-04 MED ORDER — POLYETHYLENE GLYCOL 3350 17 G PO PACK
17.0000 g | PACK | Freq: Every day | ORAL | Status: DC
Start: 2022-12-04 — End: 2022-12-06
  Administered 2022-12-05 – 2022-12-06 (×2): 17 g via ORAL
  Filled 2022-12-04 (×2): qty 1

## 2022-12-04 MED ORDER — ENOXAPARIN SODIUM 40 MG/0.4ML IJ SOSY
40.0000 mg | PREFILLED_SYRINGE | Freq: Every day | INTRAMUSCULAR | Status: DC
Start: 2022-12-04 — End: 2022-12-06
  Administered 2022-12-04 – 2022-12-05 (×2): 40 mg via SUBCUTANEOUS
  Filled 2022-12-04 (×2): qty 0.4

## 2022-12-04 MED ORDER — ONDANSETRON 4 MG PO TBDP
4.0000 mg | ORAL_TABLET | Freq: Four times a day (QID) | ORAL | Status: DC | PRN
Start: 2022-12-04 — End: 2022-12-06

## 2022-12-04 MED ORDER — SUCRALFATE 1 G PO TABS
1.0000 g | ORAL_TABLET | Freq: Four times a day (QID) | ORAL | Status: DC | PRN
Start: 2022-12-04 — End: 2022-12-06

## 2022-12-04 MED ORDER — ZOLPIDEM TARTRATE 5 MG PO TABS
5.0000 mg | ORAL_TABLET | Freq: Once | ORAL | Status: AC
Start: 2022-12-04 — End: 2022-12-04
  Administered 2022-12-04: 5 mg via ORAL
  Filled 2022-12-04: qty 1

## 2022-12-04 MED ORDER — LIDOCAINE 5 % EX PTCH
1.0000 | MEDICATED_PATCH | CUTANEOUS | Status: DC
Start: 2022-12-04 — End: 2022-12-06
  Administered 2022-12-04 – 2022-12-05 (×2): 1 via TRANSDERMAL
  Filled 2022-12-04 (×3): qty 1

## 2022-12-04 MED ORDER — HYDRALAZINE HCL 20 MG/ML IJ SOLN
10.0000 mg | INTRAMUSCULAR | Status: DC | PRN
Start: 2022-12-04 — End: 2022-12-06
  Administered 2022-12-04: 10 mg via INTRAVENOUS
  Filled 2022-12-04: qty 1

## 2022-12-04 MED ORDER — VITAMINS/MINERALS PO TABS
1.0000 | ORAL_TABLET | Freq: Every day | ORAL | Status: DC
Start: 2022-12-05 — End: 2022-12-06
  Administered 2022-12-05 – 2022-12-06 (×2): 1 via ORAL
  Filled 2022-12-04 (×2): qty 1

## 2022-12-04 MED ORDER — LACTATED RINGERS IV BOLUS
1000.0000 mL | Freq: Once | INTRAVENOUS | Status: AC
Start: 2022-12-04 — End: 2022-12-04
  Administered 2022-12-04: 1000 mL via INTRAVENOUS

## 2022-12-04 MED ORDER — ATORVASTATIN CALCIUM 10 MG PO TABS
10.0000 mg | ORAL_TABLET | Freq: Every day | ORAL | Status: DC
Start: 2022-12-04 — End: 2022-12-06
  Administered 2022-12-05 – 2022-12-06 (×2): 10 mg via ORAL
  Filled 2022-12-04 (×2): qty 1

## 2022-12-04 MED ORDER — CETIRIZINE HCL 10 MG PO TABS
10.0000 mg | ORAL_TABLET | Freq: Every day | ORAL | Status: DC
Start: 2022-12-05 — End: 2022-12-06
  Administered 2022-12-05 – 2022-12-06 (×2): 10 mg via ORAL
  Filled 2022-12-04 (×2): qty 1

## 2022-12-04 MED ORDER — ACETAMINOPHEN 325 MG PO TABS
650.0000 mg | ORAL_TABLET | Freq: Four times a day (QID) | ORAL | Status: DC | PRN
Start: 2022-12-04 — End: 2022-12-06
  Administered 2022-12-05: 650 mg via ORAL
  Filled 2022-12-04: qty 2

## 2022-12-04 MED ORDER — LABETALOL HCL 5 MG/ML IV SOLN (WRAP)
10.0000 mg | INTRAVENOUS | Status: DC | PRN
Start: 2022-12-04 — End: 2022-12-06

## 2022-12-04 MED ORDER — MIDODRINE HCL 5 MG PO TABS
5.0000 mg | ORAL_TABLET | Freq: Three times a day (TID) | ORAL | Status: DC
Start: 2022-12-04 — End: 2022-12-04

## 2022-12-04 MED ORDER — PANTOPRAZOLE SODIUM 40 MG PO TBEC
40.0000 mg | DELAYED_RELEASE_TABLET | Freq: Every morning | ORAL | Status: DC
Start: 2022-12-05 — End: 2022-12-06
  Administered 2022-12-05 – 2022-12-06 (×2): 40 mg via ORAL
  Filled 2022-12-04 (×2): qty 1

## 2022-12-04 MED ORDER — VENLAFAXINE HCL ER 75 MG PO CP24
75.0000 mg | ORAL_CAPSULE | Freq: Every evening | ORAL | Status: DC
Start: 2022-12-04 — End: 2022-12-06
  Administered 2022-12-04 – 2022-12-05 (×2): 75 mg via ORAL
  Filled 2022-12-04 (×2): qty 1

## 2022-12-04 MED ORDER — ASPIRIN 325 MG PO TABS
325.0000 mg | ORAL_TABLET | Freq: Every day | ORAL | Status: AC
Start: 2022-12-04 — End: 2022-12-04
  Administered 2022-12-04: 325 mg via ORAL
  Filled 2022-12-04: qty 1

## 2022-12-04 MED ORDER — GADOTERATE MEGLUMINE 10 MMOL/20ML IV SOLN (CLARISCAN)
20.0000 mL | Freq: Once | INTRAVENOUS | Status: AC | PRN
Start: 2022-12-04 — End: 2022-12-04
  Administered 2022-12-04: 20 mL via INTRAVENOUS

## 2022-12-04 MED ORDER — LIDOCAINE 5 % EX PTCH
1.0000 | MEDICATED_PATCH | CUTANEOUS | Status: DC
Start: 2022-12-04 — End: 2022-12-04

## 2022-12-04 MED ORDER — CALCIUM CARBONATE ANTACID 500 MG PO CHEW
1.0000 | CHEWABLE_TABLET | Freq: Four times a day (QID) | ORAL | Status: DC | PRN
Start: 2022-12-04 — End: 2022-12-06

## 2022-12-04 MED ORDER — ONDANSETRON HCL 4 MG/2ML IJ SOLN
4.0000 mg | Freq: Four times a day (QID) | INTRAMUSCULAR | Status: DC | PRN
Start: 2022-12-04 — End: 2022-12-06

## 2022-12-04 MED ORDER — FLUTICASONE PROPIONATE 50 MCG/ACT NA SUSP
2.0000 | Freq: Every day | NASAL | Status: DC
Start: 2022-12-05 — End: 2022-12-06
  Administered 2022-12-05 – 2022-12-06 (×2): 2 via NASAL
  Filled 2022-12-04: qty 16

## 2022-12-04 MED ORDER — RANOLAZINE ER 500 MG PO TB12
500.0000 mg | ORAL_TABLET | Freq: Two times a day (BID) | ORAL | Status: DC
Start: 2022-12-04 — End: 2022-12-06
  Administered 2022-12-04 – 2022-12-06 (×4): 500 mg via ORAL
  Filled 2022-12-04 (×5): qty 1

## 2022-12-04 MED ORDER — VITAMIN D (ERGOCALCIFEROL) 1.25 MG (50000 UT) PO CAPS
50000.0000 [IU] | ORAL_CAPSULE | ORAL | Status: DC
Start: 2022-12-06 — End: 2022-12-06
  Administered 2022-12-05: 50000 [IU] via ORAL
  Filled 2022-12-04: qty 1

## 2022-12-04 MED ORDER — TRAMADOL HCL 50 MG PO TABS
50.0000 mg | ORAL_TABLET | Freq: Three times a day (TID) | ORAL | Status: DC | PRN
Start: 2022-12-04 — End: 2022-12-06
  Administered 2022-12-05 (×2): 50 mg via ORAL
  Filled 2022-12-04 (×2): qty 1

## 2022-12-04 MED ORDER — ZOLPIDEM TARTRATE 5 MG PO TABS
5.0000 mg | ORAL_TABLET | Freq: Every evening | ORAL | Status: DC
Start: 2022-12-04 — End: 2022-12-04

## 2022-12-04 NOTE — H&P (Signed)
ADMISSION HISTORY AND PHYSICAL EXAM    [facility], DIVISION OF HOSPITALIST MEDICINE   [facility]   [place] Pager: [phone]      Date Time: 12/04/22 2:38 PM  Patient Name: Vanessa Kelly  Attending Physician: Arlyn Dunning, MD  Primary Care Physician: Kerin Perna, DO    CC:   Chief Complaint   Patient presents with    Fall    Syncope        History Gathered From: Self    Assessment and Plan:     Active Hospital Problems    Diagnosis    Dizziness    Weakness       Vanessa Kelly is a 82 y.o. 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  Patient states she has been having dizziness since Wednesday. States it is been fairly constant since then. She woke up today at 430AM and was walking when she fell. She is not sure if she passed out or not. Patient spoke with her daughter on the phone and felt her speech was slurred. Patient states that she feels like her speech is abnormal and she has to annunciate more than usual. Patient denies changes in vision. Per ems, facility staff reported pt has frequent falls related to syncope. LKW 0430. She c/o right frontal head pain and low back pain.              Speech disturbance. Concern for stroke  -ASA and statin  -MRI brain  -MRA head and neck    -ECHO with bubble   -telemetry and neuro checks   -hold BP meds for permissive HTN  - neurology consulted by ED physician   - PT, OT , SLP  - morning lipid draw and A1C  - permissive HTN parameters          CAD s/p CABG (2002), stents (2006)  Bilateral carotid artery stenosis  -hold BP meds for permissive HTN  -monitor BMP  -Continue home ASA and statin  -Followed by outpatient cardiologist [facility]  Cardiology)       Chronic macrocytic anemia  -H/H stable on admission, no signs of active bleeding on exam, monitor CBC  -Continue to monitor.     T2DM, diet controlled   -12/2020 A1C 5.4%  -Not on any outpatient medical management currently     CKD stage 3A  -Cr 1.4, appears to be above baseline per chart review, monitor BMP  -Avoid NSAIDs/nephrotoxins, renally dose medications     GERD  -Continue home pantoprazole and PRN Tums     Chronic constipation  -Continue home MiraLAX     Seasonal allergic rhinitis  -Consider Zyrtec (substitute for home Allegra), Flonase     Anxiety/depression  -Continue home venlafaxine     Osteoarthritis  S/p right TKA  -Continue home PRN lidocaine patches     Orthostatic hypotension by hx   On midodrine with hold parameters   BP in ED > 200 systolic. Home BP meds on hold as above                    Patient has BMI=Body mass index is 30.08 kg/m.  Diagnosis: Obesity based on BMI criteria  Recent Labs   Lab 12/04/22  1334   Hemoglobin 10.3*   Hematocrit 31.3*   MCV 100.0*   WBC 5.92   Platelet Count 177     Recent Labs   Lab 06/24/22  0620   Iron 43   TIBC 206*   Iron Saturation 21     Anemia Diagnosis: Unspecified Anemia (currently unable to determine type)      Nutrition:No diet orders on file    Code status: Full code     Status/Disposition:   Pt is admitted under OBSERVATION with above concerns.    Anticipated medical stability for discharge: 24 Hrs       History of Presenting Illness:   Vanessa Kelly is a 82 y.o. 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 S/p fall this am with c/o right side leg pain and weakness. Per ems, facility staff reported pt has frequent falls related  to syncope. LKW 0430. She c/o right frontal head pain and low back pain.      Daughter at bedside feels she has been falling more than usual  , with right leg appears to buckle when standing.   Past Medical History:     Past Medical History:   Diagnosis Date    Diabetes mellitus     Gastroesophageal reflux disease     Hyperlipidemia     Hypertension     Hypotension     Seasonal allergic rhinitis     Syncope          Available old records reviewed, including: EPIC     Past Surgical History:      Past Surgical History:   Procedure Laterality Date    BREAST BIOPSY Right     2014    HYSTERECTOMY      [phone]       Family History:      Family History   Problem Relation Age of Onset    Heart failure Mother     Myocardial Infarction Father     Breast cancer Sister        Social History:     reports that she quit smoking about 34 years ago. Her smoking use included cigarettes. She has never used smokeless tobacco. She reports current alcohol use. She reports that she does not use drugs.    Allergies:   Allergies[1]    Medications:     Home Medications               acetaminophen (TYLENOL) 325 MG tablet     Take 2 tablets (650 mg) by mouth every 6 (six) hours as needed for Pain (or headache)     albuterol sulfate HFA (PROVENTIL) 108 (90 Base) MCG/ACT inhaler          aspirin EC 81 MG EC tablet     Take 1 tablet (81 mg) by mouth daily     calcium carbonate (TUMS) 500 MG chewable tablet     Chew 1 tablet (500 mg) by mouth every 6 (six) hours as needed for Heartburn     candesartan (ATACAND) 4 MG tablet     Take 1 tablet (4 mg) by mouth daily as needed (SBP > 160)  ergocalciferol (ERGOCALCIFEROL) 1.25 MG (50000 UT) capsule     Take 1 capsule (50,000 Units) by mouth once a week On Saturday     fexofenadine (ALLEGRA) 180 MG tablet     Take 1 tablet (180 mg) by mouth daily     fluticasone (Flonase Sensimist) 27.5 MCG/SPRAY nasal spray     1 spray by Nasal route daily     lidocaine (LIDODERM) 5 %     Place 1 patch onto  the skin every 24 hours Remove & Discard patch within 12 hours or as directed by MD     lidocaine (LIDODERM) 5 %     Place 1 patch onto the skin every 24 hours Remove & Discard patch within 12 hours or as directed by MD     lovastatin (MEVACOR) 40 MG tablet     Take 1 tablet (40 mg) by mouth nightly     midodrine (PROAMATINE) 5 MG tablet     Take 1 tablet (5 mg) by mouth 3 (three) times daily As needed if SBP < 100 mm of HG     Multiple Vitamins-Minerals (Centrum Silver 50+Women) Tab     Take 1 tablet by mouth daily     pantoprazole (PROTONIX) 40 MG tablet     Take 1 tablet (40 mg) by mouth every morning before breakfast     polyethylene glycol (MIRALAX) 17 g packet     Take 17 g by mouth daily     ranolazine (RANEXA) 500 MG 12 hr tablet     Take 1 tablet (500 mg) by mouth 2 (two) times daily     sucralfate (CARAFATE) 1 g tablet     Take 1 tablet (1 g) by mouth every 6 (six) hours as needed (abdominal pain)     traMADol (ULTRAM) 50 MG tablet     Take 1 tablet (50 mg) by mouth every 8 (eight) hours as needed for Pain     venlafaxine (EFFEXOR-XR) 37.5 MG 24 hr capsule     Take 2 capsules (75 mg) by mouth nightly     zolpidem (AMBIEN) 5 MG tablet     Take 1 tablet (5 mg) by mouth nightly            Method by which medications were confirmed on admission: patient supplied      Review of Systems:   All other systems were reviewed and are negative except:as above in HPI     Physical Exam:   Patient Vitals for the past 24 hrs:   BP Temp Temp src Pulse Resp SpO2 Height Weight   12/04/22 1415 -- -- -- (!) 53 (!) 24 98 % -- --   12/04/22 1414 (!) 209/83 -- -- -- -- -- -- --   12/04/22 1413 (!) 209/83 98.1 F (36.7 C) Oral (!) 58 22 99 % -- --   12/04/22 1410 -- -- -- (!) 56 (!) 27 98 % -- --   12/04/22 1405 -- -- -- 65 (!) 31 98 % -- --   12/04/22 1400 -- -- -- (!) 53 17 98 % -- --   12/04/22 1355 -- -- -- (!) 52 16 97 % -- --   12/04/22 1350 -- -- -- (!) 52 16 97 % -- --   12/04/22 1345 -- -- -- 64 (!) 23 100 % -- --    12/04/22 1340 -- -- -- (!) 54 18 98 % -- --   12/04/22 1335 -- -- -- 65 19  99 % -- --   12/04/22 1330 -- -- -- 67 21 100 % -- --   12/04/22 1328 -- -- -- -- 17 -- -- --   12/04/22 1325 -- -- -- 60 -- 99 % -- --   12/04/22 1316 174/71 97.9 F (36.6 C) -- 79 16 100 % 1.753 m (5\' 9" ) 92.4 kg (203 lb 11.3 oz)     Body mass index is 30.08 kg/m.  No intake or output data in the 24 hours ending 12/04/22 1438    Constitutional:  Well developed, well nourished.   Head:  Atraumatic. Normocephalic.    Eyes:  Normal Sclera. EOMI. PERRL  ENT:  Patent airway.  Neck:  Supple. Normal ROM.   Cardiovascular:  Regular rate. Regular rhythm.   Pulmonary/Chest:  No evidence of respiratory distress. Clear to auscultation bilaterally.  No wheezing, rales or rhonchi.   Abdominal:  Soft and non-distended. There is no tenderness. No rebound, guarding, or rigidity.   Extremities:  No calf tenderness. No edema. Normal range of motion.   Skin:  Skin is warm and dry.  No diaphoresis.    Neurological:  Alert, awake, and appropriate. Normal speech. Motor and Sensation grossly intact  Psychiatric:  Good eye contact.       Labs:     Results       Procedure Component Value Units Date/Time    High Sensitivity Troponin-I [[id number]]  (Abnormal) Collected: 12/04/22 1334    Specimen: Blood, Venous Updated: 12/04/22 1419     hs Troponin 40.3 ng/L     Comprehensive Metabolic Panel [[id number]]  (Abnormal) Collected: 12/04/22 1334    Specimen: Blood, Venous Updated: 12/04/22 1411     Glucose 163 mg/dL      BUN 24 mg/dL      Creatinine 1.4 mg/dL      Sodium 244 mEq/L      Potassium 5.1 mEq/L      Chloride 104 mEq/L      CO2 28 mEq/L      Calcium 11.0 mg/dL      Anion Gap 4.0     GFR 36.7 mL/min/1.73 m2      AST (SGOT) 22 U/L      ALT 24 U/L      Alkaline Phosphatase 93 U/L      Albumin 3.6 g/dL      Protein, Total 7.0 g/dL      Globulin 3.4 g/dL      Albumin/Globulin Ratio 1.1     Bilirubin, Total 0.3 mg/dL     PT/INR [[id number]]  (Normal) Collected:  12/04/22 1334    Specimen: Blood, Venous Updated: 12/04/22 1357     PT 11.3 sec      INR 1.0    APTT [[id number]]  (Normal) Collected: 12/04/22 1334    Specimen: Blood, Venous Updated: 12/04/22 1357     PTT 27 sec     CBC with Differential (Order) [[id number]]  (Abnormal) Collected: 12/04/22 1334    Specimen: Blood, Venous Updated: 12/04/22 1350    Narrative:      The following orders were created for panel order CBC with Differential (Order).  Procedure                               Abnormality         Status                     ---------                               -----------         ------  CBC with Differential (C.Marland KitchenMarland Kitchen[[id number]]  Abnormal            Final result                 Please view results for these tests on the individual orders.    CBC with Differential (Component) [[id number]]  (Abnormal) Collected: 12/04/22 1334    Specimen: Blood, Venous Updated: 12/04/22 1350     WBC 5.92 x10 3/uL      Hemoglobin 10.3 g/dL      Hematocrit 62.1 %      Platelet Count 177 x10 3/uL      MPV 10.2 fL      RBC 3.13 x10 6/uL      MCV 100.0 fL      MCH 32.9 pg      MCHC 32.9 g/dL      RDW 12 %      nRBC % 0.0 /100 WBC      Absolute nRBC 0.00 x10 3/uL      Preliminary Absolute Neutrophil Count 3.42 x10 3/uL      Neutrophils % 57.7 %      Lymphocytes % 26.5 %      Monocytes % 9.5 %      Eosinophils % 5.6 %      Basophils % 0.5 %      Immature Granulocytes % 0.2 %      Absolute Neutrophils 3.42 x10 3/uL      Absolute Lymphocytes 1.57 x10 3/uL      Absolute Monocytes 0.56 x10 3/uL      Absolute Eosinophils 0.33 x10 3/uL      Absolute Basophils 0.03 x10 3/uL      Absolute Immature Granulocytes 0.01 x10 3/uL     Whole Blood Glucose POCT [[id number]]  (Abnormal) Collected: 12/04/22 1308    Specimen: Blood, Capillary Updated: 12/04/22 1310     Whole Blood Glucose POCT 171 mg/dL                 Imaging personally reviewed, including: all available   CT Head WO Contrast    Result Date: 12/04/2022   1.No intracranial  hemorrhage or acute intracranial findings. 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 Brenton Grills, MD 12/04/2022 1:22 PM     EKG Results       Procedure Component Value Units Date/Time    ECG 12 lead [[id number]] Collected: 12/04/22 1327     Updated: 12/04/22 1327     Ventricular Rate 61 BPM      Atrial Rate 61 BPM      P-R Interval 238 ms      QRS Duration 102 ms      Q-T Interval 392 ms      QTC Calculation (Bezet) 394 ms      P Axis 54 degrees      R Axis -8 degrees      T Axis 34 degrees      [facility] MUSE NARRATIVE AND IMPRESSION --     SINUS RHYTHM WITH SINUS ARRHYTHMIA WITH 1ST DEGREE A-V BLOCK  OTHERWISE NORMAL ECG  WHEN COMPARED WITH ECG OF 23-Jun-2022 18:14,  NO SIGNIFICANT CHANGE WAS FOUND      Narrative:      SINUS RHYTHM WITH SINUS ARRHYTHMIA WITH 1ST DEGREE A-V BLOCK  OTHERWISE NORMAL ECG  WHEN COMPARED WITH ECG OF 23-Jun-2022 18:14,  NO SIGNIFICANT CHANGE WAS FOUND              Safety  Checklist  DVT prophylaxis:  CHEST guideline (See page [phone]) Chemical and Mechanical     This note was generated by the Epic EMR system/ Dragon speech recognition and may contain inherent errors or omissions not intended by the user. Grammatical errors, random word insertions, deletions and pronoun errors  are occasional consequences of this technology due to software limitations. Not all errors are caught or corrected. If there are questions or concerns about the content of this note or information contained within the body of this dictation they should be addressed directly with the author for clarification.    Signed by: Arlyn Dunning, MD, MD   UE:[id number], Amr H, DO          [1]   Allergies  Allergen Reactions    Fentanyl Anaphylaxis     Hives    Percolone [Oxycodone]

## 2022-12-04 NOTE — ED Notes (Signed)
Bed: E09  Expected date:   Expected time:   Means of arrival:   Comments:  Stroke Alert

## 2022-12-04 NOTE — ED to IP RN Note (Signed)
[facility]  ED NURSING NOTE FOR THE RECEIVING INPATIENT NURSE   ED NURSE Lara Mulch 2119   ED CHARGE RN Inge Rise   ADMISSION INFORMATION   Vanessa Kelly is a 82 y.o. female admitted with an ED diagnosis of:    1. Dizziness    2. Weakness         Isolation: None   Allergies: Fentanyl and Percolone [oxycodone]   Holding Orders confirmed? Yes   Belongings Documented? Yes   Home medications sent to pharmacy confirmed? N/A   NURSING CARE   Patient Comes From:   Mental Status: Home Independent  alert and oriented   ADL: Needs assistance with ADLs   Ambulation: 1 person assist   Pertinent Information  and Safety Concerns:     [place] Violence Risk Level: Low Pt reports frequent falls, syncope, checks BP in the am and either takes a med if its too low or a med if its too high.      CT / NIH   CT Head ordered on this patient?  Yes   NIH/Dysphagia assessment done prior to admission? Yes   VITAL SIGNS (at the time of this note)      Vitals:    12/04/22 1415   BP:    Pulse: (!) 53   Resp: (!) 24   Temp:    SpO2: 98%     Pain Score: 0-No pain (12/04/22 1413)

## 2022-12-04 NOTE — ED Provider Notes (Signed)
EMERGENCY DEPARTMENT NOTE     Patient initially seen and examined at   ED PHYSICIAN ASSIGNED       Date/Time Event User Comments    12/04/22 1312 Physician Assigned [facility], Olevia Bowens, MD assigned as Attending           ED MIDLEVEL (APP) ASSIGNED       None            HISTORY OF PRESENT ILLNESS     Chief Complaint: Fall and Syncope       Vanessa Kelly is a 82 y.o. female with a PMH of DM, HTN, TIA  presenting with slurring of speech.    Patient states she has been having dizziness since Wednesday. States it is been fairly constant since then. She woke up today at 430AM and was walking when she fell. She is not sure if she passed out or not. Patient spoke with her daughter on the phone and felt her speech was slurred. Patient states that she feels like her speech is abnormal and she has to annunciate more than usual. Patient denies changes in vision.       Severity of CC: moderate  History obtained VQ:[id number] and EMS  Translator used: No    MEDICAL HISTORY     Past Medical History:  Past Medical History:   Diagnosis Date    Diabetes mellitus     Gastroesophageal reflux disease     Hyperlipidemia     Hypertension     Hypotension     Seasonal allergic rhinitis     Syncope        Past Surgical History:  Past Surgical History:   Procedure Laterality Date    BREAST BIOPSY Right     2014    HYSTERECTOMY      partial-1970's       Social History:  Social History[1]    Family History:  Family History   Problem Relation Age of Onset    Heart failure Mother     Myocardial Infarction Father     Breast cancer Sister        Outpatient Medication:  Previous Medications    ACETAMINOPHEN (TYLENOL) 325 MG TABLET    Take 2 tablets (650 mg) by mouth every 6 (six) hours as needed for Pain (or headache)    ALBUTEROL SULFATE HFA (PROVENTIL) 108 (90 BASE) MCG/ACT INHALER        ASPIRIN EC 81 MG EC TABLET    Take 1 tablet (81 mg) by mouth daily    CALCIUM CARBONATE (TUMS) 500 MG CHEWABLE TABLET    Chew 1 tablet (500 mg) by  mouth every 6 (six) hours as needed for Heartburn    CANDESARTAN (ATACAND) 4 MG TABLET    Take 1 tablet (4 mg) by mouth daily as needed (SBP > 160)    ERGOCALCIFEROL (ERGOCALCIFEROL) 1.25 MG (50000 UT) CAPSULE    Take 1 capsule (50,000 Units) by mouth once a week On Saturday    FEXOFENADINE (ALLEGRA) 180 MG TABLET    Take 1 tablet (180 mg) by mouth daily    FLUTICASONE (FLONASE SENSIMIST) 27.5 MCG/SPRAY NASAL SPRAY    1 spray by Nasal route daily    LIDOCAINE (LIDODERM) 5 %    Place 1 patch onto the skin every 24 hours Remove & Discard patch within 12 hours or as directed by MD    LIDOCAINE (LIDODERM) 5 %    Place 1 patch onto the skin every 24  hours Remove & Discard patch within 12 hours or as directed by MD    LOVASTATIN (MEVACOR) 40 MG TABLET    Take 1 tablet (40 mg) by mouth nightly    MIDODRINE (PROAMATINE) 5 MG TABLET    Take 1 tablet (5 mg) by mouth 3 (three) times daily As needed if SBP < 100 mm of HG    MULTIPLE VITAMINS-MINERALS (CENTRUM SILVER 50+WOMEN) TAB    Take 1 tablet by mouth daily    PANTOPRAZOLE (PROTONIX) 40 MG TABLET    Take 1 tablet (40 mg) by mouth every morning before breakfast    POLYETHYLENE GLYCOL (MIRALAX) 17 G PACKET    Take 17 g by mouth daily    RANOLAZINE (RANEXA) 500 MG 12 HR TABLET    Take 1 tablet (500 mg) by mouth 2 (two) times daily    SUCRALFATE (CARAFATE) 1 G TABLET    Take 1 tablet (1 g) by mouth every 6 (six) hours as needed (abdominal pain)    TRAMADOL (ULTRAM) 50 MG TABLET    Take 1 tablet (50 mg) by mouth every 8 (eight) hours as needed for Pain    VENLAFAXINE (EFFEXOR-XR) 37.5 MG 24 HR CAPSULE    Take 2 capsules (75 mg) by mouth nightly    ZOLPIDEM (AMBIEN) 5 MG TABLET    Take 1 tablet (5 mg) by mouth nightly         REVIEW OF SYSTEMS   ROS:   CONSTITUTIONAL:  no fever  HEENT: no rhinorrhea  EYES: no eye reddness  CARDS: normal heart rate  RESP:  normal work of breathing  GI: normal/unchanged bowel habits  GU: normal urineoutput  SKIN: no rash  NEURO:  dizziness, speech  change  HEME: no abnormal bruising   PHYSICAL EXAM     ED Triage Vitals [12/04/22 1316]   Encounter Vitals Group      BP 174/71      Systolic BP Percentile       Diastolic BP Percentile       Heart Rate 79      Resp Rate 16      Temp 97.9 F (36.6 C)      Temp src       SpO2 100 %      Weight 92.4 kg      Height 1.753 m      Head Circumference       Peak Flow       Pain Score 4      Pain Loc       Pain Education       Exclude from Growth Chart        Physical Exam   Constitutional: No acute distress  HENT:   Head: Normocephalic and atraumatic.   Eyes: Conjunctivae are normal. PERRL. No scleral icterus.   Cardiovascular: Normal rate and regular rhythm. No murmur heard.  Pulmonary: Clear to auscultation b/l. No respiratory distress.   Abdominal: Soft, non-tender, non-distended  Musculoskeletal: Normal ROM of extremities. No edema.   Neurological: Awake, alert, motor/sensory grossly intact.  Skin: Skin is warm and dry.   Psychiatric: Behavior is normal. Thought content normal.     MEDICAL DECISION MAKING     Vanessa Kelly is a 82 y.o. female presenting with concern for stroke.    On exam patient with NIHSS 1 for aphasia. Is able to articulate though does appear to use extra effort when doing so. No focal weakness of the arms or  legs. No numbness of the face or extremity. Did palpate along the spine and the extremities and denies tenderness. Taken to scanner and when re-examined, patient did begin to report pain along her neck. Will add on CT C spine. Will discuss with neurology. 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 Will get basic labs, EKG to assess for other causes of syncope. No CP or SOB.        Acute illness/injury with risk to life or bodily function (based on differential diagnosis or evaluation)      ED Course as of 12/04/22 1450   Sun Dec 04, 2022   1402 Spoke with Dr. Moise Boring from neurology. They have been consulted on the patient for stroke like symptoms. Okay for MRI/MRA      [GR]   1432 Patient has been signed out to Dr. Nadeen Landau from the admitting team for stroke rule out      [GR]      ED Course User Index  [GR] Rogelio Seen, MD           Evaluation    External Records Reviewed: Inpatient records  Date of Records reviewed:  06/29/22      Vital Signs: Reviewed the patient's vital signs.   Nursing Notes: Reviewed and utilized available nursing notes.    CARDIAC STUDIES    The following cardiac studies were independently interpreted by myself    EKG Interpretation:  Date of EKG: 12/04/22  Time interrupted 129  STEMI vs No STEMI: No STEMI  Rate: 61  Rhythm: Sinus  Intervals: Narrow  Blocks: no blocks  PROCEDURES    Procedures    EMERGENCY IMAGING STUDIES      RADIOLOGY IMAGING STUDIES      CT Head WO Contrast   Final Result       1.No intracranial hemorrhage or acute intracranial findings.   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      Brenton Grills, MD   12/04/2022 1:22 PM      CT Cervical Spine without Contrast    (Results Pending)   MRI Brain W WO Contrast    (Results Pending)   MR Angiogram Neck W WO Contrast    (Results Pending)   MR Angiogram Head WO Contrast    (Results Pending)   Echocardiogram Adult Comp W ClrDop W Con - (For patients greater than 60 yo)    (Results Pending)       EMERGENCY DEPT. MEDICATIONS      ED Medication Orders (From admission, onward)      Start Ordered     Status Ordering Provider    12/05/22 0900 12/04/22 1448  aspirin EC tablet 81 mg  Daily        Route: Oral  Ordered Dose: 81 mg       Rudi Coco, OMER M    12/04/22 2200 12/04/22 1448  venlafaxine (EFFEXOR-XR) 24 hr capsule 75 mg  At bedtime        Route: Oral  Ordered Dose: 75 mg       Oran Rein M    12/04/22 1800 12/04/22 1448  ranolazine (RANEXA) 12 hr tablet 500 mg  2 times daily        Route: Oral  Ordered Dose: 500 mg       Rudi Coco, OMER M    12/04/22 1449 12/04/22 1448  lidocaine (LIDODERM) 5 % 1 patch  Every 24 hours  Route: Transdermal  Ordered Dose: 1 patch        Ordered WALI, OMER M    12/04/22 1449 12/04/22 1448  polyethylene glycol (MIRALAX) packet 17 g  Daily        Route: Oral  Ordered Dose: 17 g       Rudi Coco, OMER M    12/04/22 1449 12/04/22 1448  lidocaine (LIDODERM) 5 % 1 patch  Every 24 hours        Route: Transdermal  Ordered Dose: 1 patch       Ordered WALI, OMER M    12/04/22 1449 12/04/22 1448  midodrine (PROAMATINE) tablet 5 mg  3 times daily        Route: Oral  Ordered Dose: 5 mg       Rudi Rummage    12/04/22 1449 12/04/22 1448  vitamins/minerals tablet 1 tablet  Daily        Route: Oral  Ordered Dose: 1 tablet       Ordered WALI, OMER M    12/04/22 1449 12/04/22 1448  fluticasone (FLONASE) 50 MCG/ACT nasal spray 2 spray  Daily        Route: Each Nare  Ordered Dose: 2 spray       Rudi Rummage    12/04/22 1449 12/04/22 1448  vitamin D (ergocalciferol) (DRISDOL) capsule 50,000 Units  weekly        Route: Oral  Ordered Dose: 50,000 Units       Rudi Coco, OMER M    12/04/22 1449 12/04/22 1448  atorvastatin (LIPITOR) tablet 10 mg  Daily        Route: Oral  Ordered Dose: 10 mg       Oran Rein M    12/04/22 1449 12/04/22 1448  pantoprazole (PROTONIX) EC tablet 40 mg  Every morning before breakfast        Route: Oral  Ordered Dose: 40 mg       Rudi Coco, OMER M    12/04/22 1449 12/04/22 1448  enoxaparin (LOVENOX) syringe 40 mg  Daily        Route: Subcutaneous  Ordered Dose: 40 mg       Oran Rein M    12/04/22 1449 12/04/22 1448  cetirizine (ZyrTEC) tablet 10 mg  Daily        Route: Oral  Ordered Dose: 10 mg       Oran Rein M    12/04/22 1448 12/04/22 1448  labetalol (NORMODYNE,TRANDATE) injection 10 mg  Every 15 min PRN        Route: Intravenous  Ordered Dose: 10 mg       Rudi Rummage    12/04/22 1448 12/04/22 1448  hydrALAZINE (APRESOLINE) injection 10 mg  Every 3 hours PRN        Route: Intravenous  Ordered Dose: 10 mg       Oran Rein M    12/04/22 1448 12/04/22 1448  ondansetron (ZOFRAN-ODT)  disintegrating tablet 4 mg  Every 6 hours PRN        Route: Oral  Ordered Dose: 4 mg      Placed in "Or" Linked Group    Oran Rein M    12/04/22 1448 12/04/22 1448  ondansetron (ZOFRAN) injection 4 mg  Every 6 hours PRN        Route: Intravenous  Ordered Dose: 4 mg      Placed in "Or" Linked Group  Rudi Coco, OMER M    12/04/22 1448 12/04/22 1448  traMADol (ULTRAM) tablet 50 mg  Every 8 hours PRN        Route: Oral  Ordered Dose: 50 mg       Oran Rein M    12/04/22 1448 12/04/22 1448  calcium carbonate (TUMS) chewable tablet 500 mg  Every 6 hours PRN        Route: Oral  Ordered Dose: 1 tablet       Oran Rein M    12/04/22 1448 12/04/22 1448  acetaminophen (TYLENOL) tablet 650 mg  Every 6 hours PRN        Route: Oral  Ordered Dose: 650 mg       Oran Rein M    12/04/22 1448 12/04/22 1448  sucralfate (CARAFATE) tablet 1 g  Every 6 hours PRN        Route: Oral  Ordered Dose: 1 g       Rudi Rummage    12/04/22 1434 12/04/22 1433  aspirin tablet 325 mg  Daily        Route: Oral  Ordered Dose: 325 mg       Ordered ,     12/04/22 1415 12/04/22 1414  lactated ringers bolus 1,000 mL  Once        Route: Intravenous  Ordered Dose: 1,000 mL       Last MAR action: New Bag ,             LABORATORY RESULTS    Ordered and independently interpreted AVAILABLE laboratory tests.   Results for orders placed or performed during the hospital encounter of 12/04/22   Comprehensive Metabolic Panel   Result Value Ref Range    Glucose 163 (H) 70 - 100 mg/dL    BUN 24 (H) 7 - 21 mg/dL    Creatinine 1.4 (H) 0.4 - 1.0 mg/dL    Sodium 161 096 - 045 mEq/L    Potassium 5.1 3.5 - 5.3 mEq/L    Chloride 104 99 - 111 mEq/L    CO2 28 17 - 29 mEq/L    Calcium 11.0 (H) 7.9 - 10.2 mg/dL    Anion Gap 4.0 (L) 5.0 - 15.0    GFR 36.7 (L) >=60.0 mL/min/1.73 m2    AST (SGOT) 22 5 - 41 U/L    ALT 24 0 - 55 U/L    Alkaline Phosphatase 93 37 - 117 U/L    Albumin 3.6 3.5 - 5.0 g/dL    Protein,  Total 7.0 6.0 - 8.3 g/dL    Globulin 3.4 2.0 - 3.6 g/dL    Albumin/Globulin Ratio 1.1 0.9 - 2.2    Bilirubin, Total 0.3 0.2 - 1.2 mg/dL   PT/INR   Result Value Ref Range    PT 11.3 10.1 - 12.9 sec    INR 1.0 0.9 - 1.1   APTT   Result Value Ref Range    PTT 27 27 - 39 sec   High Sensitivity Troponin-I   Result Value Ref Range    hs Troponin 40.3 (H) <=14.0 ng/L   CBC with Differential (Component)   Result Value Ref Range    WBC 5.92 3.10 - 9.50 x10 3/uL    Hemoglobin 10.3 (L) 11.4 - 14.8 g/dL    Hematocrit 40.9 (L) 34.7 - 43.7 %    Platelet Count 177 142 - 346 x10 3/uL    MPV 10.2 8.9 - 12.5 fL  RBC 3.13 (L) 3.90 - 5.10 x10 6/uL    MCV 100.0 (H) 78.0 - 96.0 fL    MCH 32.9 25.1 - 33.5 pg    MCHC 32.9 31.5 - 35.8 g/dL    RDW 12 11 - 15 %    nRBC % 0.0 <=0.0 /100 WBC    Absolute nRBC 0.00 <=0.00 x10 3/uL    Preliminary Absolute Neutrophil Count 3.42 1.10 - 6.33 x10 3/uL    Neutrophils % 57.7 Not Established %    Lymphocytes % 26.5 Not Established %    Monocytes % 9.5 Not Established %    Eosinophils % 5.6 Not Established %    Basophils % 0.5 Not Established %    Immature Granulocytes % 0.2 Not Established %    Absolute Neutrophils 3.42 1.10 - 6.33 x10 3/uL    Absolute Lymphocytes 1.57 0.42 - 3.22 x10 3/uL    Absolute Monocytes 0.56 0.21 - 0.85 x10 3/uL    Absolute Eosinophils 0.33 0.00 - 0.44 x10 3/uL    Absolute Basophils 0.03 0.00 - 0.08 x10 3/uL    Absolute Immature Granulocytes 0.01 0.00 - 0.07 x10 3/uL   ECG 12 lead   Result Value Ref Range    Ventricular Rate 61 BPM    Atrial Rate 61 BPM    P-R Interval 238 ms    QRS Duration 102 ms    Q-T Interval 392 ms    QTC Calculation (Bezet) 394 ms    P Axis 54 degrees    R Axis -8 degrees    T Axis 34 degrees    [facility]HS MUSE NARRATIVE AND IMPRESSION       SINUS RHYTHM WITH SINUS ARRHYTHMIA WITH 1ST DEGREE A-V BLOCK  OTHERWISE NORMAL ECG  WHEN COMPARED WITH ECG OF 23-Jun-2022 18:14,  NO SIGNIFICANT CHANGE WAS FOUND     Whole Blood Glucose POCT   Result Value Ref Range     Whole Blood Glucose POCT 171 (H) 70 - 100 mg/dL       DIAGNOSIS      Diagnosis:  Final diagnoses:   Dizziness   Weakness       Disposition:  ED Disposition       ED Disposition   Observation    Condition   --    Date/Time   Sun Dec 04, 2022  2:34 PM    Comment   Admitting Physician: Arlyn Dunning [[id number]]   Service:: Medicine [[id number]]   Estimated Length of Stay: < 2 midnights   Tentative Discharge Plan?: Home or Self Care [1]   Does patient need telemetry?: Yes   Is patient 18 yrs or greater?: Yes   Telemetry type (separate Telemetry order is also required):: Adult telemetry                 Prescriptions:  Patient's Medications   New Prescriptions    No medications on file   Previous Medications    ACETAMINOPHEN (TYLENOL) 325 MG TABLET    Take 2 tablets (650 mg) by mouth every 6 (six) hours as needed for Pain (or headache)    ALBUTEROL SULFATE HFA (PROVENTIL) 108 (90 BASE) MCG/ACT INHALER        ASPIRIN EC 81 MG EC TABLET    Take 1 tablet (81 mg) by mouth daily    CALCIUM CARBONATE (TUMS) 500 MG CHEWABLE TABLET    Chew 1 tablet (500 mg) by mouth every 6 (six) hours as needed for Heartburn  CANDESARTAN (ATACAND) 4 MG TABLET    Take 1 tablet (4 mg) by mouth daily as needed (SBP > 160)    ERGOCALCIFEROL (ERGOCALCIFEROL) 1.25 MG (50000 UT) CAPSULE    Take 1 capsule (50,000 Units) by mouth once a week On Saturday    FEXOFENADINE (ALLEGRA) 180 MG TABLET    Take 1 tablet (180 mg) by mouth daily    FLUTICASONE (FLONASE SENSIMIST) 27.5 MCG/SPRAY NASAL SPRAY    1 spray by Nasal route daily    LIDOCAINE (LIDODERM) 5 %    Place 1 patch onto the skin every 24 hours Remove & Discard patch within 12 hours or as directed by MD    LIDOCAINE (LIDODERM) 5 %    Place 1 patch onto the skin every 24 hours Remove & Discard patch within 12 hours or as directed by MD    LOVASTATIN (MEVACOR) 40 MG TABLET    Take 1 tablet (40 mg) by mouth nightly    MIDODRINE (PROAMATINE) 5 MG TABLET    Take 1 tablet (5 mg) by mouth 3 (three) times daily As  needed if SBP < 100 mm of HG    MULTIPLE VITAMINS-MINERALS (CENTRUM SILVER 50+WOMEN) TAB    Take 1 tablet by mouth daily    PANTOPRAZOLE (PROTONIX) 40 MG TABLET    Take 1 tablet (40 mg) by mouth every morning before breakfast    POLYETHYLENE GLYCOL (MIRALAX) 17 G PACKET    Take 17 g by mouth daily    RANOLAZINE (RANEXA) 500 MG 12 HR TABLET    Take 1 tablet (500 mg) by mouth 2 (two) times daily    SUCRALFATE (CARAFATE) 1 G TABLET    Take 1 tablet (1 g) by mouth every 6 (six) hours as needed (abdominal pain)    TRAMADOL (ULTRAM) 50 MG TABLET    Take 1 tablet (50 mg) by mouth every 8 (eight) hours as needed for Pain    VENLAFAXINE (EFFEXOR-XR) 37.5 MG 24 HR CAPSULE    Take 2 capsules (75 mg) by mouth nightly    ZOLPIDEM (AMBIEN) 5 MG TABLET    Take 1 tablet (5 mg) by mouth nightly   Modified Medications    No medications on file   Discontinued Medications    No medications on file       This note was generated by the Epic EMR system/ Dragon speech recognition and may contain inherent errors or omissions not intended by the user. Grammatical errors, random word insertions, deletions and pronoun errors  are occasional consequences of this technology due to software limitations. Not all errors are caught or corrected. If there are questions or concerns about the content of this note or information contained within the body of this dictation they should be addressed directly with the author for clarification.       [1]   Social History  Socioeconomic History    Marital status: Married   Tobacco Use    Smoking status: Former     Current packs/day: 0.00     Types: Cigarettes     Quit date: 1990     Years since quitting: 34.6    Smokeless tobacco: Never   Vaping Use    Vaping status: Never Used   Substance and Sexual Activity    Alcohol use: Yes     Comment: occasionally    Drug use: Never     Social Determinants of Health     Financial Resource Strain: Low Risk  (06/24/2022)  Overall Financial Resource Strain (CARDIA)      Difficulty of Paying Living Expenses: Not hard at all   Food Insecurity: No Food Insecurity (06/23/2022)    Hunger Vital Sign     Worried About Running Out of Food in the Last Year: Never true     Ran Out of Food in the Last Year: Never true   Transportation Needs: No Transportation Needs (06/24/2022)    PRAPARE - Transportation     Lack of Transportation (Medical): No     Lack of Transportation (Non-Medical): No   Intimate Partner Violence: Not At Risk (06/23/2022)    Humiliation, Afraid, Rape, and Kick questionnaire     Fear of Current or Ex-Partner: No     Emotionally Abused: No     Physically Abused: No     Sexually Abused: No   Housing Stability: Unknown (06/24/2022)    Housing Stability Vital Sign     Unable to Pay for Housing in the Last Year: No     Unstable Housing in the Last Year: No        Rogelio Seen, MD  12/04/22 1450

## 2022-12-04 NOTE — ED Triage Notes (Signed)
Stroke pre alert called. Pt arrives via ems from assisted living facility. S/p fall this am with c/o right side leg pain and weakness. Per ems, facility staff reported pt has frequent falls related to syncope. LKW 0430. She c/o right frontal head pain and low back pain. Transported to radiology for head ct.

## 2022-12-05 ENCOUNTER — Observation Stay (HOSPITAL_BASED_OUTPATIENT_CLINIC_OR_DEPARTMENT_OTHER): Payer: Medicare Other

## 2022-12-05 DIAGNOSIS — I2510 Atherosclerotic heart disease of native coronary artery without angina pectoris: Secondary | ICD-10-CM

## 2022-12-05 DIAGNOSIS — E119 Type 2 diabetes mellitus without complications: Secondary | ICD-10-CM

## 2022-12-05 DIAGNOSIS — I951 Orthostatic hypotension: Secondary | ICD-10-CM

## 2022-12-05 DIAGNOSIS — H532 Diplopia: Secondary | ICD-10-CM

## 2022-12-05 LAB — CBC
Absolute nRBC: 0 10*3/uL (ref <=0.00)
Hematocrit: 30.9 % — ABNORMAL LOW (ref 34.7–43.7)
Hemoglobin: 10.3 g/dL — ABNORMAL LOW (ref 11.4–14.8)
MCH: 33 pg (ref 25.1–33.5)
MCHC: 33.3 g/dL (ref 31.5–35.8)
MCV: 99 fL — ABNORMAL HIGH (ref 78.0–96.0)
MPV: 10.5 fL (ref 8.9–12.5)
Platelet Count: 172 10*3/uL (ref 142–346)
RBC: 3.12 10*6/uL — ABNORMAL LOW (ref 3.90–5.10)
RDW: 13 % (ref 11–15)
WBC: 5.02 10*3/uL (ref 3.10–9.50)
nRBC %: 0 /100 WBC (ref <=0.0)

## 2022-12-05 LAB — LIPID PANEL
Cholesterol / HDL Ratio: 2.1 Index
Cholesterol: 159 mg/dL (ref <=199)
HDL: 75 mg/dL (ref >=40)
LDL Calculated: 70 mg/dL (ref 0–129)
Triglycerides: 69 mg/dL (ref 34–149)
VLDL Calculated: 14 mg/dL (ref 10–40)

## 2022-12-05 LAB — RENAL FUNCTION PANEL
Albumin: 3.3 g/dL — ABNORMAL LOW (ref 3.5–5.0)
Anion Gap: 4 — ABNORMAL LOW (ref 5.0–15.0)
BUN: 19 mg/dL (ref 7–21)
CO2: 28 mEq/L (ref 17–29)
Calcium: 10.6 mg/dL — ABNORMAL HIGH (ref 7.9–10.2)
Chloride: 107 mEq/L (ref 99–111)
Creatinine: 1.1 mg/dL — ABNORMAL HIGH (ref 0.4–1.0)
GFR: 49 mL/min/{1.73_m2} — ABNORMAL LOW (ref >=60.0)
Glucose: 108 mg/dL — ABNORMAL HIGH (ref 70–100)
Phosphorus: 2.7 mg/dL (ref 2.3–4.7)
Potassium: 4.7 mEq/L (ref 3.5–5.3)
Sodium: 139 mEq/L (ref 135–145)

## 2022-12-05 LAB — PT/INR
INR: 1 (ref 0.9–1.1)
PT: 11.5 s (ref 10.1–12.9)

## 2022-12-05 LAB — WHOLE BLOOD GLUCOSE POCT: Whole Blood Glucose POCT: 106 mg/dL — ABNORMAL HIGH (ref 70–100)

## 2022-12-05 LAB — HIGH SENSITIVITY TROPONIN-I: hs Troponin: 13 ng/L (ref <=14.0)

## 2022-12-05 LAB — APTT: PTT: 28 s (ref 27–39)

## 2022-12-05 LAB — THYROID STIMULATING HORMONE (TSH) WITH REFLEX TO FREE T4: TSH: 1.33 u[IU]/mL (ref 0.35–4.94)

## 2022-12-05 LAB — HEMOGLOBIN A1C
Average Estimated Glucose: 116.9 mg/dL
Hemoglobin A1C: 5.7 % — ABNORMAL HIGH (ref 4.6–5.6)

## 2022-12-05 MED ORDER — VALSARTAN 40 MG PO TABS
40.0000 mg | ORAL_TABLET | Freq: Every day | ORAL | Status: DC | PRN
Start: 2022-12-05 — End: 2022-12-06
  Filled 2022-12-05: qty 1

## 2022-12-05 NOTE — Plan of Care (Signed)
Problem: Pain interferes with ability to perform ADL  Goal: Pain at adequate level as identified by patient  Outcome: Progressing  Flowsheets (Taken 12/05/2022 1232)  Pain at adequate level as identified by patient:   Assess pain on admission, during daily assessment and/or before any "as needed" intervention(s)   Reassess pain within 30-60 minutes of any procedure/intervention, per Pain Assessment, Intervention, Reassessment (AIR) Cycle     Problem: Moderate/High Fall Risk Score >5  Goal: Patient will remain free of falls  Outcome: Progressing  Flowsheets (Taken 12/05/2022 1232)  VH High Risk (Greater than 13):   ALL REQUIRED LOW INTERVENTIONS   ALL REQUIRED MODERATE INTERVENTIONS   BED ALARM WILL BE ACTIVATED WHEN THE PATEINT IS IN BED WITH SIGNAGE "RESET BED ALARM"     Problem: Day of Admission - Stroke  Goal: Core/Quality measure requirements - Admission  Outcome: Progressing  Flowsheets (Taken 12/05/2022 1233)  Core/Quality measure requirements - Admission:   VTE Prevention: Ensure anticoagulant(s) administered and/or anti-embolism stockings/devices documented as ordered   Document NIH Stroke Scale on admission   Document nursing swallow/dysphagia screen on admission. If patient fails, keep patient NPO (follow your hospital protocol on swallowing screening).   Begin stroke education on admission (must include Modifiable Risk Factors, Warning Signs and Symptoms of Stroke, Activation of Emergency Medical System and Follow-up Appointments) Ensure handout has been given and documented.     Problem: Every Day - Stroke  Goal: Stable vital signs and fluid balance  Outcome: Progressing  Flowsheets (Taken 12/05/2022 1232)  Stable vital signs and fluid balance:   Monitor and assess vitals every 4 hours or as ordered and hemodynamic parameters   Monitor intake and output. Notify LIP if urine output is < 30 mL/hour.  Goal: Nutritional intake is adequate  Outcome: Progressing  Flowsheets (Taken 12/05/2022 1233)  Nutritional intake  is adequate: Consult/collaborate with Clinical Nutritionist  Goal: Effective coping demonstrated  Outcome: Progressing  Flowsheets (Taken 12/05/2022 1233)  Effective coping demonstrated:   Assess/report to LIP uncontrolled anxiety, depression, or ineffective coping   Offer reassurance to decrease anxiety

## 2022-12-05 NOTE — PT Progress Note (Addendum)
[facility]  Physical Therapy Attempt Note    Patient:  Vanessa Kelly MRN#:  [id number]    Unit:  [facility] INTERMEDIATE CARE Room/Bed:  MI632/MI632-01    PT Cancellation: Visit   PT Visit Cancellation Reason: Testing/Procedure Pt currently off unit for ultrasound. PT to continue to follow.        Second attempt at 1340: Pt eating lunch, requested for PT to follow up at a later time.        Signature:   Lennox Pippins, PT  12/05/2022  9:19 AM       (For scheduling questions, please contact rehab tech x [phone])

## 2022-12-05 NOTE — Plan of Care (Signed)
Problem: Pain interferes with ability to perform ADL  Goal: Pain at adequate level as identified by patient  Outcome: Progressing  Flowsheets (Taken 12/05/2022 2358)  Pain at adequate level as identified by patient:   Identify patient comfort function goal   Assess for risk of opioid induced respiratory depression, including snoring/sleep apnea. Alert healthcare team of risk factors identified.   Assess pain on admission, during daily assessment and/or before any "as needed" intervention(s)   Reassess pain within 30-60 minutes of any procedure/intervention, per Pain Assessment, Intervention, Reassessment (AIR) Cycle   Evaluate if patient comfort function goal is met   Offer non-pharmacological pain management interventions   Evaluate patient's satisfaction with pain management progress   Consult/collaborate with Physical Therapy, Occupational Therapy, and/or Speech Therapy     Problem: Side Effects from Pain Analgesia  Goal: Patient will experience minimal side effects of analgesic therapy  Outcome: Progressing  Flowsheets (Taken 12/05/2022 0314)  Patient will experience minimal side effects of analgesic therapy:   Monitor/assess patient's respiratory status (RR depth, effort, breath sounds)   Prevent/manage side effects per LIP orders (i.e. nausea, vomiting, pruritus, constipation, urinary retention, etc.)   Evaluate for opioid-induced sedation with appropriate assessment tool (i.e. POSS)   Assess for changes in cognitive function     Problem: Moderate/High Fall Risk Score >5  Goal: Patient will remain free of falls  Outcome: Progressing  Flowsheets (Taken 12/05/2022 2300)  High (Greater than 13):   HIGH-Consider use of low bed   HIGH-Bed alarm on at all times while patient in bed   HIGH-Visual cue at entrance to patient's room     Problem: Every Day - Stroke  Goal: Core/Quality measure requirements - Daily  Outcome: Progressing  Flowsheets (Taken 12/05/2022 2358)  Core/Quality measure requirements - Daily:   VTE  Prevention: Ensure anticoagulant(s) administered and/or anti-embolism stockings/devices documented by end of day 2   Ensure antithrombotic administered or contraindication documented by LIP by end of day 2   Once lipid panel has resulted, check LDL. Contact provider for statin order if LDL > 70 (or ensure contraindication documented by LIP).   Continue stroke education (must include Modifiable Risk Factors, Warning Signs and Symptoms of Stroke, Activation of Emergency Medical System and Follow-up Appointments). Ensure handout has been given and documented.  Goal: Neurological status is stable or improving  Outcome: Progressing  Flowsheets (Taken 12/05/2022 2358)  Neurological status is stable or improving:   Monitor/assess/document neurological assessment (Stroke: every 4 hours)   Re-assess NIH Stroke Scale for any change in status   Perform CAM Assessment   Observe for seizure activity and initiate seizure precautions if indicated   Monitor/assess NIH Stroke Scale  Goal: Stable vital signs and fluid balance  Outcome: Progressing  Flowsheets (Taken 12/05/2022 2358)  Stable vital signs and fluid balance:   Position patient for maximum circulation/cardiac output   Monitor and assess vitals every 4 hours or as ordered and hemodynamic parameters   Monitor intake and output. Notify LIP if urine output is < 30 mL/hour.   Apply telemetry monitor as ordered   Encourage oral fluid intake  Goal: Patient will maintain adequate oxygenation  Outcome: Progressing  Flowsheets (Taken 12/05/2022 2358)  Patient will maintain adequate oxygenation: Maintain SpO2 of greater than 92%  Goal: Patient's risk of aspiration will be minimized  Outcome: Progressing  Flowsheets (Taken 12/05/2022 2358)  Patient's risk of aspiration will be minimized:   Monitor/assess for signs of aspiration (tachypnea, cough, wheezing, clearing throat, hoarseness after eating,  decrease in SaO2   Assess and monitor ability to swallow   Keep head of bed up a minimum of 30  degrees when hemodynamically stable   Instruct patient to take small bites   Instruct patient to take small single sips of liquid   Supervise patient during oral intake  Goal: Nutritional intake is adequate  Outcome: Progressing  Flowsheets (Taken 12/05/2022 2358)  Nutritional intake is adequate: Consult/collaborate with Speech Therapy (swallow evaluations)  Goal: Mobility/Activity is maintained at optimal level for patient  Outcome: Progressing  Flowsheets (Taken 12/05/2022 2358)  Mobility/activity is maintained at optimal level for patient:   Encourage independent activity per ability   Consult/collaborate with Physical Therapy and/or Occupational Therapy  Goal: Skin integrity is maintained or improved  Outcome: Progressing  Flowsheets (Taken 12/05/2022 2358)  Skin integrity is maintained or improved:   Assess Braden Scale every shift   Turn or reposition patient every 2 hours or as needed unless able to reposition self   Increase activity as tolerated/progressive mobility   Keep skin clean and dry   Encourage use of lotion/moisturizer on skin   Relieve pressure to bony prominences   Monitor patient's hygiene practices   Avoid shearing   Utilize specialty bed   Collaborate with Wound, Ostomy, and Continence Nurse   Keep head of bed 30 degrees or less (unless contraindicated)  Goal: Neurovascular status is stable or improving  Outcome: Progressing  Flowsheets (Taken 12/05/2022 2358)  Neurovascular status is stable or improving:   Monitor/assess neurovascular status (pulses, capillary refill, pain, paresthesia, presence of edema)   Monitor/assess for signs of Venous Thrombus Emboli (edema of calf/thigh redness, pain)   Monitor/assess site of invasive procedure for signs of bleeding  Goal: Effective coping demonstrated  Outcome: Progressing  Flowsheets (Taken 12/05/2022 2358)  Effective coping demonstrated:   Assess/report to LIP uncontrolled anxiety, depression, or ineffective coping   Offer reassurance to decrease  anxiety  Goal: Will be able to express needs and understand communication  Outcome: Progressing  Flowsheets (Taken 12/05/2022 2358)  Able to express needs and understand communication: Consult/collaborate with Speech Language Pathology (SLP)

## 2022-12-05 NOTE — Progress Notes (Incomplete)
MEDICINE PROGRESS NOTE  [facility], DIVISION OF HOSPITALIST MEDICINE   [facility]   [place] Pager: [phone]      Date Time: 12/05/22 8:06 AM  Patient Name: Vanessa Kelly  Attending Physician: Liz Malady, MD  Hospital Day: 2    Active Hospital Problems    Diagnosis    Dizziness    Weakness       Subjective/ROS/24 hr events:   CC: Dizziness    HPI/Subjective: ***       Review of Systems:   Review of Systems - Negative except as above in HPI    Physical Exam:     Temp:  [97.5 F (36.4 C)-98.1 F (36.7 C)] 97.9 F (36.6 C)  Heart Rate:  [51-82] 60  Resp Rate:  [16-31] 18  BP: (120-231)/(50-93) 177/69  Intake and Output Summary-last 24 Hrs:  I/O last 3 completed shifts:  In: -   Out: 1750 [Urine:1750]  No data recorded by O2 Device: None (Room air)       General: WD female  in no acute distress,  Cardiovascular: regular rate and rhythm, no murmurs  Lungs: clear to auscultation bilaterally, no additional sounds  Abdomen: soft, non-tender, non-distended; normoactive bowel sounds  Extremities: no edema  Neurological: Alert and oriented X 3, moves all extremities.   Skin:no rash or lesion       Meds:   Medications were reviewed:  Scheduled Meds:  Current Facility-Administered Medications   Medication Dose Route Frequency    aspirin EC  81 mg Oral Daily    atorvastatin  10 mg Oral Daily    cetirizine  10 mg Oral Daily    enoxaparin  40 mg Subcutaneous Daily    fluticasone  2 spray Each Nare Daily    lidocaine  1 patch Transdermal Q24H    pantoprazole  40 mg Oral QAM AC    polyethylene glycol  17 g Oral Daily    ranolazine  500 mg Oral BID    venlafaxine  75 mg Oral QHS    [START ON 12/06/2022] vitamin D (ergocalciferol)  50,000 Units Oral Weekly    vitamins/minerals  1 tablet Oral Daily     Continuous Infusions:  PRN Meds:.acetaminophen, calcium carbonate, hydrALAZINE, labetalol, ondansetron **OR** ondansetron, sucralfate, traMADol  Labs/Radiology:   Imaging personally reviewed, including:  all available   MR Angiogram Neck W WO Contrast    Result Date: 12/04/2022  1.Stable MRI neck. 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 3.There is no stenosis of the proximal left internal carotid artery based on NASCET criteria. 4.The vertebral arteries are patent. Trilby Drummer, MD 12/04/2022 7:49 PM    MRI Brain W WO Contrast    Result Date: 12/04/2022    1. No acute process. 2. No mass, hydrocephalus, or pathologic fluid collection. 3. No acute infarct. 4. Chronic ischemic changes and age-appropriate volume are present. 5. 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 Trilby Drummer, MD 12/04/2022 7:49 PM    MR Angiogram Head WO Contrast    Result Date: 12/04/2022  1. Stable MRA head. 2. 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 3. Other than that, there is no significant finding. Trilby Drummer, MD 12/04/2022 7:49 PM    CT Cervical Spine without Contrast    Result Date: 12/04/2022  1. No cervical spine fracture is detected. 2. Prominent degenerative changes are seen throughout the cervical spine. 3. In the setting of advanced spondylosis  there can be occult fractures, disc injuries, or ligamentous injuries. 4. If there is a neurologic deficit, persistent pain, or a heightened clinical suspicion of occult injury, MRI is available for further assessment. Theodoro Doing, MD 12/04/2022 3:07 PM    CT Head WO Contrast    Result Date: 12/04/2022   1.No intracranial hemorrhage or acute intracranial findings. 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 Brenton Grills, MD 12/04/2022 1:22 PM   Recent Labs     12/04/22  1308   Whole Blood Glucose POCT 171*     Recent Labs   Lab 12/05/22  0601 12/04/22  1334   Sodium 139 136   Potassium 4.7 5.1   Chloride 107 104   BUN 19 24*   Creatinine 1.1* 1.4*   GFR 49.0* 36.7*   Glucose 108* 163*   Calcium 10.6* 11.0*     Recent Labs   Lab 12/05/22  0602 12/04/22  1334   WBC 5.02 5.92   Hemoglobin 10.3* 10.3*   Hematocrit 30.9*  31.3*   Platelet Count 172 177     Recent Labs   Lab 12/05/22  0601 12/04/22  1334   PT 11.5 11.3   INR 1.0 1.0   PTT 28 27     Recent Labs   Lab 12/04/22  1334   Alkaline Phosphatase 93   Bilirubin, Total 0.3   ALT 24   AST (SGOT) 22           Assessment and Plan :     This is a 82 y.o. 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  Patient states she has been having dizziness since Wednesday. States it is been fairly constant since then. She woke up today at 430AM and was walking when she fell. She is not sure if she passed out or not. Patient spoke with her daughter on the phone and felt her speech was slurred. Patient states that she feels like her speech is abnormal and she has to annunciate more than usual. Patient denies changes in vision. Per ems, facility staff reported pt has frequent falls related to syncope. [id number]. She c/o right frontal head pain and low back pain.      # Speech disturbance. 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)  -ECHO with bubble   -telemetry and neuro checks   -hold BP meds for permissive HTN  - neurology consulted by ED physician   - PT, OT , SLP  - morning lipid draw and A1C  - permissive HTN parameters     # CAD s/p CABG (2002), stents (2006)  # Hx of Right carotid artery stenosis  -hold BP meds for permissive HTN  -monitor BMP  -Continue home ASA and statin  -Followed by outpatient cardiologist [facility])    # T2DM, diet controlled   -12/2020 A1C 5.4%  -Not on any outpatient medical management  currently  --Check A1c    # CKD stage 3A  -Cr 1.4, appears to be above baseline per chart review, monitor BMP  -Avoid  NSAIDs/nephrotoxins, renally dose medications    # Orthostatic hypotension by hx   -Home meds: Florinef and midodrine with hold parameters   -BP in ED > 200 systolic. Home BP meds on hold as above    # GERD  -Continue home pantoprazole and PRN Tums     # Chronic constipation  -Continue home MiraLAX     # Anxiety/depression  -Continue home venlafaxine     # Osteoarthritis  # S/p right TKA  -Continue home PRN lidocaine patches     ---    DVT Prophylaxis:  Medication VTE Prophylaxis Orders: enoxaparin (LOVENOX) syringe 40 mg  Mechanical VTE Prophylaxis Orders: Maintain sequential compression device      Case was discussed with Pt ,RN    ---     I have spent 35 minutes with the patient, discussing plan of care, of which greater than 50% of the time was spent  directly with Pt care and updating family and on unit coordinating care, discussing with consultants.      Signed by: Liz Malady, MD

## 2022-12-05 NOTE — OT Eval Note (Signed)
Occupational Therapy Evaluation  Vanessa Kelly        Post Acute Care Therapy Recommendations:     Discharge Recommendations:  Home with home health OT, Home with supervision    DME needs IF patient is discharging home: Patient already has needed equipment    Therapy discharge recommendations may change with patient status.  Please refer to most recent note for up-to-date recommendations.         [facility]  [address]  [place], [place] [address]  [phone]    Occupational Therapy Evaluation    Patient: Vanessa Kelly MRN: [id number]   Unit: [facility] INTERMEDIATE CARE Bed: MI632/MI632-01    Time of treatment:   OT Received On: 12/05/22  Start Time: 1102  Stop Time: 1159  Time Calculation (min): 57 min       Consult received for Vanessa Kelly for OT evaluation and treatment.  Patient's medical condition is appropriate for Occupational Therapy  intervention at this time.  Assessment     Vanessa Kelly is a 82 y.o. female admitted 12/04/2022 from ALF with dizziness, frequent falls related to syncope.    At baseline, pt is mod-I for self-care, SPV for bathing, mod-I for ambulation household/ALF community level with rollator. Hx falls. Wears knee high ted hose at baseline.     This date, pt indep for bed mobility, SPV for sitting balance, CGA for standing balance, minA> CGA for fxnl tux and mobility in prep for ADL. Pt required set up bed level for G/H, indep for UB ADL, SBA for LB ADL and minA/SBA for toileting needs. Pt's indep with ADL is impacted by: impaired activity tolerance, balance, strength, vision, decr safety awareness, hemodynamics in positional changes.     Standardized tests and exams incorporated into evaluation include AMPAC self-care, balance, coordination, vision, mobility fxnl level, ROM/strength, and cognition. Pt demonstrates an evolving clinical presentation d/t hemodynamics. Pt is below baseline and would continue to benefit from skilled OT svcs to address identified  deficits, restore function and promote fxnl indep.    Therapeutic Intervention ( 42 Minutes):   Pt completed bed mobility from flat bed surface with indep, STS with RW minA/CGA with cues for hand placement/coordination of effort. LB ADL able to be completed with modified technique and SBA/cues for strategy/positioning. Orthostatic set gathered in session as outlined below. Positive supine>sit and sit>stand, recovering with time spent in stance. Pt denied associated symptoms. Pt ambulated limited household distance with RW and slow cadence with x2 standing RB for 50 feet. Noted impaired visual status primarily impacting periphery and subsequent safety. Education completed on compensatory strategies for mild visual impairment to periphery. Pt endorsed familiarity with practice pacing positional changes and allowing incr time in each position prior to advancement. Pt endorses utilizing compression stockings at baseline.      Brief chart review completed including review of labs, review of imaging, review of vitals, and review of past hospitalizations.  Pt's ability to complete ADLs and functional transfers is impaired due to the following deficits: decreased activity tolerance, decreased balance, dizziness/vertigo, gait impairment, decreased memory, decreased safety awareness, orthostatic blood pressure, decreased strength, transfers , and abnormal vital signs.  Pt demonstrates performance deficits with grooming, dressing, toileting, and functional mobility. There are a few comorbidities or other factors that affect plan of care and require modification of task including: assistive device needed for mobility, frequent falls, vertigo/dizziness, visual deficits, and lives alone.  Pt would continue to benefit from OT to address  these deficits and increase functional independence.        Complexity Chart Review Performance Deficits Clinical Decision Making Hx/Comorbidities Assistance needed   Low  Brief 1-3 Limited options  None None (or at baseline)       PMP - Progressive Mobility Protocol   PMP Activity: Step 7 - Walks out of Room  Distance Walked (ft) (Step 6,7): 50 Feet     Rehabilitation Potential: Good for goals    Interdisciplinary Communication: RN/PT/NP re. Pt status primarily orthostatic set and fxnl mobility     Plan     OT Plan  Risks/Benefits/POC Discussed with Pt/Family: With patient  Treatment Interventions: ADL retraining;Functional transfer training;Endurance training;Patient/Family training;Compensatory technique education  Discharge Recommendation: Home with home health OT;Home with supervision  DME Recommended for Discharge: Patient already has needed equipment  OT Frequency Recommended: 2-3x/wk         Medical Diagnosis: Dizziness [R42]  Weakness [R53.1]    History of Present Illness: Vanessa Kelly is a 82 y.o. female admitted on  12/04/2022 with "Presenting from ALF with dizziness, frequent falls related to syncope. 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), bilateral carotid artery stenosis, HLD, T2DM, CKD stage 3a, GERD, OA s/p right TKA, seasonal allergic rhinitis, and anxiety/depression and orthostatic hypotension on midodrine"      Problem List[1]  Medical History[2]    Past Surgical History:   Procedure Laterality Date    BREAST BIOPSY Right     2014    CORONARY ARTERY BYPASS      HYSTERECTOMY      partial-1970's       Tests/Labs:  Lab Results   Component Value Date/Time    HGB 10.3 (L) 12/05/2022 06:02 AM    HGB 10.4 (L) 06/28/2022 06:18 AM    HCT 30.9 (L) 12/05/2022 06:02 AM    HCT 31.8 (L) 06/28/2022 06:18 AM    K 4.7 12/05/2022 06:01 AM    K 4.5 06/28/2022 06:18 AM    NA 139 12/05/2022 06:01 AM    NA 140 06/28/2022 06:18 AM    INR 1.0 12/05/2022 06:01 AM    TROPI 13.0 12/05/2022 09:46 AM    TROPI 40.3 (H) 12/04/2022 01:34 PM    TROPI 6.7 06/23/2022 06:41 PM    TROPI <2.7 01/18/2022 05:15 PM    TROPI <0.01 01/21/2021 02:30 PM         Imaging:  MR Angiogram Neck W WO Contrast    Result Date:  12/04/2022  1.Stable MRI neck. 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 3.There is no stenosis of the proximal left internal carotid artery based on NASCET criteria. 4.The vertebral arteries are patent. Trilby Drummer, MD 12/04/2022 7:49 PM    MRI Brain W WO Contrast    Result Date: 12/04/2022    1. No acute process. 2. No mass, hydrocephalus, or pathologic fluid collection. 3. No acute infarct. 4. Chronic ischemic changes and age-appropriate volume are present. 5. 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 Trilby Drummer, MD 12/04/2022 7:49 PM    MR Angiogram Head WO Contrast    Result Date: 12/04/2022  1. Stable MRA head. 2. 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 3. Other than that, there is no significant finding. Trilby Drummer, MD 12/04/2022 7:49 PM    CT Cervical Spine without Contrast    Result Date: 12/04/2022  1. No cervical spine fracture is detected. 2. Prominent degenerative changes  are seen throughout the cervical spine. 3. In the setting of advanced spondylosis there can be occult fractures, disc injuries, or ligamentous injuries. 4. If there is a neurologic deficit, persistent pain, or a heightened clinical suspicion of occult injury, MRI is available for further assessment. Theodoro Doing, MD 12/04/2022 3:07 PM    CT Head WO Contrast    Result Date: 12/04/2022   1.No intracranial hemorrhage or acute intracranial findings. 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 Brenton Grills, MD 12/04/2022 1:22 PM      Social History: (Per family/care provider, if patient is not able to give accurate report)  Lives in ALF.  Equipment at home:  single point cane, rollator, walk in shower, and shower chair  Prior Level of Function:      Cognition: endorses recent (2-3 months) decline in memory, recall requiring assist with higher complexity IADL ie financial mgmt and medication mgmt    Mobility: mod-I with rollator, frequent breaks   Feeding:  indep   Grooming: indep   Bathing: SPV   Dressing: indep   Toileting: indep    Subjective   Patient is agreeable to participation in the therapy session. Nursing clears patient for therapy.  Patient's Goal:  prevent falls  Pain: no evidence of pain noted during session and Pt unable to quantify  Objective     Precautions:   Precautions  Other Precautions: Aspiration, Fall, Seizure    Patient is in bed with telemetry, PrimaFit (external female catheter), intravenous access, sequential compression device (SCD), and bed alarm in place.       Observation of patient/vitals: *orthostatic set done in session   12/05/22 1144 12/05/22 1145 12/05/22 1146   Vital Signs   Level of Consciousness Alert Alert Alert   Heart Rate  --   --   --    Heart Rate Source  --   --   --    BP 159/59 116/62 96/64   BP Location Right arm Right arm Right arm   BP Method Automatic Automatic Automatic   MAP (mmHg) 92 80 75   Patient Position Lying Sitting Standing      12/05/22 1149 12/05/22 1154   Vital Signs   Level of Consciousness Alert Alert   Heart Rate  --  77   Heart Rate Source  --  Monitor   BP 113/76 133/74   BP Location Right arm Right arm   BP Method Automatic Automatic   MAP (mmHg) 88 94   Patient Position Standing  (3 minutes) Standing  (5 minutes)       Orientation/Cognition:     Alert and Oriented x 4  Cognition: mild safety awareness deficit, would benefit from training. Reports progressive decline for the last few months re. Memory/recall      Musculoskeletal Examination:     ROM Strength   Neck/ Trunk WFL WFL   RUE WFL 3+/5   LUE WFL 4-/5         Sensation: Intact to light touch, denies numbness/tingling throughout BUE   Coordination: Intact gross motor and serial opposition to B hands, bradykinetic  Vision: Impaired periphery (25%); potential for nystagmus R eye in L gaze  Hearing: [facility]      Functional Mobility:    Supine to sit: indep  Sit to Supine: indep  Sit to stand: CGA/minA  Stand to sit: CGA/minA  Transfers:  CGA  Ambulation: CGA with RW    Balance:  Static Sit Balance: Good  Dynamic Sit  Balance: Good  Static Stand Balance: Good  Dynamic Stand Balance: Good    Self Care:  Feeding: Independent  Grooming: s/u  Bathing: SPV, (baseline performance)  UB Dressing: indep  LB Dressing: SBA  Toileting: SBA      Endurance: Good    Participation:  Good    Education:  Educated the patient/family/caregiver to role of occupational therapy, plan of care, goals  of therapy, rationale for progressing mobility and safety with mobility and ADLs, discharge instructions, and home safety.    RN notified of session outcome and that patient was left in semisupine with all needs met and equipment intact.   Safety measures include: handoff to nurse/clin tech/ unit secretary completed, bed alarm activated, oriented to call bell and placed within reach, personal items within reach, assistive device positioned out of reach, and bed placed in lowest position.   Mobility and ADL status posted at bedside and within E.M.R.        AM-PACT "6 Clicks" Daily Activity Inpatient Short Form  Inpatient AM-PACT Performed?: yes  Put On/Take Off Lower Body Clothing: 3  Assist with Bathing: 3  Assist with Toileting: 3  Put On/Take Off Upper Body Clothing: 4  Assist with Grooming: 3  Assist with Eating: 4  OT Daily Activity Raw Score: 20  CMS 0-100% Score: 38.32%       Goals:  Goals  Goal Formulation: Patient  Time For Goal Achievement: by time of discharge  Goals: Select goal  Patient will groom self: Independent (in stance at sink)  Patient will dress lower body: Modified Independent  Patient will toilet: Modified Independent    Signature:   Lulu Riding, OT  12/05/2022  1:43 PM    (For scheduling questions, please contact rehab tech [phone])      Attention MD:   Thank you for allowing [place] to participate in the care of Vanessa Kelly. Regulations from the [employer] for Medicare and Medicaid Services (CMS) require your review and approval of this plan of care.      Please cosign this note indicating you are in agreement with the Therapy Plan of Care so we may initiate the therapy treatment plan.         [1]   Patient Active Problem List  Diagnosis    Headache    Mastoiditis    Dizziness    Weakness   [2]   Past Medical History:  Diagnosis Date    Diabetes mellitus     Gastroesophageal reflux disease     Hyperlipidemia     Hypertension     Hypotension     Seasonal allergic rhinitis     Syncope

## 2022-12-05 NOTE — Plan of Care (Signed)
Problem: Pain interferes with ability to perform ADL  Goal: Pain at adequate level as identified by patient  Outcome: Progressing  Flowsheets (Taken 12/05/2022 0314)  Pain at adequate level as identified by patient:   Identify patient comfort function goal   Assess for risk of opioid induced respiratory depression, including snoring/sleep apnea. Alert healthcare team of risk factors identified.   Assess pain on admission, during daily assessment and/or before any "as needed" intervention(s)   Reassess pain within 30-60 minutes of any procedure/intervention, per Pain Assessment, Intervention, Reassessment (AIR) Cycle   Evaluate if patient comfort function goal is met   Offer non-pharmacological pain management interventions   Consult/collaborate with Physical Therapy, Occupational Therapy, and/or Speech Therapy   Include patient/patient care companion in decisions related to pain management as needed   Consult/collaborate with Pain Service   Evaluate patient's satisfaction with pain management progress     Problem: Side Effects from Pain Analgesia  Goal: Patient will experience minimal side effects of analgesic therapy  Outcome: Progressing  Flowsheets (Taken 12/05/2022 0314)  Patient will experience minimal side effects of analgesic therapy:   Monitor/assess patient's respiratory status (RR depth, effort, breath sounds)   Prevent/manage side effects per LIP orders (i.e. nausea, vomiting, pruritus, constipation, urinary retention, etc.)   Evaluate for opioid-induced sedation with appropriate assessment tool (i.e. POSS)   Assess for changes in cognitive function     Problem: Moderate/High Fall Risk Score >5  Goal: Patient will remain free of falls  Outcome: Progressing  Flowsheets (Taken 12/04/2022 2200)  High (Greater than 13):   HIGH-Consider use of low bed   HIGH-Visual cue at entrance to patient's room   HIGH-Bed alarm on at all times while patient in bed     Problem: Compromised Sensory Perception  Goal: Sensory  Perception Interventions  Outcome: Progressing  Flowsheets (Taken 12/04/2022 2016)  Sensory Perception Interventions: Offload heels, Pad bony prominences, Reposition q 2hrs/turn Clock, Q2 hour skin assessment under devices if present     Problem: Compromised Activity/Mobility  Goal: Activity/Mobility Interventions  Outcome: Progressing  Flowsheets (Taken 12/04/2022 2016)  Activity/Mobility Interventions: Pad bony prominences, TAP Seated positioning system when OOB, Promote PMP, Reposition q 2 hrs / turn clock, Offload heels     Problem: Day of Admission - Stroke  Goal: Core/Quality measure requirements - Admission  Outcome: Progressing  Flowsheets (Taken 12/05/2022 0314)  Core/Quality measure requirements - Admission:   Document NIH Stroke Scale on admission   Document nursing swallow/dysphagia screen on admission. If patient fails, keep patient NPO (follow your hospital protocol on swallowing screening).   VTE Prevention: Ensure anticoagulant(s) administered and/or anti-embolism stockings/devices documented as ordered   Ensure antithrombotic administered or contraindication documented by LIP   If diagnosis or history of Atrial Fib/Atrial Flutter, ensure oral anticoagulation is initiated or contraindication documented by LIP   Ensure lipid panel ordered   Begin stroke education on admission (must include Modifiable Risk Factors, Warning Signs and Symptoms of Stroke, Activation of Emergency Medical System and Follow-up Appointments) Ensure handout has been given and documented.   Ensure PT/OT and/or SLP ordered     Problem: Every Day - Stroke  Goal: Core/Quality measure requirements - Daily  Outcome: Progressing  Flowsheets (Taken 12/05/2022 0314)  Core/Quality measure requirements - Daily:   VTE Prevention: Ensure anticoagulant(s) administered and/or anti-embolism stockings/devices documented by end of day 2   Ensure antithrombotic administered or contraindication documented by LIP by end of day 2   Once lipid panel has  resulted, check  LDL. Contact provider for statin order if LDL > 70 (or ensure contraindication documented by LIP).   Continue stroke education (must include Modifiable Risk Factors, Warning Signs and Symptoms of Stroke, Activation of Emergency Medical System and Follow-up Appointments). Ensure handout has been given and documented.  Goal: Neurological status is stable or improving  Outcome: Progressing  Flowsheets (Taken 12/05/2022 0314)  Neurological status is stable or improving:   Monitor/assess/document neurological assessment (Stroke: every 4 hours)   Re-assess NIH Stroke Scale for any change in status   Perform CAM Assessment   Observe for seizure activity and initiate seizure precautions if indicated   Monitor/assess NIH Stroke Scale  Goal: Stable vital signs and fluid balance  Outcome: Progressing  Flowsheets (Taken 12/05/2022 0314)  Stable vital signs and fluid balance:   Position patient for maximum circulation/cardiac output   Monitor and assess vitals every 4 hours or as ordered and hemodynamic parameters   Monitor intake and output. Notify LIP if urine output is < 30 mL/hour.   Apply telemetry monitor as ordered   Encourage oral fluid intake  Goal: Patient will maintain adequate oxygenation  Outcome: Progressing  Flowsheets (Taken 12/05/2022 0314)  Patient will maintain adequate oxygenation: Maintain SpO2 of greater than 92%  Goal: Patient's risk of aspiration will be minimized  Outcome: Progressing  Flowsheets (Taken 12/05/2022 0314)  Patient's risk of aspiration will be minimized:   Complete new dysphagia screen for any change in status: Keep patient NPO if patient fails   Monitor/assess for signs of aspiration (tachypnea, cough, wheezing, clearing throat, hoarseness after eating, decrease in SaO2   Assess and monitor ability to swallow   Instruct patient to take small bites   Consult/Collaborate with Speech Pathologist for dysphagia   Supervise patient during oral intake   Instruct patient to take small  single sips of liquid  Goal: Nutritional intake is adequate  Outcome: Progressing  Flowsheets (Taken 12/05/2022 0314)  Nutritional intake is adequate: Consult/collaborate with Speech Therapy (swallow evaluations)  Goal: Mobility/Activity is maintained at optimal level for patient  Outcome: Progressing  Flowsheets (Taken 12/05/2022 0314)  Mobility/activity is maintained at optimal level for patient:   Encourage independent activity per ability   Consult/collaborate with Physical Therapy and/or Occupational Therapy  Goal: Skin integrity is maintained or improved  Outcome: Progressing  Flowsheets (Taken 12/05/2022 0314)  Skin integrity is maintained or improved:   Assess Braden Scale every shift   Increase activity as tolerated/progressive mobility   Relieve pressure to bony prominences   Avoid shearing   Turn or reposition patient every 2 hours or as needed unless able to reposition self   Keep skin clean and dry   Encourage use of lotion/moisturizer on skin   Monitor patient's hygiene practices   Keep head of bed 30 degrees or less (unless contraindicated)  Goal: Neurovascular status is stable or improving  Outcome: Progressing  Flowsheets (Taken 12/05/2022 0314)  Neurovascular status is stable or improving:   Monitor/assess neurovascular status (pulses, capillary refill, pain, paresthesia, presence of edema)   Monitor/assess for signs of Venous Thrombus Emboli (edema of calf/thigh redness, pain)   Monitor/assess site of invasive procedure for signs of bleeding  Goal: Will be able to express needs and understand communication  Outcome: Progressing  Flowsheets (Taken 12/05/2022 0314)  Able to express needs and understand communication: Consult/collaborate with Speech Language Pathology (SLP)

## 2022-12-05 NOTE — OT Progress Note (Signed)
[facility]  Occupational Therapy Attempt Note    Patient:  BRYTTNI WISSINK MRN#:  [id number]    Unit:  [facility] INTERMEDIATE CARE Room/Bed:  MI632/MI632-01      OT Cancellation: Visit  OT Visit Cancellation Reason: Testing/Procedure           Signature:   Lulu Riding, OT  12/05/2022  9:04 AM    (For scheduling questions, please contact rehab tech [phone])

## 2022-12-05 NOTE — Consults (Signed)
NEUROLOGY CONSULTATION    Date Time: 12/05/22 10:14 AM  Patient Name: Vanessa Kelly  Attending Physician: Liz Malady, MD      Assessment & Plan:   Recurrent dizziness - possibly from orthostasis.  Given slurred speech, underwent MRI brain - this shows nothing acute.    Check orthostatics - discussed with nurse. Can also do BP and pulse after 3 minutes of standing to see if symptoms are brought on by that.  PT/OT  Hydration.    History of Present Illness:   Asked by Dr. Dossie Der to consult on this 82 yo female with HTN, CAD, carotid artery stenosis, DM, CKD, orthostasis who reports period of dizziness and slurred speech. Onset was actually 5 days ago and has been persistent/intermittent. She thinks she might have passed out once when standing. She notes her legs will "shaking" sometimes when she is standing.  Daughter also felt her speech was slurred.  Pt denies focal weakness or numbness.    Past Medical History:     Past Medical History:   Diagnosis Date    Diabetes mellitus     Gastroesophageal reflux disease     Hyperlipidemia     Hypertension     Hypotension     Seasonal allergic rhinitis     Syncope        Meds:      Scheduled Meds: PRN Meds:    aspirin EC, 81 mg, Oral, Daily  atorvastatin, 10 mg, Oral, Daily  cetirizine, 10 mg, Oral, Daily  enoxaparin, 40 mg, Subcutaneous, Daily  fluticasone, 2 spray, Each Nare, Daily  lidocaine, 1 patch, Transdermal, Q24H  pantoprazole, 40 mg, Oral, QAM AC  polyethylene glycol, 17 g, Oral, Daily  ranolazine, 500 mg, Oral, BID  venlafaxine, 75 mg, Oral, QHS  [START ON 12/06/2022] vitamin D (ergocalciferol), 50,000 Units, Oral, Weekly  vitamins/minerals, 1 tablet, Oral, Daily        Continuous Infusions:   acetaminophen, 650 mg, Q6H PRN  calcium carbonate, 1 tablet, Q6H PRN  hydrALAZINE, 10 mg, Q3H PRN  labetalol, 10 mg, Q15 Min PRN  ondansetron, 4 mg, Q6H PRN   Or  ondansetron, 4 mg, Q6H PRN  sucralfate, 1 g, Q6H PRN  traMADol, 50 mg, Q8H PRN          I personally  reviewed all of the medications.  Medication list generated using all available resources.  Elder abuse (physical)  - negative  Advanced care plan - reviewed from chart or in discussion with pt or family    Allergies[1]    Social & Family History:   Social History[2]    Family History   Problem Relation Age of Onset    Heart failure Mother     Myocardial Infarction Father     Breast cancer Sister        Review of Systems:   No eye, ear nose, throat problems; no coughing or wheezing or shortness of breath, No chest pain or orthopnea, no abdominal pain, nausea or vomiting, No pain in the body or extremities, no psychiatric, neurological, endocrine, hematological or cardiac complaints except as noted above.     Physical Exam:   Blood pressure 177/69, pulse 60, temperature 97.9 F (36.6 C), temperature source Oral, resp. rate 18, height 1.727 m (5\' 8" ), weight 87.4 kg (192 lb 10.9 oz), SpO2 99%.    HEENT: Normocephalic. Non-icter, no congestion, no carotid bruits  Lungs:  CTA bil  Cardiac:  S1,S2, normal rate and rhythm  Neck: supple, no  lymphadenopathy, no thyromegaly, no JVD, no cartoid bruits  Extremities: no clubbing, cyanosis, or edema  Skin: no rashes or lesions noted    Neuro:  Level of consciousness:  Alert and appropriate  Oriented:  X 3  Cognition:  Intact naming, recognition, concentration and following complex commands  Cranial Nerves:  II-XII intact  Strength:  No upper extremity drift, 5/5 strength x 4 extremities  Coordination:  Intact FTN testing  Sensation: Intact x 4 extremities to LT, temp  Gait:  Deferred     Labs:     Recent Labs   Lab 12/05/22  0601 12/04/22  1334   Glucose 108* 163*   BUN 19 24*   Creatinine 1.1* 1.4*   Calcium 10.6* 11.0*   Sodium 139 136   Potassium 4.7 5.1   Chloride 107 104   CO2 28 28   Albumin 3.3* 3.6   Phosphorus 2.7  --    AST (SGOT)  --  22   ALT  --  24   Bilirubin, Total  --  0.3   Alkaline Phosphatase  --  93     Recent Labs   Lab 12/05/22  0602 12/04/22  1334   WBC  5.02 5.92   Hemoglobin 10.3* 10.3*   Hematocrit 30.9* 31.3*   MCV 99.0* 100.0*   MCH 33.0 32.9   MCHC 33.3 32.9   Platelet Count 172 177         Recent Labs     12/05/22  0601 12/04/22  1334   PTT 28 27   PT 11.5 11.3   INR 1.0 1.0          Radiology Results (24 Hour)       Procedure Component Value Units Date/Time    MR Angiogram Neck W WO Contrast [[id number]] Collected: 12/04/22 1942    Order Status: Completed Updated: 12/04/22 1951    Narrative:      HISTORY: Dizziness    COMPARISON: MRA neck 06/25/2022    TECHNIQUE: MR angiogram of the neck performed on a 1.5 Tesla scanner  without and with intravenous contrast. 3D maximum intensity projection  images were created and reviewed. Internal carotid stenosis was determined  based on NASCET criteria.    CONTRAST: IntravenousClariscan 20 cc    FINDINGS:   The great vessels originate from the aortic arch.    The right common carotid artery is patent. The right carotid bulb and ICA  are completely occluded, as before.    The left common carotid artery is patent.  There is no stenosis of the  proximal left internal carotid artery. The more distal cervical left  internal carotid artery is patent.    The right vertebral artery is dominant. The cervical vertebral arteries are  patent.          Impression:        1.Stable MRI neck.  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   3.There is no stenosis of the proximal left internal carotid artery based  on NASCET criteria.  4.The vertebral arteries are patent.    Trilby Drummer, MD  12/04/2022 7:49 PM    MRI Brain W Ilda Basset Contrast [[id number]] Collected: 12/04/22 1935    Order Status: Completed Updated: 12/04/22 1951    Narrative:      HISTORY: Dizziness    COMPARISON: MRI 06/23/2022    TECHNIQUE: MRI of the brain performed on a 1.5 Tesla scanner without and  with intravenous  contrast. Examination performed per routine brain imaging  protocol.     CONTRAST: IntravenousClariscan 20 cc    FINDINGS:    There are no abnormal fluid collections. There are no masses. There is no  mass effect or midline shift. Ventricles and sulci are age-appropriate.  Chronic ischemic changes are present. Diffusion-weighted imaging is  unremarkable. There is no evidence of acute infarction. There is a stable  old cortical infarct in the right parietal high convexity. Clival and  calvarial marrow signal is normal. Upon the administration of intravenous  contrast, there is no abnormal enhancement.    Right maxillary sinus atelectasis is present secondary to chronic mucosal  inflammatory thickening.          Impression:          1. No acute process.  2. No mass, hydrocephalus, or pathologic fluid collection.  3. No acute infarct.  4. Chronic ischemic changes and age-appropriate volume are present.  5. 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    Trilby Drummer, MD  12/04/2022 7:49 PM    MR Angiogram Head WO Contrast [[id number]] Collected: 12/04/22 1945    Order Status: Completed Updated: 12/04/22 1951    Narrative:      HISTORY: Dizziness    COMPARISON: MRI brain 06/25/2022    TECHNIQUE: MR angiogram of the head performed on a 1.5 Tesla scanner  without intravenous contrast.  3D maximum intensity projection images were  created and reviewed.     CONTRAST: None.    FINDINGS:   The terminal left ICA segments are patent. On the right side, the ICA is  again noted to be completely occluded; the supraclinoid segment is  reconstituted.    The right anterior and middle cerebral arteries are patent, supplied via  Circle-of-Willis collateral vessels, including anterior and posterior  communicating arteries. The left MCA is unremarkable. The left A1 segment  is atretic; the A2 segment is patent.    Posteriorly, the intradural vertebral arteries, basilar artery, and PCA's  are unremarkable.    No aneurysm is identified.          Impression:        1. Stable MRA head.  2. The right ICA is occluded but is reconstituted in its supraclinoid  segment.  3.  Other than that, there is no significant finding.    Trilby Drummer, MD  12/04/2022 7:49 PM    CT Cervical Spine without Contrast [[id number]] Collected: 12/04/22 1458    Order Status: Completed Updated: 12/04/22 1509    Narrative:      HISTORY: Neck pain. The patient fell and has posttraumatic right-sided leg  pain and weakness.    COMPARISON: MRI of the cervical spine dated January 26, 2021 is  available. CT of the cervical spine dated October 14, 2021 is available    TECHNIQUE: CT of the cervical spine performed without intravenous contrast.  Multiplanar reformatted images were created and reviewed. The following  dose reduction techniques were utilized: automated exposure control and/or  adjustment of the mA and/or KV according to patient size, and the use of an  iterative reconstruction technique.    FINDINGS: The 7 cervical vertebral bodies are aligned.  No cervical spine  fracture is detected.     The facet joints show degenerative change bilaterally at each level.    There are degenerative changes of the intervertebral disc  with ridges of  disc protrusion (small ridges of disc herniation)  and osteophyte at each  disc level of  the cervical spine.      There is a reversal of the curvature of the cervical spine which may be   pre-existing ( and due to degenerative change), due to patient positioning,   muscle spasm, or ligamentous injury.    There is opacification of the inferior right mastoid air cells with  thickening and sclerosis of the bone of the right mastoid tip suggesting  chronic inflammatory change.       Impression:        1. No cervical spine fracture is detected.   2. Prominent degenerative changes are seen throughout the cervical spine.   3. In the setting of advanced spondylosis there can be occult fractures,  disc injuries, or ligamentous injuries.   4. If there is a neurologic deficit, persistent pain, or a heightened  clinical suspicion of occult injury, MRI is available for  further  assessment.      Theodoro Doing, MD  12/04/2022 3:07 PM    CT Head WO Contrast [[id number]] Collected: 12/04/22 1318    Order Status: Completed Updated: 12/04/22 1324    Narrative:      HISTORY: Neuro deficit, acute, stroke suspected. facial droop, dizziness,.     COMPARISON: CT head 06/23/2022    TECHNIQUE: CT of the head performed without intravenous contrast. The  following dose reduction techniques were utilized: automated exposure  control and/or adjustment of the mA and/or KV according to patient size,  and the use of an iterative reconstruction technique.    CONTRAST: None.    FINDINGS:  No midline shift, mass effect, parenchymal hemorrhage, or evidence of acute  territorial infarction. There is an unchanged small chronic infarct in the  right postcentral gyrus.    No evidence of hydrocephalus. No extra-axial fluid collections.    There is chronic right maxillary sinus mucosal disease with hyperostosis.  The mastoids are clear. The calvarium is intact.      Impression:         1.No intracranial hemorrhage or acute intracranial findings.  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    Brenton Grills, MD  12/04/2022 1:22 PM             All recent brain and spine imaging (MRI, CT) results reviewed.    Chart reviewed    Code status confirmed    Case discussed with: patient and Dr. Kathi Ludwig    75 minutes;  involving time spent examining patient, in counseling or coordination of care, reviewing test results, and in documentation.    Signed by: Ardelle Anton, MD  Spectralink: [phone]       Answering Service: [phone]         [1]   Allergies  Allergen Reactions    Fentanyl Anaphylaxis     Hives    Percolone [Oxycodone]    [2]   Social History  Socioeconomic History    Marital status: Married   Tobacco Use    Smoking status: Former     Current packs/day: 0.00     Types: Cigarettes     Quit date: 1990     Years since quitting: 34.6    Smokeless tobacco: Never   Vaping Use    Vaping status: Never Used    Substance and Sexual Activity    Alcohol use: Yes     Comment: occasionally    Drug use: Never     Social Determinants of Health     Financial Resource Strain: Low Risk  (06/24/2022)  Overall Financial Resource Strain (CARDIA)     Difficulty of Paying Living Expenses: Not hard at all   Food Insecurity: No Food Insecurity (12/04/2022)    Hunger Vital Sign     Worried About Running Out of Food in the Last Year: Never true     Ran Out of Food in the Last Year: Never true   Transportation Needs: No Transportation Needs (12/04/2022)    PRAPARE - Therapist, art (Medical): No     Lack of Transportation (Non-Medical): No   Intimate Partner Violence: Not At Risk (12/04/2022)    Humiliation, Afraid, Rape, and Kick questionnaire     Fear of Current or Ex-Partner: No     Emotionally Abused: No     Physically Abused: No     Sexually Abused: No   Housing Stability: Low Risk  (12/04/2022)    Housing Stability Vital Sign     Unable to Pay for Housing in the Last Year: No     Number of Times Moved in the Last Year: 1     Homeless in the Last Year: No

## 2022-12-05 NOTE — Nursing Progress Note (Signed)
The learning abilities of the patient and/or caregiver have been assessed. Today's individualized plan of care includes NIH, neuro checks, neurology consult, safety  . The patient or caregiver states the following personal goal related to the patient's deficit(s): "By discharge I want to be able to talk normally ." The plan of care was discussed with the patient and/or caregiver, who agrees to it and demonstrates understanding of the disease process, risk factors, treatment plan, medications and consequences of noncompliance. All questions and concerns were addressed.

## 2022-12-05 NOTE — Progress Notes (Signed)
MEDICINE PROGRESS NOTE  [facility], DIVISION OF HOSPITALIST MEDICINE   [facility]   [place] Pager: [phone]      Date Time: 12/05/22 3:51 PM  Patient Name: Vanessa Kelly  Attending Physician: Liz Malady, MD  Hospital Day: 2    Active Hospital Problems    Diagnosis    Dizziness    Weakness             Assessment and Plan :     MARYJO GIRARDI is a 82 y.o. 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  Patient states she has been having dizziness since Wednesday. States it is been fairly constant since then. She woke up Saturday morning at  430AM and was walking to the bathroom when she fell. She is not sure if she passed out or not, But she felt dizzy and next thing she knew she was sitting on the floor, thinks she hit her head on a nightstand.Patient spoke with her daughter on the phone and felt her speech was slurred. Patient states that she feels like her speech is abnormal and she has to annunciate more than usual. Per ems, facility staff reported pt has frequent falls related to syncope.  She states she has previously seen neurology and was told that she has some sort of parkinsonian disorder, but she does not agree with this diagnosis.  She is not taking Florinef, however she takes midodrine when her blood pressure is low, however she has labile blood pressure with hypertension while supine and she takes candesartan for that.  She does endorse double vision for the past week, she went to the ophthalmologist and got new glasses, and was told she has 20/20 vision.  She also has lost peripheral vision in her right eye which is intermittent, she states, " I cannot see people in the  approach me on the right side or see my cup of coffee sitting next to me for the past 1-2 weeks"  She does endorse feeling very tired and fatigued after her syncopal episode and sleeping all day, but denies any bowel or bladder incontinence, tongue biting     # syncope/fall/dizziness likely related to ortho stasis   #Speech disturbance. Stroke ruled out   #loss of peripheral vision in right eye  #double vision intermittently   #orthostatic hypotension with supine htn  -continue ASA and statin  -MRI brain did not show any acute process, no mass or hydrocephalus, no acute infarct.  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  -MRA head and neck showing complete occlusion of the right carotid bulb and right internal carotid artery, per patient this is chronic, there is no stenosis of the proximal left internal carotid artery.  Vertebral arteries are patent.  -ECHO fairly unremarkable, preserved ejection fraction 65 to 70%, concentric left ventricular remodeling, aortic sclerosis with trace regurgitation, technically limited bubble study appears minimally positive, however does not appear clinically significant after discussion with neurology.  Otherwise no significant valvular abnormalities or pathology to explain her syncopal episode.  -Orthostatic blood pressures positive, systolic blood pressure dropped from 159 to 96 upon standing after 3 minutes (please see OT note for specific  details).   -telemetry reviewed, no events overnight. -continue neuro checks   - neurology following, appreciate recommendations.   - PT, OT , SLP  -A1c 5.7, lipid panel TC 159, TG 69, HDL 75, LDL 70  -check am cortisol tomorrow   -compression stockings, abdominal binder upon standing, hob 45 degrees while awake. Non pharmacological tx recommended given pt has supine htn.  -follow up with ophthalmology outpatient for the peripheral vision loss and double vision, per neuro no  other imaging (ie. Orbit MRI or MRV ) is indicated at this time.   -CT cervical spine with DJD but no acute fracture, CTH neg for acute bleeding/intracranial pathology.          CAD s/p CABG (2002), stents (2006)  Bilateral carotid artery stenosis  -resume home candesartan.   -monitor BMP  -Continue home ASA and statin  -Followed by outpatient cardiologist [facility])       Chronic macrocytic anemia  -H/H stable on admission, no signs of active bleeding on exam, monitor CBC  -Continue to monitor.     T2DM, diet controlled   -12/2020 A1C 5.4%  -Not on any outpatient medical management currently     AKI on CKD stage 3A- resolved   -Cr down from  1.4 to 1.1 , back to baseline.   -continue to monitor bmp.  -Avoid NSAIDs/nephrotoxins, renally dose medications     GERD  -Continue home pantoprazole and PRN Tums     Chronic constipation  -Continue home MiraLAX     Seasonal allergic rhinitis  -Consider Zyrtec (substitute for home Allegra), Flonase     Anxiety/depression  -Continue home venlafaxine     Osteoarthritis  S/p right TKA  -Continue home PRN lidocaine patches     Orthostatic hypotension by hx   On midodrine with hold parameters   BP in ED > 200 systolic.   -resume home bp meds.   -abdominal binder while ambulating /out of bed, compression stockings , hob > 45 degrees while awake, ensure adequate hydration.      DVT Prophylaxis:  Current Facility-Administered Medications (Includes Only Anticoagulants, Misc. Hematological)   Medication Dose Route Last Admin    enoxaparin (LOVENOX) syringe 40 mg  40 mg Subcutaneous 40 mg at 12/05/22 1534             Foley/ lines- PIV     I spent 60 minutes for this Pt including coordination of care for conditions described in my assessment and plan.       Case was discussed with Pt ,RN, Dr. Kathi Ludwig, Dr. Moise Boring      Disposition:     Today's date: 12/05/2022  Length of Stay: 0  Anticipated medical stability for discharge: <2  midnights   Reason for ongoing hospitalization: pending  clinical improvement, orthostatic hypotension.  Lines:     Patient Lines/Drains/Airways Status       Active PICC Line / CVC Line / PIV Line / Drain / Airway / Intraosseous Line / Epidural Line / ART Line / Line / Wound / Pressure Ulcer / NG/OG Tube       Name Placement date Placement time Site Days    Peripheral IV 12/04/22 22 G Left Forearm 12/04/22  1332  Forearm  1    External Urinary Catheter 12/04/22  1700  --  less than 1                     Subjective/ROS/24 hr events:   CC: Dizziness  Interval History/24 hour events: reviewed, no events on telemetry overnight. Orthostatic bp + , MRI neg for acute stroke. Continue abd binder while ambulating, compression stockings, hob  elevated, recheck bmp in the morning, likely [place] back to [place] tomorrow.   Adult diet Therapeutic/ Modified; Solid; Regular (IDDSI level 7); Thin (IDDSI level 0); Consistent carbohydrate and heart healthy  HPI/Subjective: right peripheral vision loss intermittently, double vision intermittently. Currently denies dizziness but has not been ambulating much.        Review of Systems:   Review of Systems - Negative except as above in HPI    Physical Exam:     Temp:  [97.3 F (36.3 C)-98.1 F (36.7 C)] 97.3 F (36.3 C)  Heart Rate:  [55-82] 59  Resp Rate:  [16-23] 16  BP: (96-231)/(50-91) 153/70  Intake and Output Summary-last 24 Hrs:  I/O last 3 completed shifts:  In: -   Out: 1750 [Urine:1750]  No data recorded by O2 Device: None (Room air)       General: WD female  in no acute distress,  Cardiovascular: regular rate and rhythm, no murmurs  Lungs: clear to auscultation bilaterally, no additional sounds  Abdomen: soft, non-tender, non-distended; normoactive bowel sounds  Extremities: no edema  Neurological: Alert and oriented X 3, moves all extremities.   Skin:no rash or lesion  Foley:  Central line:       Meds:   Medications were reviewed:  Scheduled Meds:  Current Facility-Administered Medications   Medication Dose Route Frequency    aspirin EC   81 mg Oral Daily    atorvastatin  10 mg Oral Daily    cetirizine  10 mg Oral Daily    enoxaparin  40 mg Subcutaneous Daily    fluticasone  2 spray Each Nare Daily    lidocaine  1 patch Transdermal Q24H    pantoprazole  40 mg Oral QAM AC    polyethylene glycol  17 g Oral Daily    ranolazine  500 mg Oral BID    venlafaxine  75 mg Oral QHS    [START ON 12/06/2022] vitamin D (ergocalciferol)  50,000 Units Oral Weekly    vitamins/minerals  1 tablet Oral Daily     Continuous Infusions:  PRN Meds:.acetaminophen, calcium carbonate, hydrALAZINE, labetalol, ondansetron **OR** ondansetron, sucralfate, traMADol    Labs/Radiology:   Imaging personally reviewed, including: all available   MR Angiogram Neck W WO Contrast    Result Date: 12/04/2022  1.Stable MRI neck. 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 3.There is no stenosis of the proximal left internal carotid artery based on NASCET criteria. 4.The vertebral arteries are patent. Trilby Drummer, MD 12/04/2022 7:49 PM    MRI Brain W WO Contrast    Result Date: 12/04/2022    1. No acute process. 2. No mass, hydrocephalus, or pathologic fluid collection. 3. No acute infarct. 4. Chronic ischemic changes and age-appropriate volume are present. 5. 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 Trilby Drummer, MD 12/04/2022 7:49 PM    MR Angiogram Head WO Contrast    Result Date: 12/04/2022  1. Stable MRA head. 2. 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 3. Other than that, there is no significant finding. Trilby Drummer, MD 12/04/2022 7:49 PM   Recent Labs     12/05/22  0818   Whole Blood Glucose POCT 106*     Recent Labs   Lab 12/05/22  0601 12/04/22  1334  Sodium 139 136   Potassium 4.7 5.1   Chloride 107 104   BUN 19 24*   Creatinine 1.1* 1.4*   GFR 49.0* 36.7*   Glucose 108* 163*   Calcium 10.6* 11.0*     Recent Labs   Lab 12/05/22  0602 12/04/22  1334   WBC 5.02 5.92   Hemoglobin 10.3* 10.3*   Hematocrit 30.9* 31.3*   Platelet  Count 172 177     Recent Labs   Lab 12/05/22  0601 12/04/22  1334   PT 11.5 11.3   INR 1.0 1.0   PTT 28 27     Recent Labs   Lab 12/04/22  1334   Alkaline Phosphatase 93   Bilirubin, Total 0.3   ALT 24   AST (SGOT) 22             Signed by: Charlott Rakes, NP

## 2022-12-05 NOTE — UM Notes (Signed)
Admit to Observation [[id number]]    Electronically signed by: Rogelio Seen, MD on 12/04/22 1433 Status: Completed   Ordering user: Rogelio Seen, MD 12/04/22 1433 Ordering provider: Rogelio Seen, MD   Authorized by: Rogelio Seen, MD   Cosigning events  Electronically cosigned by Arlyn Dunning, MD 12/04/22 1434 for Ordering       82 y.o. female who presented to ED from assisted living facility, having dizziness since Wednesday.     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 She woke up today at 430AM and was walking when she fell. She is not sure if she passed out or not. Patient spoke with her daughter on the phone and felt her speech was slurred. Patient states that she feels like her speech is abnormal and she has to annunciate more than usual.     ED meds: LR 1L    8/4 Medicine plan:  Speech disturbance. Concern for stroke  -ASA and statin  -MRI brain  -MRA head and neck    -ECHO with bubble   -telemetry and neuro checks   -hold BP meds for permissive HTN  - neurology consulted by ED physician   - PT, OT , SLP  - morning lipid draw and A1C  - permissive HTN parameters      CAD s/p CABG (2002), stents (2006)  Bilateral carotid artery stenosis  -hold BP meds for permissive HTN  -monitor BMP  -Continue home ASA and statin  -Followed by outpatient cardiologist [facility])       Chronic macrocytic anemia  -H/H stable on admission, no signs of active bleeding on exam, monitor CBC  -Continue to monitor.     T2DM, diet controlled   -12/2020 A1C 5.4%  -Not on any outpatient medical management currently     CKD stage 3A  -Cr 1.4, appears to be above baseline per chart review, monitor BMP  -Avoid NSAIDs/nephrotoxins, renally dose  medications     GERD  -Continue home pantoprazole and PRN Tums     Chronic constipation  -Continue home MiraLAX     Seasonal allergic rhinitis  -Consider Zyrtec (substitute for home Allegra), Flonase     Anxiety/depression  -Continue home venlafaxine     Osteoarthritis  S/p right TKA  -Continue home PRN lidocaine patches     Orthostatic hypotension by hx   On midodrine with hold parameters   BP in ED > 200 systolic. Home BP meds on hold as above    Nutrition:No diet orders on file     Code status: Full code      Status/Disposition:   Pt is admitted under OBSERVATION with above concerns.    Anticipated medical stability for discharge: 24 Hrs       MRI brain:  1. No acute process.   2. No mass, hydrocephalus, or pathologic fluid collection.   3. No acute infarct.   4. Chronic ischemic changes and age-appropriate volume are present.   5. 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     MRA head:   1. Stable MRA head.   2. 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   3. Other than that, there is no significant finding.  MRA neck:  1.Stable MRI neck.   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   3.There is no stenosis of the proximal left internal carotid artery based on NASCET criteria.   4.The vertebral arteries are patent.              12/04/22 13:08 12/04/22 13:34 12/05/22 06:01 12/05/22 06:02 12/05/22 08:18   Hemoglobin  10.3 (L)  10.3 (L)    Hematocrit  31.3 (L)  30.9 (L)    RBC  3.13 (L)  3.12 (L)    MCV  100.0 (H)  99.0 (H)    Glucose  163 (H) 108 (H)     Whole Blood Glucose POCT 171 (H)    106 (H)   BUN  24 (H) 19     Creatinine  1.4 (H) 1.1 (H)     Calcium  11.0 (H) 10.6 (H)     Anion Gap  4.0 (L) 4.0 (L)     EGFR  36.7 (L) 49.0 (L)     Albumin  3.6 3.3 (L)     hs Troponin-I  40.3 (H)      Hemoglobin A1C   5.7 (H)            Gillian Scarce, MSN, RN  [facility], Revenue Cycle Department, Utilization Review  Phone [phone]    Fax [phone]   [email or web address]

## 2022-12-05 NOTE — Nursing Progress Note (Signed)
4 eyes in 4 hours pressure injury assessment note:      Completed with: Jomarie Longs RN  Unit & Time admitted: 6B @2022              Bony Prominences: Check appropriate box; if wound is present enter wound assessment in LDA     Occiput:                 [x] WNL  []  Wound present  Face:                     [x] WNL  []  Wound present  Ears:                      [x] WNL  []  Wound present  Spine:                    [x] WNL  []  Wound present  Shoulders:             [x] WNL  []  Wound present  Elbows:                  [x] WNL  []  Wound present  Sacrum/coccyx:     [x] WNL  []  Wound present  Ischial Tuberosity:  [x] WNL  []  Wound present  Trochanter/Hip:      [x] WNL  []  Wound present  Knees:                   [x] WNL  []  Wound present  Ankles:                   [x] WNL  []  Wound present  Heels:                    [x] WNL  []  Wound present  Other pressure areas:  []  Wound location       Device related: []  Device name:         LDA completed if wound present: yes/no  Consult WOCN if necessary    Other skin related issues, ie tears, rash, etc, document in Integumentary flowsheet

## 2022-12-05 NOTE — Nursing Progress Note (Signed)
The learning abilities of the patient and/or caregiver have been assessed. Today's individualized plan of care includes neuro checks with vs done q4, NIH, permissive HTN VS, and safety. The patient or caregiver states the following personal goal related to the patient's deficit(s): "By discharge I want to be able to talk normal." The plan of care was discussed with the patient and/or caregiver, who agrees to it and demonstrates understanding of the disease process, risk factors, treatment plan, medications and consequences of noncompliance. All questions and concerns were addressed.

## 2022-12-05 NOTE — SLP Eval Note (Signed)
[facility]  [address]  [place], [place] [address]  [phone]    SPEECH LANGUAGE COGNITIVE SWALLOW EVALUATION    Patient: Vanessa Kelly    MRN#: [id number]    Date/Time of Evaluation:   SLP Received On: 12/05/22  Start Time: 1003  Stop Time: 1057  Time Calculation (min): 54 min    Referring Physician: Arlyn Dunning, MD   Date of Referral: 12/04/22  Interpreter utilized: no, not indicated  Therapist PPE during session procedural mask and gloves     Assessment and Clinical Impression   Vanessa Kelly is a 82 y.o. 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     Vanessa Kelly presents with frequent eructation s/p PO trials, and was unable to complete 3 oz water challenge 2/2 needing to burp. No overt s/sx of aspiration were observed during this evaluation; though, patient endorses intermittent coughing/throat clearing with PO throughout the day and reports recurrent PNA (~annually) hx (last one being in October 2023). Oral mech exam significant for R facial droop and labial asymmetry.    Vanessa Kelly endorsing intermittent word finding difficulties, and feeling "slow". She also reports that she's had difficulty completing ADLs (e.g., med and financial management) "efficiently" earlier this year, and her daughter has been assisting her with these higher level cognitive tasks starting in June 2024. Patient reports that she has difficulty sustaining tasks and feels like it takes her a lot longer to "get through these things". Patient demonstrated mild difficulty with immediate/delayed recall, mod deficits with problem solving, executive functioning  tasks, and digit span attention.     Patient would benefit from speech/language assessment per reported difficulty with word-finding. Consider VFSS per patient's report of recurrent PNA and intermittent dysphagia symptoms during PO. She would benefit from ongoing higher level cog-communication assessment and tx at next level of care, where functional tasks and higher level skills can be targeted. Will follow x 1-2 for ongoing assessment and follow up.   Plan   Plan: begin/continue oral diet, VFSS, patient/family education (cog-communication)  SLP Frequency Recommended: 1-2x/wk  Diet Solids Recommendation: Continue with current diet, Regular (IDDSI RG7)  Diet Liquids Recommendations: Thin Liquids (IDDSI TN0)  Precautions/Compensations: Awake/alert, Upright 90 degrees for all oral intake, Alternate solids and liquids, Small bites/sips, Eat/feed slowly  Recommended Form of Meds: PO, with liquid (as tolerated)  Suggestions for Feeding: independent  Recommendations: Outpatient speech  Recommendation Discussed With: : Patient, Physician, Nurse, Posted bedside, Family/Caregiver  Referral(s):: Neurologist    Goals:   Patient will participate in ongoing dynamic assessment of cog-communication, speech/language function (new)  Vanessa Kelly will participate in VFSS to define swallow physiology & therapy program (new)    Rehabilitation Prognosis: good due to family/community support, participation, and PLOF  History     Prior Speech Therapy Intervention: None per chart review or per patient report    Social History: Received from patient and daughter  Baseline Diet: Regular solids/thin liquids  Feeding: +self  Hearing: needs hearing aids, saw audiologist and received quote but has not yet decided  Vision:+glasses  Cognition: WFLs until a couple of months ago, feels like she can't complete tasks efficiently  and difficulty attending    Dysphagia Related Hx:   Dysphagia: reports cough  Recurrent PNA: yes  Unintended weight loss: no  Reflux:  yes, on meds    Imaging:  MR Angiogram Neck W WO Contrast    Result Date: 12/04/2022  1.Stable MRI neck. 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 3.There is no stenosis of the proximal left internal carotid artery based on NASCET criteria. 4.The vertebral arteries are patent. Trilby Drummer, MD 12/04/2022 7:49 PM    MRI Brain W WO Contrast    Result Date: 12/04/2022    1. No acute process. 2. No mass, hydrocephalus, or pathologic fluid collection. 3. No acute infarct. 4. Chronic ischemic changes and age-appropriate volume are present. 5. 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 Trilby Drummer, MD 12/04/2022 7:49 PM    MR Angiogram Head WO Contrast    Result Date: 12/04/2022  1. Stable MRA head. 2. 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 3. Other than that, there is no significant finding. Trilby Drummer, MD 12/04/2022 7:49 PM    CT Cervical Spine without Contrast    Result Date: 12/04/2022  1. No cervical spine fracture is detected. 2. Prominent degenerative changes are seen throughout the cervical spine. 3. In the setting of advanced spondylosis there can be occult fractures, disc injuries, or ligamentous injuries. 4. If there is a neurologic deficit, persistent pain, or a heightened clinical suspicion of occult injury, MRI is available for further assessment. Theodoro Doing, MD 12/04/2022 3:07 PM    CT Head WO Contrast    Result Date: 12/04/2022   1.No intracranial hemorrhage or acute intracranial findings. 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 Brenton Grills, MD 12/04/2022 1:22 PM      Medical Diagnosis: Dizziness [R42]  Weakness [R53.1]    History of Present Illness: Vanessa Kelly is a 82 y.o. female admitted on 12/04/2022 with   Problem List[1]     Past Medical/Surgical History  Medical History[2]   Past Surgical History:   Procedure Laterality Date    BREAST BIOPSY Right     2014    CORONARY ARTERY BYPASS      HYSTERECTOMY      [phone]          Subjective   Patient is agreeable to participation in the therapy session. Nursing clears patient for therapy. Patient's medical condition is  appropriate for speech therapy intervention at this time.  Patient's goal: "I know I'm so popular"  Pain: Vanessa Kelly denies pain     Objective     Respiratory Status  Current Status  Respiratory Status: room air  Behavior/Mental Status: Awake/alert, Able to follow directions  Nutrition: oral  Diet Prior to Study: Regular (IDDSI RG7), Thin Liquids (IDDSI TN0)    Oral Motor Skills  Oral Care: Good    Dentition: Normal  Oral Sensation: WFL  Mucosal Quality: Clear/moist  Secretion Management: WFL    Cranial Nerve Exam  CNV Trigeminal (sensation to face, mandibular movements, chewing):   Intact bilaterally  CNVII Facial (taste to anterior 2/3 tongue, raise eyebrows/wrinkle forehead, smile/pucker, puff cheeks with air):   Right facial droop and Right labial asymmetry   CNIX Glossopharyngeal (taste to posterior 1/3 tongue, gag response, velar elevation):   Intact bilaterally  CN X Vagus (sensation to larynx/pharynx, cough reflex, laryngeal function, vocal quality):   Unable to r/o CNX involvement due to presence of dysphonia   CNXII Hypoglossal (lingual  strength, movement, symmetry):   Intact bilaterally    Deglutition Skills  Position: upright 90 degrees  Food(s) Tested: Thin (IDDSI TN0);Via spoon;Via cup;Via straw;Pureed (IDDSI P4);Soft and bite Sized (IDDSI SB6);Regular (IDDSI RG7)  Oral Stage: adequate  Pharyngeal Stage:  (no overt s/sx of aspiration noted during assessment)  Esophageal Stage: burping (eructation) (hiccups)    Language Comprehension  Following Commands: Follows multistep commands with increased time;Follows multistep commands with repetition    Language Expression  Primary Mode of Expression Primary Mode of Expression: Verbal  Single Words: WFL  Connected Speech: WFL        Cognitive Linguistic Skills  Arousal/Alertness: Appropriate responses to  stimuli  Attention Span: Difficulty dividing attention  Orientation Level: Oriented X4  Memory: Decreased short term memory  Safety Awareness: independent  Insights: Fully aware of deficits  Problem Solving: Assistance required to identify errors made;Assistance required to implement solutions          Formal Testing  SLUMS  Vanessa Kelly was evaluated with the [employer] Mental Status (SLUMS) Examination which is designed as a rapid screening instrument for mild cognitive impairment and dementia. Vanessa Kelly demonstrated mild-mod deficits in the areas of functional calculations, digit span attention, executive functioning with clock drawing.     Patient's scores are as follows:  Orientation: 3/3   Day of the week: 1/1   Year: 1/1   State: 1/1  Memory: No points given for trials   Functional Calculations: 0/3   Category/Generative Naming: 3/3   15 animals/minute  Delayed Recall: 4/5   w cues: 5/5  Digit Span: 1/2  Visuospatial / Executive Function: 2/6  Clock Drawing: 0/4  Shapes: 2/2  Immediate Recall: 6/8  ----------------------------  Total Score: 19/30    Any score greater than or equal to 27 points (out of 30) is effectively normal (intact). Below this, scores can indicate dementia (1-20 points) or (21-26 points) mild neurocognitive impairment.      Signature  Marjory Sneddon, SLP   12/05/2022  (For scheduling questions, please contact rehab tech x [phone])       [1]   Patient Active Problem List  Diagnosis    Headache    Mastoiditis    Dizziness    Weakness   [2]   Past Medical History:  Diagnosis Date    Diabetes mellitus     Gastroesophageal reflux disease     Hyperlipidemia     Hypertension     Hypotension     Seasonal allergic rhinitis     Syncope

## 2022-12-06 DIAGNOSIS — R42 Dizziness and giddiness: Secondary | ICD-10-CM

## 2022-12-06 LAB — CBC
Absolute nRBC: 0 10*3/uL (ref <=0.00)
Hematocrit: 32.1 % — ABNORMAL LOW (ref 34.7–43.7)
Hemoglobin: 10.8 g/dL — ABNORMAL LOW (ref 11.4–14.8)
MCH: 33.4 pg (ref 25.1–33.5)
MCHC: 33.6 g/dL (ref 31.5–35.8)
MCV: 99.4 fL — ABNORMAL HIGH (ref 78.0–96.0)
MPV: 10.4 fL (ref 8.9–12.5)
Platelet Count: 183 10*3/uL (ref 142–346)
RBC: 3.23 10*6/uL — ABNORMAL LOW (ref 3.90–5.10)
RDW: 12 % (ref 11–15)
WBC: 5.11 10*3/uL (ref 3.10–9.50)
nRBC %: 0 /100 WBC (ref <=0.0)

## 2022-12-06 LAB — RENAL FUNCTION PANEL
Albumin: 3.2 g/dL — ABNORMAL LOW (ref 3.5–5.0)
Anion Gap: 8 (ref 5.0–15.0)
BUN: 18 mg/dL (ref 7–21)
CO2: 25 mEq/L (ref 17–29)
Calcium: 10.5 mg/dL — ABNORMAL HIGH (ref 7.9–10.2)
Chloride: 106 mEq/L (ref 99–111)
Creatinine: 1.2 mg/dL — ABNORMAL HIGH (ref 0.4–1.0)
GFR: 44.1 mL/min/{1.73_m2} — ABNORMAL LOW (ref >=60.0)
Glucose: 119 mg/dL — ABNORMAL HIGH (ref 70–100)
Phosphorus: 3.1 mg/dL (ref 2.3–4.7)
Potassium: 4.9 mEq/L (ref 3.5–5.3)
Sodium: 139 mEq/L (ref 135–145)

## 2022-12-06 LAB — CORTISOL: Cortisol: 6.9 ug/dL

## 2022-12-06 MED ORDER — MECLIZINE HCL 12.5 MG PO TABS
25.0000 mg | ORAL_TABLET | Freq: Three times a day (TID) | ORAL | 0 refills | Status: AC | PRN
Start: 2022-12-06 — End: ?

## 2022-12-06 MED ORDER — NALOXONE HCL 0.4 MG/ML IJ SOLN (WRAP)
0.2000 mg | INTRAMUSCULAR | Status: DC | PRN
Start: 2022-12-06 — End: 2022-12-06

## 2022-12-06 NOTE — Discharge Instr - AVS First Page (Addendum)
Please follow up with PCP after discharge  Please take midodrine 10 tid prn if BP <120, take BP meds if BP >160

## 2022-12-06 NOTE — Progress Notes (Signed)
MEDICINE DISCHARGE SUMMARY    Date Time: 12/06/22 11:16 AM  Patient Name: Vanessa Kelly  Attending Physician: Lucious Groves, DO  Primary Care Physician: Kerin Perna, DO    Date of Admission: 12/04/2022  Date of Discharge: 12/06/2022    Discharge Diagnoses:     Principal Diagnosis (Diagnosis after study, that is chiefly responsible for admission): Orthostatic Hypotension              Disposition:    Disposition: home with family    Pending Results, Recommendations & Instructions to providers after discharge:     Micro / Labs / Path pending:   Unresulted Labs       None              Procedures/Radiology performed:   Radiology: all results from this admission  MR Angiogram Neck W WO Contrast    Result Date: 12/04/2022  1.Stable MRI neck. 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 3.There is no stenosis of the proximal left internal carotid artery based on NASCET criteria. 4.The vertebral arteries are patent. Trilby Drummer, MD 12/04/2022 7:49 PM    MRI Brain W WO Contrast    Result Date: 12/04/2022    1. No acute process. 2. No mass, hydrocephalus, or pathologic fluid collection. 3. No acute infarct. 4. Chronic ischemic changes and age-appropriate volume are present. 5. 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 Trilby Drummer, MD 12/04/2022 7:49 PM    MR Angiogram Head WO Contrast    Result Date: 12/04/2022  1. Stable MRA head. 2. 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 3. Other than that, there is no significant finding. Trilby Drummer, MD 12/04/2022 7:49 PM    CT Cervical Spine without Contrast    Result Date: 12/04/2022  1. No cervical spine fracture is detected. 2. Prominent degenerative changes are seen throughout the cervical spine. 3. In the setting of advanced spondylosis there can be occult fractures, disc injuries, or ligamentous injuries. 4. If there is a neurologic deficit, persistent pain, or a heightened clinical suspicion of occult injury, MRI is  available for further assessment. Theodoro Doing, MD 12/04/2022 3:07 PM    CT Head WO Contrast    Result Date: 12/04/2022   1.No intracranial hemorrhage or acute intracranial findings. 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 Brenton Grills, MD 12/04/2022 1:22 PM    US Carotid Duplex Doppler Complete    Result Date: 11/17/2022  1. Known right internal carotid artery occlusion. 2. Less than 50% stenosis left internal carotid artery.  Darci Current, MD 11/17/2022 4:06 PM   Surgery: all results from this admission  * No surgery found *     Hospital Course:     Vanessa Kelly is a 82 y.o. 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  Patient states she has been having dizziness since Wednesday. States it is been fairly constant since then. She woke up Saturday morning at  430AM and was walking to the bathroom when she fell. She is not sure if she passed out or not, But she felt dizzy and next thing she knew she was sitting on the floor, thinks she  hit her head on a nightstand.Patient spoke with her daughter on the phone and felt her speech was slurred. Patient states that she feels like her speech is abnormal and she has to annunciate more than usual. Per ems, facility staff reported pt has frequent falls related to syncope.  She states she has previously seen neurology and was told that she has some sort of parkinsonian disorder, but she does not agree with this diagnosis.  She is not taking Florinef, however she takes midodrine when her blood pressure is low, however she has labile blood pressure with hypertension while supine and she takes candesartan for that.  She does endorse double vision for the past week, she  went to the ophthalmologist and got new glasses, and was told she has 20/20 vision.  She also has lost peripheral vision in her right eye which is intermittent, she states, " I cannot see people in the approach me on the right side or see my cup of coffee sitting next to me for the past 1-2 weeks"  She does endorse feeling very tired and fatigued after her syncopal episode and sleeping all day, but denies any bowel or bladder incontinence, tongue biting     # syncope/fall/dizziness likely related to ortho stasis   #Speech disturbance. Stroke ruled out   #loss of peripheral vision in right eye  #double vision intermittently   #orthostatic hypotension with supine htn  -continue ASA and statin  -MRI brain did not show any acute process, no mass or hydrocephalus, no acute infarct.  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  -MRA head and neck showing complete occlusion of the right carotid bulb and right internal carotid artery, per patient this is chronic, there is no stenosis of the proximal left internal carotid artery.  Vertebral arteries are patent.  -ECHO fairly unremarkable, preserved ejection fraction 65 to 70%, concentric left ventricular remodeling, aortic sclerosis with trace regurgitation, technically limited bubble study appears minimally positive, however does not appear clinically significant after discussion with neurology.  Otherwise no significant valvular abnormalities or pathology to explain her syncopal episode.  -Orthostatic blood pressures positive, systolic blood pressure dropped from 159 to 96 upon standing after 3 minutes (please see OT note for specific details).   -telemetry reviewed, no events overnight. -continue neuro checks   - neurology following, appreciate recommendations.   - PT, OT , SLP  -A1c 5.7, lipid panel TC 159, TG 69, HDL 75, LDL 70  -check am cortisol tomorrow   -compression stockings, abdominal binder upon  standing, hob 45 degrees while awake. Non pharmacological tx recommended given pt has supine htn.  -follow up with ophthalmology outpatient for the peripheral vision loss and double vision, per neuro no other imaging (ie. Orbit MRI or MRV ) is indicated at this time.   -CT cervical spine with DJD but no acute fracture, CTH neg for acute bleeding/intracranial pathology.   Stable for discharge on 8/6         CAD s/p CABG (2002), stents (2006)  Bilateral carotid artery stenosis  -resume home candesartan.   -monitor BMP  -Continue home ASA and statin  -Followed by outpatient cardiologist [facility])       Chronic macrocytic anemia  -H/H stable on admission, no signs of active bleeding on exam, monitor CBC outpatient     T2DM, diet controlled   -12/2020 A1C 5.4%  -Not on any outpatient medical management currently     AKI  on CKD stage 3A- resolved   -Cr down from  1.4 to 1.1 , back to baseline.   -continue to monitor bmp.  -Avoid NSAIDs/nephrotoxins, renally dose medications     GERD  -Continue home pantoprazole and PRN Tums     Chronic constipation  -Continue home MiraLAX     Seasonal allergic rhinitis  -Consider Zyrtec (substitute for home Allegra), Flonase     Anxiety/depression  -Continue home venlafaxine     Osteoarthritis  S/p right TKA  -Continue home PRN lidocaine patches     Orthostatic hypotension by hx   On midodrine with hold parameters   BP in ED > 200 systolic.   -resume home bp meds.   -abdominal binder while ambulating /out of bed, compression stockings , hob > 45 degrees while awake, ensure adequate hydration.    Discharge Day Exam:  Today:  BP 151/69   Pulse (!) 54   Temp 97.2 F (36.2 C) (Oral)   Resp 15   Ht 1.727 m (5\' 8" )   Wt 87.4 kg (192 lb 10.9 oz)   SpO2 100%   BMI 29.30 kg/m   Ranges for the last 24 hours:  Temp:  [97.2 F (36.2 C)-98.6 F (37 C)] 97.2 F (36.2 C)  Heart Rate:  [54-77] 54  Resp Rate:  [15-18] 15  BP: (96-178)/(59-76) 151/69  Body mass index is 29.3  kg/m.      Intake/Output Summary (Last 24 hours) at 12/06/2022 1116  Last data filed at 12/06/2022 0400  Gross per 24 hour   Intake --   Output 1500 ml   Net -1500 ml    General: awake, alert ,in no acute distress  Cardiovascular: regular rate and rhythm, no murmurs, rubs or gallops  Lungs: clear to auscultation bilaterally, no additional sounds  Abdomen: soft, non-tender, non-distended; normoactive bowel sounds  Extremities: no edema  Neurological: Alert and oriented       Wounds/decutibus ulcers/stage:    Consultations:   Treatment Team:   Attending Provider: Lucious Groves, DO  Consulting Physician: Ardelle Anton, MD  Recent Labs - Last 2:       Recent Labs   Lab 12/06/22  [phone] 12/05/22  0602 12/04/22  1334   WBC 5.11 5.02 5.92   Hemoglobin 10.8* 10.3* 10.3*   Hematocrit 32.1* 30.9* 31.3*   Platelet Count 183 172 177      Recent Labs   Lab 12/05/22  0601 12/04/22  1334   PT 11.5 11.3   INR 1.0 1.0   PTT 28 27      Recent Labs   Lab 12/06/22  0614 12/05/22  0601 12/04/22  1334   Sodium 139 139 136   Potassium 4.9 4.7 5.1   Chloride 106 107 104   CO2 25 28 28    BUN 18 19 24*   Creatinine 1.2* 1.1* 1.4*   GFR 44.1* 49.0* 36.7*   Glucose 119* 108* 163*   Calcium 10.5* 10.6* 11.0*     Recent Labs   Lab 12/06/22  0614 12/05/22  0601 12/04/22  1334   Alkaline Phosphatase  --   --  93   Bilirubin, Total  --   --  0.3   Protein, Total  --   --  7.0   Albumin 3.2* 3.3* 3.6   ALT  --   --  24   AST (SGOT)  --   --  22      Recent Labs   Lab  12/05/22  0601   TSH 1.33     Recent Labs   Lab 12/05/22  0601   Cholesterol 159   Triglycerides 69   HDL 75   LDL Calculated 70        Microbiology Results (last 15 days)       ** No results found for the last 360 hours. **            Discharge Instructions & Follow Up Plan for Patient:   Discharge Diet: Adult diet Therapeutic/ Modified; Solid; Regular (IDDSI level 7); Thin (IDDSI level 0); Consistent carbohydrate and heart healthy  Activity/Weight Bearing Status: as tolerated    Patient was instructed to follow up with:         Complete instructions and follow up are in the patient's After Visit Summary    Minutes spent coordinating discharge and reviewing discharge plan:35 minutes    Discharge Medications:        Medication List        ASK your doctor about these medications      acetaminophen 325 MG tablet  Commonly known as: TYLENOL     albuterol sulfate HFA 108 (90 Base) MCG/ACT inhaler  Commonly known as: PROVENTIL     aspirin EC 81 MG EC tablet     calcium carbonate 500 MG chewable tablet  Commonly known as: TUMS     candesartan 4 MG tablet  Commonly known as: ATACAND     Centrum Silver 50+Women Tabs     ergocalciferol 1.25 MG (50000 UT) capsule  Commonly known as: ERGOCALCIFEROL     fexofenadine 180 MG tablet  Commonly known as: ALLEGRA     Flonase Sensimist 27.5 MCG/SPRAY nasal spray  Generic drug: fluticasone     * lidocaine 5 %  Commonly known as: LIDODERM  Place 1 patch onto the skin every 24 hours Remove & Discard patch within 12 hours or as directed by MD     * lidocaine 5 %  Commonly known as: LIDODERM     lovastatin 40 MG tablet  Commonly known as: MEVACOR     midodrine 5 MG tablet  Commonly known as: PROAMATINE  Take 1 tablet (5 mg) by mouth 3 (three) times daily As needed if SBP < 100 mm of HG     pantoprazole 40 MG tablet  Commonly known as: PROTONIX     polyethylene glycol 17 g packet  Commonly known as: MIRALAX  Take 17 g by mouth daily     ranolazine 500 MG 12 hr tablet  Commonly known as: RANEXA     sucralfate 1 g tablet  Commonly known as: CARAFATE  Take 1 tablet (1 g) by mouth every 6 (six) hours as needed (abdominal pain)     traMADol 50 MG tablet  Commonly known as: ULTRAM     venlafaxine 37.5 MG 24 hr capsule  Commonly known as: EFFEXOR-XR     zolpidem 5 MG tablet  Commonly known as: AMBIEN           * This list has 2 medication(s) that are the same as other medications prescribed for you. Read the directions carefully, and ask your doctor or other care provider  to review them with you.                  Immunizations provided:   Immunization History   Administered Date(s) Administered    COVID-19 mRNA BIVALENT vaccine 12 years and above [employer]) 30 mcg/0.3 mL  02/02/2021       This note was generated by the Epic EMR system/ Dragon speech recognition and may contain inherent errors or omissions not intended by the user. Grammatical errors, random word insertions, deletions and pronoun errors  are occasional consequences of this technology due to software limitations. Not all errors are caught or corrected. If there are questions or concerns about the content of this note or information contained within the body of this dictation they should be addressed directly with the author for clarification.    Signed by: Lucious Groves, DO, MD  [facility]  Department of Medicine  CC: Behiri, Amr H, DO

## 2022-12-06 NOTE — Progress Notes (Signed)
12/06/22 1304   Patient Type   Within 30 Days of Previous Admission? No   Healthcare Decisions   Interviewed: Patient   Orientation/Decision Making Abilities of Patient Alert and Oriented x3, able to make decisions   Advance Directive Patient has advance directive, copy not in chart   Prior to admission   Prior level of function Ambulates with assistive device;Needs assistance with ADLs  (Rollator)   Type of Residence Assisted living  [facility])   Home Layout Able to live on main level with bedroom/bathroom;Ramped entrance   Have running water, electricity, heat, etc? Yes   Living Arrangements Alone;Other (Comment)  Cabin crew)   How do you get to your MD appointments? Daughter/Facility staff   How do you get your groceries? Self/Daughter   Who fixes your meals? Self/Daughter   Who does your laundry? Self/Daughter   Who picks up your prescriptions? Self/Daughter   Dressing Needs assistance   Grooming Needs assistance   Feeding Independent   Bathing Needs assistance   Toileting Needs assistance   DME Currently at [facility], [facility];Cane, Single Point;ADL- Shower Chair   Name of Prior Assisted Living Facility [facility] (Current)   Discharge Planning   Potential barriers to discharge: Other   Mode of transportation: Other  (WC van/Medical transport)   Does the patient have perscription coverage? Yes   Financial Resource Strain   How hard is it for you to pay for the very basics like food, housing, medical care, and heating? Not hard   Housing Stability   In the last 12 months, was there a time when you were not able to pay the mortgage or rent on time? N   In the past 12 months, how many times have you moved where you were living? 0   At any time in the past 12 months, were you homeless or living in a shelter (including now)? N   Transportation Needs   In the past 12 months, has lack of transportation kept you from medical appointments or from getting medications? no   In the past 12  months, has lack of transportation kept you from meetings, work, or from getting things needed for daily living? No   Consults/Providers   PT Evaluation Needed 1   OT Evalulation Needed 1  (Completed)   SLP Evaluation Needed 1  (Completed)   Correct PCP listed in Epic? Yes   Family and PCP   PCP on file was verified as the current PCP? Yes   In case you are admitted, transferred or discharged, would like family notified? Yes   Name of family member to be notified Daughter   In case you are admitted, transferred or discharged, would like your PCP notified? Yes

## 2022-12-06 NOTE — Progress Notes (Signed)
Bedside Nurse made aware of this [place] plan via secure epic chat.      12/06/22 1344   Discharge Disposition   Patient preference/choice provided? N/A   Physical Discharge Disposition Assisted Living Facility   Name of Assisted Living Facility [facility], room [address]   Nursing report phone number: [phone]   Mode of Transportation Ambulance   Pick up time 3 PM.   Patient/Family/POA notified of transfer plan Yes   Patient agreeable to discharge plan/expected d/c date? Yes   Family/POA agreeable to discharge plan/expected d/c date? Yes  (CM informed pt's daughter Adrian Blackwater [phone]). Pt's daughter has nofurther concerns at this time.)   CM Interventions   Follow up appointment scheduled? No   Reason no follow up scheduled? Family to schedule   Notified MD? Yes   Multidisciplinary rounds/family meeting before d/c? Yes   Medicare Checklist   Is this a Medicare patient? Yes   Patient received 1st IMM Letter? n/a   If LOS 3 days or greater, did patient received 2nd IMM Letter? n/a       Berline Chough, BSN, RN  RN Case Manager   [phone]  (F): [phone]

## 2022-12-06 NOTE — PT Eval Note (Signed)
Physical Therapy Evaluation  Vanessa Kelly      Post Acute Care Therapy Recommendations:     Discharge Recommendations:  Home with home health PT, Home with supervision (Return to [facility])      DME needs IF patient is discharging home: Patient already has needed equipment    Therapy discharge recommendations may change with patient status.  Please refer to most recent note for up-to-date recommendations.           [facility]  [address]  [place], [place] [address]  [phone]    Physical Therapy Evaluation    Patient: Vanessa Kelly MRN: [id number]   Unit: [facility] INTERMEDIATE CARE Bed: MI632/MI632-01    Time of Treatment:   PT Received On: 12/06/22  Start Time: 1135  Stop Time: 1208  Time Calculation (min): 33 min       Consult received for Vanessa Kelly for PT evaluation and treatment.  Patient's medical condition is appropriate for Physical Therapy  intervention at this time.    Interpreter utilized: no, not indicated      Assessment   Vanessa Kelly is a 82 y.o. female admitted 12/04/2022 w/ dizziness and frequent falls related to syncope. CT and MRI neg for acute abnormality. Pt has a PMH of HTN, CAD, HLD, diabetes, CKD, and orthostatic hypotension.   Pt in bed upon arrival and agreeable to PT eval. At baseline, pt resides in ALF and is ambulatory w/ a rollator, modI for ADLs. Pt reports that she uses TED hose at baseline. Today, pt required SBA for bed mobility and transfers. Pt able to don TED hose mostly with superv, required modA to don abdominal binder. Pt orthostatic during session (see vitals section), but pt reported no symptoms throughout mobility. Pt able to ambulate 65' w/ RW and SBA. Pt able to return to supine w/ SBA. Pt is currently functioning below her baseline and would benefit from skilled PT during her admission. D/c recommendation return to ALF w/ HHPT.        Complexity Level Hx and Co  morbidites Examination Clinical Decision Making Clinical Presentation    Low  no impact 1-2 elements Limited options Stable       PMP - Progressive Mobility Protocol   PMP Activity: Step 7 - Walks out of Room  Distance Walked (ft) (Step 6,7): 75 Feet       Rehabilitation Potential: Fair       Interdisciplinary Communication: RN, OT      Plan     Plan  Risks/Benefits/POC Discussed with Pt/Family: With patient  Treatment/Interventions: Exercise;Gait training;Functional transfer training;LE strengthening/ROM;Endurance training  PT Frequency: 2-3x/wk         Medical Diagnosis: Dizziness [R42]  Weakness [R53.1]    History of Present Illness: Vanessa Kelly is a 82 y.o. 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  Patient states she has been having dizziness since Wednesday. States it is been fairly constant since then. She woke up today at 430AM and was walking when she fell. She is not sure if she passed out or not. Patient spoke with her daughter on the phone and felt her speech was  slurred. Patient states that she feels like her speech is abnormal and she has to annunciate more than usual. Patient denies changes in vision. Per ems, facility staff reported pt has frequent falls related to syncope. LKW 0430. She c/o right frontal head pain and low back pain. "      Problem List[1]  Medical History[2]    Past Surgical History:   Procedure Laterality Date    BREAST BIOPSY Right     2014    CORONARY ARTERY BYPASS      HYSTERECTOMY      partial-1970's       Tests/Labs:  Lab Results   Component Value Date/Time    HGB 10.8 (L) 12/06/2022 06:14 AM    HGB 10.4 (L) 06/28/2022 06:18 AM    HCT 32.1 (L) 12/06/2022 06:14 AM    HCT 31.8 (L) 06/28/2022 06:18 AM    K 4.9  12/06/2022 06:14 AM    K 4.5 06/28/2022 06:18 AM    NA 139 12/06/2022 06:14 AM    NA 140 06/28/2022 06:18 AM    INR 1.0 12/05/2022 06:01 AM    TROPI 13.0 12/05/2022 09:46 AM    TROPI 40.3 (H) 12/04/2022 01:34 PM    TROPI 6.7 06/23/2022 06:41 PM    TROPI <2.7 01/18/2022 05:15 PM    TROPI <0.01 01/21/2021 02:30 PM         Imaging   MR Angiogram Neck W WO Contrast    Result Date: 12/04/2022  1.Stable MRI neck. 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 3.There is no stenosis of the proximal left internal carotid artery based on NASCET criteria. 4.The vertebral arteries are patent. Trilby Drummer, MD 12/04/2022 7:49 PM    MRI Brain W WO Contrast    Result Date: 12/04/2022    1. No acute process. 2. No mass, hydrocephalus, or pathologic fluid collection. 3. No acute infarct. 4. Chronic ischemic changes and age-appropriate volume are present. 5. 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 Trilby Drummer, MD 12/04/2022 7:49 PM    MR Angiogram Head WO Contrast    Result Date: 12/04/2022  1. Stable MRA head. 2. 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 3. Other than that, there is no significant finding. Trilby Drummer, MD 12/04/2022 7:49 PM    CT Cervical Spine without Contrast    Result Date: 12/04/2022  1. No cervical spine fracture is detected. 2. Prominent degenerative changes are seen throughout the cervical spine. 3. In the setting of advanced spondylosis there can be occult fractures, disc injuries, or ligamentous injuries. 4. If there is a neurologic deficit, persistent pain, or a heightened clinical suspicion of occult injury, MRI is available for further assessment. Theodoro Doing, MD 12/04/2022 3:07 PM    CT Head WO Contrast    Result Date: 12/04/2022   1.No intracranial hemorrhage or acute intracranial findings. 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 Brenton Grills, MD 12/04/2022 1:22 PM     Social History: (Per family/care provider, if patient is not  able to give accurate report)  Lives alone in a ALF.  Equipment at home:  rollator, shower chair  Prior Level of Function:                Cognition: intact                        Mobility/Locomotion: independent w/ rollator  Feeding: independent              Grooming: independent              Bathing: independent              Dressing: independent              Toileting: independent  Pt reports history of dealing with orthostatic hypotension. She uses rollator so that if she feel it coming on, she can immediately sit. She also has a shower chair for this reason.     Subjective   "I use the TED hose for swelling"  Patient is agreeable to participation in the therapy session. Nursing clears patient for therapy.  Patient's Goal:  none stated  Pain: pt denies pain      Objective     Precautions/ Contraindications:   Precautions  Weight Bearing Status: no restrictions  Other Precautions: falls, orthostatic    Patient is in bed with telemetry, PrimaFit (external female catheter), and intravenous access in place.       Observation of patient/vitals:        Blood Pressure MAP Heart Rate   Supine 135/76 82 70   Sitting (without TED hose or abd bind) 89/53 65 76   Sitting w/  TED hose 129/55 80 72   Standing w/ TED hose 89/56 67 76   Standing w/ TED hose and abd bind 123/84 97 80   Standing after ambulation w/ TED hose and abdominal binder 102/64 77 74         Orientation/Cognition:  Alert and Oriented x 4  Cognition: WFL      Musculoskeletal Examination:      ROM Strength   RLE WFL 4/5   LLE WFL 4/5     Sensation: intact  Coordination: fair    Functional Mobility:    Supine to sit: SBA  Scooting: SBA  Sit to Supine: SBA  Sit to stand: SBA  Stand to sit: SBA    Ambulation:     Weightbearing: no restrictions   Assistance level: SBA   Distance: 75'   Assistive Device: RW   Gait Deviations: decreased gait speed       Balance:  Static Sit: good  Dynamic Sit: good  Static Stand: good  Dynamic Stand: fair    Endurance:  good    Participation:  good    Education:  Educated the patient to role of physical therapy, plan of care, goals  of therapy, rationale for progressing mobility and safety with mobility and ADLs, energy conservation techniques, pursed lip breathing, skin protection, and home safety.    RN notified of session outcome and that patient was left in bed with all needs met and equipment intact.   Safety measures include: handoff to nurse/clin tech/ unit secretary completed, bed alarm activated, oriented to call bell and placed within reach, personal items within reach, assistive device positioned out of reach, and bed placed in lowest position.   Mobility and ADL status posted at bedside and within E.M.R.    AM-PACT Inpatient Short Forms  Inpatient AM-PACT Performed? (PT): Basic Mobility Inpatient Short Form   AM-PACT "6 Clicks" Basic Mobility Inpatient Short Form  Turning Over in Bed: A little  Sitting Down On/Standing From Armchair: A little  Lying on Back to Sitting on Side of Bed: A little  Assist Moving to/from Bed to Chair: A little  Assist to Walk in Hospital Room: A little  Assist to  Climb 3-5 Steps with Railing: A little  PT Basic Mobility Raw Score: 18  CMS 0-100% Score: 46.58%      Goals  Goal Formulation: With patient  Time for Goal Acheivement: 5 visits  Pt Will Go Supine To Sit: independent  Pt Will Perform Sit To Supine: independent  Pt Will Perform Sit to Stand: modified independent  Pt Will Ambulate: 51-100 feet;with rolling walker;modified independent       Modified Rankin Scale:      I am certified in mRS: Yes  mRS Assessment Source: Patient  mRS Value: 1 - No significant disability    Therapist PPE during session gloves     Signature:  Lennox Pippins, PT  12/06/2022  3:09 PM       (For scheduling questions, please contact rehab tech x [phone])             [1]   Patient Active Problem List  Diagnosis    Headache    Mastoiditis    Dizziness    Weakness   [2]   Past Medical History:  Diagnosis Date     Diabetes mellitus     Gastroesophageal reflux disease     Hyperlipidemia     Hypertension     Hypotension     Seasonal allergic rhinitis     Syncope

## 2022-12-06 NOTE — Progress Notes (Signed)
Date Time: 12/06/22 9:59 AM  Patient Name: Vanessa Kelly  Attending Physician: Lucious Groves, DO  Patient Class: Observation  Hospital Day: 0            NEUROLOGY PROGRESS NOTE             Assessment/Plan   Recurrent dizziness - possibly from orthostasis. Given slurred speech, underwent MRI brain - this shows nothing acute.    -orthostatic vitals signs were positive   -recommend compression stockings and abdominal binder   -Was on mestinon but stopped due to nightmares, unclear whether she is taking florinef or not but on 2/28 note states that she did not want to take medication as she thought it contributed to her headaches   -recommend discontinue candesartan/hypertensive blood pressure medication   -If patient can ambulate can be cleared to [place] to facility, however if not should not be [place]ed        Recent Labs   Lab 12/05/22  0601   Hemoglobin A1C 5.7*     Recent Labs   Lab 12/05/22  0601   Cholesterol 159   Triglycerides 69   HDL 75   LDL Calculated 70     Recent Labs   Lab 12/05/22  0601   LDL Calculated 70          Subjective:   Patient Seen and Examined. No complaints at this time.    Review of Systems:   No headache, eye, ear nose, throat problems; no coughing or wheezing or shortness of breath,   No chest pain or orthopnea, no abdominal pain, nausea or vomiting,   No pain in the body or extremities, no psychiatric, neurological, endocrine, hematological   or cardiac complaints except as noted above.        Physical Exam:   BP 151/69   Pulse (!) 54   Temp 97.2 F (36.2 C) (Oral)   Resp 15   Ht 5\' 8"  (1.727 m)   Wt 87.4 kg   SpO2 100%   BMI 29.30 kg/m     Neuro:  Level of consciousness:  Alert and appropriate  Oriented:  X 3  Facial Movements: symmetric  pupils are equal and reactive   Strength:  No upper extremity drift  Sensation to light touch: Intact bilaterally      Meds:     Home Medications       Med List Status: Complete Set By: Gara Kroner, RN at 12/04/2022  8:27 PM               acetaminophen (TYLENOL) 325 MG tablet     Take 2 tablets (650 mg) by mouth every 6 (six) hours as needed for Pain (or headache)     albuterol sulfate HFA (PROVENTIL) 108 (90 Base) MCG/ACT inhaler          aspirin EC 81 MG EC tablet     Take 1 tablet (81 mg) by mouth daily     calcium carbonate (TUMS) 500 MG chewable tablet     Chew 1 tablet (500 mg) by mouth every 6 (six) hours as needed for Heartburn     candesartan (ATACAND) 4 MG tablet     Take 1 tablet (4 mg) by mouth daily as needed (SBP > 160)     ergocalciferol (ERGOCALCIFEROL) 1.25 MG (50000 UT) capsule     Take 1 capsule (50,000 Units) by mouth once a week On Saturday     fexofenadine (ALLEGRA) 180 MG tablet  Take 1 tablet (180 mg) by mouth daily     fluticasone (Flonase Sensimist) 27.5 MCG/SPRAY nasal spray     1 spray by Nasal route daily     lidocaine (LIDODERM) 5 %     Place 1 patch onto the skin every 24 hours Remove & Discard patch within 12 hours or as directed by MD     Notes:  Apply to lower back     lidocaine (LIDODERM) 5 %     Place 1 patch onto the skin every 24 hours Remove & Discard patch within 12 hours or as directed by MD     lovastatin (MEVACOR) 40 MG tablet     Take 1 tablet (40 mg) by mouth nightly     midodrine (PROAMATINE) 5 MG tablet     Take 1 tablet (5 mg) by mouth 3 (three) times daily As needed if SBP < 100 mm of HG     Multiple Vitamins-Minerals (Centrum Silver 50+Women) Tab     Take 1 tablet by mouth daily     pantoprazole (PROTONIX) 40 MG tablet     Take 1 tablet (40 mg) by mouth nightly     polyethylene glycol (MIRALAX) 17 g packet     Take 17 g by mouth daily     Patient taking differently: Take 17 g by mouth 2 (two) times daily     ranolazine (RANEXA) 500 MG 12 hr tablet     Take 1 tablet (500 mg) by mouth 2 (two) times daily     sucralfate (CARAFATE) 1 g tablet     Take 1 tablet (1 g) by mouth every 6 (six) hours as needed (abdominal pain)     traMADol (ULTRAM) 50 MG tablet     Take 1 tablet (50 mg) by mouth every 8  (eight) hours as needed for Pain     venlafaxine (EFFEXOR-XR) 37.5 MG 24 hr capsule     Take 2 capsules (75 mg) by mouth nightly     zolpidem (AMBIEN) 5 MG tablet     Take 1 tablet (5 mg) by mouth nightly             Scheduled Meds: PRN Meds:    aspirin EC, 81 mg, Oral, Daily  atorvastatin, 10 mg, Oral, Daily  cetirizine, 10 mg, Oral, Daily  enoxaparin, 40 mg, Subcutaneous, Daily  fluticasone, 2 spray, Each Nare, Daily  lidocaine, 1 patch, Transdermal, Q24H  pantoprazole, 40 mg, Oral, QAM AC  polyethylene glycol, 17 g, Oral, Daily  ranolazine, 500 mg, Oral, BID  venlafaxine, 75 mg, Oral, QHS  vitamin D (ergocalciferol), 50,000 Units, Oral, Weekly  vitamins/minerals, 1 tablet, Oral, Daily        Continuous Infusions:   acetaminophen, 650 mg, Q6H PRN  calcium carbonate, 1 tablet, Q6H PRN  hydrALAZINE, 10 mg, Q3H PRN  labetalol, 10 mg, Q15 Min PRN  ondansetron, 4 mg, Q6H PRN   Or  ondansetron, 4 mg, Q6H PRN  sucralfate, 1 g, Q6H PRN  traMADol, 50 mg, Q8H PRN  valsartan, 40 mg, Daily PRN                Labs:     Recent Labs   Lab 12/06/22  0614 12/05/22  0601 12/04/22  1334   Glucose 119* 108* 163*   BUN 18 19 24*   Creatinine 1.2* 1.1* 1.4*   Calcium 10.5* 10.6* 11.0*   Sodium 139 139 136   Potassium 4.9 4.7 5.1  Chloride 106 107 104   CO2 25 28 28    Albumin 3.2* 3.3* 3.6   Phosphorus 3.1 2.7  --    AST (SGOT)  --   --  22   ALT  --   --  24   Bilirubin, Total  --   --  0.3   Alkaline Phosphatase  --   --  93     Recent Labs   Lab 12/06/22  0614 12/05/22  0602 12/04/22  1334   WBC 5.11 5.02 5.92   Hemoglobin 10.8* 10.3* 10.3*   Hematocrit 32.1* 30.9* 31.3*   MCV 99.4* 99.0* 100.0*   MCH 33.4 33.0 32.9   MCHC 33.6 33.3 32.9   Platelet Count 183 172 177                 Lab Results   Component Value Date    TSH 1.33 12/05/2022               .ll    Recent Labs     12/05/22  0601 12/04/22  1334   PTT 28 27   PT 11.5 11.3   INR 1.0 1.0          Radiology Results (24 Hour)       ** No results found for the last 24 hours. **              All brain imaging (MRI, CT) personally reviewed.    Case discussed with: Dr. Moise Boring        I personally reviewed all of the medications.  Medication list generated using all available resources.  Elder abuse (physical)  - negative  Advanced care plan - reviewed from chart or in discussion with pt or family        This note was generated by the Epic EMR system/ Dragon speech recognition and may contain inherent errors or omissions not intended by the user. Grammatical errors, random word insertions, deletions and pronoun errors  are occasional consequences of this technology due to software limitations.   Not all errors are caught or corrected. If there are questions or concerns about the content of this note or information contained within the body of this dictation they should be addressed directly with the author for clarification.      Signed by: Janeice Robinson, FNP  Spectralink: [id number]      Answering Service: [phone]

## 2022-12-06 NOTE — Nursing Progress Note (Signed)
Pt AOX4 and VSS. Pt did not complain of pain or sob and did not appear to be in respiratory distress. All meds given to pt per [facility]. Pt on stroke pathway with NIH and neuro checks with vs taken Q4. All safety measures in place, call bell within reach, bed in lowest position, and bed alarm on. Plan of care ongoing.      The learning abilities of the patient and/or caregiver have been assessed. Today's individualized plan of care includes NIH, neuro checks and VS q4, safety. The patient or caregiver states the following personal goal related to the patient's deficit(s): "By discharge I want to be able to be able to not feel like I'm talking weird." The plan of care was discussed with the patient and/or caregiver, who agrees to it and demonstrates understanding of the disease process, risk factors, treatment plan, medications and consequences of noncompliance. All questions and concerns were addressed.

## 2022-12-06 NOTE — Progress Notes (Signed)
[facility] will pick up patient @ 1500

## 2022-12-06 NOTE — Progress Notes (Signed)
12/06/22 1310   CM Review   Case Management Assessment Status Remote assessment complete   CM Comments 8/6 - From: ALF; DCP: Return to ALF w/ HH PT, OT; transport assistance

## 2022-12-06 NOTE — Plan of Care (Signed)
Problem: Pain interferes with ability to perform ADL  Goal: Pain at adequate level as identified by patient  Outcome: Adequate for Discharge  Flowsheets (Taken 12/06/2022 1238)  Pain at adequate level as identified by patient:   Reassess pain within 30-60 minutes of any procedure/intervention, per Pain Assessment, Intervention, Reassessment (AIR) Cycle   Assess pain on admission, during daily assessment and/or before any "as needed" intervention(s)     Problem: Side Effects from Pain Analgesia  Goal: Patient will experience minimal side effects of analgesic therapy  Outcome: Adequate for Discharge  Flowsheets (Taken 12/06/2022 1238)  Patient will experience minimal side effects of analgesic therapy:   Assess for changes in cognitive function   Monitor/assess patient's respiratory status (RR depth, effort, breath sounds)     Problem: Compromised Activity/Mobility  Goal: Activity/Mobility Interventions  Outcome: Adequate for Discharge  Flowsheets (Taken 12/06/2022 1238)  Activity/Mobility Interventions: Pad bony prominences, TAP Seated positioning system when OOB, Promote PMP, Reposition q 2 hrs / turn clock, Offload heels     Problem: Day of Admission - Stroke  Goal: Core/Quality measure requirements - Admission  Outcome: Adequate for Discharge  Flowsheets (Taken 12/06/2022 1238)  Core/Quality measure requirements - Admission:   Ensure antithrombotic administered or contraindication documented by LIP   If diagnosis or history of Atrial Fib/Atrial Flutter, ensure oral anticoagulation is initiated or contraindication documented by LIP     Problem: Day of Discharge - Stroke  Goal: Able to express understanding of discharge instructions and stroke education  Outcome: Adequate for Discharge  Flowsheets (Taken 12/06/2022 1238)  Able to express understanding of discharge instructions and stroke education:   Include patient care companion in decisions related to communication   Patient/patient care companion demonstrates  understanding on disease process, treatment plan, medications and discharge plan  Goal: Core/Quality measures - Discharge  Outcome: Adequate for Discharge  Flowsheets (Taken 12/06/2022 1238)  Core/Quality measures - Discharge:   Document discharge NIH Stroke Scale   Complete final verification medications: check and compare prior to admit (PTA), during hospitalization and discharge medication list

## 2022-12-06 NOTE — Nursing Progress Note (Addendum)
VSS. A&O x4.   Patient denies any pain.  pt was [place] home with transport back to ALF. Report given to ALF. IV lines removed.   Tele removed and returned.   Belongings accounted for and signed off by pt.  Discharge education given. Acknowledged understanding of discharge instructions and all questions were answered.  Given Stroke magnet. Patient also took compression stockings and abdominal binder.   Pt was wheeled out of the unit at 1556 by transport.

## 2022-12-06 NOTE — SLP Progress Note (Signed)
Speech Language Pathology  Speech and Language Treatment    Patient: Vanessa Kelly    MRN#: [id number]    Date/Time of Treatment:   SLP Received On: 12/06/22  Start Time: 1045  Stop Time: 1120  Time Calculation (min): 35 min      Interpreter utilized: no, not indicated    Therapist PPE during session procedural mask and gloves     Assessment   Vanessa Kelly is an 82 y.o. 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     Follow up for swallow and communication domains initiated today with good tolerance of 3oz consecutive swallow screen and WNL confrontation naming with formal testing.  Rare episode of word finding in conversation, suspect largely age-related vs. chronic deficits from prior CVA. Per MD, pt may discharge today; rec f/u with OP SLP to further assess higher level cognitive-communication skills and word finding strategies.      Plan   SLP toward POC  Diet Solids Recommendation: Continue with current diet, Regular (IDDSI RG7)  Diet Liquids Recommendations: Thin Liquids (IDDSI TN0)  Precautions/Compensations: Awake/alert, Upright 90 degrees for all oral intake, Alternate solids and liquids, Small bites/sips, Eat/feed slowly  Recommended Form of Meds: PO, with liquid (as tolerated)  Suggestions for Feeding: independent  Recommendations: Outpatient speech  Recommendation Discussed With: : Patient, Physician, Nurse, Posted bedside, Family/Caregiver  Referral(s):: Neurologist     Goals:   Patient will participate in ongoing dynamic assessment of cog-communication, speech/language function (progressing 8/6)  Pt will participate in VFSS to  define swallow physiology & therapy program (progressing 8/6; not indicated at this time)    Interdisciplinary Communication: patient, RN, MD    SLP Visit Number: 1    Subjective   Patient is agreeable to participation in the therapy session. Nursing clears patient for therapy. Patient's medical condition is appropriate for Speech therapy intervention at this time.    Pain: pt denies pain    Precautions: aspiration, fall, seizure, skin, bleeding, hypotensive  Objective   Handoff appreciated from RN, who cleared pt for therapy.  Advised of recent Protonix admin with good tolerance of reg/thin diet.  Pt received seated in bed completing pericare with clintech, agreeable to participation in SLP session.      Swallow - reduced facial droop noted today compared to doc from yesterday. Pt consumed 3 ozs of thin water via consecutive straw sip. No overt s/sx noted, no belching, no globus. Pt reports good tolerance of current diet. Discussed plan to hold on plan for VFSS, not indicated at this time, SLP to monitor.    Communication - pt reports possible need for hearing aides (OP f/u ongoing). Endorses reduced word finding skills, somewhat concerning. PNT completed with 100% accuracy (30/30). X1 episode of word finding (for "hearing aid") however pt independently initiated circumlocution with good success. No overt breakdowns in communication.    MD rounded with RN during session. Discussed possible supports and problem solving for ongoing hypotension (BSC, defer to OT), possible candidacy for change in parameters for midodrine, TEDs.  Per MD, pt may discharge today.    Signature:   Derinda Sis, SLP  12/06/2022 11:37  AM    (For scheduling questions, please contact rehab tech x [phone])

## 2022-12-09 NOTE — Progress Notes (Signed)
AMBULATORY CARE MANAGEMENT Outreach Call    Hospital patient admitted to: [facility] Admission/Discharge Dates:   12/04/22 to 12/06/22     Reason for admission to hospital: Orthostatic Hypotension     Name: Vanessa Kelly    ### Patient Details  Date of Birth: 08/19/1940  MRN: [id number]    ### Encounter Details  Arrival Date: 12/04/2022 01:13 PM EDT  Discharge Date: 12/06/2022 03:50 PM EDT  Encounter ID: [id number] TCM8/10/2022   3:50:00PM    ### Related interaction  [facility] - TCM V2 Post Discharge Outreach (Post Discharge TCM V2 Outreach 1) ([email or web address]    ### Questions     Question 1   Supplies   Do you have all the medical supplies needed for a successful recovery? Press 1 for Yes, Press 2 for No   Doesn't have supplies (Issue Panel: Post Discharge - TCM)    ### Required Interventions and Feedback     Call Status         Comments::     Call to [phone] to check on status-left VM message with my contact information and request for call back-per medical record, patient resides in [facility] (edited by MF on 12/09/2022 03:15 PM EDT)     Obtaining Prescriptions         Obtaining Prescriptions - Issues:     Other (Add Details in Comments) (edited by MF on 12/09/2022 03:12 PM EDT)    Comments:     Patient resides in Assisted Living Facility (edited by MF on 12/09/2022 03:16 PM EDT)     Obtaining Rx - Action List         No Interventions Needed:     Yes (edited by MF on 12/09/2022 03:16 PM EDT)     Follow Up Scheduled         Follow-Up Appointment Help  - Issues:     Other (Add Details in Comments) (edited by MF on 12/09/2022 03:12 PM EDT)    Comments:     Patient resides in Assisted Living Facility (edited by MF on 12/09/2022 03:16 PM EDT)     Follow-Up Appointment  Help - Actions Taken         No Interventions Needed:     Yes (edited by MF on 12/09/2022 03:16 PM  EDT)     Supplies/DME         Supplies- Issues:     Other (Add Details in Comments) (edited by MF on 12/09/2022 03:16 PM EDT)    Comments:     Patient resides in Assisted Living Facility (edited by MF on 12/09/2022 03:16 PM EDT)     Supplies/DME - Actions Taken         No Interventions Needed:     Yes (edited by MF on 12/09/2022 03:16 PM EDT)     General Status          Comments:     Patient resides in Assisted Living Facility (edited by MF on 12/09/2022 03:16 PM EDT)     General Status - Actions Taken         No Interventions Needed:     Yes (edited by MF on 12/09/2022 03:16 PM EDT)     Home Health         Home Health - Issues:     No HH Order. (edited by MF on 12/09/2022 03:12 PM EDT)    Comments:     NA (edited  by MF on 12/09/2022 03:12 PM EDT)     Home Health - Actions Taken         No Interventions Needed:     Yes (edited by MF on 12/09/2022 03:12 PM EDT)     Discharge Instruction - Issues         List of Issues:     Other (add details in comments) (edited by MF on 12/09/2022 03:11 PM EDT)    Comments:     Patient resides in Assisted Living Facility (edited by MF on 12/09/2022 03:16 PM EDT)     Discharge Instruction - Actions Taken         No Interventions Needed:     Yes (edited by MF on 12/09/2022 03:16 PM EDT)     Medication Questions - Issues         List of Issues:     Other (Add Details in Comments) (edited by MF on 12/09/2022 03:12 PM EDT)    Comments::     Patient resides in Assisted Living Facility (edited by MF on 12/09/2022 03:16 PM EDT)     Medication Questions - Actions List         No Interventions Needed:     Yes (edited by MF on 12/09/2022 03:16 PM EDT)    Collene Mares BSN, RN, CCM  RN Case Manager II  Ambulatory Care Management  [address], [place] [address]  Ph: [phone]

## 2023-01-03 ENCOUNTER — Encounter: Payer: Self-pay | Admitting: Neurology

## 2023-02-27 ENCOUNTER — Telehealth (HOSPITAL_BASED_OUTPATIENT_CLINIC_OR_DEPARTMENT_OTHER): Payer: Self-pay

## 2023-02-27 NOTE — Telephone Encounter (Signed)
Pt called in to report that about 20 years ago she had a triple bypass; her  symptoms at that time was heartburn, headache , and fatigue which she reports feeling now  Pt reports that she naps approx 5 hours per daily    Per pt her bp is high when she goes to therapy and low when she finishes; pt unable to give any exact numbers    Denies chest pain but does report SOB with activity  BP at time of call - pt unable to check    Per pt she is only able to have an appt on Tuesday or Thursday  Next appt - 03/16/2023    Pt advised to seek emergency care if symptoms change or if she feels the situation is life threatening; pt also to ask staff at her facility if they would be able to transport her to an appt on another day of the week and contact office if that is a possibility    Please advise  Best number to reach pt - [phone]

## 2023-02-27 NOTE — Telephone Encounter (Signed)
Patient currently complaining of sob and slight headache. Patient unable to take blood pressure at this time but stated this AM it was in the 140s. Can sometimes dip into the 90s but she "takes medicine for that". Asked patient to try and monitor blood pressure and HR so that we can begin to trend these two items and possible adjust medication prior to next office visit in November. Patient aware to seek medical attention if anything worsens.     [place] RN  [facility]

## 2023-03-16 ENCOUNTER — Encounter (INDEPENDENT_AMBULATORY_CARE_PROVIDER_SITE_OTHER): Payer: Self-pay | Admitting: Internal Medicine

## 2023-03-16 ENCOUNTER — Ambulatory Visit (INDEPENDENT_AMBULATORY_CARE_PROVIDER_SITE_OTHER): Payer: Medicare Other | Admitting: Internal Medicine

## 2023-03-16 VITALS — BP 157/78 | HR 73 | Ht 69.0 in | Wt 187.0 lb

## 2023-03-16 DIAGNOSIS — I2510 Atherosclerotic heart disease of native coronary artery without angina pectoris: Secondary | ICD-10-CM

## 2023-03-16 DIAGNOSIS — I2583 Coronary atherosclerosis due to lipid rich plaque: Secondary | ICD-10-CM

## 2023-03-16 DIAGNOSIS — R0602 Shortness of breath: Secondary | ICD-10-CM

## 2023-03-16 DIAGNOSIS — I6523 Occlusion and stenosis of bilateral carotid arteries: Secondary | ICD-10-CM

## 2023-03-16 DIAGNOSIS — I6521 Occlusion and stenosis of right carotid artery: Secondary | ICD-10-CM

## 2023-03-16 LAB — ECG 12-LEAD
Atrial Rate: 67 {beats}/min
P Axis: 63 degrees
P-R Interval: 292 ms
Q-T Interval: 378 ms
QRS Duration: 98 ms
QTC Calculation (Bezet): 399 ms
R Axis: -29 degrees
T Axis: 47 degrees
Ventricular Rate: 67 {beats}/min

## 2023-03-16 NOTE — Progress Notes (Signed)
 [facility] [place] OFFICE CONSULTATION    I had the pleasure of seeing [name] today for cardiovascular evaluation. She is a pleasant 82 y.o. female with a history of CABG 3V 2002 [facility], st[facility] 2006 - [facility],  HTN, DM, CVA,

## 2023-03-20 ENCOUNTER — Other Ambulatory Visit (INDEPENDENT_AMBULATORY_CARE_PROVIDER_SITE_OTHER): Payer: Self-pay | Admitting: Surgery

## 2023-03-20 DIAGNOSIS — R600 Localized edema: Secondary | ICD-10-CM

## 2023-03-20 DIAGNOSIS — I872 Venous insufficiency (chronic) (peripheral): Secondary | ICD-10-CM

## 2023-03-20 NOTE — Progress Notes (Signed)
Patient called to schedule a follow up appointment for reassessment of carotid arteries. Patient is also complaining of left leg edema which was previously addressed by Dr. Doreatha Martin. Patient would like to get imaging done again. [place] order has been placed.

## 2023-03-20 NOTE — Addendum Note (Signed)
 Addended by: Leotis Pain on: 03/20/2023 12:21 PM     Modules accepted: Orders

## 2023-03-22 ENCOUNTER — Encounter (INDEPENDENT_AMBULATORY_CARE_PROVIDER_SITE_OTHER): Payer: Self-pay

## 2023-03-22 NOTE — Progress Notes (Signed)
 Appt Date/Time: 04/06/23@1500     82 y.o. female   PCP: Behiri, Amr H, DO            Referring Physician:      Preferred Language: English      Appointment Type: Follow-Up  Imaging: [place] Today---->Venous Insufficiency@1400     Chief Complaint/HPI: F/u for BLE

## 2023-04-06 ENCOUNTER — Ambulatory Visit (INDEPENDENT_AMBULATORY_CARE_PROVIDER_SITE_OTHER): Payer: Medicare Other | Admitting: Acute Care

## 2023-04-06 ENCOUNTER — Other Ambulatory Visit (INDEPENDENT_AMBULATORY_CARE_PROVIDER_SITE_OTHER): Payer: Medicare Other

## 2023-04-11 ENCOUNTER — Ambulatory Visit: Payer: Medicare Other | Admitting: Neurology

## 2023-04-19 NOTE — Progress Notes (Deleted)
CONSULTATION  Vascular Surgery    Date Time: April 19, 2023 3:58 PM  Referring Physician: Celene Skeen  Consulting Service: Vascular Surgery    Reason For Consultation: Review carotid ultrasound and examined left lower extremity  History of Present Illness     Vanessa Kelly is an 82 y.o. female with a history of CABG and L GSV harvesting with residual left leg swelling, and asymptomatic carotid stenosis. She was last seen 12/2020 at which time she was advised to follow-up in 1 year with repeat carotid duplex.     She presented to the hospital 06/2022 with a headache at which time MRA revealed chronic R ICA occlusion, no acute infarct, and remote small infarct/insult in the right parietal lobe     She presents today in follow-up with venous insufficiency imaging.     There are no reports of rest pain, claudication or TIA type symptoms.  Risk factors for vascular disease includes diabetes hypertension and hyperlipidemia. ***    Past History     Past Medical History:   Diagnosis Date    Diabetes mellitus     Gastroesophageal reflux disease     Hyperlipidemia     Hypertension     Hypotension     Seasonal allergic rhinitis     Syncope       Past Surgical History:   Procedure Laterality Date    BREAST BIOPSY Right     2014    CORONARY ARTERY BYPASS      HYSTERECTOMY      partial-1970's     Family History   Problem Relation Name Age of Onset    Heart failure Mother      Myocardial Infarction Father      Breast cancer Sister       Social History     Socioeconomic History    Marital status: Married   Tobacco Use    Smoking status: Former     Current packs/day: 0.00     Types: Cigarettes     Quit date: 1990     Years since quitting: 34.9    Smokeless tobacco: Never   Vaping Use    Vaping status: Never Used   Substance and Sexual Activity    Alcohol use: Yes     Comment: occasionally    Drug use: Never     Social Drivers of Psychologist, prison and probation services Strain: Low Risk  (12/06/2022)    Overall Financial Resource Strain  (CARDIA)     Difficulty of Paying Living Expenses: Not hard at all   Food Insecurity: No Food Insecurity (12/04/2022)    Hunger Vital Sign     Worried About Running Out of Food in the Last Year: Never true     Ran Out of Food in the Last Year: Never true   Transportation Needs: No Transportation Needs (12/06/2022)    PRAPARE - Therapist, art (Medical): No     Lack of Transportation (Non-Medical): No   Intimate Partner Violence: Not At Risk (12/04/2022)    Humiliation, Afraid, Rape, and Kick questionnaire     Fear of Current or Ex-Partner: No     Emotionally Abused: No     Physically Abused: No     Sexually Abused: No   Housing Stability: Low Risk  (12/06/2022)    Housing Stability Vital Sign     Unable to Pay for Housing in the Last Year: No     Number  of Times Moved in the Last Year: 0     Homeless in the Last Year: No     Allergies   Allergen Reactions    Fentanyl Anaphylaxis     Hives    Percolone [Oxycodone]      Medications     Current Medications[1]     Review of systems     A comprehensive ten review of systems was: Negative in the neurologic, respiratory, cardiac, vascular, gastrointestinal, genitourinary, musculoskeletal, immunologic,psychiatricor endocrinologic systems except as mentioned above.     Physical Exam     There were no vitals filed for this visit.      General:  Patient appears their stated age, well-nourished.  Alert and in no apparent distress.    Psych: AAOX3, appropriate mood and affect.    Skin color appropriate for race, warm, dry.    Cardiac: RRR, carotid bruits not present, no JVD.     Abd: Soft, nondistended, nontender. No guarding or rebound, mid line pulsatile mass  No    Extremities:Full ROM, symmetric  Ischemic changes not present, Ulceration not present,  Gangrene not present, Peripheral edema: Yes  Pulses: Easily palpable pulses throughout bilaterally veins: Varicose veins No, hyperpigmentation No, lipo-dermatosclerosis No      Neuro: CN II-XII intact, gross  motor and sensory intact    Imaging   05/09/2023 Bilateral Lower Extremity Venous Insufficiency Duplex performed in our vascular lab:  ***    12/04/2022 MRA Neck performed at [facility]:  1.Stable MRI neck.  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   3.There is no stenosis of the proximal left internal carotid artery based on NASCET criteria.  4.The vertebral arteries are patent.    01/01/2021 Bilateral Lower Extremity Venous Insufficiency Duplex performed in our vascular lab:  No evidence of deep vein thrombosis seen bilaterally.  Bilateral peroneal veins not seen due to swelling.   No evidence of reflux seen in the deep veins bilaterally.   No evidence of reflux seen in the right great saphenous vein in the thigh.   The right great saphenous vein in the calf was harvested for CABG.  Greater than half a second of reflux seen in the right small saphenous vein from the sapheno-popliteal junction to the mid calf.   Greater than half a second of focal reflux seen in the left great saphenous vein at the sapheno-femoral junction.  No evidence of reflux seen in the left small saphenous vein.      05/12/2020 CTA Head/Neck performed at [facility]:  1. Left parietal scalp hematoma. No intracranial abnormality.   2. Normal CTA of the head.   3. Atherosclerotic disease at both carotid bifurcations with up to 60% stenosis on the right with an ulcerated plaque at the carotid bulb. Less than 50% stenosis on the left.     Assessment and Plan     Patient Active Problem List   Diagnosis    Headache    Mastoiditis    Dizziness       ***           [1]   Current Outpatient Medications:     acetaminophen (TYLENOL) 325 MG tablet, Take 2 tablets (650 mg) by mouth every 6 (six) hours as needed for Pain (or headache), Disp: , Rfl:     albuterol sulfate HFA (PROVENTIL) 108 (90 Base) MCG/ACT inhaler, , Disp: , Rfl:     aspirin EC 81 MG EC tablet, Take 1 tablet (81 mg)  by mouth daily, Disp: , Rfl:     calcium  carbonate (TUMS) 500 MG chewable tablet, Chew 1 tablet (500 mg) by mouth every 6 (six) hours as needed for Heartburn, Disp: , Rfl:     candesartan (ATACAND) 4 MG tablet, Take 1 tablet (4 mg) by mouth daily as needed (SBP > 160), Disp: , Rfl:     ergocalciferol (ERGOCALCIFEROL) 1.25 MG (50000 UT) capsule, Take 1 capsule (50,000 Units) by mouth once a week On Saturday, Disp: , Rfl:     fexofenadine (ALLEGRA) 180 MG tablet, Take 1 tablet (180 mg) by mouth daily, Disp: , Rfl:     fluticasone (Flonase Sensimist) 27.5 MCG/SPRAY nasal spray, 1 spray by Nasal route daily, Disp: , Rfl:     lidocaine (LIDODERM) 5 %, Place 1 patch onto the skin every 24 hours Remove & Discard patch within 12 hours or as directed by MD, Disp: , Rfl:     lidocaine (LIDODERM) 5 %, Place 1 patch onto the skin every 24 hours Remove & Discard patch within 12 hours or as directed by MD, Disp: 30 patch, Rfl: 0    lovastatin (MEVACOR) 40 MG tablet, Take 1 tablet (40 mg) by mouth nightly, Disp: , Rfl:     meclizine (ANTIVERT) 12.5 MG tablet, Take 2 tablets (25 mg) by mouth 3 (three) times daily as needed for Dizziness, Disp: 20 tablet, Rfl: 0    midodrine (PROAMATINE) 5 MG tablet, Take 1 tablet (5 mg) by mouth 3 (three) times daily As needed if SBP < 100 mm of HG, Disp: , Rfl:     Multiple Vitamins-Minerals (Centrum Silver 50+Women) Tab, Take 1 tablet by mouth daily, Disp: , Rfl:     pantoprazole (PROTONIX) 40 MG tablet, Take 1 tablet (40 mg) by mouth nightly, Disp: , Rfl:     polyethylene glycol (MIRALAX) 17 g packet, Take 17 g by mouth daily (Patient taking differently: Take 17 g by mouth 2 (two) times daily), Disp: , Rfl:     ranolazine (RANEXA) 500 MG 12 hr tablet, Take 1 tablet (500 mg) by mouth 2 (two) times daily, Disp: , Rfl:     sucralfate (CARAFATE) 1 g tablet, Take 1 tablet (1 g) by mouth every 6 (six) hours as needed (abdominal pain), Disp: 12 tablet, Rfl: 0    traMADol (ULTRAM) 50 MG tablet, Take 1 tablet (50 mg) by mouth every 8 (eight)  hours as needed for Pain, Disp: , Rfl:     venlafaxine (EFFEXOR-XR) 37.5 MG 24 hr capsule, Take 2 capsules (75 mg) by mouth nightly, Disp: , Rfl:

## 2023-04-20 ENCOUNTER — Ambulatory Visit
Admission: RE | Admit: 2023-04-20 | Discharge: 2023-04-20 | Disposition: A | Discharge status: Home / Self Care | Payer: Medicare Other | Source: Ambulatory Visit | Attending: Internal Medicine | Admitting: Internal Medicine

## 2023-04-20 DIAGNOSIS — I2583 Coronary atherosclerosis due to lipid rich plaque: Secondary | ICD-10-CM | POA: Insufficient documentation

## 2023-04-20 DIAGNOSIS — R0602 Shortness of breath: Secondary | ICD-10-CM | POA: Insufficient documentation

## 2023-04-20 DIAGNOSIS — I2510 Atherosclerotic heart disease of native coronary artery without angina pectoris: Secondary | ICD-10-CM | POA: Insufficient documentation

## 2023-04-20 LAB — NM MYOCARDIAL PERFUSION SPECT W STRESS AND REST: Stress LV Ejection Fraction: 68

## 2023-04-20 MED ORDER — TECHNETIUM TC 99M TETROFOSMIN IV KIT
34.0000 | PACK | Freq: Once | INTRAVENOUS | Status: AC | PRN
Start: 2023-04-20 — End: 2023-04-20
  Administered 2023-04-20: 34 via INTRAVENOUS

## 2023-04-20 MED ORDER — TECHNETIUM TC 99M TETROFOSMIN IV KIT
10.0000 | PACK | Freq: Once | INTRAVENOUS | Status: AC | PRN
Start: 2023-04-20 — End: 2023-04-20
  Administered 2023-04-20: 10 via INTRAVENOUS

## 2023-04-20 MED ORDER — REGADENOSON 0.4 MG/5ML IV SOLN
0.4000 mg | Freq: Once | INTRAVENOUS | Status: AC | PRN
Start: 2023-04-20 — End: 2023-04-20
  Administered 2023-04-20: 0.4 mg via INTRAVENOUS

## 2023-05-09 ENCOUNTER — Ambulatory Visit (INDEPENDENT_AMBULATORY_CARE_PROVIDER_SITE_OTHER): Payer: Medicare Other

## 2023-05-09 ENCOUNTER — Other Ambulatory Visit (INDEPENDENT_AMBULATORY_CARE_PROVIDER_SITE_OTHER): Payer: Medicare Other

## 2023-06-01 ENCOUNTER — Ambulatory Visit (INDEPENDENT_AMBULATORY_CARE_PROVIDER_SITE_OTHER): Payer: Medicare Other | Admitting: Internal Medicine

## 2023-06-06 ENCOUNTER — Ambulatory Visit: Payer: Medicare Other

## 2023-06-07 ENCOUNTER — Ambulatory Visit: Payer: Medicare Other | Attending: Family

## 2023-06-07 DIAGNOSIS — E782 Mixed hyperlipidemia: Secondary | ICD-10-CM | POA: Insufficient documentation

## 2023-06-07 DIAGNOSIS — I10 Essential (primary) hypertension: Secondary | ICD-10-CM | POA: Insufficient documentation

## 2023-06-07 DIAGNOSIS — E559 Vitamin D deficiency, unspecified: Secondary | ICD-10-CM | POA: Insufficient documentation

## 2023-06-07 DIAGNOSIS — E119 Type 2 diabetes mellitus without complications: Secondary | ICD-10-CM | POA: Insufficient documentation

## 2023-06-07 LAB — COMPREHENSIVE METABOLIC PANEL
ALT: 27 U/L (ref <=55)
AST (SGOT): 24 U/L (ref <=41)
Albumin/Globulin Ratio: 1 (ref 0.9–2.2)
Albumin: 3.4 g/dL — ABNORMAL LOW (ref 3.5–5.0)
Alkaline Phosphatase: 75 U/L (ref 37–117)
Anion Gap: 5 (ref 5.0–15.0)
BUN: 22 mg/dL — ABNORMAL HIGH (ref 7–21)
Bilirubin, Total: 0.4 mg/dL (ref 0.2–1.2)
CO2: 23 meq/L (ref 17–29)
Calcium: 9.4 mg/dL (ref 7.9–10.2)
Chloride: 106 meq/L (ref 99–111)
Creatinine: 1.4 mg/dL — ABNORMAL HIGH (ref 0.4–1.0)
GFR: 36.4 mL/min/{1.73_m2} — ABNORMAL LOW (ref >=60.0)
Globulin: 3.3 g/dL (ref 2.0–3.6)
Glucose: 122 mg/dL — ABNORMAL HIGH (ref 70–100)
Potassium: 4.8 meq/L (ref 3.5–5.3)
Protein, Total: 6.7 g/dL (ref 6.0–8.3)
Sodium: 134 meq/L — ABNORMAL LOW (ref 135–145)

## 2023-06-07 LAB — VITAMIN D, 25 OH, TOTAL: Vitamin D 25-OH, Total: 59 ng/mL (ref 30–100)

## 2023-06-07 LAB — LIPID PANEL
Cholesterol / HDL Ratio: 2 {index}
Cholesterol: 162 mg/dL (ref <=199)
HDL: 80 mg/dL (ref >=40)
LDL Calculated: 72 mg/dL (ref 0–99)
Triglycerides: 52 mg/dL (ref 34–149)
VLDL Calculated: 10 mg/dL (ref 10–40)

## 2023-06-07 LAB — LAB USE ONLY - CBC WITH DIFFERENTIAL
Absolute Basophils: 0.05 10*3/uL (ref 0.00–0.08)
Absolute Eosinophils: 0.54 10*3/uL — ABNORMAL HIGH (ref 0.00–0.44)
Absolute Immature Granulocytes: 0.01 10*3/uL (ref 0.00–0.07)
Absolute Lymphocytes: 1.49 10*3/uL (ref 0.42–3.22)
Absolute Monocytes: 0.37 10*3/uL (ref 0.21–0.85)
Absolute Neutrophils: 2.86 10*3/uL (ref 1.10–6.33)
Absolute nRBC: 0 10*3/uL (ref <=0.00)
Basophils %: 0.9 %
Eosinophils %: 10.2 %
Hematocrit: 29.1 % — ABNORMAL LOW (ref 34.7–43.7)
Hemoglobin: 9.8 g/dL — ABNORMAL LOW (ref 11.4–14.8)
Immature Granulocytes %: 0.2 %
Lymphocytes %: 28 %
MCH: 33.2 pg (ref 25.1–33.5)
MCHC: 33.7 g/dL (ref 31.5–35.8)
MCV: 98.6 fL — ABNORMAL HIGH (ref 78.0–96.0)
MPV: 9.6 fL (ref 8.9–12.5)
Monocytes %: 7 %
Neutrophils %: 53.7 %
Platelet Count: 191 10*3/uL (ref 142–346)
Preliminary Absolute Neutrophil Count: 2.86 10*3/uL (ref 1.10–6.33)
RBC: 2.95 10*6/uL — ABNORMAL LOW (ref 3.90–5.10)
RDW: 13 % (ref 11–15)
WBC: 5.32 10*3/uL (ref 3.10–9.50)
nRBC %: 0 /100{WBCs} (ref <=0.0)

## 2023-06-07 LAB — HEMOGLOBIN A1C
Average Estimated Glucose: 119.8 mg/dL
Hemoglobin A1C: 5.8 % — ABNORMAL HIGH (ref 4.6–5.6)

## 2023-06-07 LAB — TSH: TSH: 1.36 u[IU]/mL (ref 0.35–4.94)

## 2023-06-13 ENCOUNTER — Ambulatory Visit (INDEPENDENT_AMBULATORY_CARE_PROVIDER_SITE_OTHER): Payer: Medicare Other | Admitting: Internal Medicine

## 2023-06-21 ENCOUNTER — Emergency Department: Payer: Medicare Other

## 2023-06-21 ENCOUNTER — Emergency Department
Admission: EM | Admit: 2023-06-21 | Discharge: 2023-06-21 | Disposition: A | Discharge status: Skilled Nursing Facility | Payer: Medicare Other | Attending: Emergency Medicine | Admitting: Emergency Medicine

## 2023-06-21 ENCOUNTER — Emergency Department
Admission: EM | Admit: 2023-06-21 | Discharge: 2023-06-21 | Disposition: A | Discharge status: Home / Self Care | Payer: Medicare Other | Source: Home / Self Care | Attending: Student | Admitting: Student

## 2023-06-21 DIAGNOSIS — K529 Noninfective gastroenteritis and colitis, unspecified: Secondary | ICD-10-CM | POA: Insufficient documentation

## 2023-06-21 DIAGNOSIS — M25511 Pain in right shoulder: Secondary | ICD-10-CM | POA: Insufficient documentation

## 2023-06-21 LAB — LAB USE ONLY - CBC WITH DIFFERENTIAL
Absolute nRBC: 0 10*3/uL (ref <=0.00)
Hematocrit: 23.8 % — ABNORMAL LOW (ref 34.7–43.7)
Hemoglobin: 8.2 g/dL — ABNORMAL LOW (ref 11.4–14.8)
MCH: 33.9 pg — ABNORMAL HIGH (ref 25.1–33.5)
MCHC: 34.5 g/dL (ref 31.5–35.8)
MCV: 98.3 fL — ABNORMAL HIGH (ref 78.0–96.0)
MPV: 9.9 fL (ref 8.9–12.5)
Platelet Count: 183 10*3/uL (ref 142–346)
Preliminary Absolute Neutrophil Count: 5.93 10*3/uL (ref 1.10–6.33)
RBC: 2.42 10*6/uL — ABNORMAL LOW (ref 3.90–5.10)
RDW: 13 % (ref 11–15)
WBC: 7.47 10*3/uL (ref 3.10–9.50)
nRBC %: 0 /100{WBCs} (ref <=0.0)

## 2023-06-21 LAB — LAB USE ONLY - MANUAL DIFFERENTIAL
Absolute Bands: 1.57 10*3/uL — ABNORMAL HIGH (ref 0.00–1.00)
Absolute Basophils: 0 10*3/uL (ref 0.00–0.08)
Absolute Eosinophils: 0 10*3/uL (ref 0.00–0.44)
Absolute Lymphocytes: 1.34 10*3/uL (ref 0.42–3.22)
Absolute Monocytes: 0.45 10*3/uL (ref 0.21–0.85)
Absolute Neutrophils: 4.11 10*3/uL (ref 1.10–6.33)
Bands %: 21 %
Basophils %: 0 %
Eosinophils %: 0 %
Lymphocytes %: 18 %
Monocytes %: 6 %
Neutrophils %: 55 %
Platelet Estimate: NORMAL
RBC Morphology: ABNORMAL — AB

## 2023-06-21 LAB — URINALYSIS WITH REFLEX TO MICROSCOPIC EXAM - REFLEX TO CULTURE
Urine Bilirubin: NEGATIVE
Urine Blood: NEGATIVE
Urine Glucose: NEGATIVE
Urine Ketones: NEGATIVE mg/dL
Urine Leukocyte Esterase: NEGATIVE
Urine Nitrite: NEGATIVE
Urine Specific Gravity: 1.034 (ref 1.001–1.035)
Urine Urobilinogen: NORMAL mg/dL (ref 0.2–2.0)
Urine pH: 6 (ref 5.0–8.0)

## 2023-06-21 LAB — COMPREHENSIVE METABOLIC PANEL
ALT: 23 U/L (ref <=55)
AST (SGOT): 25 U/L (ref <=41)
Albumin/Globulin Ratio: 0.9 (ref 0.9–2.2)
Albumin: 3 g/dL — ABNORMAL LOW (ref 3.5–5.0)
Alkaline Phosphatase: 54 U/L (ref 37–117)
Anion Gap: 8 (ref 5.0–15.0)
BUN: 34 mg/dL — ABNORMAL HIGH (ref 7–21)
Bilirubin, Total: 0.4 mg/dL (ref 0.2–1.2)
CO2: 22 meq/L (ref 17–29)
Calcium: 8.9 mg/dL (ref 7.9–10.2)
Chloride: 106 meq/L (ref 99–111)
Creatinine: 1.3 mg/dL — ABNORMAL HIGH (ref 0.4–1.0)
GFR: 39.8 mL/min/{1.73_m2} — ABNORMAL LOW (ref >=60.0)
Globulin: 3.5 g/dL (ref 2.0–3.6)
Glucose: 137 mg/dL — ABNORMAL HIGH (ref 70–100)
Potassium: 4.7 meq/L (ref 3.5–5.3)
Protein, Total: 6.5 g/dL (ref 6.0–8.3)
Sodium: 136 meq/L (ref 135–145)

## 2023-06-21 LAB — COVID-19 (SARS-COV-2) & INFLUENZA  A/B, NAA (ROCHE LIAT)
Influenza A RNA: NOT DETECTED
Influenza B RNA: NOT DETECTED
SARS-CoV-2 (COVID-19) RNA: NOT DETECTED

## 2023-06-21 MED ORDER — SODIUM CHLORIDE 0.9 % IV BOLUS
1000.0000 mL | Freq: Once | INTRAVENOUS | Status: AC
Start: 2023-06-21 — End: 2023-06-21
  Administered 2023-06-21: 1000 mL via INTRAVENOUS

## 2023-06-21 MED ORDER — ONDANSETRON 4 MG PO TBDP
4.0000 mg | ORAL_TABLET | Freq: Four times a day (QID) | ORAL | 0 refills | Status: AC | PRN
Start: 2023-06-21 — End: ?

## 2023-06-21 MED ORDER — IOHEXOL 350 MG/ML IV SOLN
100.0000 mL | Freq: Once | INTRAVENOUS | Status: AC | PRN
Start: 2023-06-21 — End: 2023-06-21
  Administered 2023-06-21: 100 mL via INTRAVENOUS

## 2023-06-21 MED ORDER — ONDANSETRON HCL 4 MG/2ML IJ SOLN
4.0000 mg | Freq: Once | INTRAMUSCULAR | Status: AC
Start: 2023-06-21 — End: 2023-06-21
  Administered 2023-06-21: 4 mg via INTRAVENOUS
  Filled 2023-06-21: qty 2

## 2023-06-21 MED ORDER — LIDOCAINE 5 % EX PTCH
1.0000 | MEDICATED_PATCH | Freq: Once | CUTANEOUS | Status: DC
Start: 2023-06-21 — End: 2023-06-21
  Administered 2023-06-21: 1 via TRANSDERMAL
  Filled 2023-06-21: qty 1

## 2023-06-21 MED ORDER — ACETAMINOPHEN 500 MG PO TABS
1000.0000 mg | ORAL_TABLET | Freq: Once | ORAL | Status: DC
Start: 2023-06-21 — End: 2023-06-21

## 2023-06-21 NOTE — ED Notes (Signed)
 Bed: E14  Expected date:   Expected time:   Means of arrival:   Comments:  [place]

## 2023-06-21 NOTE — Discharge Instructions (Signed)
 You have been evaluated in the Emergency Department today  for right shoulder pain. Your evaluation showed a no fracture.    If you have worsening pain, any new swelling, redness, warmth or other concerning signs or symptoms of your shoulder please return to the ED    Please take ibuprofen and tylenol  as discussed.     Please schedule an appointment for follow up with your primary care doctor.

## 2023-06-21 NOTE — ED Notes (Signed)
Bed: E12  Expected date:   Expected time:   Means of arrival:   Comments:  409

## 2023-06-21 NOTE — ED Provider Notes (Signed)
 EMERGENCY DEPARTMENT NOTE     Patient initially seen and examined at   ED PHYSICIAN ASSIGNED       Date/Time Event User Comments    06/21/23 1445 Physician Assigned Demaryius Imran P Terri Chiquita SQUIBB, MD assigned as Attending           ED MIDLEVEL (APP) ASSIGNED       None            HISTORY OF PRESENT ILLNESS       Chief Complaint: Diarrhea and Emesis       83 y.o. female with past medical history as below here for evaluation of continued symptoms of nausea vomiting and diarrhea over the last 3 to 4 days.  Of note, she was seen earlier in the emergency department today , and was sent home after being diagnosed with gastroenteritis.  She returns because daughter feels that patient did not receive complete evaluation.  Patient reports no new interim symptoms since visit this morning.    Independent Historian (other than patient):     MEDICAL HISTORY     Past Medical History:  Past Medical History:   Diagnosis Date    Diabetes mellitus     Gastroesophageal reflux disease     Hyperlipidemia     Hypertension     Hypotension     Seasonal allergic rhinitis     Syncope        Past Surgical History:  Past Surgical History[1]    Social History:  Social History[2]    Family History:  Family History[3]    Outpatient Medication:  Previous Medications    ACETAMINOPHEN  (TYLENOL ) 325 MG TABLET    Take 2 tablets (650 mg) by mouth every 6 (six) hours as needed for Pain (or headache)    ALBUTEROL  SULFATE HFA (PROVENTIL ) 108 (90 BASE) MCG/ACT INHALER        ASPIRIN  EC 81 MG EC TABLET    Take 1 tablet (81 mg) by mouth daily    CALCIUM  CARBONATE (TUMS) 500 MG CHEWABLE TABLET    Chew 1 tablet (500 mg) by mouth every 6 (six) hours as needed for Heartburn    CANDESARTAN  (ATACAND ) 4 MG TABLET    Take 1 tablet (4 mg) by mouth daily as needed (SBP > 160)    ERGOCALCIFEROL  (ERGOCALCIFEROL ) 1.25 MG (50000 UT) CAPSULE    Take 1 capsule (50,000 Units) by mouth once a week On Saturday    FEXOFENADINE (ALLEGRA) 180 MG TABLET    Take 1 tablet (180 mg)  by mouth daily    FLUTICASONE  (FLONASE  SENSIMIST) 27.5 MCG/SPRAY NASAL SPRAY    1 spray by Nasal route daily    LIDOCAINE  (LIDODERM ) 5 %    Place 1 patch onto the skin every 24 hours Remove & Discard patch within 12 hours or as directed by MD    LIDOCAINE  (LIDODERM ) 5 %    Place 1 patch onto the skin every 24 hours Remove & Discard patch within 12 hours or as directed by MD    LOVASTATIN (MEVACOR) 40 MG TABLET    Take 1 tablet (40 mg) by mouth nightly    MECLIZINE  (ANTIVERT ) 12.5 MG TABLET    Take 2 tablets (25 mg) by mouth 3 (three) times daily as needed for Dizziness    MIDODRINE  (PROAMATINE ) 5 MG TABLET    Take 1 tablet (5 mg) by mouth 3 (three) times daily As needed if SBP < 100 mm of HG    MULTIPLE VITAMINS-MINERALS (CENTRUM  SILVER 50+WOMEN) TAB    Take 1 tablet by mouth daily    PANTOPRAZOLE  (PROTONIX ) 40 MG TABLET    Take 1 tablet (40 mg) by mouth nightly    POLYETHYLENE GLYCOL (MIRALAX ) 17 G PACKET    Take 17 g by mouth daily    RANOLAZINE  (RANEXA ) 500 MG 12 HR TABLET    Take 1 tablet (500 mg) by mouth 2 (two) times daily    SUCRALFATE  (CARAFATE ) 1 G TABLET    Take 1 tablet (1 g) by mouth every 6 (six) hours as needed (abdominal pain)    TRAMADOL  (ULTRAM ) 50 MG TABLET    Take 1 tablet (50 mg) by mouth every 8 (eight) hours as needed for Pain    VENLAFAXINE  (EFFEXOR -XR) 37.5 MG 24 HR CAPSULE    Take 2 capsules (75 mg) by mouth nightly         REVIEW OF SYSTEMS   Review of Systems  12 point comprehensive review of systems is negative for any additional symptoms or complaints  PHYSICAL EXAM     ED Triage Vitals   Encounter Vitals Group      BP 06/21/23 1442 157/73      Systolic BP Percentile --       Diastolic BP Percentile --       Heart Rate 06/21/23 1442 75      Resp Rate 06/21/23 1441 20      Temp 06/21/23 1441 98.2 F (36.8 C)      Temp src 06/21/23 1441 Oral      SpO2 06/21/23 1442 96 %      Weight 06/21/23 1442 86.9 kg      Height --       Head Circumference --       Peak Flow --       Pain Score  06/21/23 1442 4      Pain Loc --       Pain Education --       Exclude from Growth Chart --        Nursing note and vitals reviewed.    Constitutional: Vital signs reviewed. Well appearing.  Head: Normocephalic, atraumatic  Eyes: Conjunctiva and sclera are normal.  No injection or discharge.  Ears, Nose, Throat:  Normal external examination of the nose and ears.    Neck: Normal range of motion. Trachea midline.  Respiratory/Chest: Clear to auscultation. No respiratory distress.   Cardiovascular: Regular rate and rhythm. No murmurs.   Abdomen:  No rebound or guarding. Soft.  Non-tender.  Back:    Upper Extremity:  No edema. No cyanosis.  Lower Extremity:  No edema. No cyanosis.  Skin: Warm and dry. No rash.  Psychiatric:  Normal affect.  Normal insight.      MEDICAL DECISION MAKING     PRIMARY PROBLEM LIST     Acute illness/injury DIAGNOSIS:viral syndrome      DISCUSSION    External chart reviewed: Patient was diagnosed with gastroenteritis, had a right shoulder x-ray which showed no acute fracture and was given lidocaine  patch and Tylenol  and reported improvement of symptoms.  After having a discussion with patient regarding concerns and expectations, we will pursue CT scan for further diagnostic evaluation.    Patient presents with complaints and signs consistent with viral syndrome without signs of bacterial infection. At this time, other emergency conditions sufficiently ruled out at this point, and patient will be discharged with instructions to keep well hydrated, rest, and take antipyretics as directed  for fevers. Otherwise is stable to follow up with primary care doctor in the next 48 hours for re-evaluation, and was given strict return precautions for any new or worsening symptoms. Patient understands plan and will follow up as instructed, is happy with their care here, and has no further questions or concerns at this time.              Evaluation                       Vital Signs: Reviewed the patient's  vital signs.   Nursing Notes: Reviewed and utilized available nursing notes.  Medical Records Reviewed: Reviewed available past medical records.  Counseling: The emergency provider has spoken with the patient and discussed today 's findings, in addition to providing specific details for the plan of care.  Questions are answered and there is agreement with the plan.      MIPS DOCUMENTATION                EMERGENCY DEPT. MEDICATIONS      ED Medication Orders (From admission, onward)      Start Ordered     Status Ordering Provider    06/21/23 1458 06/21/23 1457  sodium chloride  0.9 % bolus 1,000 mL  Once        Route: Intravenous  Ordered Dose: 1,000 mL       Last MAR action: New Bag Martine Trageser P    06/21/23 1458 06/21/23 1457  ondansetron  (ZOFRAN ) injection 4 mg  Once        Route: Intravenous  Ordered Dose: 4 mg       Last MAR action: Given Cylah Fannin P            LABORATORY RESULTS    Ordered and independently interpreted AVAILABLE laboratory tests.   Results for orders placed or performed during the hospital encounter of 06/21/23   COVID-19 and Influenza ([place]) (symptomatic)    Collection Time: 06/21/23  3:45 PM    Specimen: Anterior Nares; Swab   Result Value Ref Range    SARS-CoV-2 (COVID-19) RNA Not Detected Not Detected    Influenza A RNA Not Detected Not Detected    Influenza B RNA Not Detected Not Detected       CRITICAL CARE/PROCEDURES    Procedures    DIAGNOSIS      Diagnosis:  Final diagnoses:   None       Disposition:  ED Disposition       None            Prescriptions:  Patient's Medications   New Prescriptions    No medications on file   Previous Medications    ACETAMINOPHEN  (TYLENOL ) 325 MG TABLET    Take 2 tablets (650 mg) by mouth every 6 (six) hours as needed for Pain (or headache)    ALBUTEROL  SULFATE HFA (PROVENTIL ) 108 (90 BASE) MCG/ACT INHALER        ASPIRIN  EC 81 MG EC TABLET    Take 1 tablet (81 mg) by mouth daily    CALCIUM  CARBONATE (TUMS) 500 MG CHEWABLE TABLET    Chew 1 tablet (500  mg) by mouth every 6 (six) hours as needed for Heartburn    CANDESARTAN  (ATACAND ) 4 MG TABLET    Take 1 tablet (4 mg) by mouth daily as needed (SBP > 160)    ERGOCALCIFEROL  (ERGOCALCIFEROL ) 1.25 MG (50000 UT) CAPSULE    Take 1 capsule (50,000 Units) by mouth  once a week On Saturday    FEXOFENADINE (ALLEGRA) 180 MG TABLET    Take 1 tablet (180 mg) by mouth daily    FLUTICASONE  (FLONASE  SENSIMIST) 27.5 MCG/SPRAY NASAL SPRAY    1 spray by Nasal route daily    LIDOCAINE  (LIDODERM ) 5 %    Place 1 patch onto the skin every 24 hours Remove & Discard patch within 12 hours or as directed by MD    LIDOCAINE  (LIDODERM ) 5 %    Place 1 patch onto the skin every 24 hours Remove & Discard patch within 12 hours or as directed by MD    LOVASTATIN (MEVACOR) 40 MG TABLET    Take 1 tablet (40 mg) by mouth nightly    MECLIZINE  (ANTIVERT ) 12.5 MG TABLET    Take 2 tablets (25 mg) by mouth 3 (three) times daily as needed for Dizziness    MIDODRINE  (PROAMATINE ) 5 MG TABLET    Take 1 tablet (5 mg) by mouth 3 (three) times daily As needed if SBP < 100 mm of HG    MULTIPLE VITAMINS-MINERALS (CENTRUM SILVER 50+WOMEN) TAB    Take 1 tablet by mouth daily    PANTOPRAZOLE  (PROTONIX ) 40 MG TABLET    Take 1 tablet (40 mg) by mouth nightly    POLYETHYLENE GLYCOL (MIRALAX ) 17 G PACKET    Take 17 g by mouth daily    RANOLAZINE  (RANEXA ) 500 MG 12 HR TABLET    Take 1 tablet (500 mg) by mouth 2 (two) times daily    SUCRALFATE  (CARAFATE ) 1 G TABLET    Take 1 tablet (1 g) by mouth every 6 (six) hours as needed (abdominal pain)    TRAMADOL  (ULTRAM ) 50 MG TABLET    Take 1 tablet (50 mg) by mouth every 8 (eight) hours as needed for Pain    VENLAFAXINE  (EFFEXOR -XR) 37.5 MG 24 HR CAPSULE    Take 2 capsules (75 mg) by mouth nightly   Modified Medications    No medications on file   Discontinued Medications    No medications on file       This note was generated by the Epic EMR system/ Dragon speech recognition and may contain inherent errors or omissions not  intended by the user. Grammatical errors, random word insertions, deletions and pronoun errors  are occasional consequences of this technology due to software limitations. Not all errors are caught or corrected. If there are questions or concerns about the content of this note or information contained within the body of this dictation they should be addressed directly with the author for clarification.         [1]   Past Surgical History:  Procedure Laterality Date    BREAST BIOPSY Right     2014    CORONARY ARTERY BYPASS      HYSTERECTOMY      partial-1970's   [2]   Social History  Socioeconomic History    Marital status: Widowed   Tobacco Use    Smoking status: Former     Current packs/day: 0.00     Types: Cigarettes     Quit date: 1990     Years since quitting: 35.1    Smokeless tobacco: Never   Vaping Use    Vaping status: Never Used   Substance and Sexual Activity    Alcohol use: Yes     Comment: occasionally    Drug use: Never     Social Drivers of Psychologist, [employer]  Strain: Low Risk  (12/06/2022)    Overall Financial Resource Strain (CARDIA)     Difficulty of Paying Living Expenses: Not hard at all   Food Insecurity: No Food Insecurity (12/04/2022)    Hunger Vital Sign     Worried About Running Out of Food in the Last Year: Never true     Ran Out of Food in the Last Year: Never true   Transportation Needs: No Transportation Needs (12/06/2022)    PRAPARE - Therapist, Art (Medical): No     Lack of Transportation (Non-Medical): No   Intimate Partner Violence: Not At Risk (12/04/2022)    Humiliation, Afraid, Rape, and Kick questionnaire     Fear of Current or Ex-Partner: No     Emotionally Abused: No     Physically Abused: No     Sexually Abused: No   Housing Stability: Low Risk  (12/06/2022)    Housing Stability Vital Sign     Unable to Pay for Housing in the Last Year: No     Number of Times Moved in the Last Year: 0     Homeless in the Last Year: No   [3]   Family History  Problem  Relation Name Age of Onset    Heart failure Mother      Myocardial Infarction Father      Breast cancer Sister          Terri Chiquita SQUIBB, MD  06/21/23 [phone]

## 2023-06-21 NOTE — ED Notes (Signed)
 MD at bedside.

## 2023-06-21 NOTE — ED Triage Notes (Signed)
 Pt BIBA from facility. Pt was seen here earlier today  for right arm pain, and [place] back to facility. Pt's daughter was concerned that pt was not given enough care and called 911 again. Medics report N/V/D since Monday, and increased urination on Friday, even though she just finished antibiotics for a uti. Pt is alert and oriented x 4. VS en route: 77,158/80,97%, dexi 120, T 100.5. Daughter is present this time.

## 2023-06-21 NOTE — ED Provider Notes (Signed)
 EMERGENCY DEPARTMENT NOTE     Patient initially seen and examined at   ED PHYSICIAN ASSIGNED       Date/Time Event User Comments    06/21/23 0914 Physician Assigned GARON, ROLANDO Garon Rolando, MD assigned as Attending           ED [facility] (APP) ASSIGNED       None            HISTORY OF PRESENT ILLNESS     Chief Complaint: Generalized weakness and Arm Pain       Vanessa Kelly is a 83 y.o. female with a PMH of DM, HTN  presenting with shoulder pain. Patient states since Sunday she has been having n/v/d. She said it got better last night. Today  she went to get up ad felt she was about to have another round of diarrhea. She states she sat back down on to her chair. She then realized she was having pain to the right shoulder and had trouble lifting it so she called the nursing station who sent patient to the ED.       Severity of CC: mild  History obtained ab:[id number]  Translator used: No    MEDICAL HISTORY     Past Medical History:  Past Medical History:   Diagnosis Date    Diabetes mellitus     Gastroesophageal reflux disease     Hyperlipidemia     Hypertension     Hypotension     Seasonal allergic rhinitis     Syncope        Past Surgical History:  Past Surgical History[1]    Social History:  Social History[2]    Family History:  Family History[3]    Outpatient Medication:  Discharge Medication List as of 06/21/2023 12:30 PM        CONTINUE these medications which have NOT CHANGED    Details   acetaminophen  (TYLENOL ) 325 MG tablet Take 2 tablets (650 mg) by mouth every 6 (six) hours as needed for Pain (or headache), Historical Med      albuterol  sulfate HFA (PROVENTIL ) 108 (90 Base) MCG/ACT inhaler Historical Med      aspirin  EC 81 MG EC tablet Take 1 tablet (81 mg) by mouth daily, Historical Med      calcium  carbonate (TUMS) 500 MG chewable tablet Chew 1 tablet (500 mg) by mouth every 6 (six) hours as needed for Heartburn, Historical Med      candesartan  (ATACAND ) 4 MG tablet Take 1 tablet (4 mg) by mouth daily  as needed (SBP > 160), Historical Med      ergocalciferol  (ERGOCALCIFEROL ) 1.25 MG (50000 UT) capsule Take 1 capsule (50,000 Units) by mouth once a week On Saturday, Historical Med      fexofenadine (ALLEGRA) 180 MG tablet Take 1 tablet (180 mg) by mouth daily, Historical Med      fluticasone  (Flonase  Sensimist) 27.5 MCG/SPRAY nasal spray 1 spray by Nasal route daily, Historical Med      !! lidocaine  (LIDODERM ) 5 % Place 1 patch onto the skin every 24 hours Remove & Discard patch within 12 hours or as directed by MD, Historical Med      !! lidocaine  (LIDODERM ) 5 % Place 1 patch onto the skin every 24 hours Remove & Discard patch within 12 hours or as directed by MD, Starting Thu 10/14/2021, E-Rx      lovastatin (MEVACOR) 40 MG tablet Take 1 tablet (40 mg) by mouth nightly, Historical Med  meclizine  (ANTIVERT ) 12.5 MG tablet Take 2 tablets (25 mg) by mouth 3 (three) times daily as needed for Dizziness, Starting Tue 12/06/2022, E-Rx      midodrine  (PROAMATINE ) 5 MG tablet Take 1 tablet (5 mg) by mouth 3 (three) times daily As needed if SBP < 100 mm of HG, Starting Wed 06/29/2022, No Print      Multiple Vitamins-Minerals (Centrum Silver 50+Women) Tab Take 1 tablet by mouth daily, Historical Med      pantoprazole  (PROTONIX ) 40 MG tablet Take 1 tablet (40 mg) by mouth nightly, Historical Med      polyethylene glycol (MIRALAX ) 17 g packet Take 17 g by mouth daily, Starting Wed 02/03/2021, No Print      ranolazine  (RANEXA ) 500 MG 12 hr tablet Take 1 tablet (500 mg) by mouth 2 (two) times daily, Historical Med      sucralfate  (CARAFATE ) 1 g tablet Take 1 tablet (1 g) by mouth every 6 (six) hours as needed (abdominal pain), Starting Tue 01/18/2022, Normal      traMADol  (ULTRAM ) 50 MG tablet Take 1 tablet (50 mg) by mouth every 8 (eight) hours as needed for Pain, Historical Med      venlafaxine  (EFFEXOR -XR) 37.5 MG 24 hr capsule Take 2 capsules (75 mg) by mouth nightly, Historical Med       !! - Potential duplicate medications  found. Please discuss with provider.            REVIEW OF SYSTEMS   ROS:   CONSTITUTIONAL:  no fever  HEENT: no rhinorrhea  EYES: no eye reddness  CARDS: normal heart rate  RESP:  normal work of breathing  GI:  n/v/d  GU: normal urineoutput  SKIN: no rash  NEURO: normal weakness/ tone  HEME: no abnormal bruising   PHYSICAL EXAM     ED Triage Vitals [06/21/23 0914]   Encounter Vitals Group      BP 138/63      Systolic BP Percentile       Diastolic BP Percentile       Heart Rate 72      Resp Rate 17      Temp       Temp src Oral      SpO2 100 %      Weight       Height       Head Circumference       Peak Flow       Pain Score 6      Pain Loc       Pain Education       Exclude from Growth Chart        Physical Exam   Constitutional: No acute distress  HENT:   Head: Normocephalic and atraumatic.   Eyes: Conjunctivae are normal. PERRL. No scleral icterus.   Cardiovascular: Normal rate and regular rhythm. No murmur heard.  Pulmonary: Clear to auscultation b/l. No respiratory distress.   Abdominal: Soft, non-tender, non-distended  Musculoskeletal: TTP over the right shoulder and clavicle. Unable to range arm to 90 degrees actively but I am able to passively range arm past 90 degrees without pain  Neurological: Awake, alert, motor/sensory grossly intact.  Skin: Skin is warm and dry.   Psychiatric: Behavior is normal. Thought content normal.     MEDICAL DECISION MAKING     Vanessa Kelly is a 83 y.o. female presenting with right shoulder pain.    Patient with reproducible tenderness and pain with active but not  passive ROM. Do favor MSK etiology. Can get XR of the right shoulder and clavicle. No numbness and given exam, this is not consistent with a stroke. N/v/d so can get basic albs to look for metabolic derangement. No fevers, bloody stool to suggest bacterial enteritis.      Acute illness/injury with risk to life or bodily function (based on differential diagnosis or evaluation)      ED Course as of 06/21/23 1429    Wed Jun 21, 2023   1044 I have independently reviewed the labs: Basic labs unremarkable    [GR]   1125 Patient reports feeling better.  [GR]      ED Course User Index  [GR] Garon Eden, MD           Evaluation    External Records Reviewed: Unable to identify pertinent records  Date of Records reviewed: N/A      Vital Signs: Reviewed the patient's vital signs.   Nursing Notes: Reviewed and utilized available nursing notes.    CARDIAC STUDIES        PROCEDURES    Procedures    EMERGENCY IMAGING STUDIES      I have personally read and interpreted the XR: I do not see any evidence of fracture or other significant/acute abnormality    RADIOLOGY IMAGING STUDIES      Clavicle Right   Final Result      1.No evidence for fracture or dislocation of the right clavicle.      2.No evidence for periprosthetic fracture or osteolysis of the right   shoulder arthroplasty. However, osteopenia limits evaluation. If there is   persistent clinical concern for occult fracture or osteolysis, further   evaluation with CT or MRI is recommended.      Murray Sessions, MD   06/21/2023 10:49 AM      Shoulder Right 2+ Views   Final Result      1.No evidence for fracture or dislocation of the right clavicle.      2.No evidence for periprosthetic fracture or osteolysis of the right   shoulder arthroplasty. However, osteopenia limits evaluation. If there is   persistent clinical concern for occult fracture or osteolysis, further   evaluation with CT or MRI is recommended.      Murray Sessions, MD   06/21/2023 10:49 AM          EMERGENCY DEPT. MEDICATIONS      ED Medication Orders (From admission, onward)      Start Ordered     Status Ordering Provider    06/21/23 [phone] 06/21/23 0928  lidocaine  (LIDODERM ) 5 % 1 patch  Once        Route: Transdermal  Ordered Dose: 1 patch       Last MAR action: Patch Applied Otila Starn    06/21/23 0927 06/21/23 0926    Once        Route: Oral  Ordered Dose: 1,000 mg       Discontinued Latonja Bobeck             LABORATORY RESULTS    Ordered and independently interpreted AVAILABLE laboratory tests.   Results for orders placed or performed during the hospital encounter of 06/21/23   Comprehensive Metabolic Panel    Collection Time: 06/21/23  9:44 AM   Result Value Ref Range    Glucose 137 (H) 70 - 100 mg/dL    BUN 34 (H) 7 - 21 mg/dL    Creatinine 1.3 (H) 0.4 - 1.0  mg/dL    Sodium 863 864 - 854 mEq/L    Potassium 4.7 3.5 - 5.3 mEq/L    Chloride 106 99 - 111 mEq/L    CO2 22 17 - 29 mEq/L    Calcium  8.9 7.9 - 10.2 mg/dL    Anion Gap 8.0 5.0 - 15.0    GFR 39.8 (L) >=60.0 mL/min/1.73 m2    AST (SGOT) 25 <=41 U/L    ALT 23 <=55 U/L    Alkaline Phosphatase 54 37 - 117 U/L    Albumin 3.0 (L) 3.5 - 5.0 g/dL    Protein, Total 6.5 6.0 - 8.3 g/dL    Globulin 3.5 2.0 - 3.6 g/dL    Albumin/Globulin Ratio 0.9 0.9 - 2.2    Bilirubin, Total 0.4 0.2 - 1.2 mg/dL   CBC with Differential (Component)    Collection Time: 06/21/23  9:44 AM   Result Value Ref Range    WBC 7.47 3.10 - 9.50 x10 3/uL    Hemoglobin 8.2 (L) 11.4 - 14.8 g/dL    Hematocrit 76.1 (L) 34.7 - 43.7 %    Platelet Count 183 142 - 346 x10 3/uL    MPV 9.9 8.9 - 12.5 fL    RBC 2.42 (L) 3.90 - 5.10 x10 6/uL    MCV 98.3 (H) 78.0 - 96.0 fL    MCH 33.9 (H) 25.1 - 33.5 pg    MCHC 34.5 31.5 - 35.8 g/dL    RDW 13 11 - 15 %    nRBC % 0.0 <=0.0 /100 WBC    Absolute nRBC 0.00 <=0.00 x10 3/uL    Preliminary Absolute Neutrophil Count 5.93 1.10 - 6.33 x10 3/uL   Manual Differential (Component)    Collection Time: 06/21/23  9:44 AM   Result Value Ref Range    Neutrophils % 55 Not Established %    Lymphocytes % 18 Not Established %    Monocytes % 6 Not Established %    Eosinophils % 0 Not Established %    Basophils % 0 Not Established %    Bands % 21 Not Established %    Absolute Neutrophils 4.11 1.10 - 6.33 x10 3/uL    Absolute Lymphocytes 1.34 0.42 - 3.22 x10 3/uL    Absolute Monocytes 0.45 0.21 - 0.85 x10 3/uL    Absolute Eosinophils 0.00 0.00 - 0.44 x10 3/uL    Absolute Basophils 0.00  0.00 - 0.08 x10 3/uL    Absolute Bands 1.57 (H) 0.00 - 1.00 x10 3/uL    RBC Morphology Abnormal (A) Normal    Hypochromia 2+ (A) (none)    Ovalocytes Present (A) (none)    Platelet Estimate Normal Normal       DIAGNOSIS      Diagnosis:  Final diagnoses:   Gastroenteritis   Acute pain of right shoulder       Disposition:  ED Disposition       ED Disposition   Discharge    Condition   --    Date/Time   Wed Jun 21, 2023 11:32 AM    Comment   [name] discharge to home/self care.    Condition at disposition: Stable                 Prescriptions:  Discharge Medication List as of 06/21/2023 12:30 PM        CONTINUE these medications which have NOT CHANGED    Details   acetaminophen  (TYLENOL ) 325 MG tablet Take 2 tablets (650  mg) by mouth every 6 (six) hours as needed for Pain (or headache), Historical Med      albuterol  sulfate HFA (PROVENTIL ) 108 (90 Base) MCG/ACT inhaler Historical Med      aspirin  EC 81 MG EC tablet Take 1 tablet (81 mg) by mouth daily, Historical Med      calcium  carbonate (TUMS) 500 MG chewable tablet Chew 1 tablet (500 mg) by mouth every 6 (six) hours as needed for Heartburn, Historical Med      candesartan  (ATACAND ) 4 MG tablet Take 1 tablet (4 mg) by mouth daily as needed (SBP > 160), Historical Med      ergocalciferol  (ERGOCALCIFEROL ) 1.25 MG (50000 UT) capsule Take 1 capsule (50,000 Units) by mouth once a week On Saturday, Historical Med      fexofenadine (ALLEGRA) 180 MG tablet Take 1 tablet (180 mg) by mouth daily, Historical Med      fluticasone  (Flonase  Sensimist) 27.5 MCG/SPRAY nasal spray 1 spray by Nasal route daily, Historical Med      !! lidocaine  (LIDODERM ) 5 % Place 1 patch onto the skin every 24 hours Remove & Discard patch within 12 hours or as directed by MD, Historical Med      !! lidocaine  (LIDODERM ) 5 % Place 1 patch onto the skin every 24 hours Remove & Discard patch within 12 hours or as directed by MD, Starting Thu 10/14/2021, E-Rx      lovastatin (MEVACOR) 40 MG  tablet Take 1 tablet (40 mg) by mouth nightly, Historical Med      meclizine  (ANTIVERT ) 12.5 MG tablet Take 2 tablets (25 mg) by mouth 3 (three) times daily as needed for Dizziness, Starting Tue 12/06/2022, E-Rx      midodrine  (PROAMATINE ) 5 MG tablet Take 1 tablet (5 mg) by mouth 3 (three) times daily As needed if SBP < 100 mm of HG, Starting Wed 06/29/2022, No Print      Multiple Vitamins-Minerals (Centrum Silver 50+Women) Tab Take 1 tablet by mouth daily, Historical Med      pantoprazole  (PROTONIX ) 40 MG tablet Take 1 tablet (40 mg) by mouth nightly, Historical Med      polyethylene glycol (MIRALAX ) 17 g packet Take 17 g by mouth daily, Starting Wed 02/03/2021, No Print      ranolazine  (RANEXA ) 500 MG 12 hr tablet Take 1 tablet (500 mg) by mouth 2 (two) times daily, Historical Med      sucralfate  (CARAFATE ) 1 g tablet Take 1 tablet (1 g) by mouth every 6 (six) hours as needed (abdominal pain), Starting Tue 01/18/2022, Normal      traMADol  (ULTRAM ) 50 MG tablet Take 1 tablet (50 mg) by mouth every 8 (eight) hours as needed for Pain, Historical Med      venlafaxine  (EFFEXOR -XR) 37.5 MG 24 hr capsule Take 2 capsules (75 mg) by mouth nightly, Historical Med       !! - Potential duplicate medications found. Please discuss with provider.          This note was generated by the Epic EMR system/ Dragon speech recognition and Vanessa contain inherent errors or omissions not intended by the user. Grammatical errors, random word insertions, deletions and pronoun errors  are occasional consequences of this technology due to software limitations. Not all errors are caught or corrected. If there are questions or concerns about the content of this note or information contained within the body of this dictation they should be addressed directly with the author for clarification.       [  1]   Past Surgical History:  Procedure Laterality Date    BREAST BIOPSY Right     2014    CORONARY ARTERY BYPASS      HYSTERECTOMY      partial-1970's    [2]   Social History  Socioeconomic History    Marital status: Widowed   Tobacco Use    Smoking status: Former     Current packs/day: 0.00     Types: Cigarettes     Quit date: 1990     Years since quitting: 35.1    Smokeless tobacco: Never   Vaping Use    Vaping status: Never Used   Substance and Sexual Activity    Alcohol use: Yes     Comment: occasionally    Drug use: Never     Social Drivers of Psychologist, [employer] Strain: Low Risk  (12/06/2022)    Overall Financial Resource Strain (CARDIA)     Difficulty of Paying Living Expenses: Not hard at all   Food Insecurity: No Food Insecurity (12/04/2022)    Hunger Vital Sign     Worried About Running Out of Food in the Last Year: Never true     Ran Out of Food in the Last Year: Never true   Transportation Needs: No Transportation Needs (12/06/2022)    PRAPARE - Therapist, Art (Medical): No     Lack of Transportation (Non-Medical): No   Intimate Partner Violence: Not At Risk (12/04/2022)    Humiliation, Afraid, Rape, and Kick questionnaire     Fear of Current or Ex-Partner: No     Emotionally Abused: No     Physically Abused: No     Sexually Abused: No   Housing Stability: Low Risk  (12/06/2022)    Housing Stability Vital Sign     Unable to Pay for Housing in the Last Year: No     Number of Times Moved in the Last Year: 0     Homeless in the Last Year: No   [3]   Family History  Problem Relation Name Age of Onset    Heart failure Mother      Myocardial Infarction Father      Breast cancer Sister          Garon Eden, MD  06/21/23 [phone]

## 2023-06-22 LAB — LAB USE ONLY - URINE GRAY CULTURE HOLD TUBE

## 2023-07-11 ENCOUNTER — Other Ambulatory Visit: Payer: Self-pay | Admitting: General Acute Care Hospital

## 2023-07-11 DIAGNOSIS — Z1231 Encounter for screening mammogram for malignant neoplasm of breast: Secondary | ICD-10-CM

## 2023-07-15 ENCOUNTER — Emergency Department

## 2023-07-15 ENCOUNTER — Encounter: Payer: Self-pay | Admitting: Internal Medicine

## 2023-07-15 ENCOUNTER — Inpatient Hospital Stay
Admission: EM | Admit: 2023-07-15 | Discharge: 2023-07-19 | DRG: 871 | Disposition: A | Discharge status: Home / Self Care | Attending: Internal Medicine | Admitting: Internal Medicine

## 2023-07-15 DIAGNOSIS — Z79899 Other long term (current) drug therapy: Secondary | ICD-10-CM

## 2023-07-15 DIAGNOSIS — N179 Acute kidney failure, unspecified: Secondary | ICD-10-CM | POA: Diagnosis present

## 2023-07-15 DIAGNOSIS — E860 Dehydration: Secondary | ICD-10-CM | POA: Diagnosis present

## 2023-07-15 DIAGNOSIS — Z87891 Personal history of nicotine dependence: Secondary | ICD-10-CM

## 2023-07-15 DIAGNOSIS — I639 Cerebral infarction, unspecified: Secondary | ICD-10-CM

## 2023-07-15 DIAGNOSIS — Z90710 Acquired absence of both cervix and uterus: Secondary | ICD-10-CM

## 2023-07-15 DIAGNOSIS — D539 Nutritional anemia, unspecified: Secondary | ICD-10-CM | POA: Diagnosis present

## 2023-07-15 DIAGNOSIS — I160 Hypertensive urgency: Secondary | ICD-10-CM | POA: Diagnosis present

## 2023-07-15 DIAGNOSIS — E871 Hypo-osmolality and hyponatremia: Secondary | ICD-10-CM | POA: Diagnosis present

## 2023-07-15 DIAGNOSIS — R051 Acute cough: Secondary | ICD-10-CM

## 2023-07-15 DIAGNOSIS — N3000 Acute cystitis without hematuria: Principal | ICD-10-CM | POA: Diagnosis present

## 2023-07-15 DIAGNOSIS — I2089 Other forms of angina pectoris: Secondary | ICD-10-CM | POA: Diagnosis present

## 2023-07-15 DIAGNOSIS — Z7982 Long term (current) use of aspirin: Secondary | ICD-10-CM

## 2023-07-15 DIAGNOSIS — G928 Other toxic encephalopathy: Secondary | ICD-10-CM | POA: Diagnosis present

## 2023-07-15 DIAGNOSIS — A419 Sepsis, unspecified organism: Principal | ICD-10-CM | POA: Diagnosis present

## 2023-07-15 DIAGNOSIS — R062 Wheezing: Secondary | ICD-10-CM

## 2023-07-15 DIAGNOSIS — R6520 Severe sepsis without septic shock: Secondary | ICD-10-CM | POA: Diagnosis present

## 2023-07-15 DIAGNOSIS — E119 Type 2 diabetes mellitus without complications: Secondary | ICD-10-CM | POA: Diagnosis present

## 2023-07-15 DIAGNOSIS — I10 Essential (primary) hypertension: Secondary | ICD-10-CM | POA: Diagnosis present

## 2023-07-15 DIAGNOSIS — Z794 Long term (current) use of insulin: Secondary | ICD-10-CM

## 2023-07-15 DIAGNOSIS — R4182 Altered mental status, unspecified: Secondary | ICD-10-CM | POA: Diagnosis present

## 2023-07-15 DIAGNOSIS — Z9049 Acquired absence of other specified parts of digestive tract: Secondary | ICD-10-CM

## 2023-07-15 DIAGNOSIS — Z951 Presence of aortocoronary bypass graft: Secondary | ICD-10-CM

## 2023-07-15 DIAGNOSIS — E785 Hyperlipidemia, unspecified: Secondary | ICD-10-CM | POA: Diagnosis present

## 2023-07-15 LAB — LAB USE ONLY - CBC WITH DIFFERENTIAL
Absolute Basophils: 0.02 10*3/uL (ref 0.00–0.08)
Absolute Eosinophils: 0.01 10*3/uL (ref 0.00–0.44)
Absolute Immature Granulocytes: 0.05 10*3/uL (ref 0.00–0.07)
Absolute Lymphocytes: 0.99 10*3/uL (ref 0.42–3.22)
Absolute Monocytes: 1.06 10*3/uL — ABNORMAL HIGH (ref 0.21–0.85)
Absolute Neutrophils: 8.29 10*3/uL — ABNORMAL HIGH (ref 1.10–6.33)
Absolute nRBC: 0 10*3/uL (ref <=0.00)
Basophils %: 0.2 %
Eosinophils %: 0.1 %
Hematocrit: 28.3 % — ABNORMAL LOW (ref 34.7–43.7)
Hemoglobin: 9.5 g/dL — ABNORMAL LOW (ref 11.4–14.8)
Immature Granulocytes %: 0.5 %
Lymphocytes %: 9.5 %
MCH: 33.2 pg (ref 25.1–33.5)
MCHC: 33.6 g/dL (ref 31.5–35.8)
MCV: 99 fL — ABNORMAL HIGH (ref 78.0–96.0)
MPV: 9.8 fL (ref 8.9–12.5)
Monocytes %: 10.2 %
Neutrophils %: 79.5 %
Platelet Count: 170 10*3/uL (ref 142–346)
Preliminary Absolute Neutrophil Count: 8.29 10*3/uL — ABNORMAL HIGH (ref 1.10–6.33)
RBC: 2.86 10*6/uL — ABNORMAL LOW (ref 3.90–5.10)
RDW: 13 % (ref 11–15)
WBC: 10.42 10*3/uL — ABNORMAL HIGH (ref 3.10–9.50)
nRBC %: 0 /100{WBCs} (ref <=0.0)

## 2023-07-15 LAB — COMPREHENSIVE METABOLIC PANEL
ALT: 14 U/L (ref <=55)
AST (SGOT): 24 U/L (ref <=41)
Albumin/Globulin Ratio: 0.7 — ABNORMAL LOW (ref 0.9–2.2)
Albumin: 2.8 g/dL — ABNORMAL LOW (ref 3.5–5.0)
Alkaline Phosphatase: 70 U/L (ref 37–117)
Anion Gap: 7 (ref 5.0–15.0)
BUN: 23 mg/dL — ABNORMAL HIGH (ref 7–21)
Bilirubin, Total: 0.4 mg/dL (ref 0.2–1.2)
CO2: 23 meq/L (ref 17–29)
Calcium: 9.6 mg/dL (ref 7.9–10.2)
Chloride: 100 meq/L (ref 99–111)
Creatinine: 1.6 mg/dL — ABNORMAL HIGH (ref 0.4–1.0)
GFR: 31 mL/min/{1.73_m2} — ABNORMAL LOW (ref >=60.0)
Globulin: 3.9 g/dL — ABNORMAL HIGH (ref 2.0–3.6)
Glucose: 195 mg/dL — ABNORMAL HIGH (ref 70–100)
Potassium: 4.5 meq/L (ref 3.5–5.3)
Protein, Total: 6.7 g/dL (ref 6.0–8.3)
Sodium: 130 meq/L — ABNORMAL LOW (ref 135–145)

## 2023-07-15 LAB — ECG 12-LEAD
Atrial Rate: 70 {beats}/min
P Axis: 37 degrees
P-R Interval: 218 ms
Q-T Interval: 392 ms
QRS Duration: 92 ms
QTC Calculation (Bezet): 423 ms
R Axis: 5 degrees
T Axis: 56 degrees
Ventricular Rate: 70 {beats}/min

## 2023-07-15 LAB — COVID-19 (SARS-COV-2) & INFLUENZA  A/B, NAA (ROCHE LIAT)
Influenza A RNA: NOT DETECTED
Influenza B RNA: NOT DETECTED
SARS-CoV-2 (COVID-19) RNA: NOT DETECTED

## 2023-07-15 LAB — PT/INR
INR: 1.2 (ref 0.9–1.1)
PT: 13.8 s — ABNORMAL HIGH (ref 10.1–12.9)

## 2023-07-15 LAB — URINALYSIS WITH REFLEX TO MICROSCOPIC EXAM - REFLEX TO CULTURE
Urine Bilirubin: NEGATIVE
Urine Blood: NEGATIVE
Urine Glucose: NEGATIVE
Urine Ketones: NEGATIVE mg/dL
Urine Nitrite: NEGATIVE
Urine Specific Gravity: 1.016 (ref 1.001–1.035)
Urine Urobilinogen: NORMAL mg/dL (ref 0.2–2.0)
Urine pH: 6.5 (ref 5.0–8.0)

## 2023-07-15 LAB — NT-PROBNP: NT-ProBNP: 3331 pg/mL — ABNORMAL HIGH (ref <450)

## 2023-07-15 LAB — HIGH SENSITIVITY TROPONIN-I WITH DELTA
hs Troponin-I Delta: 5
hs Troponin: 13.9 ng/L (ref <=14.0)

## 2023-07-15 LAB — WHOLE BLOOD GLUCOSE POCT
Whole Blood Glucose POCT: 172 mg/dL — ABNORMAL HIGH (ref 70–100)
Whole Blood Glucose POCT: 228 mg/dL — ABNORMAL HIGH (ref 70–100)
Whole Blood Glucose POCT: 193 mg/dL — ABNORMAL HIGH (ref 70–100)

## 2023-07-15 LAB — LACTIC ACID: Whole Blood Lactic Acid: 1.1 mmol/L (ref 0.2–2.0)

## 2023-07-15 LAB — HIGH SENSITIVITY TROPONIN-I: hs Troponin: 18.6 ng/L — ABNORMAL HIGH (ref <=14.0)

## 2023-07-15 LAB — LIPASE: Lipase: 8 U/L (ref 8–78)

## 2023-07-15 LAB — APTT: PTT: 29 s (ref 27–39)

## 2023-07-15 MED ORDER — GLUCOSE 40 % PO GEL (WRAP)
15.0000 g | ORAL | Status: DC | PRN
Start: 2023-07-15 — End: 2023-07-15

## 2023-07-15 MED ORDER — SODIUM CHLORIDE 0.9 % IV MBP
1.0000 g | Freq: Every day | INTRAVENOUS | Status: AC
Start: 2023-07-16 — End: ?
  Administered 2023-07-16 – 2023-07-19 (×4): 1 g via INTRAVENOUS
  Filled 2023-07-15 (×4): qty 1000

## 2023-07-15 MED ORDER — IPRATROPIUM BROMIDE 0.02 % IN SOLN
0.5000 mg | Freq: Once | RESPIRATORY_TRACT | Status: AC
Start: 2023-07-15 — End: 2023-07-15
  Administered 2023-07-15: 0.5 mg via RESPIRATORY_TRACT
  Filled 2023-07-15: qty 2.5

## 2023-07-15 MED ORDER — ASPIRIN 81 MG PO TBEC
81.0000 mg | DELAYED_RELEASE_TABLET | Freq: Every day | ORAL | Status: AC
Start: 2023-07-15 — End: ?
  Administered 2023-07-15 – 2023-07-19 (×5): 81 mg via ORAL
  Filled 2023-07-15 (×5): qty 1

## 2023-07-15 MED ORDER — MELATONIN 3 MG PO TABS
3.0000 mg | ORAL_TABLET | Freq: Every evening | ORAL | Status: AC | PRN
Start: 2023-07-15 — End: ?
  Administered 2023-07-16 – 2023-07-18 (×3): 3 mg via ORAL
  Filled 2023-07-15 (×3): qty 1

## 2023-07-15 MED ORDER — DEXTROSE 10 % IV BOLUS
12.5000 g | INTRAVENOUS | Status: AC | PRN
Start: 2023-07-15 — End: ?

## 2023-07-15 MED ORDER — DEXTROSE 10 % IV BOLUS
12.5000 g | INTRAVENOUS | Status: DC | PRN
Start: 2023-07-15 — End: 2023-07-15

## 2023-07-15 MED ORDER — CARBOXYMETHYLCELLULOSE SOD PF 0.5 % OP SOLN
1.0000 [drp] | Freq: Three times a day (TID) | OPHTHALMIC | Status: AC | PRN
Start: 2023-07-15 — End: ?

## 2023-07-15 MED ORDER — BENZONATATE 100 MG PO CAPS
100.0000 mg | ORAL_CAPSULE | Freq: Three times a day (TID) | ORAL | Status: AC | PRN
Start: 2023-07-15 — End: ?
  Administered 2023-07-17 – 2023-07-18 (×3): 100 mg via ORAL
  Filled 2023-07-15 (×3): qty 1

## 2023-07-15 MED ORDER — DEXTROSE 50 % IV SOLN
12.5000 g | INTRAVENOUS | Status: DC | PRN
Start: 2023-07-15 — End: 2023-07-15

## 2023-07-15 MED ORDER — INSULIN LISPRO 100 UNIT/ML SOLN (WRAP)
1.0000 [IU] | Freq: Every evening | Status: AC
Start: 2023-07-15 — End: ?
  Administered 2023-07-15 – 2023-07-16 (×2): 1 [IU] via SUBCUTANEOUS
  Filled 2023-07-15 (×2): qty 3

## 2023-07-15 MED ORDER — INSULIN LISPRO 100 UNIT/ML SOLN (WRAP)
1.0000 [IU] | Freq: Three times a day (TID) | Status: DC
Start: 2023-07-15 — End: 2023-07-15

## 2023-07-15 MED ORDER — ONDANSETRON HCL 4 MG/2ML IJ SOLN
4.0000 mg | Freq: Four times a day (QID) | INTRAMUSCULAR | Status: AC | PRN
Start: 2023-07-15 — End: ?

## 2023-07-15 MED ORDER — DEXTROSE 50 % IV SOLN
12.5000 g | INTRAVENOUS | Status: AC | PRN
Start: 2023-07-15 — End: ?

## 2023-07-15 MED ORDER — ALBUTEROL SULFATE (2.5 MG/3ML) 0.083% IN NEBU
2.5000 mg | INHALATION_SOLUTION | Freq: Four times a day (QID) | RESPIRATORY_TRACT | Status: AC | PRN
Start: 2023-07-15 — End: ?

## 2023-07-15 MED ORDER — PANTOPRAZOLE SODIUM 40 MG PO TBEC
40.0000 mg | DELAYED_RELEASE_TABLET | Freq: Every day | ORAL | Status: AC
Start: 2023-07-16 — End: ?
  Administered 2023-07-16 – 2023-07-19 (×4): 40 mg via ORAL
  Filled 2023-07-15 (×5): qty 1

## 2023-07-15 MED ORDER — ONDANSETRON 4 MG PO TBDP
4.0000 mg | ORAL_TABLET | Freq: Four times a day (QID) | ORAL | Status: AC | PRN
Start: 2023-07-15 — End: ?

## 2023-07-15 MED ORDER — VITAMIN D (ERGOCALCIFEROL) 1.25 MG (50000 UT) PO CAPS
50000.0000 [IU] | ORAL_CAPSULE | ORAL | Status: AC
Start: 2023-07-16 — End: ?
  Administered 2023-07-16: 50000 [IU] via ORAL
  Filled 2023-07-15 (×4): qty 1

## 2023-07-15 MED ORDER — VALSARTAN 40 MG PO TABS
40.0000 mg | ORAL_TABLET | Freq: Every day | ORAL | Status: DC
Start: 2023-07-15 — End: 2023-07-15

## 2023-07-15 MED ORDER — INSULIN LISPRO 100 UNIT/ML SOLN (WRAP)
1.0000 [IU] | Freq: Three times a day (TID) | Status: AC
Start: 2023-07-16 — End: ?
  Administered 2023-07-17: 2 [IU] via SUBCUTANEOUS
  Administered 2023-07-18: 1 [IU] via SUBCUTANEOUS
  Filled 2023-07-15: qty 3
  Filled 2023-07-15: qty 6

## 2023-07-15 MED ORDER — ATORVASTATIN CALCIUM 10 MG PO TABS
10.0000 mg | ORAL_TABLET | Freq: Every day | ORAL | Status: AC
Start: 2023-07-15 — End: ?
  Administered 2023-07-15 – 2023-07-19 (×5): 10 mg via ORAL
  Filled 2023-07-15 (×5): qty 1

## 2023-07-15 MED ORDER — AMLODIPINE BESYLATE 5 MG PO TABS
5.0000 mg | ORAL_TABLET | Freq: Every day | ORAL | Status: AC
Start: 2023-07-15 — End: ?
  Administered 2023-07-15 – 2023-07-18 (×4): 5 mg via ORAL
  Filled 2023-07-15 (×4): qty 1

## 2023-07-15 MED ORDER — SALINE SPRAY 0.65 % NA SOLN
2.0000 | NASAL | Status: AC | PRN
Start: 2023-07-15 — End: ?

## 2023-07-15 MED ORDER — GLUCAGON 1 MG IJ SOLR (WRAP)
1.0000 mg | INTRAMUSCULAR | Status: AC | PRN
Start: 2023-07-15 — End: ?

## 2023-07-15 MED ORDER — POLYETHYLENE GLYCOL 3350 17 G PO PACK
17.0000 g | PACK | Freq: Every day | ORAL | Status: AC | PRN
Start: 2023-07-15 — End: ?

## 2023-07-15 MED ORDER — LIDOCAINE 5 % EX PTCH
1.0000 | MEDICATED_PATCH | CUTANEOUS | Status: AC
Start: 2023-07-15 — End: ?
  Filled 2023-07-15 (×4): qty 1

## 2023-07-15 MED ORDER — STERILE WATER FOR INJECTION IJ/IV SOLN (WRAP)
2.0000 g | Freq: Once | INTRAMUSCULAR | Status: AC
Start: 2023-07-15 — End: 2023-07-15
  Administered 2023-07-15: 2 g via INTRAVENOUS
  Filled 2023-07-15: qty 2000

## 2023-07-15 MED ORDER — RANOLAZINE ER 500 MG PO TB12
500.0000 mg | ORAL_TABLET | Freq: Two times a day (BID) | ORAL | Status: AC
Start: 2023-07-15 — End: ?
  Administered 2023-07-15 – 2023-07-19 (×8): 500 mg via ORAL
  Filled 2023-07-15 (×9): qty 1

## 2023-07-15 MED ORDER — HYDRALAZINE HCL 20 MG/ML IJ SOLN
10.0000 mg | Freq: Once | INTRAMUSCULAR | Status: AC
Start: 2023-07-15 — End: 2023-07-15
  Administered 2023-07-15: 10 mg via INTRAVENOUS
  Filled 2023-07-15: qty 1

## 2023-07-15 MED ORDER — INSULIN LISPRO 100 UNIT/ML SOLN (WRAP)
1.0000 [IU] | Freq: Every evening | Status: DC
Start: 2023-07-15 — End: 2023-07-15

## 2023-07-15 MED ORDER — GLUCAGON 1 MG IJ SOLR (WRAP)
1.0000 mg | INTRAMUSCULAR | Status: DC | PRN
Start: 2023-07-15 — End: 2023-07-15

## 2023-07-15 MED ORDER — HEPARIN SODIUM (PORCINE) 5000 UNIT/ML IJ SOLN
5000.0000 [IU] | Freq: Three times a day (TID) | INTRAMUSCULAR | Status: AC
Start: 2023-07-15 — End: ?
  Administered 2023-07-15 – 2023-07-19 (×11): 5000 [IU] via SUBCUTANEOUS
  Filled 2023-07-15 (×12): qty 1

## 2023-07-15 MED ORDER — SODIUM CHLORIDE 0.9 % IV BOLUS
30.0000 mL/kg | Freq: Once | INTRAVENOUS | Status: AC
Start: 2023-07-15 — End: ?

## 2023-07-15 MED ORDER — FLUTICASONE PROPIONATE 50 MCG/ACT NA SUSP
2.0000 | Freq: Every evening | NASAL | Status: AC
Start: 2023-07-15 — End: ?
  Administered 2023-07-15: 2 via NASAL
  Filled 2023-07-15: qty 16

## 2023-07-15 MED ORDER — HYDRALAZINE HCL 20 MG/ML IJ SOLN
10.0000 mg | Freq: Four times a day (QID) | INTRAMUSCULAR | Status: AC | PRN
Start: 2023-07-15 — End: ?

## 2023-07-15 MED ORDER — NALOXONE HCL 0.4 MG/ML IJ SOLN (WRAP)
0.2000 mg | INTRAMUSCULAR | Status: AC | PRN
Start: 2023-07-15 — End: ?

## 2023-07-15 MED ORDER — CALCIUM CARBONATE ANTACID 500 MG PO CHEW
1.0000 | CHEWABLE_TABLET | Freq: Four times a day (QID) | ORAL | Status: AC | PRN
Start: 2023-07-15 — End: ?
  Filled 2023-07-15 (×4): qty 1

## 2023-07-15 MED ORDER — SENNOSIDES-DOCUSATE SODIUM 8.6-50 MG PO TABS
2.0000 | ORAL_TABLET | Freq: Every evening | ORAL | Status: AC
Start: 2023-07-15 — End: ?
  Administered 2023-07-15 – 2023-07-18 (×4): 2 via ORAL
  Filled 2023-07-15 (×4): qty 2

## 2023-07-15 MED ORDER — ALBUTEROL SULFATE (2.5 MG/3ML) 0.083% IN NEBU
7.5000 mg | INHALATION_SOLUTION | Freq: Once | RESPIRATORY_TRACT | Status: AC
Start: 2023-07-15 — End: 2023-07-15
  Administered 2023-07-15: 7.5 mg via RESPIRATORY_TRACT
  Filled 2023-07-15: qty 9

## 2023-07-15 MED ORDER — SUCRALFATE 1 G PO TABS
1.0000 g | ORAL_TABLET | Freq: Four times a day (QID) | ORAL | Status: AC | PRN
Start: 2023-07-15 — End: ?

## 2023-07-15 MED ORDER — GLUCOSE 40 % PO GEL (WRAP)
15.0000 g | ORAL | Status: AC | PRN
Start: 2023-07-15 — End: ?

## 2023-07-15 MED ORDER — BENZOCAINE-MENTHOL MT LOZG (WRAP)
1.0000 | LOZENGE | OROMUCOSAL | Status: AC | PRN
Start: 2023-07-15 — End: ?

## 2023-07-15 MED ORDER — ACETAMINOPHEN 325 MG PO TABS
650.0000 mg | ORAL_TABLET | ORAL | Status: AC | PRN
Start: 2023-07-15 — End: ?
  Administered 2023-07-15 – 2023-07-16 (×3): 650 mg via ORAL
  Filled 2023-07-15 (×3): qty 2

## 2023-07-15 MED ORDER — VENLAFAXINE HCL ER 75 MG PO CP24
75.0000 mg | ORAL_CAPSULE | Freq: Every evening | ORAL | Status: AC
Start: 2023-07-15 — End: ?
  Administered 2023-07-15 – 2023-07-18 (×4): 75 mg via ORAL
  Filled 2023-07-15 (×4): qty 1

## 2023-07-15 NOTE — ED Notes (Signed)
 Report received from elsa,rn after lunch break

## 2023-07-15 NOTE — ED Notes (Signed)
 Report given to elsa,rn for lunch break

## 2023-07-15 NOTE — ED Notes (Signed)
Bed: E10  Expected date:   Expected time:   Means of arrival: [id number] [place]  Comments:  [facility] 409

## 2023-07-15 NOTE — ED Provider Notes (Addendum)
 EMERGENCY DEPARTMENT NOTE     Patient initially seen and examined at   ED PHYSICIAN ASSIGNED       Date/Time Event User Comments    07/15/23 1248 Physician Assigned Maicol Bowland A. Arthor Bernardino LABOR, MD assigned as Attending             HISTORY OF PRESENT ILLNESS       Chief Complaint: Slurred Speech, Extremity Weakness, and Altered Mental Status       History of Present Illness  The patient, an 83 year old with a history of diabetes, hypertension, GERD, and coronary artery bypass, who presents with generalized fatigue, somnolence sleeping  throughout the day and more confused than usual according to the daughter who video chats with her daily from [place] . These symptoms occurred over 24-48 hours ago. The patient's daughter, who checks in daily via video call, noticed a decline in the patient's condition, including shaking and being out of sorts.  She has a dry cough, which is new and severe enough to cause headaches. She also reports abdominal tenderness in the bilateral upper quadrants. The patient has a history of a gastrointestinal virus, which had a significant impact on her health for several weeks. She had recovered from this and was able to socialize and move around before the recent decline in her health.        Independent Historian (other than patient): No  Additional History Provided by Independent Historian:  MEDICAL HISTORY     Past Medical History:  Past Medical History:   Diagnosis Date    Diabetes mellitus (CMS/HCC)     Gastroesophageal reflux disease     Hyperlipidemia     Hypertension     Hypotension     Seasonal allergic rhinitis     Syncope        Past Surgical History:  Past Surgical History[1]    Social History:  @SOC @    Family History:  Family History[2]    Outpatient Medication:  Previous Medications    ACETAMINOPHEN  (TYLENOL ) 325 MG TABLET    Take 2 tablets (650 mg) by mouth every 6 (six) hours as needed for Pain (or headache)    ALBUTEROL  SULFATE HFA (PROVENTIL ) 108 (90 BASE) MCG/ACT  INHALER        ASPIRIN  EC 81 MG EC TABLET    Take 1 tablet (81 mg) by mouth daily    CALCIUM  CARBONATE (TUMS) 500 MG CHEWABLE TABLET    Chew 1 tablet (500 mg) by mouth every 6 (six) hours as needed for Heartburn    CANDESARTAN  (ATACAND ) 4 MG TABLET    Take 1 tablet (4 mg) by mouth daily as needed (SBP > 160)    ERGOCALCIFEROL  (ERGOCALCIFEROL ) 1.25 MG (50000 UT) CAPSULE    Take 1 capsule (50,000 Units) by mouth once a week On Saturday    FEXOFENADINE (ALLEGRA) 180 MG TABLET    Take 1 tablet (180 mg) by mouth daily    FLUTICASONE  (FLONASE  SENSIMIST) 27.5 MCG/SPRAY NASAL SPRAY    1 spray by Nasal route daily    LIDOCAINE  (LIDODERM ) 5 %    Place 1 patch onto the skin every 24 hours Remove & Discard patch within 12 hours or as directed by MD    LIDOCAINE  (LIDODERM ) 5 %    Place 1 patch onto the skin every 24 hours Remove & Discard patch within 12 hours or as directed by MD    LOVASTATIN (MEVACOR) 40 MG TABLET    Take 1 tablet (40 mg)  by mouth nightly    MECLIZINE  (ANTIVERT ) 12.5 MG TABLET    Take 2 tablets (25 mg) by mouth 3 (three) times daily as needed for Dizziness    MIDODRINE  (PROAMATINE ) 5 MG TABLET    Take 1 tablet (5 mg) by mouth 3 (three) times daily As needed if SBP < 100 mm of HG    MULTIPLE VITAMINS-MINERALS (CENTRUM SILVER 50+WOMEN) TAB    Take 1 tablet by mouth daily    ONDANSETRON  (ZOFRAN -ODT) 4 MG DISINTEGRATING TABLET    Take 1 tablet (4 mg) by mouth every 6 (six) hours as needed for Nausea    ONDANSETRON  (ZOFRAN -ODT) 4 MG DISINTEGRATING TABLET    Take 1 tablet (4 mg) by mouth every 6 (six) hours as needed for Nausea    PANTOPRAZOLE  (PROTONIX ) 40 MG TABLET    Take 1 tablet (40 mg) by mouth nightly    POLYETHYLENE GLYCOL (MIRALAX ) 17 G PACKET    Take 17 g by mouth daily    RANOLAZINE  (RANEXA ) 500 MG 12 HR TABLET    Take 1 tablet (500 mg) by mouth 2 (two) times daily    SUCRALFATE  (CARAFATE ) 1 G TABLET    Take 1 tablet (1 g) by mouth every 6 (six) hours as needed (abdominal pain)    TRAMADOL  (ULTRAM ) 50 MG  TABLET    Take 1 tablet (50 mg) by mouth every 8 (eight) hours as needed for Pain    VENLAFAXINE  (EFFEXOR -XR) 37.5 MG 24 HR CAPSULE    Take 2 capsules (75 mg) by mouth nightly         REVIEW OF SYSTEMS   Review of Systems See History of Present Illness  PHYSICAL EXAM     ED Triage Vitals [07/15/23 1242]   Encounter Vitals Group      BP 141/65      Systolic BP Percentile       Diastolic BP Percentile       Heart Rate 67      Resp Rate 20      Temp 98.7 F (37.1 C)      Temp src Oral      SpO2 97 %      Weight 85.7 kg      Height 1.753 m      Head Circumference       Peak Flow       Pain Score 0      Pain Loc       Pain Education       Exclude from Growth Chart      Physical Exam   Physical Exam  GENERAL: Patient resting comfortably, alert and cooperative.  CHEST: Wheezing bilaterally, no rhonchi or rales.  CARDIOVASCULAR: Regular rate, no murmurs.  ABDOMEN: Tenderness in bilateral upper quadrants, no tenderness in lower quadrants, positive guarding, no rebound or rigidity.  EXTREMITIES: No pitting edema of lower extremities.  NEUROLOGICAL: Motor strength intact, no pronator drift.  SKIN: Skin nondiaphoretic, dry and warm.        MEDICAL DECISION MAKING     PRIMARY PROBLEM LIST      Acute illness/injury DIAGNOSIS: Wheezing, acute delirium.  Chronic Illness Impacting Care of the above problem: Hypertension and Advanced age Increases complexity of evaluation  Differential Diagnosis: Infection, metabolic derangement, electrolyte derangement, anemia, dehydration, arrhythmia, malignancy    DISCUSSION      Assessment & Plan  Altered mental status secondary to urinary tract infection  Altered mental status due to UTI, confirmed by urinalysis showing  acute cystitis. CT head negative for intracranial events. Admission necessary for observation due to living situation.  - Admit for observation.  - Administer IV antibiotics.    Urinary tract infection (UTI)  UTI confirmed by urinalysis, primary cause of altered mental status  and somnolence.  - Administer IV antibiotics.    Wheezing and cough  New bilateral wheezing and dry cough with headaches. No asthma or smoking history. Chest x-ray negative for pneumonia. Etiology unclear, testing for viral causes planned.  - Administer nebulizer treatment.  - Test for COVID and flu.    Abdominal pain  Bilateral upper quadrant tenderness with guarding. Imaging required to determine cause.  - CT of the abdomen and pelvis is nonacute.   - Patient is s/p cholecystectomy.   - No elevated bili levels or transaminitis.           ED Course as of 07/15/23 1651   Sat Jul 15, 2023   1646 Discussed with Dr Niki who has agreed to accept this patient for admission.  [RM]      ED Course User Index  [RM] Arthor Bernardino LABOR, MD     NIH Stroke Score      Flowsheet Row Most Recent Value   NIH Stroke Scale    Interval Baseline admission to ED   1a. Level of Consciousness 0   1b. LOC Questions (age, month) 0   1c. LOC Commands (Open and close eyes, Grip AND release good hand) 0   2. Best Gaze 0   3. Visual Fields 0   4. Facial Palsy 0   5a. Motor Left Arm: (Arms with palm down X 10 seconds. Sitting = arms at 90 degrees. Supine = arms 45 degrees) 0   5b. Motor Right Arm: (Arms with palm down X 10 seconds. Sitting = arms at 90 degrees Supine = arms 45 degrees) 0   Total Motor Arms 0   6a. Motor Left Leg: (Leg elevated X 5 seconds Supine = Leg 30 degrees) 0   6b. Motor Right Leg: (Leg elevated X 5 seconds Supine = Leg 30 degrees) 0   Total Motor Legs 0   7. Limb Ataxia (Finger to nose, heel to shin) 0   8. Sensory (Sensation or grimace to pin prick on face, arm, trunk, and leg) 0   9. Best language (Describe picture, name items, read sentences from NIHSS booklet) 0   10. Dysarthria (Read list from NIHSS booklet) 1   11. Extinction and Inattention (formerly Neglect) - Test tactile and visual stimulation 0   NIHSS Total 1                        External Records Reviewed?: Physician Office Records and Inpatient  Records    Additional Notes                  Vital Signs: Reviewed the patient's vital signs.   Nursing Notes: Reviewed and utilized available nursing notes.  Medical Records Reviewed: Reviewed available past medical records.  Counseling: The emergency provider has spoken with the patient and discussed today 's findings, in addition to providing specific details for the plan of care.  Questions are answered and there is agreement with the plan.        CARDIAC STUDIES    The following cardiac studies were independently interpreted by the Emergency Medicine Physician.  For full cardiac study results please see chart.    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 PR interval 218. Normal Axis. Normal intervals. No Ectopy.  No ST elevations or depressions. No T wave inversions. No STEMI. Q waves in inferior leads  Impression: Normal sinus arrhythmia with 1st degree AV block. Q waves in inferior leads also seen in prior EKGs      MIPS DOCUMENTATION                RADIOLOGY IMAGING STUDIES      CT Abd/Pelvis without Contrast   Final Result      1. No acute abnormal findings are identified. No explanation for acute   symptoms.   2. Cholecystectomy.   3. Moderate hiatal hernia.   4. Hysterectomy.   5. Foreign body in the vaginal lumen of unclear significance.   6. Diverticulosis of the colon.      Deward Holt, MD   07/15/2023 3:44 PM      XR Chest  AP Portable   Final Result    No acute disease.      Toribio Hering, MD   07/15/2023 2:45 PM      CT Head WO Contrast   Final Result       1.No acute intracranial abnormality.   2.Mild chronic small vessel ischemic changes.   3.Air-fluid level in right maxillary sinus and large air-fluid level at the   mastoid tip air cells on the right. Correlation with symptoms of acute on   chronic sinusitis and mastoiditis is recommended.      Nelya Ebadirad   07/15/2023 1:52 PM           EMERGENCY DEPT. MEDICATIONS      ED Medication Orders (From admission, onward)      Start Ordered     Status Ordering Provider    07/15/23 1648 07/15/23 1647  hydrALAZINE  (APRESOLINE ) injection 10 mg  Once        Route: Intravenous  Ordered Dose: 10 mg       Ordered Imanol Bihl A    07/15/23 1632 07/15/23 1631  cefTRIAXone  (ROCEPHIN ) 2 g in sterile water  (preservative free) 20 mL IV push injection  Once        Route: Intravenous  Ordered Dose: 2 g       Acknowledged Jaziyah Gradel A    07/15/23 1351 07/15/23 1350  albuterol  (PROVENTIL ) (2.5 MG/3ML) 0.083% nebulizer solution 7.5 mg  RT - Once        Route: Nebulization  Ordered Dose: 7.5 mg       Last MAR action: Given Lorene Klimas A    07/15/23 1351 07/15/23 1350  ipratropium (ATROVENT ) 0.02 % nebulizer solution 0.5 mg  RT - Once        Route: Nebulization  Ordered Dose: 0.5 mg       Last MAR action: Given Aribelle Mccosh A            LABORATORY RESULTS    Ordered and independently interpreted AVAILABLE laboratory tests.   Results       Procedure Component Value Units Date/Time    High Sensitivity Troponin-I with Delta [[id number]] Collected: 07/15/23 1617    Specimen: Blood, Venous Updated: 07/15/23 1621    Urinalysis with Reflex to Microscopic Exam and Culture [[id number]]  (Abnormal) Collected: 07/15/23 1509    Specimen: Urine, Clean Catch Updated: 07/15/23 1533     Urine Color Yellow     Urine Clarity Hazy  Urine Specific Gravity 1.016     Urine pH 6.5     Urine Leukocyte Esterase Moderate     Urine Nitrite Negative     Urine Protein 30= 1+     Urine Glucose Negative     Urine Ketones Negative mg/dL      Urine Urobilinogen Normal mg/dL      Urine Bilirubin Negative     Urine Blood Negative     RBC, UA 3-5 /hpf      Urine WBC 26-50 /hpf      Urine Squamous Epithelial Cells 0-5 /hpf     Culture, Urine [[id number]] Collected: 07/15/23 1509    Specimen: Urine, Clean Catch Updated: 07/15/23 1533    Urine Gray Culture Hold Tube [[id number]] Collected: 07/15/23  1509    Specimen: Urine, Clean Catch Updated: 07/15/23 1514    High Sensitivity Troponin-I [[id number]]  (Abnormal) Collected: 07/15/23 1304    Specimen: Blood, Venous Updated: 07/15/23 1343     hs Troponin 18.6 ng/L     Comprehensive Metabolic Panel [[id number]]  (Abnormal) Collected: 07/15/23 1304    Specimen: Blood, Venous Updated: 07/15/23 1337     Glucose 195 mg/dL      BUN 23 mg/dL      Creatinine 1.6 mg/dL      Sodium 869 mEq/L      Potassium 4.5 mEq/L      Chloride 100 mEq/L      CO2 23 mEq/L      Calcium  9.6 mg/dL      Anion Gap 7.0     GFR 31.0 mL/min/1.73 m2      AST (SGOT) 24 U/L      ALT 14 U/L      Alkaline Phosphatase 70 U/L      Albumin 2.8 g/dL      Protein, Total 6.7 g/dL      Globulin 3.9 g/dL      Albumin/Globulin Ratio 0.7     Bilirubin, Total 0.4 mg/dL     PT/INR [[id number]]  (Abnormal) Collected: 07/15/23 1304    Specimen: Blood, Venous Updated: 07/15/23 1325     PT 13.8 sec      INR 1.2    APTT [[id number]]  (Normal) Collected: 07/15/23 1304    Specimen: Blood, Venous Updated: 07/15/23 1325     PTT 29 sec     CBC with Differential (Order) [[id number]]  (Abnormal) Collected: 07/15/23 1304    Specimen: Blood, Venous Updated: 07/15/23 1314    Narrative:      The following orders were created for panel order CBC with Differential (Order).  Procedure                               Abnormality         Status                     ---------                               -----------         ------                     CBC with Differential (...[[id number]]  Abnormal            Final result  Please view results for these tests on the individual orders.    CBC with Differential (Component) [[id number]]  (Abnormal) Collected: 07/15/23 1304    Specimen: Blood, Venous Updated: 07/15/23 1314     WBC 10.42 x10 3/uL      Hemoglobin 9.5 g/dL      Hematocrit 71.6 %      Platelet Count 170 x10 3/uL      MPV 9.8 fL      RBC 2.86 x10 6/uL      MCV 99.0 fL      MCH 33.2 pg      MCHC 33.6 g/dL      RDW  13 %      nRBC % 0.0 /100 WBC      Absolute nRBC 0.00 x10 3/uL      Preliminary Absolute Neutrophil Count 8.29 x10 3/uL      Neutrophils % 79.5 %      Lymphocytes % 9.5 %      Monocytes % 10.2 %      Eosinophils % 0.1 %      Basophils % 0.2 %      Immature Granulocytes % 0.5 %      Absolute Neutrophils 8.29 x10 3/uL      Absolute Lymphocytes 0.99 x10 3/uL      Absolute Monocytes 1.06 x10 3/uL      Absolute Eosinophils 0.01 x10 3/uL      Absolute Basophils 0.02 x10 3/uL      Absolute Immature Granulocytes 0.05 x10 3/uL     Whole Blood Glucose POCT [[id number]]  (Abnormal) Collected: 07/15/23 1302    Specimen: Blood, Capillary Updated: 07/15/23 1307     Whole Blood Glucose POCT 172 mg/dL               CRITICAL CARE/PROCEDURES    Procedures    DIAGNOSIS      Diagnosis:  Final diagnoses:   Altered mental status   Acute cystitis without hematuria   Wheezing   Acute cough       Disposition:  ED Disposition       ED Disposition   Admit    Condition   --    Date/Time   Sat Jul 15, 2023  4:48 PM    Comment   Admitting Physician: NIKI RUEBEN RAMAN [[id number]]   Service:: Medicine [106]   Estimated Length of Stay: > or = to 2 midnights   Tentative Discharge Plan?: Home or Self Care [1]   Does patient need telemetry?: No                 Prescriptions:  Patient's Medications   New Prescriptions    No medications on file   Previous Medications    ACETAMINOPHEN  (TYLENOL ) 325 MG TABLET    Take 2 tablets (650 mg) by mouth every 6 (six) hours as needed for Pain (or headache)    ALBUTEROL  SULFATE HFA (PROVENTIL ) 108 (90 BASE) MCG/ACT INHALER        ASPIRIN  EC 81 MG EC TABLET    Take 1 tablet (81 mg) by mouth daily    CALCIUM  CARBONATE (TUMS) 500 MG CHEWABLE TABLET    Chew 1 tablet (500 mg) by mouth every 6 (six) hours as needed for Heartburn    CANDESARTAN  (ATACAND ) 4 MG TABLET    Take 1 tablet (4 mg) by mouth daily as needed (SBP > 160)    ERGOCALCIFEROL  (ERGOCALCIFEROL ) 1.25 MG (50000 UT) CAPSULE    Take  1 capsule (50,000 Units) by  mouth once a week On Saturday    FEXOFENADINE (ALLEGRA) 180 MG TABLET    Take 1 tablet (180 mg) by mouth daily    FLUTICASONE  (FLONASE  SENSIMIST) 27.5 MCG/SPRAY NASAL SPRAY    1 spray by Nasal route daily    LIDOCAINE  (LIDODERM ) 5 %    Place 1 patch onto the skin every 24 hours Remove & Discard patch within 12 hours or as directed by MD    LIDOCAINE  (LIDODERM ) 5 %    Place 1 patch onto the skin every 24 hours Remove & Discard patch within 12 hours or as directed by MD    LOVASTATIN (MEVACOR) 40 MG TABLET    Take 1 tablet (40 mg) by mouth nightly    MECLIZINE  (ANTIVERT ) 12.5 MG TABLET    Take 2 tablets (25 mg) by mouth 3 (three) times daily as needed for Dizziness    MIDODRINE  (PROAMATINE ) 5 MG TABLET    Take 1 tablet (5 mg) by mouth 3 (three) times daily As needed if SBP < 100 mm of HG    MULTIPLE VITAMINS-MINERALS (CENTRUM SILVER 50+WOMEN) TAB    Take 1 tablet by mouth daily    ONDANSETRON  (ZOFRAN -ODT) 4 MG DISINTEGRATING TABLET    Take 1 tablet (4 mg) by mouth every 6 (six) hours as needed for Nausea    ONDANSETRON  (ZOFRAN -ODT) 4 MG DISINTEGRATING TABLET    Take 1 tablet (4 mg) by mouth every 6 (six) hours as needed for Nausea    PANTOPRAZOLE  (PROTONIX ) 40 MG TABLET    Take 1 tablet (40 mg) by mouth nightly    POLYETHYLENE GLYCOL (MIRALAX ) 17 G PACKET    Take 17 g by mouth daily    RANOLAZINE  (RANEXA ) 500 MG 12 HR TABLET    Take 1 tablet (500 mg) by mouth 2 (two) times daily    SUCRALFATE  (CARAFATE ) 1 G TABLET    Take 1 tablet (1 g) by mouth every 6 (six) hours as needed (abdominal pain)    TRAMADOL  (ULTRAM ) 50 MG TABLET    Take 1 tablet (50 mg) by mouth every 8 (eight) hours as needed for Pain    VENLAFAXINE  (EFFEXOR -XR) 37.5 MG 24 HR CAPSULE    Take 2 capsules (75 mg) by mouth nightly   Modified Medications    No medications on file   Discontinued Medications    No medications on file           This note was generated by the Epic EMR system/ Dragon speech recognition and may contain inherent errors or omissions  not intended by the user. Grammatical errors, random word insertions, deletions and pronoun errors  are occasional consequences of this technology due to software limitations. Not all errors are caught or corrected. If there are questions or concerns about the content of this note or information contained within the body of this dictation they should be addressed directly with the author for clarification.       Arthor Bernardino LABOR, MD  07/15/23 1651         [1]   Past Surgical History:  Procedure Laterality Date    BREAST BIOPSY Right     2014    CORONARY ARTERY BYPASS      HYSTERECTOMY      partial-1970's   [2]   Family History  Problem Relation Name Age of Onset    Heart failure Mother      Myocardial Infarction Father  Breast cancer Sister          Arthor Bernardino LABOR, MD  07/27/23 [phone]

## 2023-07-15 NOTE — Progress Notes (Signed)
 Received patient from transport. Daughter Bernice with patient. A&O only to self. Lung sound expiratory wheezing. Patient is running a fever 100.2 On coming nurse Jamee is aware. Denies any pain. Skin is intact just scattered scars. Daughter reports at baseline patient is A&O*4. Able to feed self. Getting assist with dressing and bathing. Able to ambulate with a walker at baseline.

## 2023-07-15 NOTE — ED to IP RN Note (Signed)
 [facility] DEPARTMENT  ED NURSING NOTE FOR THE RECEIVING INPATIENT NURSE   ED NURSE [place] RN, [place] RN   [facility] [phone]   ED CHARGE RN [place]   ADMISSION INFORMATION   CYLEE DATTILO is a 83 y.o. female admitted with an ED diagnosis of:    1. Acute cystitis without hematuria    2. Altered mental status    3. Wheezing    4. Acute cough         Isolation: Covid test pending   Allergies: Fentanyl and Percolone [oxycodone]   Holding Orders confirmed? No   Belongings Documented? Yes   Home medications sent to pharmacy confirmed? N/A   NURSING CARE   Patient Comes From:   Mental Status: Assisted Living  alert, oriented, and lethargic   ADL: Dependent with ADLs   Ambulation: Unable to ambulate currently, baseline with walker   Pertinent Information  and Safety Concerns:     [place] Violence Risk Level: Low Next lactic @1850      CT / NIH   CT Head ordered on this patient?  Yes   NIH/Dysphagia assessment done prior to admission? Yes   VITAL SIGNS (at the time of this note)      Vitals:    07/15/23 1651   BP: (!) 228/90   Pulse: 72   Resp: (!) 27   Temp:    SpO2: 98%     Pain Score: 0-No pain (07/15/23 1242)

## 2023-07-15 NOTE — H&P (Addendum)
 ADMISSION HISTORY AND PHYSICAL EXAM    [facility], DIVISION OF HOSPITALIST MEDICINE   [facility]   [place] Pager: [phone]      Date Time: 07/15/23 5:37 PM  Patient Name: Vanessa Kelly  Attending Physician: Niki Rueben RAMAN, MD  Primary Care Physician: Aneita Dorothyann BRAVO, NP    CC: AMS   History Gathered From: Self and Adult Child    Assessment:     Active Hospital Problems    Diagnosis    Altered mental status       Vanessa Kelly is a 83 y.o. female with a PMHx of GERD, diabetes, hypertension, hyperlipidemia who presented to the ED with altered mental status and was found to have sepsis secondary to UTI.    Plan:   -Admit to [facility] Hospitalist service  Acute toxic metabolic encephalopathy secondary to Sepsis, acute encephalopathy improving, patient slightly confused but alert and oriented during bedside encounter  Sepsis secondary to UTI   Acute cystitis   Leukocytosis  Patient presented with altered mental status, urinary retention for the past 3 days, UA positive for leukocyte esterase and WBCs seen on microscopy  Patient given IV ceftriaxone  in the ED  Will give 30 mL/kg fluid as per sepsis protocol  Continue ceftriaxone   Follow urine culture and blood culture  Monitor CBC count  Duonebs prn     Elevated troponin  Echo done in August showed EF 65 to 70%, no history of heart failure  Likely secondary to demand  Telemetry monitoring  Hypertensive urgency  Hypertension  History of orthostatic hypotension  Blood pressure elevated 220 systolic by 90s diastolic  Hold valsartan  in view of AKI, consider resuming home medication once renal function stabilizes  Start amlodipine  5 mg daily  Hydralazine  as needed for systolic blood pressure greater than 180  Macrocytic anemia, chronic, PoA  Monitor hemoglobin  AKI likely secondary to urinary retention  Urinary retention resolved  Presented with creatinine of 1.6, baseline creatinine appears to be 1.3-1.4  IV fluids  Monitor renal  function  Avoid nephrotoxic agents  Hold valsartan   Monitor with bladder scan and insert Foley catheter if patient is retaining urine  Vaginal foreign body seen on CT abdomen pelvis was a foley catheter, removed at bedside by ED provider       Hyponatremia   Sodium 130, symptomatic  Monitor sodium levels  Diabetes  Sliding scale insulin   Monitor fingerstick glucose            Patient has a BMI of 27.9 kg/m2    Diagnosis: Overweight: BMI of 25 to 29.9       Recent Labs     07/15/23  1304   Sodium 130*     Diagnosis: Mild Hyponatremia     Recent Labs   Lab 07/15/23  1304   Hemoglobin 9.5*   Hematocrit 28.3*   MCV 99.0*   WBC 10.42*   Platelet Count 170         Anemia Diagnosis: Chronic: Chronic Anemia, Unspecified  Cr Baseline Estimation (minimum in last 3 months): 1.3 mg/dL  Maximum Cr in last 36 hours: 1.6 mg/dL    Recent Labs (Last 3 Months)     07/15/23  1304 06/21/23  0944 06/07/23  1002   Creatinine 1.6* 1.3* 1.4*   BUN 23* 34* 22*       Acute Kidney Injury Diagnosis: Acute Kidney Injury, present on admission         Nutrition:Adult diet Regular  VTE Prophylaxis-Medication VTE Prophylaxis Orders: heparin  (porcine) injection 5,000 Units  Mechanical VTE Prophylaxis Orders: Maintain sequential compression device    Code status: Full code     Status/Disposition:   Pt is admitted under INPATIENT with above concerns.    Anticipated medical stability for discharge: I expect an inpatient to remain in the hospital for more than 2 midnights due to acute encephalopathy secondary to sepsis      History of Presenting Illnes   Vanessa Kelly is a 83 y.o. female presenting with altered mental status from assisted living facility.  As per the daughter patient has been declining over the past few days with increasing tiredness and confusion.  During bedside encounter patient alert, oriented x 3 but slightly confused.  Patient reports she had abdominal pain and kept on feeling more tired over the past few days.  Patient  denies chest pain, chills, fevers, rigors, nausea, vomiting, headache.  Patient reported she has not been able to void for the past 3 days  In the ED patient will be elevated at 10.4, creatinine 1.6, lactic acid 1.1, initial troponin 18.6 repeat 13.9.  UA positive for leukocyte esterase and 26-50 WBCs on microscopy.  Patient was given ceftriaxone     Past Medical History     Past Medical History:   Diagnosis Date    Diabetes mellitus (CMS/HCC)     Gastroesophageal reflux disease     Hyperlipidemia     Hypertension     Hypotension     Seasonal allergic rhinitis     Syncope          Available old records reviewed, including: EPIC     Past Surgical History:   Past Surgical History[1]    Family History:   Family History[2]    Social History:    reports that she quit smoking about 35 years ago. Her smoking use included cigarettes. She has never used smokeless tobacco. She reports current alcohol use. She reports that she does not use drugs.    Allergies:   Allergies[3]    Medications:     Home Medications       Med List Status: In Progress Set By: Yogeswaran, Vyshnave, RN at 07/15/2023  5:36 PM              acetaminophen  (TYLENOL ) 325 MG tablet     Take 2 tablets (650 mg) by mouth every 6 (six) hours as needed for Pain (or headache)     albuterol  sulfate HFA (PROVENTIL ) 108 (90 Base) MCG/ACT inhaler          aspirin  EC 81 MG EC tablet     Take 1 tablet (81 mg) by mouth daily     calcium  carbonate (TUMS) 500 MG chewable tablet     Chew 1 tablet (500 mg) by mouth every 6 (six) hours as needed for Heartburn     candesartan  (ATACAND ) 4 MG tablet     Take 1 tablet (4 mg) by mouth daily as needed (SBP > 160)     ergocalciferol  (ERGOCALCIFEROL ) 1.25 MG (50000 UT) capsule     Take 1 capsule (50,000 Units) by mouth once a week On Saturday     fexofenadine (ALLEGRA) 180 MG tablet     Take 1 tablet (180 mg) by mouth daily     fluticasone  (Flonase  Sensimist) 27.5 MCG/SPRAY nasal spray     1 spray by Nasal route daily     lidocaine   (LIDODERM ) 5 %     Place  1 patch onto the skin every 24 hours Remove & Discard patch within 12 hours or as directed by MD     lidocaine  (LIDODERM ) 5 %     Place 1 patch onto the skin every 24 hours Remove & Discard patch within 12 hours or as directed by MD     lovastatin (MEVACOR) 40 MG tablet     Take 1 tablet (40 mg) by mouth nightly     meclizine  (ANTIVERT ) 12.5 MG tablet     Take 2 tablets (25 mg) by mouth 3 (three) times daily as needed for Dizziness     midodrine  (PROAMATINE ) 5 MG tablet     Take 1 tablet (5 mg) by mouth 3 (three) times daily As needed if SBP < 100 mm of HG     Multiple Vitamins-Minerals (Centrum Silver 50+Women) Tab     Take 1 tablet by mouth daily     ondansetron  (ZOFRAN -ODT) 4 MG disintegrating tablet     Take 1 tablet (4 mg) by mouth every 6 (six) hours as needed for Nausea     ondansetron  (ZOFRAN -ODT) 4 MG disintegrating tablet     Take 1 tablet (4 mg) by mouth every 6 (six) hours as needed for Nausea     pantoprazole  (PROTONIX ) 40 MG tablet     Take 1 tablet (40 mg) by mouth nightly     polyethylene glycol (MIRALAX ) 17 g packet     Take 17 g by mouth daily     Patient taking differently: Take 17 g by mouth 2 (two) times daily     ranolazine  (RANEXA ) 500 MG 12 hr tablet     Take 1 tablet (500 mg) by mouth 2 (two) times daily     sucralfate  (CARAFATE ) 1 g tablet     Take 1 tablet (1 g) by mouth every 6 (six) hours as needed (abdominal pain)     traMADol  (ULTRAM ) 50 MG tablet     Take 1 tablet (50 mg) by mouth every 8 (eight) hours as needed for Pain     venlafaxine  (EFFEXOR -XR) 37.5 MG 24 hr capsule     Take 2 capsules (75 mg) by mouth nightly            Method by which medications were confirmed on admission: EMR       Review of Systems:   All other systems were reviewed and are negative except:as above in HPI     Physical Exam:   Patient Vitals for the past 24 hrs:   BP Temp Temp src Pulse Resp SpO2 Height Weight   07/15/23 1700 150/64 -- -- 72 -- 99 % -- --   07/15/23 1651 (!) 228/90  -- -- 72 (!) 27 98 % -- --   07/15/23 1604 (!) 207/81 -- -- 71 (!) 24 -- -- --   07/15/23 1501 158/65 -- -- 74 (!) 24 98 % -- --   07/15/23 1431 154/65 -- -- 67 -- 98 % -- --   07/15/23 1401 170/71 -- -- 67 (!) 23 99 % -- --   07/15/23 1301 136/60 -- -- 68 -- 98 % -- --   07/15/23 1246 141/65 -- -- 67 -- 97 % -- --   07/15/23 1242 141/65 98.7 F (37.1 C) Oral 67 20 97 % 1.753 m (5' 9) 85.7 kg (188 lb 15 oz)     Body mass index is 27.9 kg/m.    Intake/Output Summary (Last 24 hours) at 07/15/2023 1737  Last data filed at 07/15/2023 1510  Gross per 24 hour   Intake --   Output 425 ml   Net -425 ml       General: WD female in no acute distress.  HEENT: perrla, eomi, sclera anicteric  oropharynx clear without lesions, mucous membranes moist  Neck: supple  Cardiovascular: Normal S1 and S2, no murmurs, rubs or gallops  Lungs: clear to auscultation bilaterally, without wheezing, rhonchi, or rales  Abdomen: soft, non-tender, non-distended; normoactive bowel sounds, no rebound or guarding  Extremities: no edema , pulses palpable   Neuro: alert, oriented x 3, cranial nerves grossly intact, strength 5/5 in upper and lower extremities, sensation intact,   Skin: no rashes or lesions noted  Other:       Labs:     Results       Procedure Component Value Units Date/Time    COVID-19 and Influenza ([place]) (symptomatic) [[id number]] Collected: 07/15/23 1710    Specimen: Swab from Anterior Nares Updated: 07/15/23 1729    NT-ProBNP [[id number]] Collected: 07/15/23 1718    Specimen: Blood, Venous Updated: 07/15/23 1729    Lipase [[id number]] Collected: 07/15/23 1304    Specimen: Blood, Venous Updated: 07/15/23 1728    High Sensitivity Troponin-I with Delta [[id number]] Collected: 07/15/23 1617    Specimen: Blood, Venous Updated: 07/15/23 1701     hs Troponin 13.9 ng/L      hs Troponin-I Delta 5    Lactic Acid [[id number]]  (Normal) Collected: 07/15/23 1647    Specimen: Blood, Venous Updated: 07/15/23 1656     Whole Blood Lactic Acid 1.1  mmol/L     Urinalysis with Reflex to Microscopic Exam and Culture [[id number]]  (Abnormal) Collected: 07/15/23 1509    Specimen: Urine, Clean Catch Updated: 07/15/23 1533     Urine Color Yellow     Urine Clarity Hazy     Urine Specific Gravity 1.016     Urine pH 6.5     Urine Leukocyte Esterase Moderate     Urine Nitrite Negative     Urine Protein 30= 1+     Urine Glucose Negative     Urine Ketones Negative mg/dL      Urine Urobilinogen Normal mg/dL      Urine Bilirubin Negative     Urine Blood Negative     RBC, UA 3-5 /hpf      Urine WBC 26-50 /hpf      Urine Squamous Epithelial Cells 0-5 /hpf     Culture, Urine [[id number]] Collected: 07/15/23 1509    Specimen: Urine, Clean Catch Updated: 07/15/23 1533    Urine Gray Culture Hold Tube [[id number]] Collected: 07/15/23 1509    Specimen: Urine, Clean Catch Updated: 07/15/23 1514    High Sensitivity Troponin-I [[id number]]  (Abnormal) Collected: 07/15/23 1304    Specimen: Blood, Venous Updated: 07/15/23 1343     hs Troponin 18.6 ng/L     Comprehensive Metabolic Panel [[id number]]  (Abnormal) Collected: 07/15/23 1304    Specimen: Blood, Venous Updated: 07/15/23 1337     Glucose 195 mg/dL      BUN 23 mg/dL      Creatinine 1.6 mg/dL      Sodium 869 mEq/L      Potassium 4.5 mEq/L      Chloride 100 mEq/L      CO2 23 mEq/L      Calcium  9.6 mg/dL      Anion Gap 7.0     GFR 31.0 mL/min/1.73 m2      AST (  SGOT) 24 U/L      ALT 14 U/L      Alkaline Phosphatase 70 U/L      Albumin 2.8 g/dL      Protein, Total 6.7 g/dL      Globulin 3.9 g/dL      Albumin/Globulin Ratio 0.7     Bilirubin, Total 0.4 mg/dL     PT/INR [[id number]]  (Abnormal) Collected: 07/15/23 1304    Specimen: Blood, Venous Updated: 07/15/23 1325     PT 13.8 sec      INR 1.2    APTT [[id number]]  (Normal) Collected: 07/15/23 1304    Specimen: Blood, Venous Updated: 07/15/23 1325     PTT 29 sec     CBC with Differential (Order) [[id number]]  (Abnormal) Collected: 07/15/23 1304    Specimen: Blood, Venous Updated:  07/15/23 1314    Narrative:      The following orders were created for panel order CBC with Differential (Order).  Procedure                               Abnormality         Status                     ---------                               -----------         ------                     CBC with Differential (...[[id number]]  Abnormal            Final result                 Please view results for these tests on the individual orders.    CBC with Differential (Component) [[id number]]  (Abnormal) Collected: 07/15/23 1304    Specimen: Blood, Venous Updated: 07/15/23 1314     WBC 10.42 x10 3/uL      Hemoglobin 9.5 g/dL      Hematocrit 71.6 %      Platelet Count 170 x10 3/uL      MPV 9.8 fL      RBC 2.86 x10 6/uL      MCV 99.0 fL      MCH 33.2 pg      MCHC 33.6 g/dL      RDW 13 %      nRBC % 0.0 /100 WBC      Absolute nRBC 0.00 x10 3/uL      Preliminary Absolute Neutrophil Count 8.29 x10 3/uL      Neutrophils % 79.5 %      Lymphocytes % 9.5 %      Monocytes % 10.2 %      Eosinophils % 0.1 %      Basophils % 0.2 %      Immature Granulocytes % 0.5 %      Absolute Neutrophils 8.29 x10 3/uL      Absolute Lymphocytes 0.99 x10 3/uL      Absolute Monocytes 1.06 x10 3/uL      Absolute Eosinophils 0.01 x10 3/uL      Absolute Basophils 0.02 x10 3/uL      Absolute Immature Granulocytes 0.05 x10 3/uL     Whole Blood Glucose POCT [[id number]]  (Abnormal) Collected: 07/15/23  1302    Specimen: Blood, Capillary Updated: 07/15/23 1307     Whole Blood Glucose POCT 172 mg/dL              EKG: SR    Imaging personally reviewed, including: all available   CT Abd/Pelvis without Contrast    Result Date: 07/15/2023  1. No acute abnormal findings are identified. No explanation for acute symptoms. 2. Cholecystectomy. 3. Moderate hiatal hernia. 4. Hysterectomy. 5. Foreign body in the vaginal lumen of unclear significance. 6. Diverticulosis of the colon. Deward Holt, MD 07/15/2023 3:44 PM    XR Chest  AP Portable    Result Date: 07/15/2023   No  acute disease. Toribio Hering, MD 07/15/2023 2:45 PM    CT Head WO Contrast    Result Date: 07/15/2023   1.No acute intracranial abnormality. 2.Mild chronic small vessel ischemic changes. 3.Air-fluid level in right maxillary sinus and large air-fluid level at the mastoid tip air cells on the right. Correlation with symptoms of acute on chronic sinusitis and mastoiditis is recommended. Nelya Ebadirad 07/15/2023 1:52 PM         This note was generated by the Epic EMR system/ Dragon speech recognition and may contain inherent errors or omissions not intended by the user. Grammatical errors, random word insertions, deletions and pronoun errors  are occasional consequences of this technology due to software limitations. Not all errors are caught or corrected. If there are questions or concerns about the content of this note or information contained within the body of this dictation they should be addressed directly with the author for clarification.    Signed by: Rueben GORMAN Bidding, MD, MD   rr:[id number], Dorothyann BRAVO, NP          [1]   Past Surgical History:  Procedure Laterality Date    BREAST BIOPSY Right     2014    CORONARY ARTERY BYPASS      HYSTERECTOMY      partial-1970's   [2]   Family History  Problem Relation Name Age of Onset    Heart failure Mother      Myocardial Infarction Father      Breast cancer Sister     [3]   Allergies  Allergen Reactions    Fentanyl Anaphylaxis     Hives    Percolone [Oxycodone]

## 2023-07-15 NOTE — ED Notes (Signed)
 Patient transferred to inpatient floor.  Bing Neighbors MD responded to secure chat to HOLD IVF NS order.

## 2023-07-15 NOTE — Plan of Care (Signed)
 Notified by RN pt's daughter stating pt is diabetic, not on SSI and wants diet changes.   Confirmed pt is diabetic.   Ordered LDSSI QAC HS  Changed diet to cons carb      Philippa Chester, NP

## 2023-07-15 NOTE — Progress Notes (Signed)
 4 eyes in 4 hours pressure injury assessment note:      Completed with: Evan   Unit & Time admitted: 6B &19:00             Bony Prominences: Check appropriate box; if wound is present enter wound assessment in LDA     Occiput:                 [x] WNL  []  Wound present  Face:                     [x] WNL  []  Wound present  Ears:                      [x] WNL  []  Wound present  Spine:                    [x] WNL  []  Wound present  Shoulders:             [x] WNL  []  Wound present  Elbows:                  [x] WNL  []  Wound present  Sacrum/coccyx:     [x] WNL  []  Wound present  Ischial Tuberosity:  [x] WNL  []  Wound present  Trochanter/Hip:      [x] WNL  []  Wound present  Knees:                   [x] WNL  []  Wound present  Ankles:                   [x] WNL  []  Wound present  Heels:                    [x] WNL  []  Wound present  Other pressure areas:  []  Wound location       Device related: []  Device name:         LDA completed if wound present: yes/no  Consult WOCN if necessary    Other skin related issues, ie tears, rash, etc, document in Integumentary flowsheet

## 2023-07-16 DIAGNOSIS — R42 Dizziness and giddiness: Secondary | ICD-10-CM

## 2023-07-16 LAB — COMPREHENSIVE METABOLIC PANEL
ALT: 15 U/L (ref <=55)
AST (SGOT): 28 U/L (ref <=41)
Albumin/Globulin Ratio: 0.7 — ABNORMAL LOW (ref 0.9–2.2)
Albumin: 2.7 g/dL — ABNORMAL LOW (ref 3.5–5.0)
Alkaline Phosphatase: 65 U/L (ref 37–117)
Anion Gap: 10 (ref 5.0–15.0)
BUN: 27 mg/dL — ABNORMAL HIGH (ref 7–21)
Bilirubin, Total: 0.3 mg/dL (ref 0.2–1.2)
CO2: 20 meq/L (ref 17–29)
Calcium: 9.8 mg/dL (ref 7.9–10.2)
Chloride: 101 meq/L (ref 99–111)
Creatinine: 1.5 mg/dL — ABNORMAL HIGH (ref 0.4–1.0)
GFR: 33.5 mL/min/{1.73_m2} — ABNORMAL LOW (ref >=60.0)
Globulin: 3.9 g/dL — ABNORMAL HIGH (ref 2.0–3.6)
Glucose: 113 mg/dL — ABNORMAL HIGH (ref 70–100)
Potassium: 4.7 meq/L (ref 3.5–5.3)
Protein, Total: 6.6 g/dL (ref 6.0–8.3)
Sodium: 131 meq/L — ABNORMAL LOW (ref 135–145)

## 2023-07-16 LAB — CBC WITH MANUAL DIFFERENTIAL
Absolute Bands: 0 10*3/uL (ref 0.00–1.00)
Absolute Basophils: 0 10*3/uL (ref 0.00–0.08)
Absolute Eosinophils: 0 10*3/uL (ref 0.00–0.44)
Absolute Lymphocytes: 1.14 10*3/uL (ref 0.42–3.22)
Absolute Monocytes: 0.79 10*3/uL (ref 0.21–0.85)
Absolute Neutrophils: 6.86 10*3/uL — ABNORMAL HIGH (ref 1.10–6.33)
Bands %: 0 %
Basophils %: 0 %
Eosinophils %: 0 %
Hematocrit: 31.7 % — ABNORMAL LOW (ref 34.7–43.7)
Hemoglobin: 10.4 g/dL — ABNORMAL LOW (ref 11.4–14.8)
Lymphocytes %: 13 %
MCH: 33.7 pg — ABNORMAL HIGH (ref 25.1–33.5)
MCHC: 32.8 g/dL (ref 31.5–35.8)
MCV: 102.6 fL — ABNORMAL HIGH (ref 78.0–96.0)
MPV: 10.3 fL (ref 8.9–12.5)
Monocytes %: 9 %
Neutrophils %: 78 %
Platelet Count: 149 10*3/uL (ref 142–346)
Platelet Estimate: DECREASED — AB
RBC Morphology: NORMAL
RBC: 3.09 10*6/uL — ABNORMAL LOW (ref 3.90–5.10)
RDW: 13 % (ref 11–15)
WBC: 8.8 10*3/uL (ref 3.10–9.50)
nRBC %: 0 /100{WBCs} (ref <=0.0)

## 2023-07-16 LAB — MAGNESIUM: Magnesium: 1.7 mg/dL (ref 1.6–2.6)

## 2023-07-16 LAB — POTASSIUM: Potassium: 4.8 meq/L (ref 3.5–5.3)

## 2023-07-16 LAB — LAB USE ONLY - URINE GRAY CULTURE HOLD TUBE

## 2023-07-16 LAB — WHOLE BLOOD GLUCOSE POCT
Whole Blood Glucose POCT: 127 mg/dL — ABNORMAL HIGH (ref 70–100)
Whole Blood Glucose POCT: 156 mg/dL — ABNORMAL HIGH (ref 70–100)
Whole Blood Glucose POCT: 176 mg/dL — ABNORMAL HIGH (ref 70–100)
Whole Blood Glucose POCT: 243 mg/dL — ABNORMAL HIGH (ref 70–100)

## 2023-07-16 MED ORDER — ALBUTEROL-IPRATROPIUM 2.5-0.5 (3) MG/3ML IN SOLN
3.0000 mL | Freq: Four times a day (QID) | RESPIRATORY_TRACT | Status: AC
Start: 2023-07-16 — End: ?
  Administered 2023-07-16 – 2023-07-17 (×4): 3 mL via RESPIRATORY_TRACT
  Filled 2023-07-16 (×4): qty 3

## 2023-07-16 MED ORDER — GUAIFENESIN-DM 100-10 MG/5ML PO SYRP
5.0000 mL | ORAL_SOLUTION | ORAL | Status: AC | PRN
Start: 2023-07-16 — End: ?
  Administered 2023-07-16 – 2023-07-18 (×4): 5 mL via ORAL
  Filled 2023-07-16 (×4): qty 10

## 2023-07-16 NOTE — Plan of Care (Signed)
 Problem: Fluid and Electrolyte Imbalance/ Endocrine  Goal: Fluid and electrolyte balance are achieved/maintained  Outcome: Progressing  Flowsheets (Taken 07/16/2023 2300)  Fluid and electrolyte balance are achieved/maintained:   Assess and reassess fluid and electrolyte status   Monitor for muscle weakness   Monitor/assess lab values and report abnormal values   Observe for cardiac arrhythmias     Problem: Diabetes: Glucose Imbalance  Goal: Blood glucose stable at established goal  Outcome: Progressing  Flowsheets (Taken 07/16/2023 2300)  Blood glucose stable at established goal:   Monitor lab values   Monitor intake and output.  Notify LIP if urine output is < 30 mL/hour.   Include patient/family in decisions related to nutrition/dietary selections   Monitor/assess vital signs   Assess for hypoglycemia /hyperglycemia   Ensure adequate hydration   Ensure appropriate diet and assess tolerance   Coordinate medication administration with meals, as indicated     Problem: Neurological Deficit  Goal: Neurological status is stable or improving  Outcome: Progressing  Flowsheets (Taken 07/16/2023 2300)  Neurological status is stable or improving: Perform CAM Assessment     Problem: Pain interferes with ability to perform ADL  Goal: Pain at adequate level as identified by patient  Outcome: Progressing  Flowsheets (Taken 07/16/2023 2300)  Pain at adequate level as identified by patient:   Identify patient comfort function goal   Assess pain on admission, during daily assessment and/or before any as needed intervention(s)   Reassess pain within 30-60 minutes of any procedure/intervention, per Pain Assessment, Intervention, Reassessment (AIR) Cycle   Evaluate if patient comfort function goal is met   Evaluate patient's satisfaction with pain management progress   Offer non-pharmacological pain management interventions     Problem: Safety  Goal: Patient will be free from injury during hospitalization  Outcome:  Progressing  Flowsheets (Taken 07/16/2023 2300)  Patient will be free from injury during hospitalization:   Hourly rounding   Assess patient's risk for falls and implement fall prevention plan of care per policy   Use appropriate transfer methods   Ensure appropriate safety devices are available at the bedside   Provide and maintain safe environment

## 2023-07-16 NOTE — Progress Notes (Signed)
 MEDICINE PROGRESS NOTE  [facility], DIVISION OF HOSPITALIST MEDICINE         Date Time: 07/16/23 12:23 PM  Patient Name: Vanessa Kelly  Attending Physician: Kennie Deatrice DEL, DO  Hospital Day: 2    Active Hospital Problems    Diagnosis    Altered mental status                 Subjective/ROS/24 hr events:   Patient still reports feeling confused, foggy, cough, left-sided abdominal pain    Assessment and Plan :     This is a 83 y.o. female with PMH of GERD, DM, HTN, HLD, chronic stable angina, admitted with altered mental status likely secondary to UTI    #Altered mental status  #Acute toxic metabolic encephalopathy secondary to infection/UTI  #Abdominal pain, right side  CT brain negative except for air-fluid level in right maxillary sinus and large air-fluid level at mastoid tip, correlate with symptoms of sinusitis/mastoiditis, no other symptoms reported by patient aside from confusion, improving with antibiotics  UA positive  CT abdomen pelvis without acute abnormality  Waxing and waning confusion  WBC decreasing  Continue ceftriaxone , follow-up urine culture    #Cough  #Wheeze  Chest x-ray negative  WBC improving  On room air  Continue DuoNebs, Robitussin, continue to monitor    #chronic stable angina  #Hypertensive urgency  #History of hypertension  #History of orthostatic hypotension  #Elevated troponin  Blood pressure initially 228/90, slow reduction over 24 hours, improving with 5 mg amlodipine  daily, hydralazine  as needed  Continue ranexa   Valsartan  held given AKI  Troponin trended down from 18-13    #AKI likely due to urinary retention  Urinary retention resolved, continue IV fluids, vaginal foreign body seen on CT abdomen pelvis was a Foley catheter, removed at bedside by ED provider    #Hyponatremia, likely due to dehydration, improving with hydration, continue to monitor    #History of diabetes, continue sliding scale insulin                   Lines:     Patient Lines/Drains/Airways  Status       Active PICC Line / CVC Line / PIV Line / Drain / Airway / Intraosseous Line / Epidural Line / ART Line / Line / Wound / Pressure Ulcer / NG/OG Tube       Name Placement date Placement time Site Days    Peripheral IV 07/15/23 20 G Standard Anterior;Distal;Left Upper Arm 07/15/23  1303  Upper Arm  less than 1    Peripheral IV 07/15/23 20 G Standard Right Antecubital 07/15/23  1615  Antecubital  less than 1                         Review of Systems:   Review of Systems - Negative except as above in HPI    Physical Exam:     Temp:  [97.9 F (36.6 C)-101.7 F (38.7 C)] 99 F (37.2 C)  Heart Rate:  [66-83] 71  Resp Rate:  [18-27] 18  BP: (125-228)/(60-90) 159/64  Intake and Output Summary-last 24 Hrs:  I/O last 3 completed shifts:  In: 300 [P.O.:300]  Out: 725 [Urine:725]  No data recorded by O2 Device: None (Room air)       General: in no acute distress,  Cardiovascular: regular rate and rhythm  Lungs: Mild bilateral expiratory wheeze  Abdomen: Right-sided tenderness  Extremities: no edema  Neurological: Alert and  oriented          Meds:   Medications were reviewed:  Scheduled Meds:  Current Facility-Administered Medications   Medication Dose Route Frequency    albuterol -ipratropium  3 mL Nebulization Q6H SCH    amLODIPine   5 mg Oral Daily    aspirin  EC  81 mg Oral Daily    atorvastatin   10 mg Oral Daily    cefTRIAXone   1 g Intravenous Daily at 1700    fluticasone   2 spray Each Nare QHS    heparin  (porcine)  5,000 Units Subcutaneous Q8H SCH    insulin  lispro  1-3 Units Subcutaneous QHS    insulin  lispro  1-5 Units Subcutaneous TID AC    lidocaine   1 patch Transdermal Q24H    pantoprazole   40 mg Oral Daily at 0600    ranolazine   500 mg Oral Q12H SCH    senna-docusate  2 tablet Oral QHS    sodium chloride   30 mL/kg Intravenous Once    venlafaxine   75 mg Oral QHS    vitamin D  (ergocalciferol )  50,000 Units Oral Weekly     Continuous Infusions:  PRN Meds:.acetaminophen , albuterol , benzocaine -menthol ,  benzonatate , calcium  carbonate, carboxymethylcellulose sodium, dextrose  **OR** dextrose  **OR** dextrose  **OR** glucagon  (rDNA), guaiFENesin -dextromethorphan , hydrALAZINE , melatonin, naloxone , ondansetron  **OR** ondansetron , polyethylene glycol, saline, sucralfate     Labs/Radiology:   Imaging personally reviewed, including: all available   CT Abd/Pelvis without Contrast    Result Date: 07/15/2023  1. No acute abnormal findings are identified. No explanation for acute symptoms. 2. Cholecystectomy. 3. Moderate hiatal hernia. 4. Hysterectomy. 5. Foreign body in the vaginal lumen of unclear significance. 6. Diverticulosis of the colon. Deward Holt, MD 07/15/2023 3:44 PM    XR Chest  AP Portable    Result Date: 07/15/2023   No acute disease. Toribio Hering, MD 07/15/2023 2:45 PM    CT Head WO Contrast    Result Date: 07/15/2023   1.No acute intracranial abnormality. 2.Mild chronic small vessel ischemic changes. 3.Air-fluid level in right maxillary sinus and large air-fluid level at the mastoid tip air cells on the right. Correlation with symptoms of acute on chronic sinusitis and mastoiditis is recommended. Nelya Ebadirad 07/15/2023 1:52 PM   Recent Labs     07/16/23  1105 07/16/23  0744 07/15/23  2054 07/15/23  1915 07/15/23  1302   Whole Blood Glucose POCT 156* 127* 193* 228* 172*     Recent Labs   Lab 07/16/23  0256 07/15/23  1304   Sodium 131* 130*   Potassium 4.7  4.8 4.5   Chloride 101 100   BUN 27* 23*   Creatinine 1.5* 1.6*   GFR 33.5* 31.0*   Glucose 113* 195*   Calcium  9.8 9.6     Recent Labs   Lab 07/16/23  0256 07/15/23  1304   WBC 8.80 10.42*   Hemoglobin 10.4* 9.5*   Hematocrit 31.7* 28.3*   Platelet Count 149 170     Recent Labs   Lab 07/15/23  1304   PT 13.8*   INR 1.2   PTT 29     Recent Labs   Lab 07/16/23  0256 07/15/23  1304   Alkaline Phosphatase 65 70   Bilirubin, Total 0.3 0.4   ALT 15 14   AST (SGOT) 28 24             Signed by: Deatrice VEAR Maine, DO

## 2023-07-16 NOTE — Plan of Care (Signed)
 Problem: Moderate/High Fall Risk Score >5  Goal: Patient will remain free of falls  Outcome: Progressing  Flowsheets (Taken 07/16/2023 1954)  Moderate Risk (6-13):   MOD-Re-orient confused patients   MOD-Apply bed exit alarm if patient is confused   LOW-Fall Interventions Appropriate for Low Fall Risk     Problem: Fluid and Electrolyte Imbalance/ Endocrine  Goal: Fluid and electrolyte balance are achieved/maintained  Outcome: Progressing  Flowsheets (Taken 07/16/2023 1954)  Fluid and electrolyte balance are achieved/maintained:   Monitor/assess lab values and report abnormal values   Assess and reassess fluid and electrolyte status   Observe for cardiac arrhythmias   Monitor for muscle weakness     Problem: Fluid and Electrolyte Imbalance/ Endocrine  Goal: Adequate hydration  Outcome: Progressing  Flowsheets (Taken 07/16/2023 1954)  Adequate hydration:   Assess mucus membranes, skin color, turgor, perfusion and presence of edema   Assess for peripheral, sacral, periorbital and abdominal edema   Monitor and assess vital signs and perfusion     Problem: Neurological Deficit  Goal: Neurological status is stable or improving  Outcome: Progressing  Flowsheets (Taken 07/16/2023 1954)  Neurological status is stable or improving:   Monitor/assess/document neurological assessment (Stroke: every 4 hours)   Perform CAM Assessment

## 2023-07-16 NOTE — Plan of Care (Signed)
 Problem: Moderate/High Fall Risk Score >5  Goal: Patient will remain free of falls  Outcome: Progressing     Problem: Compromised Sensory Perception  Goal: Sensory Perception Interventions  Outcome: Progressing     Problem: Compromised Moisture  Goal: Moisture level Interventions  Outcome: Progressing     Problem: Fluid and Electrolyte Imbalance/ Endocrine  Goal: Fluid and electrolyte balance are achieved/maintained  Outcome: Progressing  Goal: Adequate hydration  Outcome: Progressing     Problem: Nutrition  Goal: Nutritional intake is adequate  Outcome: Progressing     Problem: Diabetes: Glucose Imbalance  Goal: Blood glucose stable at established goal  Outcome: Progressing     Problem: Neurological Deficit  Goal: Neurological status is stable or improving  Outcome: Progressing     Problem: Potential for Aspiration  Goal: Risk of aspiration will be minimized  Outcome: Progressing     Problem: Impaired Mobility  Goal: Mobility/Activity is maintained at optimal level for patient  Outcome: Progressing

## 2023-07-17 DIAGNOSIS — N3000 Acute cystitis without hematuria: Secondary | ICD-10-CM

## 2023-07-17 DIAGNOSIS — R404 Transient alteration of awareness: Secondary | ICD-10-CM

## 2023-07-17 DIAGNOSIS — R519 Headache, unspecified: Secondary | ICD-10-CM

## 2023-07-17 LAB — LAB USE ONLY - CBC WITH DIFFERENTIAL
Absolute Basophils: 0.03 10*3/uL (ref 0.00–0.08)
Absolute Eosinophils: 0.18 10*3/uL (ref 0.00–0.44)
Absolute Immature Granulocytes: 0.03 10*3/uL (ref 0.00–0.07)
Absolute Lymphocytes: 1.38 10*3/uL (ref 0.42–3.22)
Absolute Monocytes: 0.96 10*3/uL — ABNORMAL HIGH (ref 0.21–0.85)
Absolute Neutrophils: 3.98 10*3/uL (ref 1.10–6.33)
Absolute nRBC: 0 10*3/uL (ref <=0.00)
Basophils %: 0.5 %
Eosinophils %: 2.7 %
Hematocrit: 26.3 % — ABNORMAL LOW (ref 34.7–43.7)
Hemoglobin: 8.8 g/dL — ABNORMAL LOW (ref 11.4–14.8)
Immature Granulocytes %: 0.5 %
Lymphocytes %: 21 %
MCH: 33.1 pg (ref 25.1–33.5)
MCHC: 33.5 g/dL (ref 31.5–35.8)
MCV: 98.9 fL — ABNORMAL HIGH (ref 78.0–96.0)
MPV: 10.4 fL (ref 8.9–12.5)
Monocytes %: 14.6 %
Neutrophils %: 60.7 %
Platelet Count: 178 10*3/uL (ref 142–346)
Preliminary Absolute Neutrophil Count: 3.98 10*3/uL (ref 1.10–6.33)
RBC: 2.66 10*6/uL — ABNORMAL LOW (ref 3.90–5.10)
RDW: 13 % (ref 11–15)
WBC: 6.56 10*3/uL (ref 3.10–9.50)
nRBC %: 0 /100{WBCs} (ref <=0.0)

## 2023-07-17 LAB — CULTURE, URINE: Culture Urine: >100000 — AB

## 2023-07-17 LAB — COMPREHENSIVE METABOLIC PANEL
ALT: 17 U/L (ref <=55)
AST (SGOT): 25 U/L (ref <=41)
Albumin/Globulin Ratio: 0.6 — ABNORMAL LOW (ref 0.9–2.2)
Albumin: 2.2 g/dL — ABNORMAL LOW (ref 3.5–5.0)
Alkaline Phosphatase: 60 U/L (ref 37–117)
Anion Gap: 7 (ref 5.0–15.0)
BUN: 26 mg/dL — ABNORMAL HIGH (ref 7–21)
Bilirubin, Total: 0.3 mg/dL (ref 0.2–1.2)
CO2: 24 meq/L (ref 17–29)
Calcium: 9.2 mg/dL (ref 7.9–10.2)
Chloride: 103 meq/L (ref 99–111)
Creatinine: 1.4 mg/dL — ABNORMAL HIGH (ref 0.4–1.0)
GFR: 36.4 mL/min/{1.73_m2} — ABNORMAL LOW (ref >=60.0)
Globulin: 3.4 g/dL (ref 2.0–3.6)
Glucose: 128 mg/dL — ABNORMAL HIGH (ref 70–100)
Potassium: 4.8 meq/L (ref 3.5–5.3)
Protein, Total: 5.6 g/dL — ABNORMAL LOW (ref 6.0–8.3)
Sodium: 134 meq/L — ABNORMAL LOW (ref 135–145)

## 2023-07-17 LAB — WHOLE BLOOD GLUCOSE POCT
Whole Blood Glucose POCT: 134 mg/dL — ABNORMAL HIGH (ref 70–100)
Whole Blood Glucose POCT: 206 mg/dL — ABNORMAL HIGH (ref 70–100)
Whole Blood Glucose POCT: 158 mg/dL — ABNORMAL HIGH (ref 70–100)
Whole Blood Glucose POCT: 171 mg/dL — ABNORMAL HIGH (ref 70–100)

## 2023-07-17 LAB — PHOSPHORUS: Phosphorus: 2.6 mg/dL (ref 2.3–4.7)

## 2023-07-17 LAB — MAGNESIUM: Magnesium: 1.7 mg/dL (ref 1.6–2.6)

## 2023-07-17 MED ORDER — ALBUTEROL-IPRATROPIUM 2.5-0.5 (3) MG/3ML IN SOLN
3.0000 mL | Freq: Two times a day (BID) | RESPIRATORY_TRACT | Status: DC
Start: 2023-07-17 — End: 2023-07-18
  Administered 2023-07-17 – 2023-07-18 (×2): 3 mL via RESPIRATORY_TRACT
  Filled 2023-07-17: qty 3

## 2023-07-17 NOTE — Progress Notes (Addendum)
 INTERNAL MEDICINE PROGRESS NOTE  [facility], Division of Hospitalist Medicine  [facility]  [place] pager: [phone]      Date Time: 07/17/23 10:46 AM  Patient Name: Vanessa Kelly  Attending Physician: Lovina Estelle, MD    Assessment:   Principal Problem:    Altered mental status  Resolved Problems:    * No resolved hospital problems. *    83 y.o. female with a PMHx of GERD, diabetes, hypertension, hyperlipidemia who presented to the ED with altered mental status and was found to have sepsis secondary to UTI.     Today  is day # 2     Plan:     Acute toxic metabolic encephalopathy secondary to Sepsis, acute encephalopathy improving, patient slightly confused but alert and oriented during bedside encounter  Sepsis secondary to UTI   Acute cystitis   Patient presented with altered mental status, urinary retention for the past 3 days, UA positive for leukocyte esterase and WBCs seen on microscopy  Patient given IV ceftriaxone  in the ED along with 30 mL/kg fluid as per sepsis protocol  CT brain negative except for air-fluid level in right maxillary sinus and large air-fluid level at mastoid tip, correlate with symptoms of sinusitis/mastoiditis, no other symptoms reported by patient aside from confusion, improving with antibiotics  UA positive  CT abdomen pelvis without acute abnormality  Waxing and waning confusion  WBC is normalized .  Continue ceftriaxone , follow-up urine culture: >100,000 CFU/mL Klebsiella pneumoniae Abnormal        Elevated troponin 18.6 repeat 13,9 within normal limits   Echo done in August showed EF 65 to 70%, no history of heart failure  Likely secondary to demand  Telemetry monitoring    Hypertensive urgency  Hypertension  History of orthostatic hypotension  Blood pressure elevated 220 systolic by 90s diastolic  Hold valsartan  in view of AKI, consider resuming home medication once renal function stabilizes  Start amlodipine  5 mg daily  Hydralazine  as needed for systolic  blood pressure greater than 180    Fever     Fever 101.1 negative for Covid and flu ., will  monitor tem     Macrocytic anemia, chronic, PoA  Monitor hemoglobin    AKI likely secondary to urinary retention  Urinary retention resolved  Presented with creatinine of 1.6, baseline creatinine appears to be 1.3-1.4  IV fluids- can be stop today    Monitor renal function  Avoid nephrotoxic agents  Hold valsartan   Monitor with bladder scan and insert Foley catheter if patient is retaining urine  Vaginal foreign body seen on CT abdomen pelvis was a foley catheter, removed at bedside by ED provider       Hyponatremia   Sodium 130,  Na improved to 134   Monitor sodium levels    Diabetes. Hg A1C 5.8   Sliding scale insulin   Monitor fingerstick glucose  Recent Labs     07/17/23  0740 07/16/23  2108 07/16/23  1624 07/16/23  1105   Whole Blood Glucose POCT 134* 243* 176* 156*               Deep vein thrombosis prophylaxis Medication VTE Prophylaxis Orders: heparin  (porcine) injection 5,000 Units  Mechanical VTE Prophylaxis Orders: Maintain sequential compression device     Full Code  Supervise For Meals Frequency: All meals  Adult diet Therapeutic/ Modified; Solid; Regular (IDDSI level 7); Thin (IDDSI level 0); Consistent carbohydrate    Expect [place] date is Data Unavailable   DISPOSITION@  Case discussed with: .nurse  Family member : daughter on the phone   Consultant :   Subjective/24 hour events:     Chief Complaint   Patient presents with    Slurred Speech    Extremity Weakness    Altered Mental Status     Tele : NS                                                                               Last 24 h : no event     Today  : . LOS: 2 days  pt is alert and awake , denied any pain , stated that one month ego she have 5-7 days of diarrhea       Medications:   albuterol -ipratropium, 3 mL, BID  amLODIPine , 5 mg, Daily  aspirin  EC, 81 mg, Daily  atorvastatin , 10 mg, Daily  cefTRIAXone , 1 g, Daily at 1700  fluticasone , 2 spray,  QHS  heparin  (porcine), 5,000 Units, Q8H SCH  insulin  lispro, 1-3 Units, QHS  insulin  lispro, 1-5 Units, TID AC  lidocaine , 1 patch, Q24H  pantoprazole , 40 mg, Daily at 0600  ranolazine , 500 mg, Q12H SCH  senna-docusate, 2 tablet, QHS  sodium chloride , 30 mL/kg, Once  venlafaxine , 75 mg, QHS  vitamin D  (ergocalciferol ), 50,000 Units, Weekly      PRN Medications[1]  Physical exam:   Temp:  [99 F (37.2 C)-101.1 F (38.4 C)] 99.7 F (37.6 C)  Heart Rate:  [71-82] 73  Resp Rate:  [16-18] 16  BP: (116-159)/(62-69) 130/66    Intake/Output Summary (Last 24 hours) at 07/17/2023 1046  Last data filed at 07/17/2023 0900  Gross per 24 hour   Intake 900 ml   Output 1400 ml   Net -500 ml     General: Awake, alert, oriented, no apparent distress.  Cardiovascular: Regular rate and rhythm. No murmurs, gallops or rubs noted.  Lungs: Clear to auscultation bilaterally. No wheezing, crackles or rhonchi noted.  Abdomen: Soft, non-tender, non-distended. No organomegaly or masses noted. Normal bowel sounds. No guarding or rebound tenderness noted.  Extremities: No edema noted. 2+ pulses throughout.  Neuro: Non-focal neurological exam.              03/16/2023 06/21/2023 07/15/2023   Weight Monitoring   Height 175.3 cm  175.3 cm   Height Method   Stated   Weight 84.823 kg 86.9 kg    -- 85.7 kg   Weight Method  Bed Scale Bed Scale   BMI (calculated) 27.6 kg/m2  27.9 kg/m2       Multiple values from one day are sorted in reverse-chronological order            Labs (last 72 hours):     Recent Labs   Lab 07/17/23  0438 07/16/23  0256   WBC 6.56 8.80   Hemoglobin 8.8* 10.4*   Hematocrit 26.3* 31.7*   Platelet Count 178 149     Recent Labs   Lab 07/17/23  0438 07/16/23  0256   Sodium 134* 131*   Potassium 4.8 4.7  4.8   Chloride 103 101   CO2 24 20   BUN 26* 27*   Creatinine 1.4* 1.5*  Calcium  9.2 9.8   Albumin 2.2* 2.7*   Protein, Total 5.6* 6.6   Bilirubin, Total 0.3 0.3   Alkaline Phosphatase 60 65   ALT 17 15   AST (SGOT) 25 28   Glucose  128* 113*     Recent Labs   Lab 07/15/23  1304   PT 13.8*   INR 1.2   PTT 29           Radiology:     Radiology Results (24 Hour)       ** No results found for the last 24 hours. **            Signed by: Lovina Estelle, MD  Date/time: 07/17/23 10:46 AM          [1]   Current Facility-Administered Medications   Medication Dose Route    acetaminophen   650 mg Oral    albuterol   2.5 mg Nebulization    benzocaine -menthol   1 lozenge Buccal    benzonatate   100 mg Oral    calcium  carbonate  1 tablet Oral    carboxymethylcellulose sodium  1 drop Both Eyes    dextrose   15 g of glucose Oral    Or    dextrose   12.5 g Intravenous    Or    dextrose   12.5 g Intravenous    Or    glucagon  (rDNA)  1 mg Intramuscular    guaiFENesin -dextromethorphan   5 mL Oral    hydrALAZINE   10 mg Intravenous    melatonin  3 mg Oral    naloxone   0.2 mg Intravenous    ondansetron   4 mg Oral    Or    ondansetron   4 mg Intravenous    polyethylene glycol  17 g Oral    saline  2 spray Each Nare    sucralfate   1 g Oral

## 2023-07-17 NOTE — PT Progress Note (Signed)
 Physical Therapy Evaluation  [name]      Post Acute Care Therapy Recommendations:     Discharge Recommendations:  Home with supervision, Home with home health PT (return to ALF)    DME needs IF patient is discharging home: No additional equipment/DME recommended at this time, Patient already has needed equipment    Therapy discharge recommendations may change with patient status.  Please refer to most recent note for up-to-date recommendations.           [facility]  [address]  [place], [place] [address]  [phone]    Physical Therapy Evaluation    Patient: Vanessa Kelly MRN: [id number]   Unit: [facility] INTERMEDIATE CARE Bed: MI621/MI621-02    Time of Treatment:   PT Received On: 07/17/23  Start Time: 1522  Stop Time: 1553  Time Calculation (min): 31 min       Consult received for [name] for PT evaluation and treatment.  Patient's medical condition is appropriate for Physical Therapy  intervention at this time.      Assessment   Vanessa Kelly is a 83 y.o. female admitted 07/15/2023 for acute AMS and dry cough; suspected to AME in the setting of suspected sepsis from UTI. At baseline, pt AAOx4 and modI with ADLs, gait with rollator. Pt lives at [facility] and eats all meals at dining hall.     Pt semi-supine in bed with HOB elevated upon arrival. Pt pleasant and agreeable to participate in PT evaluation. Pt completes bed mobility with spv. Pt requests second gown; minA to initiate but spv for pt to complete donning. Pt amb slowly with RW; pt reports history of tremors d/t history of orthostatic hypotension, which she reports are mild today , but that tremors can worsen at any time. Upon return to room, pt requests to use toilet; SBA for toilet transfer with one v/c to use grab bars. Pt voids, but unsuccessful with attempt to have BM. Pt then returns to bed asking for external catheter. Education regarding risk of skin breakdown and infection completed; pt verbalizes  understanding, but requests to use external catheter as backup method in case nursing staff cannot attend to pt quickly. RN and tech notified as well. Pt encouraged to walk with nursing staff as able throughout the day; in agreement. Pt requires skilled PT interventions in order to increase pt independence with functional mobility, gait, activity tolerance and to reduce pt risk of falls.        PMP - Progressive Mobility Protocol   PMP Activity: Step 7 - Walks out of Room  Distance Walked (ft) (Step 6,7): 160 Feet       Rehabilitation Potential: Good       Interdisciplinary Communication: RN      Plan     Plan  Risks/Benefits/POC Discussed with Pt/Family: With patient  Treatment/Interventions: Exercise;Gait training;Neuromuscular re-education;Functional transfer training;LE strengthening/ROM;Endurance training;Bed mobility  PT Frequency: 3-4x/wk         Medical Diagnosis: Wheezing [R06.2]  Altered mental status [R41.82]  Acute cystitis without hematuria [N30.00]  Acute cough [R05.1]    History of Present Illness: Vanessa Kelly is a 83 y.o. female admitted on  07/15/2023 with a history of diabetes, hypertension, GERD, and coronary artery bypass, who presents with generalized fatigue, somnolence sleeping  throughout the day and more confused than usual according to the daughter who video chats with her daily from [place] . These symptoms occurred over 24-48 hours ago. The patient's  daughter, who checks in daily via video call, noticed a decline in the patient's condition, including shaking and being out of sorts.  She has a dry cough, which is new and severe enough to cause headaches. She also reports abdominal tenderness in the bilateral upper quadrants. The patient has a history of a gastrointestinal virus, which had a significant impact on her health for several weeks. She had recovered from this and was able to socialize and move around before the recent decline in her health per ED provider note 07/15/23.        Problem List[1]  Medical History[2]  Past Surgical History[3]    Tests/Labs:  Lab Results   Component Value Date/Time    HGB 8.8 (L) 07/17/2023 04:38 AM    HGB 10.4 (L) 06/28/2022 06:18 AM    HCT 26.3 (L) 07/17/2023 04:38 AM    HCT 31.8 (L) 06/28/2022 06:18 AM    K 4.8 07/17/2023 04:38 AM    K 4.5 06/28/2022 06:18 AM    NA 134 (L) 07/17/2023 04:38 AM    NA 140 06/28/2022 06:18 AM    INR 1.2 07/15/2023 01:04 PM    TROPI 13.9 07/15/2023 04:17 PM    TROPI 18.6 (H) 07/15/2023 01:04 PM    TROPI 13.0 12/05/2022 09:46 AM    TROPI 40.3 (H) 12/04/2022 01:34 PM    TROPI 6.7 06/23/2022 06:41 PM    TROPI <2.7 01/18/2022 05:15 PM    TROPI <0.01 01/21/2021 02:30 PM         Imaging   CT Abd/Pelvis without Contrast    Result Date: 07/15/2023  1. No acute abnormal findings are identified. No explanation for acute symptoms. 2. Cholecystectomy. 3. Moderate hiatal hernia. 4. Hysterectomy. 5. Foreign body in the vaginal lumen of unclear significance. 6. Diverticulosis of the colon. Deward Holt, MD 07/15/2023 3:44 PM    XR Chest  AP Portable    Result Date: 07/15/2023   No acute disease. Toribio Hering, MD 07/15/2023 2:45 PM    CT Head WO Contrast    Result Date: 07/15/2023   1.No acute intracranial abnormality. 2.Mild chronic small vessel ischemic changes. 3.Air-fluid level in right maxillary sinus and large air-fluid level at the mastoid tip air cells on the right. Correlation with symptoms of acute on chronic sinusitis and mastoiditis is recommended. Nelya Ebadirad 07/15/2023 1:52 PM     Social History: (Per pt)  Lives at [facility] in an apartment.  Entry Steps: 0   Inside steps: elevator access/use    Equipment at home:  rollator, walk in shower, grab bars in shower, shower chair, raised toilet seat, and grab bar around toilet area  Prior Level of Function:  light meal prep; eats all meals in dining hall (~40-50 ft to/from elevator to/from apartment and dining hall). Pt reports she attends activities in the community at  times   Cognition: [facility]    Mobility/Locomotion: modI with rollator   Feeding: independent   Grooming: independent    Bathing: modI with shower chair   Dressing: independent    Toileting: independent; uses external catheter home system at night    Subjective   It feels so good to be up and moving. Pt in agreement to try to amb to bathroom with nursing staff; in agreement to sit up in chair for all meals. Pt agreeable to home health services reporting she has been participating in HHPT/OT on and off since moving in to ALF.  Patient is agreeable to participation in  the therapy session. Nursing clears patient for therapy.  Patient's Goal:  to go home   Pain: pt denies pain and no evidence of pain noted during session      Objective     Precautions/ Contraindications:   Precautions  Weight Bearing Status: no restrictions  Other Precautions: Fall, history of orthostatic hypotension    Patient is in bed with telemetry, PrimaFit (external female catheter), intravenous access, and bed alarm in place.       Observation of patient/vitals:      Vitals:    07/17/23 0848 07/17/23 0954 07/17/23 1145 07/17/23 1602   BP: 134/67 130/66 135/69 138/67   Pulse: 73  78 79   Resp:       Temp: 99.7 F (37.6 C)  99 F (37.2 C) 98.8 F (37.1 C)   TempSrc: Oral  Oral Oral   SpO2: 98%  100% 99%   Weight:       Height:           Orientation/Cognition:  Alert and Oriented x 4  Cognition: WFL      Musculoskeletal Examination:      ROM Strength   RLE WFL WFL   LLE WFL WFL       Functional Mobility:  Rolling: spv    Supine to sit: spv  Scooting: spv  Sit to Supine: SBA   Sit to stand: SBA with RW  Stand to sit: SBA with v/c for controlled descent  Transfers: SBA toilet transfer  Ambulation:     Assistance level: SBA   Distance: 160 ft    Assistive Device: RW   Gait Deviations: decreased cadence, occasionally forward flexed posture    Therapeutic Intervention ( 10 Minutes): don/doff second gown like a jacket, toileting, peri-care, hand hygiene        Balance:  Static Sit: Good  Dynamic Sit: Good  Static Stand: Good  Dynamic Stand: Fair (+)    Endurance: Good    Participation:  Good    Education:  Educated the patient to role of physical therapy, plan of care, goals  of therapy, rationale for progressing mobility and safety with mobility and ADLs.    RN notified of session outcome and that patient was left in bed comfortably positioned as requested with all needs met and equipment intact.   Safety measures include: handoff to nurse/clin tech/ unit secretary completed, bed alarm activated, oriented to call bell and placed within reach, personal items within reach, assistive device positioned out of reach, and bed placed in lowest position.   Mobility and ADL status posted at bedside and within E.M.R.    AM-PACT Inpatient Short Forms  Inpatient AM-PACT Performed? (PT): Basic Mobility Inpatient Short Form   AM-PACT 6 Clicks Basic Mobility Inpatient Short Form  Turning Over in Bed: A little  Sitting Down On/Standing From Armchair: A little  Lying on Back to Sitting on Side of Bed: A little  Assist Moving to/from Bed to Chair: A little  Assist to Walk in Hospital Room: A little  Assist to Climb 3-5 Steps with Railing: A little  PT Basic Mobility Raw Score: 18  CMS 0-100% Score: 46.58%      Goals  Goal Formulation: With patient  Time for Goal Acheivement: 7 visits  Goals: Select goal  Pt Will Roll Left: modified independent  Pt Will Roll Right: modified independent  Pt Will Perform Sit To Supine: modified independent  Pt Will Perform Sit to Stand: modified independent (with RW)  Pt Will Transfer Bed/Chair: modified independent (with RW)  Pt Will Ambulate: 151-200 feet;with rolling walker;modified independent                Therapist PPE during session procedural mask and gloves     Signature:  Anastasia Sofia, PT, DPT  07/17/2023  4:04 PM       (For scheduling questions, please contact rehab tech x [phone])             [1]   Patient Active Problem  List  Diagnosis    Headache    Mastoiditis    Dizziness    Altered mental status   [2]   Past Medical History:  Diagnosis Date    Diabetes mellitus (CMS/HCC)     Gastroesophageal reflux disease     Hyperlipidemia     Hypertension     Hypotension     Seasonal allergic rhinitis     Syncope    [3]   Past Surgical History:  Procedure Laterality Date    BREAST BIOPSY Right     2014    CORONARY ARTERY BYPASS      HYSTERECTOMY      [phone]

## 2023-07-17 NOTE — Progress Notes (Signed)
 Respiratory Therapy Patient Assessment    MI621/MI621-02  07/17/23 7:40 AM  RT: Georgine Lek, RT      Admitting DX: Wheezing [R06.2]  Altered mental status [R41.82]  Acute cystitis without hematuria [N30.00]  Acute cough [R05.1]    Pulmonary History: None    Other Pulm Hx:      Therapy ordered:       Respiratory Orders   (From admission, onward)                 Start     Ordered    07/16/23 1614  Nebulizer treatment intermittent  Every 6 hours scheduled (RT)      Comments:   All Adult patients ordered for Respiratory Therapy, i.e., inhaled meds, secretion clearance/lung expansion or Oxygen greater than 5 liters/min will be evaluated by a Respiratory Therapist and assessed per Respiratory Therapy Patient Driven Protocol.  Initial assessment and changes made per protocol can be found in the progress note section of the patient chart.    07/16/23 1613                   IP Meds - Nasal and Inhaled (From admission, onward)      Start     Stop Status Route Frequency Ordered    07/16/23 1400  albuterol -ipratropium (DUO-NEB) 2.5-0.5(3) mg/3 mL nebulizer 3 mL         -- Dispensed NEBULIZATION RT - Every 6 hours scheduled 07/16/23 1145    07/15/23 1732  albuterol  (PROVENTIL ) (2.5 MG/3ML) 0.083% nebulizer solution 2.5 mg         -- Verified NEBULIZATION RT - Every 6 hours as needed 07/15/23 1732               PT able to take deep breath? Yes               Airway: Natural   Mobility: Ambulatory with or without assistance  CXR: No acute disease.    Cough Effort: Moderate            Can clear secretions with cough? Yes  Can clear secretions with suctioning? N/A     Tobacco Use History[1]     Breath Sounds:  Bilateral Breath Sounds: Diminished          Heart Rate: 73 Resp Rate: 16  SpO2: 98 % O2 Device: None (Room air)          Home regimen:              Criteria for therapy:        Medications: Bronchospasms or documented bronchospasms in the last 24 hrs    Recommendations/Interventions:        Expected Outcomes:        Meds:  Decreased bronchospasms      Re-Evaluation:  Follow-up Date:   Improving with Therapy: yes    Plan of Care Recommendations:           [1]   Social History  Tobacco Use   Smoking Status Former    Current packs/day: 0.00    Types: Cigarettes    Quit date: 1990    Years since quitting: 35.2   Smokeless Tobacco Never

## 2023-07-17 NOTE — Plan of Care (Signed)
 [facility] rounding template   07/17/2023    Brief  background  -Pt arrived with Chief Complaint of Slurred Speech, Extremity Weakness, and Altered Mental Status  .   Pt length of stay is day 2  Complained of cough cough medicine given as ordered.   -Last 24 hours critical events : N/A   -Abnormal labs/ VS  N/A  -Respiratory  : RA  -GI/GU  Stool for the past 24 hrs:   [place] ONLY [place] Stool Scale   07/16/23 1932 Stool Not  Available       Intake/Output Summary (Last 24 hours) at 07/17/2023 0525  Last data filed at 07/17/2023 0524  Gross per 24 hour   Intake 1460 ml   Output 1700 ml   Net -240 ml     .No data found.    -Neuro changes: X4   -Nutrition Questions: N/A  -Activity / Safety/ mobility questions  N/A  -LDA access issues, line removal: N/A  -Weight trend  Last 3 Weights for the past 72 hrs (Last 3 readings):   Weight   07/15/23 1242 85.7 kg (188 lb 15 oz)        -Tele/Rhythm:   NSR   -Skin/Wounds issus/questions: N/A  -VTE prophylaxis:   Medication VTE Prophylaxis Orders: heparin  (porcine) injection 5,000 Units  Mechanical VTE Prophylaxis Orders: Maintain sequential compression device  -Upcoming Procedures : N/A   -Discharge Date/ Plans: TBD  -Questions/ orders needed: N/A

## 2023-07-17 NOTE — Plan of Care (Signed)
 Problem: Moderate/High Fall Risk Score >5  Goal: Patient will remain free of falls  Outcome: Progressing  Flowsheets  Taken 07/16/2023 2200 by Jadene Kaufmann, RN  Moderate Risk (6-13):   MOD-Apply bed exit alarm if patient is confused   MOD-Re-orient confused patients  Taken 07/16/2023 0900 by Latisha Bravo, RN  High (Greater than 13):   HIGH-Bed alarm on at all times while patient in bed   HIGH-Consider use of low bed

## 2023-07-17 NOTE — Progress Notes (Signed)
 07/17/23 1302   Patient Type   Within 30 Days of Previous Admission? No   Healthcare Decisions   Interviewed: Family   Name of interviewee if other than the pt: vicci asal (Daughter)  [phone]   Interviewee Contact Information: [phone]   Orientation/Decision Making Abilities of Patient Confused, unable to make decisions   Advance Directive Patient has advance directive, copy not in chart   Prior to admission   Prior level of function Ambulates with assistive device;Needs assistance with ADLs   Type of Residence Assisted living  [facility])   Home Layout Able to live on main level with bedroom/bathroom;Ramped entrance   Have running water , electricity, heat, etc? Yes   Living Arrangements Alone;Other (Comment)  Cabin Crew)   How do you get to your MD appointments? Facility staff/Daughter   How do you get your groceries? Facility staff/Daughter   Who fixes your meals? Facility staff/Daughter   Who does your laundry? Facility staff/Daughter   Who picks up your prescriptions? Facility staff/Daughter   Dressing Needs assistance   Grooming Needs assistance   Feeding Independent   Bathing Needs assistance   Toileting Needs assistance   DME Currently at [facility], [employer], [employer];ADLChief Financial Officer   Name of Prior Assisted Living Facility [facility] (Current)   Discharge Planning   Support Systems Children;Family members;Other (Comment)  Cabin Crew)   Patient expects to be discharged to: Return to ALF [facility]); pending therapy evals/recs; possible medical transport   Anticipated [place] plan discussed with: Friend;Same as interviewed   Mode of transportation: Other  (TBD; possible medical transport)   Does the patient have perscription coverage? Yes   Financial Resource Strain   How hard is it for you to pay for the very basics like food, housing, medical care, and heating? Not hard   Housing Stability   In the last 12 months, was there a time when you were  not able to pay the mortgage or rent on time? N   In the past 12 months, how many times have you moved where you were living? 0   At any time in the past 12 months, were you homeless or living in a shelter (including now)? N   Transportation Needs   In the past 12 months, has lack of transportation kept you from medical appointments or from getting medications? no   In the past 12 months, has lack of transportation kept you from meetings, work, or from getting things needed for daily living? No   Consults/Providers   PT Evaluation Needed 1   OT Evalulation Needed 1   SLP Evaluation Needed 1   Correct PCP listed in Epic? Yes   Family and PCP   PCP on file was verified as the current PCP? Yes   In case you are admitted, transferred or discharged, would like family notified? Yes   Name of family member to be notified Daughter   In case you are admitted, transferred or discharged, would like your PCP notified? Yes

## 2023-07-17 NOTE — Progress Notes (Signed)
 07/17/23 1000   Volunteer Chaplain   Visit Type Initial   Source Chaplain Initiated   Present at Visit Patient   Spiritual Care Provided to patient   Reason for Visit Spiritual Support   Spiritual Care Interventions Provided prayer   Spiritual Care Outcomes Patient expressed appreciation of visit   Length of Visit 0-15 minutes   Follow-up Follow-up routine (3)

## 2023-07-18 ENCOUNTER — Inpatient Hospital Stay

## 2023-07-18 ENCOUNTER — Ambulatory Visit (INDEPENDENT_AMBULATORY_CARE_PROVIDER_SITE_OTHER): Admitting: Internal Medicine

## 2023-07-18 ENCOUNTER — Ambulatory Visit

## 2023-07-18 LAB — LAB USE ONLY - CBC WITH DIFFERENTIAL
Absolute Basophils: 0.05 10*3/uL (ref 0.00–0.08)
Absolute Eosinophils: 0.31 10*3/uL (ref 0.00–0.44)
Absolute Immature Granulocytes: 0.04 10*3/uL (ref 0.00–0.07)
Absolute Lymphocytes: 1.7 10*3/uL (ref 0.42–3.22)
Absolute Monocytes: 1.29 10*3/uL — ABNORMAL HIGH (ref 0.21–0.85)
Absolute Neutrophils: 3.74 10*3/uL (ref 1.10–6.33)
Absolute nRBC: 0 10*3/uL (ref <=0.00)
Basophils %: 0.7 %
Eosinophils %: 4.3 %
Hematocrit: 27 % — ABNORMAL LOW (ref 34.7–43.7)
Hemoglobin: 9 g/dL — ABNORMAL LOW (ref 11.4–14.8)
Immature Granulocytes %: 0.6 %
Lymphocytes %: 23.8 %
MCH: 32.4 pg (ref 25.1–33.5)
MCHC: 33.3 g/dL (ref 31.5–35.8)
MCV: 97.1 fL — ABNORMAL HIGH (ref 78.0–96.0)
MPV: 10.5 fL (ref 8.9–12.5)
Monocytes %: 18.1 %
Neutrophils %: 52.5 %
Platelet Count: 209 10*3/uL (ref 142–346)
Preliminary Absolute Neutrophil Count: 3.74 10*3/uL (ref 1.10–6.33)
RBC: 2.78 10*6/uL — ABNORMAL LOW (ref 3.90–5.10)
RDW: 13 % (ref 11–15)
WBC: 7.13 10*3/uL (ref 3.10–9.50)
nRBC %: 0 /100{WBCs} (ref <=0.0)

## 2023-07-18 LAB — MAGNESIUM: Magnesium: 1.8 mg/dL (ref 1.6–2.6)

## 2023-07-18 LAB — COMPREHENSIVE METABOLIC PANEL
ALT: 21 U/L (ref <=55)
AST (SGOT): 29 U/L (ref <=41)
Albumin/Globulin Ratio: 0.6 — ABNORMAL LOW (ref 0.9–2.2)
Albumin: 2.4 g/dL — ABNORMAL LOW (ref 3.5–5.0)
Alkaline Phosphatase: 70 U/L (ref 37–117)
Anion Gap: 8 (ref 5.0–15.0)
BUN: 22 mg/dL — ABNORMAL HIGH (ref 7–21)
Bilirubin, Total: 0.3 mg/dL (ref 0.2–1.2)
CO2: 23 meq/L (ref 17–29)
Calcium: 9.9 mg/dL (ref 7.9–10.2)
Chloride: 104 meq/L (ref 99–111)
Creatinine: 1.3 mg/dL — ABNORMAL HIGH (ref 0.4–1.0)
GFR: 39.8 mL/min/{1.73_m2} — ABNORMAL LOW (ref >=60.0)
Globulin: 3.7 g/dL — ABNORMAL HIGH (ref 2.0–3.6)
Glucose: 128 mg/dL — ABNORMAL HIGH (ref 70–100)
Potassium: 4.7 meq/L (ref 3.5–5.3)
Protein, Total: 6.1 g/dL (ref 6.0–8.3)
Sodium: 135 meq/L (ref 135–145)

## 2023-07-18 LAB — WHOLE BLOOD GLUCOSE POCT
Whole Blood Glucose POCT: 132 mg/dL — ABNORMAL HIGH (ref 70–100)
Whole Blood Glucose POCT: 192 mg/dL — ABNORMAL HIGH (ref 70–100)
Whole Blood Glucose POCT: 172 mg/dL — ABNORMAL HIGH (ref 70–100)
Whole Blood Glucose POCT: 128 mg/dL — ABNORMAL HIGH (ref 70–100)

## 2023-07-18 LAB — LAB USE ONLY - MORPHOLOGY REVIEW
Platelet Estimate: NORMAL
RBC Morphology: ABNORMAL — AB

## 2023-07-18 LAB — PHOSPHORUS: Phosphorus: 2.9 mg/dL (ref 2.3–4.7)

## 2023-07-18 MED ORDER — AZITHROMYCIN 500 MG PO TABS
500.0000 mg | ORAL_TABLET | Freq: Every day | ORAL | 0 refills | Status: DC
Start: 2023-07-18 — End: 2023-07-18

## 2023-07-18 MED ORDER — CEFDINIR 300 MG PO CAPS
300.0000 mg | ORAL_CAPSULE | Freq: Two times a day (BID) | ORAL | Status: DC
Start: 2023-07-18 — End: 2023-07-18

## 2023-07-18 MED ORDER — CEFDINIR 300 MG PO CAPS
300.0000 mg | ORAL_CAPSULE | Freq: Two times a day (BID) | ORAL | 0 refills | Status: DC
Start: 2023-07-19 — End: 2023-07-18

## 2023-07-18 MED ORDER — AZITHROMYCIN 500 MG PO TABS
500.0000 mg | ORAL_TABLET | Freq: Every day | ORAL | 0 refills | Status: AC
Start: 2023-07-18 — End: 2023-07-25

## 2023-07-18 MED ORDER — AZITHROMYCIN 500 MG IN 250 ML NS IVPB VIAL-MATE (CNR)
500.0000 mg | INTRAVENOUS | Status: DC
Start: 2023-07-18 — End: 2023-07-19
  Administered 2023-07-18 – 2023-07-19 (×2): 500 mg via INTRAVENOUS
  Filled 2023-07-18 (×2): qty 250

## 2023-07-18 MED ORDER — CEFDINIR 300 MG PO CAPS
300.0000 mg | ORAL_CAPSULE | Freq: Two times a day (BID) | ORAL | Status: DC
Start: 2023-07-19 — End: 2023-07-18

## 2023-07-18 MED ORDER — CEFDINIR 300 MG PO CAPS
300.0000 mg | ORAL_CAPSULE | Freq: Two times a day (BID) | ORAL | 0 refills | Status: AC
Start: 2023-07-19 — End: 2023-07-24

## 2023-07-18 NOTE — Discharge Summary (Addendum)
 DISCHARGE SUMMARY/PROGRESS NOTE      Date Time: 07/19/23 2:01 PM  Patient Name: Vanessa Kelly  Attending Physician: Theresia Lo, MD  Primary Care Physician: Alinda Dooms, NP    Date of Admission:   07/15/2023    Date of Discharge:   07/19/2023     Admitting Diagnosis:     Active Hospital Problems    Diagnosis POA    Principal Problem: Altered mental status Yes      Resolved Hospital Problems   No resolved problems to display.     Acute UTI   Acute cystitis   Acute pneumonia     Discharge Dx:   Principal Diagnosis: Altered mental status  Active Hospital Problems    Diagnosis    Altered mental status       Consultations:   Treatment Team:   Theresia Lo, MD  A[place]ah, Comfort A, RN  Chimienti, Hilda Lias, RN  Williams Che, PT  Valetta Close, RN     Pending Results, Recommendations & Instructions to providers after discharge:     Micro / Labs / Path pending:  Unresulted Labs       None            Procedures performed:   Radiology: all results from this admission  XR Chest 2 Views    Result Date: 07/18/2023   1. Increased perihilar densities could represent pulmonary vascular congestion or atypical infection. 2. Chronic left effusion and adjacent subsegmental atelectasis. 3. Stable cardiomegaly Kinnie Feil, MD 07/18/2023 3:30 PM    CT Abd/Pelvis without Contrast    Result Date: 07/15/2023  1. No acute abnormal findings are identified. No explanation for acute symptoms. 2. Cholecystectomy. 3. Moderate hiatal hernia. 4. Hysterectomy. 5. Foreign body in the vaginal lumen of unclear significance. 6. Diverticulosis of the colon. Wynema Birch, MD 07/15/2023 3:44 PM    XR Chest  AP Portable    Result Date: 07/15/2023   No acute disease. Rocky Crafts, MD 07/15/2023 2:45 PM    CT Head WO Contrast    Result Date: 07/15/2023   1.No acute intracranial abnormality. 2.Mild chronic small vessel ischemic changes. 3.Air-fluid level in right maxillary sinus and large air-fluid level at the mastoid tip air cells on the  right. Correlation with symptoms of acute on chronic sinusitis and mastoiditis is recommended. Nelya Ebadirad 07/15/2023 1:52 PM    CT Abd/ Pelvis with IV Contrast    Result Date: 06/21/2023  1. Moderate air and fluid distend the majority of the small bowel and colon, without appreciable thickening: consider gastroenteritis with fluid-feces. Advise follow-up exam if symptoms persist or worsen. 2. No definite obstruction, abscess, or free air. 3. Large hiatal hernia. 4. Remainder as above. Wilmon Pali, MD 06/21/2023 5:57 PM    Shoulder Right 2+ Views    Result Date: 06/21/2023  1.No evidence for fracture or dislocation of the right clavicle. 2.No evidence for periprosthetic fracture or osteolysis of the right shoulder arthroplasty. However, osteopenia limits evaluation. If there is persistent clinical concern for occult fracture or osteolysis, further evaluation with CT or MRI is recommended. Sandie Ano, MD 06/21/2023 10:49 AM    Clavicle Right    Result Date: 06/21/2023  1.No evidence for fracture or dislocation of the right clavicle. 2.No evidence for periprosthetic fracture or osteolysis of the right shoulder arthroplasty. However, osteopenia limits evaluation. If there is persistent clinical concern for occult fracture or osteolysis, further evaluation with CT or MRI is recommended. Sandie Ano, MD 06/21/2023 10:49 AM  Hospital Course:       Acute toxic metabolic encephalopathy secondary to Sepsis, acute encephalopathy improving, patient slightly confused but alert and oriented during bedside encounter  Sepsis secondary to UTI  and possible pneumonia   Acute cystitis   Patient presented with altered mental status, urinary retention for the past 3 days, UA positive for leukocyte esterase and WBCs seen on microscopy  Patient given IV ceftriaxone in the ED along with 30 mL/kg fluid as per sepsis protocol  CT brain negative except for air-fluid level in right maxillary sinus and large air-fluid level at mastoid tip,  correlate with symptoms of sinusitis/mastoiditis, no other symptoms reported by patient aside from confusion, improving with antibiotics  UA positive  CT abdomen pelvis without acute abnormality  Waxing and waning confusion  WBC is normalized .  Continue ceftriaxone, follow-up urine culture: >100,000 CFU/mL Klebsiella pneumoniae Abnormal          Elevated troponin 18.6 repeat 13,9 within normal limits   Echo done in August showed EF 65 to 70%, no history of heart failure  Likely secondary to demand  Telemetry monitoring     Hypertensive urgency  Hypertension  History of orthostatic hypotension  Blood pressure elevated 220 systolic by 90s diastolic  Hold valsartan in view of AKI,  resuming home medication  on discharge      echocardiogram 2024 reviewed EF 65-70% ,     Fever 101.1 negative for Covid and flu ., will  monitor tem    She reports cough today , denied any sob or chest pain , she wants to get CXR as she has similar  symptom last year when she had pneumonia   - on exam : she has crackles on left side   -  CXR :3/15 No acute disease , CXR ap and lat 3/18 : . Increased perihilar densities could represent pulmonary vascular   congestion or atypical infection- Procalcitonic 0.45   - added Azithromycin  , cw Rocephin   - change to po on [place]     Macrocytic anemia, chronic, PoA       AKI likely secondary to urinary retention  Urinary retention resolved  Presented with creatinine of 1.6, baseline creatinine appears to be 1.3-1.4 improved w fluid     Hold valsartan- can be resume on [place] ,pt only take it as needed as she has orthostatic hypotension as well     Monitor with bladder scan and insert Foley catheter if patient is retaining urine  Vaginal foreign body seen on CT abdomen pelvis was a foley catheter, removed at bedside by ED provider       Hyponatremia   Sodium 130,  Na improved to 134 - 139        Diabetes. Hg A1C 5.8   Sliding scale insulin     Recent Labs     07/19/23  1153 07/19/23  0746 07/18/23  2112  07/18/23  1615   Whole Blood Glucose POCT 186* 121* 128* 172*                  Patient condition at discharge:   DISCHARGE DAY EXAM:  Today:  BP 145/61   Pulse 69   Temp 97.7 F (36.5 C) (Oral)   Resp 17   Ht 1.753 m (5\' 9" )   Wt 82.5 kg (181 lb 14.1 oz)   SpO2 100%   BMI 26.86 kg/m   Ranges for the last 24 hours:  Temp:  [97.7 F (36.5  C)-98.8 F (37.1 C)] 97.7 F (36.5 C)  Heart Rate:  [67-80] 69  Resp Rate:  [16-21] 17  BP: (145-166)/(58-82) 145/61  Body mass index is 26.86 kg/m.  General: awake, no acute distress.  Cardiovascular: regular rate and rhythm, no murmurs, rubs or gallops  Lungs: clear to auscultation bilaterally, without wheezing, rhonchi, or rales  Abdomen: soft, non-tender, non-distended; no palpable masses, no hepatosplenomegaly, normoactive bowel sounds, no rebound or guarding  Extremities: no clubbing, cyanosis, or edema  Neurological:  alert, oriented x 3      Labs/Procedures/Radiology performed:     Recent Labs   Lab 07/19/23  0450 07/18/23  0512 07/17/23  0438 07/16/23  0256 07/15/23  1304   WBC 7.22 7.13 6.56 8.80 10.42*   Hemoglobin 9.0* 9.0* 8.8* 10.4* 9.5*   Hematocrit 26.6* 27.0* 26.3* 31.7* 28.3*   Platelet Count 215 209 178 149 170    Recent Labs   Lab 07/15/23  1304   PT 13.8*   INR 1.2   PTT 29        Recent Labs   Lab 07/19/23  0450 07/18/23  0512 07/17/23  0438 07/16/23  0256 07/15/23  1304   Sodium 139 135 134* 131* 130*   Potassium 4.9 4.7 4.8 4.7  4.8 4.5   Chloride 107 104 103 101 100   CO2 25 23 24 20 23    BUN 22* 22* 26* 27* 23*   Creatinine 1.2* 1.3* 1.4* 1.5* 1.6*   GFR 43.8* 39.8* 36.4* 33.5* 31.0*   Glucose 120* 128* 128* 113* 195*   Calcium 10.2 9.9 9.2 9.8 9.6    Recent Labs   Lab 07/19/23  0450 07/18/23  0512   Alkaline Phosphatase 68 70   Bilirubin, Total 0.3 0.3   Protein, Total 6.1 6.1   Albumin 2.4* 2.4*   ALT 21 21   AST (SGOT) 27 29              Invalid input(s): "FREET4"    Microbiology Results (last 15 days)       Procedure Component Value Units  Date/Time    Culture, Blood, Aerobic And Anaerobic [[id number]] Collected: 07/15/23 1925    Order Status: Canceled Specimen: Blood, Venous Updated: 07/15/23 1942    Culture, Blood Pediatric (3-24ml), Aerobic Only [[id number]] Collected: 07/15/23 1924    Order Status: Completed Specimen: Blood, Venous Updated: 07/19/23 0200     Culture Blood No growth at 3 days    Culture, Blood, Aerobic And Anaerobic [[id number]] Collected: 07/15/23 1920    Order Status: Canceled Specimen: Blood, Venous Updated: 07/15/23 1939    Culture, Blood Pediatric (3-78ml), Aerobic Only [[id number]] Collected: 07/15/23 1920    Order Status: Completed Specimen: Blood, Venous Updated: 07/19/23 0200     Culture Blood No growth at 3 days    COVID-19 and Influenza ([place]) (symptomatic) [[id number]]  (Normal) Collected: 07/15/23 1710    Order Status: Completed Specimen: Swab from Anterior Nares Updated: 07/15/23 1807     SARS-CoV-2 (COVID-19) RNA Not Detected     Influenza A RNA Not Detected     Influenza B RNA Not Detected    Narrative:      A result of "Detected" indicates POSITIVE for the presence of viral RNA  A result of "Not Detected" indicates NEGATIVE for the presence of viral RNA    Test performed using the Roche cobas [place] SARS-CoV-2 & Influenza A/B assay. This is a multiplex real-time RT-PCR assay for the detection of SARS-CoV-2, influenza  A, and influenza B virus RNA. Viral nucleic acids may persist in vivo, independent of viability. Detection of viral nucleic acid does not imply the presence of infectious virus, or that virus nucleic acid is the cause of clinical symptoms. Negative results do not preclude SARS-CoV-2, influenza A, and/or influenza B infection and should not be used as the sole basis for diagnosis, treatment or other patient management decisions. Invalid results may be due to inhibiting substances in the specimen and recollection should occur.     Culture, Blood, Aerobic And Anaerobic [[id number]] Collected: 07/15/23 1647     Order Status: Canceled Specimen: Blood, Venous     Culture, Urine [[id number]]  (Abnormal)  (Susceptibility) Collected: 07/15/23 1509    Order Status: Completed Specimen: Urine, Clean Catch Updated: 07/17/23 1950     Culture Urine >100,000 CFU/mL Klebsiella pneumoniae    Susceptibility        Klebsiella pneumoniae      MIC     Amoxicillin/Clavulanic acid <=4/2 ug/mL Susceptible     Ampicillin >16 ug/mL Resistant  [1]      Ampicillin/Sulbactam 4/2 ug/mL Susceptible     Aztreonam <=2 ug/mL Susceptible     Cefazolin <=1 ug/mL Susceptible  [2]      Cefepime <=1 ug/mL Susceptible     Cefoxitin <=4 ug/mL Susceptible     Ceftazidime <=2 ug/mL Susceptible     Ceftriaxone <=1 ug/mL Susceptible  [3]      Cefuroxime <=4 ug/mL Susceptible     Ciprofloxacin <=0.25 ug/mL Susceptible     Ertapenem <=0.25 ug/mL Susceptible     Gentamicin <=2 ug/mL Susceptible     Levofloxacin <=0.5 ug/mL Susceptible     Meropenem <=0.5 ug/mL Susceptible     Nitrofurantoin 64 ug/mL Intermediate  [4]      Piperacillin/Tazobactam 4/4 ug/mL Susceptible     Tetracycline <=2 ug/mL Susceptible  [5]      Trimethoprim/Sulfamethoxazole <=0.5/9.5 ug/mL Susceptible                  [1]  If oral therapy is desired AND ampicillin is susceptible, consider amoxicillin. [place] [facility] Nov. 2020     [2]  Cefazolin interpretation is for uncomplicated UTI only. If oral therapy is desired for uncomplicated UTI AND K. pneumoniae is susceptible to cefazolin, consider cephalexin, cefdinir, cefpodoxime, or cefprozil. [place] [facility] Nov. 2020     [3]  Ceftriaxone does not predict cefdinir susceptibility. [place] [facility] Nov. 2020     [4]  Nitrofurantoin should only be used for the treatment of uncomplicated cystitis. [place] [facility] June 2015.     [5]  Enterobacterales susceptible to tetracycline are also considered susceptible to doxycycline and minocycline. CLSI M100-ED33:2023                                     Discharge Medications:        Medication List        START taking these medications      azithromycin 500 MG tablet  Commonly known as: ZITHROMAX  Take 1 tablet (500 mg) by mouth daily for 7 days     cefdinir 300 MG capsule  Commonly known as: OMNICEF  Take 1 capsule (300 mg) by mouth every 12 (twelve) hours for 5 days            CHANGE how you take these medications      polyethylene  glycol 17 g packet  Commonly known as: MIRALAX  Take 17 g by mouth daily  What changed: when to take this            CONTINUE taking these medications      acetaminophen 325 MG tablet  Commonly known as: TYLENOL     albuterol sulfate HFA 108 (90 Base) MCG/ACT inhaler  Commonly known as: PROVENTIL     aspirin EC 81 MG EC tablet     calcium carbonate 500 MG chewable tablet  Commonly known as: TUMS     candesartan 4 MG tablet  Commonly known as: ATACAND     Centrum Silver 50+Women Tabs     ergocalciferol 1.25 MG (50000 UT) capsule  Commonly known as: ERGOCALCIFEROL     fexofenadine 180 MG tablet  Commonly known as: ALLEGRA     Flonase Sensimist 27.5 MCG/SPRAY nasal spray  Generic drug: fluticasone     * lidocaine 5 %  Commonly known as: LIDODERM  Place 1 patch onto the skin every 24 hours Remove & Discard patch within 12 hours or as directed by MD     * lidocaine 5 %  Commonly known as: LIDODERM     lovastatin 40 MG tablet  Commonly known as: MEVACOR     meclizine 12.5 MG tablet  Commonly known as: ANTIVERT  Take 2 tablets (25 mg) by mouth 3 (three) times daily as needed for Dizziness     midodrine 5 MG tablet  Commonly known as: PROAMATINE  Take 1 tablet (5 mg) by mouth 3 (three) times daily As needed if SBP < 100 mm of HG     * ondansetron 4 MG disintegrating tablet  Commonly known as: ZOFRAN-ODT  Take 1 tablet (4 mg) by mouth every 6 (six) hours as needed for Nausea     * ondansetron 4 MG disintegrating tablet  Commonly known as: ZOFRAN-ODT  Take 1 tablet (4 mg) by mouth every 6 (six) hours as needed for Nausea      pantoprazole 40 MG tablet  Commonly known as: PROTONIX     ranolazine 500 MG 12 hr tablet  Commonly known as: RANEXA     sucralfate 1 g tablet  Commonly known as: CARAFATE  Take 1 tablet (1 g) by mouth every 6 (six) hours as needed (abdominal pain)     traMADol 50 MG tablet  Commonly known as: ULTRAM     venlafaxine 37.5 MG 24 hr capsule  Commonly known as: EFFEXOR-XR           * This list has 4 medication(s) that are the same as other medications prescribed for you. Read the directions carefully, and ask your doctor or other care provider to review them with you.                   Where to Get Your Medications        You can get these medications from any pharmacy    Bring a paper prescription for each of these medications  azithromycin 500 MG tablet  cefdinir 300 MG capsule         (FYI: you must refresh the link after final D/C Med reconciliation)    Discharge Instructions:   Disposition: home with family  Discharge Diet: Regular Diet        Patient was instructed to follow up with:   Primary Care Doctor Alinda Dooms, NP in  one week  & the following Consultants:  Complete instructions and follow up are in the patient's After Visit Summary    Minutes spent coordinating discharge and reviewing discharge plan: 45 minutes      Signed by: Theresia Lo, MD, MD    CC: Alinda Dooms, NP

## 2023-07-18 NOTE — Discharge Instr - AVS First Page (Addendum)
 Reason for your Hospital Admission:   Klebsiella UTI       Instructions for after your discharge:     Take your antibiotic as direction   Follow up with your PCP

## 2023-07-18 NOTE — Progress Notes (Signed)
 Trio rounding template   07/18/2023    - Brief  background -NYAJA DUBUQUE is a 83 y.o. female with AMS and sepsis 2/2 UTI.  -Last 24 Hrs (any critical events ,abnormal VS, treatment, trend ): N/A  -Respiratory (changes in oxygenation): RA; PRN Tessalon  capsule 100 mg given 1x on night shift.  -Cardiac Rhythm - if changes-  N/A  - Weight trend if applicable - N/A  -GI/GU: Voids via external catheter at night; last BM on chart is 3/16.  -Pain: Denies pain  -Nutrition (diabetic, NPO): CHO diet  -Activity / Safety/ mobility  questions (PMP, need for PT/ OT , sitter) : N/A  -Upcoming Procedures :  N/A  -Discharge Date/ Plans:  Expected discharge today , 3/18  -Questions/ orders needed  Discharge orders, possible patient transport orders d/t patient returning to [facility].    Note : Night shift to complete prior to Trio rounding   Mylinda Garrison, CHARITY FUNDRAISER

## 2023-07-18 NOTE — Consults (Signed)
 [facility]     Patient Name: Vanessa Kelly, Vanessa Kelly     MRN: [id number]      CSN: [id number]         Account Information     Hosp Acct #   [id number] Patient Class   Inpatient Service  Medicine Accommodation Code  Intermediate Care      Admission Information     Admitting Physician:  Attending Physician: Niki Rueben RAMAN, MD  Lovina Estelle, MD Unit  [facility] INTERMED* L&D Status      Admitting Diagnosis: Wheezing; Altered mental status; Acute cystitis without hematuria; Acute c* Room / Bed  MI621/MI621-02 L&D - Last Menstrual Cycle      Chief Complaint: Slurred Speech; Extremit*     Admit Type:  Admit Date/Time:  Discharge Date/Time: Emergency  07/15/2023 / 1241   /  Length of Stay: 3 Days    L&D EDD   Estimated Date of Delivery: None noted.      Patient Information              Home Address: [address]  [place] [address] Employer:  Employer Address:       ,     Main Phone: [phone] Employer Phone:     SSN: [id number]       DOB: 02-24-1941 (83 yrs)       Sex: Female Primary Care Physician: Aneita Dorothyann FORBES DEWAINE   Marital Status: Widowed Referring Physician:       No ref. provider found   Race: Black or African American       Ethnicity: Not of Hispanic/Latino/S*       Emergency Contacts  Name Home Phone Work Phone Mobile Phone Relationship Tamsen Rueben VICCI BERNICE [phone]   [phone] Daughter No   [facility]       Daughter           Guarantor Information     Guarantor Name: KEIRAN, SIAS ID: [id number]   Guarantor Relationship to Pt: Self Guarantor Type: Personal/Family   Guarantor DOB:    April 09, 1941 Billing Indicator: Nym Unable to Code      Guarantor Address: [address]   [place], [place] [address]          Guarantor Home Phone: [phone] Guarantor Employer:        Guarantor Work Phone:   Pensions Consultant Emp Phone:                     [employer] Name: [employer] Subscriber  Name: ANNISTEN, MANCHESTER   Insurance Address:    [address]  [place], [place]  [address] Subscriber DOB: May 11, 1940      Subscriber ID: [id number]   Insurance Phone:   Pt Relationship to Sub:   Self   Insurance ID:   Coverage Eff From: 05/03/2003   Group Name:   Preauthorization #: npr   Group #:   Preauthorization Days:        Secondary Insurance     Insurance Name: [employer] Subscriber Name: Vanessa Kelly   Insurance Address:    [address]  [place], [place]  [address] Subscriber DOB: [birth year]      Subscriber ID: [id number]   Insurance Phone: [phone] Pt Relationship to Sub:   Self   Insurance ID:   Coverage Eff From: 10/02/2020   Group Name:   Preauthorization #:  Group #:   Preauthorization Days:        [employer] Name: - Subscriber Name:     Community Education Officer Address:       ,   Statistician DOB:        Subscriber ID:     Press Photographer:   Pt Relationship to Sub:       Insurance ID:   Coverage Eff From:     Group Name:   Preauthorization #:     Group #:   Preauthorization Days:           07/18/2023 12:58 PM          Home Health face-to-face (FTF) Encounter (Order [id number])  Consult  Date: 07/18/2023 Department: [facility] Intermediate Care Ordering/Authorizing: Lovina Estelle, MD     Order Information    Order Date/Time Release Date/Time Start Date/Time End Date/Time   07/18/23 12:58 PM None 07/18/23 12:56 PM 07/18/23 12:56 PM     Order Details    Frequency Duration Priority Order Class   Once 1  occurrence Routine Hospital Performed     Standing Order Information    Remaining Occurrences Interval Last Released     0/1 Once 07/18/2023              Provider Information    Ordering User Ordering Provider Authorizing Provider   Ericson Nafziger, Yates, RN Tajik, Estelle, MD Lovina Estelle, MD   Attending Provider(s) Admitting Provider PCP   Arthor Bernardino LABOR, MD; Niki Rueben RAMAN, MD; Kennie Deatrice VEAR ROSALEA; Lovina Estelle, MD Niki Rueben RAMAN, MD Aneita Dorothyann BRAVO, NP      Verbal Order Info    Action Created on Order Mode Entered by Responsible Provider Signed by Signed on   Ordering 07/18/23 1258 Telephone with doyal Truett Yates, RN Lovina Estelle, MD             Comments    Home nursing required for skilled assessment including cardiopulmonary assessment and dietary education for disease management, post-op management, instruct post-op complications, and medication instruction.  Home PT/OT required for gait and balance training, strengthening, mobility, fall prevention, and ADL training.    Provider to follow: Aneita Dorothyann BRAVO, NP -- [phone]    Dx: Wheezing [R06.2]  Altered mental status [R41.82]  Acute cystitis without hematuria [N30.00]  Acute cough [R05.1]                Home Health face-to-face (FTF) Encounter: Patient Communication     Not Released  Not seen         Order Questions    Question Answer   Date I saw the patient face-to-face: 07/18/2023   Evidence this patient is homebound because: E.  Requires assistance of another person or assistive device to ambulate > 5 feet    I.  Restricted to home to decrease risk of infection    L.  Transfer/ambulation requires stand by assist and verbal cues to perform safely   Medical conditions that necessitate Home Health care: C.  Risk for complication/infection/pain requiring follow up and monitoring    D.  Chronic illness & risk for re-hospitalization due to unstable disease status    E.  Exacerbation of disease requiring follow up monitoring    G.  High risk/complex medications requiring instruction and management    H.  Multiple new medications requiring management and monitoring    F.  New diagnosis & treatment requiring follow up monitoring and management   Per  clinical findings, following services are medically necessary: Skilled Nursing    PT    OT   Clinical findings that support the need for Skilled Nursing. SN will: C. Monitor for signs and symptoms of exacerbation of disease and management    D. Review  medication reconciliation, manage and educate on use and side effects    G. Educate on new diagnosis, treatment & management to prevent re-hospitalization    I.  Educate dietary and or fluid restrictions and weight management    H. Assess cardiopulmonary status and monitor for signs &symptoms of exacerbation   Clinical findings that support the need for Physical Therapy. PT will A.  Evaluate and treat functional impairment and improve mobility    D.  Provide services to help restore function, mobility, and releive pain    E.  Educate on functional mobility; bed, chair, sit, stand and transfer activities    C.  Educate on weight bearing status, stair/gait training, balance & coordination    F.  Perform home safety assessment & develop safe in home exercise program    G.  Implement activities to improve stance time, cadence & step length    H.  Educate on the safe use of assistive device/ durable medical equipment    I.  Instruct on restorative activities to restore ability to perform ADL   OT will provide assistance with: Home program to improve ability to perform ADLs    Recovery and maintenance skills    Basic motor function and reasoning abilities    Strategies to compensate for loss of function    Restorative program to improve mobility and independence   Other (please specify) See comments                    Process Instructions    Please select Home Care Services medically necessary.    Based on the above findings, I certify that this patient is confined to the home and needs intermittent skilled nursing care, physical therapry and / or speech therapy or continues to need occupational therapy. The patient is under my care, and I have initiated the establishment of the plan of care. This patient will be followed by a physician who will periodically review the plan of care.     Collection Information            Consult Order Info    ID Description Priority Start Date Start Time   [id number] Home Health  face-to-face (FTF) Encounter Routine 07/18/2023 12:56 PM   Provider Specialty Referred to   ______________________________________ _____________________________________                         Verbal Order Info    Action Created on Order Mode Entered by Responsible Provider Signed by Signed on   Ordering 07/18/23 1258 Telephone with doyal Truett Morita, RN Lovina Estelle, MD             Patient Information    Patient Name  Vanessa Kelly Legal Sex  Female DOB  1940-11-23       Reprint Order Requisition    Home Health face-to-face (FTF) Encounter (Order #[id number]) on 07/18/23       Additional Information    Associated Reports External References   Priority and Order Details [employer]

## 2023-07-18 NOTE — OT Eval Note (Signed)
 Occupational Therapy Evaluation  Vanessa Kelly        Post Acute Care Therapy Recommendations:     Discharge Recommendations:  Home with supervision, Home with home health PT      DME needs IF patient is discharging home: No additional equipment/DME recommended at this time    Therapy discharge recommendations may change with patient status.  Please refer to most recent note for up-to-date recommendations.         [facility]  [address]  [place], [place] [address]  [phone]    Occupational Therapy Evaluation    Patient: Vanessa Kelly MRN: [id number]   Unit: [facility] INTERMEDIATE CARE Bed: MI621/MI621-02    Time of treatment:   OT Received On: 07/18/23  Start Time: 1010  Stop Time: 1109  Time Calculation (min): 59 min       Consult received for Vanessa Kelly for OT evaluation and treatment.  Patient's medical condition is appropriate for Occupational Therapy  intervention at this time.    Interpreter utilized: no, not indicated    Assessment     Vanessa Kelly is a 83 y.o. female admitted 07/15/2023. with acute AMS and dry cough; suspected sepsis from UTI.   At baseline, pt lives in ALF and ambulates with a rollator to the dining hall for meals.  Today  pt received in chair, agreeable to participate in OT, requires supv/RW to move sit to stand with cues for hand placement, performs toilet transfer with supv using grab bars, Indep peri hygiene, supv grooming in stance, supv for sponge bathing, supv for UB/LB dressing while seated. Pt left in chair, needs at reach. Pt near/at baseline in terms of ADL abilities. Will d/c OT services. Recommend d/c home with supv, HHPT.      Brief chart review completed including review of labs, review of imaging, and review of vitals.  Pt's ability to complete ADLs and functional transfers is impaired due to the following deficits: decreased activity tolerance, decreased balance, gait impairment, and decreased safety awareness.  Pt demonstrates  performance deficits with grooming, bathing, dressing, toileting, and functional mobility. There are a few comorbidities or other factors that affect plan of care and require modification of task including: assistive device needed for mobility. Standardized tests and exams incorporated into evaluation include AM-PAC Daily Activity, balance, coordination, vision, mobility, ROM/strength, and cognition/orientation. Pt would continue to benefit from OT to address these deficits and increase functional independence.        Complexity Chart Review Performance Deficits Clinical Decision Making Hx/Comorbidities Assistance needed   Low  Brief 1-3 Limited options None None (or at baseline)       PMP - Progressive Mobility Protocol   PMP Activity: Step 3 - Bed Mobility  Distance Walked (ft) (Step 6,7): 20 Feet     Rehabilitation Potential: good    Interdisciplinary Communication: RN, PT    Plan     OT Plan  Risks/Benefits/POC Discussed with Pt/Family: With patient  Treatment Interventions: ADL retraining;Functional transfer training;UE strengthening/ROM;Compensatory technique education;Equipment eval/education;Patient/Family training  Discharge Recommendation: Home with supervision;Home with home health PT  DME Recommended for Discharge: No additional equipment/DME recommended at this time  OT Frequency Recommended: therapy discontinued         Medical Diagnosis: Wheezing [R06.2]  Altered mental status [R41.82]  Acute cystitis without hematuria [N30.00]  Acute cough [R05.1]    History of Present Illness: Vanessa Kelly is a 83 y.o. female admitted on  07/15/2023 with  history of diabetes, hypertension, GERD, and coronary artery bypass, who presents with generalized fatigue, somnolence sleeping  throughout the day and more confused than usual according to the daughter who video chats with her daily from [place] . These symptoms occurred over 24-48 hours ago. The patient's daughter, who checks in daily via video call,  noticed a decline in the patient's condition, including shaking and being out of sorts.  She has a dry cough, which is new and severe enough to cause headaches. She also reports abdominal tenderness in the bilateral upper quadrants. The patient has a history of a gastrointestinal virus, which had a significant impact on her health for several weeks. She had recovered from this and was able to socialize and move around before the recent decline in her health per ED provider note 07/15/23.              Problem List[1]  Medical History[2]  Past Surgical History[3]    Tests/Labs:  Lab Results   Component Value Date/Time    HGB 9.0 (L) 07/18/2023 05:12 AM    HGB 10.4 (L) 06/28/2022 06:18 AM    HCT 27.0 (L) 07/18/2023 05:12 AM    HCT 31.8 (L) 06/28/2022 06:18 AM    K 4.7 07/18/2023 05:12 AM    K 4.5 06/28/2022 06:18 AM    NA 135 07/18/2023 05:12 AM    NA 140 06/28/2022 06:18 AM    INR 1.2 07/15/2023 01:04 PM    TROPI 13.9 07/15/2023 04:17 PM    TROPI 18.6 (H) 07/15/2023 01:04 PM    TROPI 13.0 12/05/2022 09:46 AM    TROPI 40.3 (H) 12/04/2022 01:34 PM    TROPI 6.7 06/23/2022 06:41 PM    TROPI <2.7 01/18/2022 05:15 PM    TROPI <0.01 01/21/2021 02:30 PM         Imaging:  CT Abd/Pelvis without Contrast    Result Date: 07/15/2023  1. No acute abnormal findings are identified. No explanation for acute symptoms. 2. Cholecystectomy. 3. Moderate hiatal hernia. 4. Hysterectomy. 5. Foreign body in the vaginal lumen of unclear significance. 6. Diverticulosis of the colon. Deward Holt, MD 07/15/2023 3:44 PM    XR Chest  AP Portable    Result Date: 07/15/2023   No acute disease. Toribio Hering, MD 07/15/2023 2:45 PM    CT Head WO Contrast    Result Date: 07/15/2023   1.No acute intracranial abnormality. 2.Mild chronic small vessel ischemic changes. 3.Air-fluid level in right maxillary sinus and large air-fluid level at the mastoid tip air cells on the right. Correlation with symptoms of acute on chronic sinusitis and mastoiditis is  recommended. Nelya Ebadirad 07/15/2023 1:52 PM      Social History: (Per family/care provider, if patient is not able to give accurate report)  Lives at [facility] in an apartment.  Entry Steps: 0             Inside steps: elevator access/use    Equipment at home:  rollator, walk in shower, grab bars in shower, shower chair, raised toilet seat, and grab bar around toilet area  Prior Level of Function:  light meal prep; eats all meals in dining hall (~40-50 ft to/from elevator to/from apartment and dining hall). Pt reports she attends activities in the community at times              Cognition: [facility]  Mobility/Locomotion: modI with rollator              Feeding: independent              Grooming: independent               Bathing: modI with shower chair              Dressing: independent               Toileting: independent; uses external catheter home system at night    Subjective   Thank you  Patient is agreeable to participation in the therapy session. Nursing clears patient for therapy.  Patient's Goal:  get well and go home  Pain: no evidence of pain noted during session      Objective     Precautions:   Precautions  Weight Bearing Status: no restrictions  Other Precautions: falls, Orthosatic hypotension    Patient is seated in a bedside chair with telemetry and intravenous access in place.       Observation of patient/vitals:   Vitals:    07/18/23 0051 07/18/23 0413 07/18/23 0734 07/18/23 0834   BP: 119/66 134/73 148/61    Pulse: 74 70 75    Resp: 18 16 18     Temp: 97.9 F (36.6 C) 98.4 F (36.9 C) 98.4 F (36.9 C)    TempSrc: Oral Oral Oral    SpO2: 100% 99% 99% 99%   Weight:       Height:           Orientation/Cognition:     Alert and Oriented x 4  Cognition: WFL      Musculoskeletal Examination:     ROM Strength   RUE WFL WFL   LUE WFL wFL         Sensation: Intact to light touch, denies numbness/tingling throughout BUE   Coordination: Intact gross motor and serial opposition  to B hands    Vision: Glasses  Hearing: WFL      Functional Mobility:  Sit to stand: supv/ RW cues for hand placement  Stand to sit: supv  Transfers: on/off chair, supv toilet  Mobility/Ambulation: supv/RW    Balance:  Static Sit Balance: good  Dynamic Sit Balance: good  Static Stand Balance: fair+  Dynamic Stand Balance: fair    Self Care:  Feeding: Independent  Grooming: Indep oral care in stance  Bathing: supv peri hygiene in stance  UB Dressing: supv don gown  LB Dressing: supv don briefs  Toileting: indep hyginene      Endurance: good with several sitting rest breaks during this session    Participation:  good    Education:  Educated the personal assistant to role of occupational therapy, plan of care, goals  of therapy, rationale for progressing mobility and safety with mobility and ADLs.    RN notified of session outcome and that patient was left in chair with all needs met and equipment intact.   Safety measures include: handoff to nurse/clin tech/ unit secretary completed, oriented to call bell and placed within reach, personal items within reach, and assistive device positioned out of reach.   Mobility and ADL status posted at bedside and within E.M.R.        AM-PACT 6 Clicks Daily Activity Inpatient Short Form  Inpatient AM-PACT Performed?: yes  Put On/Take Off Lower Body Clothing: 3  Assist with Bathing: 3  Assist with Toileting: 3  Put On/Take Off Upper Body Clothing: 3  Assist  with Grooming: 4  Assist with Eating: 4  OT Daily Activity Raw Score: 20  CMS 0-100% Score: 38.32%       Goals:  Goals  Goal Formulation: Patient  Time For Goal Achievement: 1 visit  Patient will groom self: Supervision;at sinkside;Goal met  Patient will dress upper body: Supervision;Goal met  Patient will dress lower body: Supervision;Goal met  Pt will complete bathing: Stand by Assist;Goal met  Patient will toilet: Supervision;Goal met  Pt will perform functional transfers: Supervision;with rolling walker;Goal met              Therapist PPE during session procedural mask and gloves      Signature:   Gennette Mcardle, OT  07/18/2023  11:27 AM             [1]   Patient Active Problem List  Diagnosis    Headache    Mastoiditis    Dizziness    Altered mental status   [2]   Past Medical History:  Diagnosis Date    Diabetes mellitus (CMS/HCC)     Gastroesophageal reflux disease     Hyperlipidemia     Hypertension     Hypotension     Seasonal allergic rhinitis     Syncope    [3]   Past Surgical History:  Procedure Laterality Date    BREAST BIOPSY Right     2014    CORONARY ARTERY BYPASS      HYSTERECTOMY      [phone]

## 2023-07-18 NOTE — Progress Notes (Addendum)
 INTERNAL MEDICINE PROGRESS NOTE  [facility], Division of Hospitalist Medicine  [facility]  [place] pager: [phone]      Date Time: 07/18/23 11:46 PM  Patient Name: Vanessa Kelly  Attending Physician: Lovina Estelle, MD    Assessment:   Principal Problem:    Altered mental status  Resolved Problems:    * No resolved hospital problems. *    83 y.o. female with a PMHx of GERD, diabetes, hypertension, hyperlipidemia who presented to the ED with altered mental status and was found to have sepsis secondary to UTI.     Today  is day # 3     Plan:     Acute toxic metabolic encephalopathy secondary to Sepsis, acute encephalopathy improving, patient slightly confused but alert and oriented during bedside encounter  Sepsis secondary to UTI   Acute cystitis   Patient presented with altered mental status, urinary retention for the past 3 days, UA positive for leukocyte esterase and WBCs seen on microscopy  Patient given IV ceftriaxone  in the ED along with 30 mL/kg fluid as per sepsis protocol  CT brain negative except for air-fluid level in right maxillary sinus and large air-fluid level at mastoid tip, correlate with symptoms of sinusitis/mastoiditis, no other symptoms reported by patient aside from confusion, improving with antibiotics  UA positive  CT abdomen pelvis without acute abnormality  Waxing and waning confusion  WBC is normalized .  Continue ceftriaxone , follow-up urine culture: >100,000 CFU/mL Klebsiella pneumoniae Abnormal        Elevated troponin 18.6 repeat 13,9 within normal limits   Echo done in August showed EF 65 to 70%, no history of heart failure  Likely secondary to demand  Telemetry monitoring    Hypertensive urgency  Hypertension  History of orthostatic hypotension  Blood pressure elevated 220 systolic by 90s diastolic  Hold valsartan  in view of AKI, consider resuming home medication once renal function stabilizes  Start amlodipine  5 mg daily  Hydralazine  as needed for systolic  blood pressure greater than 180    Fever     Fever 101.1 negative for Covid and flu ., will  monitor tem    She reports cough today  , denied any sob or chest pain , she wants to get CXR as she has similar  symptom last year when she had pneumonia   - on exam : she has crackles on left side   - will obtain CXR and check procalcitonic   - add Azithromycin      Macrocytic anemia, chronic, PoA  Monitor hemoglobin    AKI likely secondary to urinary retention  Urinary retention resolved  Presented with creatinine of 1.6, baseline creatinine appears to be 1.3-1.4  IV fluids- can be stop today    Monitor renal function  Avoid nephrotoxic agents  Hold valsartan   Monitor with bladder scan and insert Foley catheter if patient is retaining urine  Vaginal foreign body seen on CT abdomen pelvis was a foley catheter, removed at bedside by ED provider       Hyponatremia   Sodium 130,  Na improved to 134   Monitor sodium levels    Diabetes. Hg A1C 5.8   Sliding scale insulin   Monitor fingerstick glucose  Recent Labs     07/18/23  2112 07/18/23  1615 07/18/23  1133 07/18/23  0729   Whole Blood Glucose POCT 128* 172* 192* 132*               Deep vein thrombosis prophylaxis Medication  VTE Prophylaxis Orders: heparin  (porcine) injection 5,000 Units  Mechanical VTE Prophylaxis Orders: Maintain sequential compression device     Full Code  Supervise For Meals Frequency: All meals  Adult diet Therapeutic/ Modified; Solid; Regular (IDDSI level 7); Thin (IDDSI level 0); Consistent carbohydrate  Glucerna Shake (8 Oz Cans) Quantity: A. One; Flavor: Strawberry (or chocolate); Frequency: Daily with breakfast    Expect [place] date is Jul 18, 2023   DISPOSITION@        Case discussed with: .nurse  Family member : daughter on the phone   Consultant :   Subjective/24 hour events:     Chief Complaint   Patient presents with    Slurred Speech    Extremity Weakness    Altered Mental Status     Tele : NS                                                                                Last 24 h : no event     Today  : . LOS: 3 days  pt is alert and awake , denied any pain ,  reports cough for 2 days and wants to make sure she dose not have pneumonia . She has no urinary sym , No fever       Medications:   aspirin  EC, 81 mg, Daily  atorvastatin , 10 mg, Daily  azithromycin , 500 mg, Q24H  cefTRIAXone , 1 g, Daily at 1700  fluticasone , 2 spray, QHS  heparin  (porcine), 5,000 Units, Q8H [facility]  insulin  lispro, 1-3 Units, QHS  insulin  lispro, 1-5 Units, TID AC  lidocaine , 1 patch, Q24H  pantoprazole , 40 mg, Daily at 0600  ranolazine , 500 mg, Q12H SCH  senna-docusate, 2 tablet, QHS  sodium chloride , 30 mL/kg, Once  venlafaxine , 75 mg, QHS  vitamin D  (ergocalciferol ), 50,000 Units, Weekly      PRN Medications[1]  Physical exam:   Temp:  [97.9 F (36.6 C)-98.8 F (37.1 C)] 98.8 F (37.1 C)  Heart Rate:  [70-104] 74  Resp Rate:  [16-21] 21  BP: (106-166)/(61-82) 166/78  No intake or output data in the 24 hours ending 07/18/23 2346    General: Awake, alert, oriented, no apparent distress.  Cardiovascular: Regular rate and rhythm. No murmurs, gallops or rubs noted.  Lungs: Clear to auscultation bilaterally. No wheezing, crackles or rhonchi noted.  Abdomen: Soft, non-tender, non-distended. No organomegaly or masses noted. Normal bowel sounds. No guarding or rebound tenderness noted.  Extremities: No edema noted. 2+ pulses throughout.  Neuro: Non-focal neurological exam.              03/16/2023 06/21/2023 07/15/2023   Weight Monitoring   Height 175.3 cm  175.3 cm   Height Method   Stated   Weight 84.823 kg 86.9 kg    -- 85.7 kg   Weight Method  Bed Scale Bed Scale   BMI (calculated) 27.6 kg/m2  27.9 kg/m2       Multiple values from one day are sorted in reverse-chronological order            Labs (last 72 hours):     Recent Labs   Lab 07/18/23  0512 07/17/23  0438  WBC 7.13 6.56   Hemoglobin 9.0* 8.8*   Hematocrit 27.0* 26.3*   Platelet Count 209 178     Recent Labs   Lab 07/18/23  0512  07/17/23  0438   Sodium 135 134*   Potassium 4.7 4.8   Chloride 104 103   CO2 23 24   BUN 22* 26*   Creatinine 1.3* 1.4*   Calcium  9.9 9.2   Albumin 2.4* 2.2*   Protein, Total 6.1 5.6*   Bilirubin, Total 0.3 0.3   Alkaline Phosphatase 70 60   ALT 21 17   AST (SGOT) 29 25   Glucose 128* 128*     Recent Labs   Lab 07/15/23  1304   PT 13.8*   INR 1.2   PTT 29           Radiology:     Radiology Results (24 Hour)       Procedure Component Value Units Date/Time    XR Chest 2 Views [[id number]] Collected: 07/18/23 1528    Order Status: Completed Updated: 07/18/23 1532    Narrative:      HISTORY: Cough    COMPARISON: 07/15/2023    FINDINGS: PA and lateral views of the chest were obtained. Median  sternotomy  The heart is mildly enlarged in size.   The  mediastinum and hilar structures are unremarkable.   The lung fields demonstrates mild increased perihilar interstitial alveolar  densities.   There is a chronic small left effusion.  No pneumothorax.  The visualized osseous structures demonstrate no acute abnormality. Right  shoulder arthroplasty. Surgical clips in the right upper abdomen      Impression:         1. Increased perihilar densities could represent pulmonary vascular  congestion or atypical infection.  2. Chronic left effusion and adjacent subsegmental atelectasis.  3. Stable cardiomegaly    Barnie Lamp, MD  07/18/2023 3:30 PM            Signed by: Lovina Estelle, MD  Date/time: 07/18/23 11:46 PM        [1]   Current Facility-Administered Medications   Medication Dose Route    acetaminophen   650 mg Oral    albuterol   2.5 mg Nebulization    benzocaine -menthol   1 lozenge Buccal    benzonatate   100 mg Oral    calcium  carbonate  1 tablet Oral    carboxymethylcellulose sodium  1 drop Both Eyes    dextrose   15 g of glucose Oral    Or    dextrose   12.5 g Intravenous    Or    dextrose   12.5 g Intravenous    Or    glucagon  (rDNA)  1 mg Intramuscular    guaiFENesin -dextromethorphan   5 mL Oral    hydrALAZINE   10 mg  Intravenous    melatonin  3 mg Oral    naloxone   0.2 mg Intravenous    ondansetron   4 mg Oral    Or    ondansetron   4 mg Intravenous    polyethylene glycol  17 g Oral    saline  2 spray Each Nare    sucralfate   1 g Oral

## 2023-07-18 NOTE — Progress Notes (Signed)
 Nutrition Support Services  [facility]   Main Office Telephone: [phone]    Nutrition Assessment    Vanessa Kelly 83 y.o. female   MRN: [id number]    Summary of Nutrition Recommendations:  Continue diet order: consistent carbohydrate  Check weight and document oral intakes    Add Glucerna strawberry or vanilla once daily to optimize oral intakes  Each Glucerna shake provides 220 kcal and 10 gm protein.   4. Monitor GI symptoms    Assessment Data   Reason for Assessment: MST=2 (unsure wt loss)    Subjective Nutrition:  Pt was concerned about her blood sugars and her diet order. Answered questions and pt endorsed desire to have ONS to optimize oral intakes. Reports 5 day PTA of very little intakes and consistent diarrhea. Denies any diarrhea since admission and/or appetite concerns. Pt unsure if she has lost weight due to acuity of illness PTA. NFPE did find significant deficits but difficult to ascertain if age related vs malnutrition at this time. Will monitor for changes. Pt will be screened at moderate nutrition risk at this time.     Learning Needs:  reviewed diet order and things that may interact with BS levels. Reviewed ONS.    Events of Current Admission: 83 y.o. female with a PMHx of GERD, diabetes, hypertension, hyperlipidemia who presented to the ED with altered mental status and was found to have sepsis secondary to UTI.     Medical Hx:  has a past medical history of Diabetes mellitus (CMS/HCC), Gastroesophageal reflux disease, Hyperlipidemia, Hypertension, Hypotension, Seasonal allergic rhinitis, and Syncope.    PSH: has a past surgical history that includes Breast biopsy (Right); Hysterectomy; and CORONARY ARTERY BYPASS.       Weight Hx Summary:    Weight Monitoring     Weight Weight Method   06/23/2022 87.9 kg  Bed Scale     84.8 kg  Bed Scale     84.8 kg  Bed Scale    12/04/2022 92.4 kg  Bed Scale     87.4 kg  Bed Scale    03/16/2023 84.823 kg     06/21/2023 --  Bed Scale      86.9 kg     07/15/2023 85.7 kg  Bed Scale      Wt fluctuations noted, may be due to fluid shifts, insignificant wt loss since last available weight.      ANTHROPOMETRIC  Height: 175.3 cm (5' 9)  Weight: 85.7 kg (188 lb 15 oz)                    Body mass index is 27.9 kg/m.       Active Hospital Problems    Diagnosis    Altered mental status     Allergies[1]    Orders Placed This Encounter   Procedures    Adult diet Therapeutic/ Modified; Solid; Regular (IDDSI level 7); Thin (IDDSI level 0); Consistent carbohydrate        Current Meds:  albuterol -ipratropium, 3 mL, BID  amLODIPine , 5 mg, Daily  aspirin  EC, 81 mg, Daily  atorvastatin , 10 mg, Daily  cefTRIAXone , 1 g, Daily at 1700  fluticasone , 2 spray, QHS  heparin  (porcine), 5,000 Units, Q8H SCH  insulin  lispro, 1-3 Units, QHS  insulin  lispro, 1-5 Units, TID AC  lidocaine , 1 patch, Q24H  pantoprazole , 40 mg, Daily at 0600  ranolazine , 500 mg, Q12H SCH  senna-docusate, 2 tablet, QHS  sodium chloride , 30 mL/kg, Once  venlafaxine , 75 mg, QHS  vitamin D  (ergocalciferol ), 50,000 Units, Weekly      2 units insulin  lispro    Recent Labs:  Recent Labs   Lab 07/18/23  0512 07/17/23  0438 07/16/23  0256   WBC 7.13 6.56 8.80   Hemoglobin 9.0* 8.8* 10.4*   Hematocrit 27.0* 26.3* 31.7*   MCV 97.1* 98.9* 102.6*   Platelet Count 209 178 149     Recent Labs   Lab 07/18/23  0512 07/17/23  0438 07/16/23  0256 07/15/23  1304   Sodium 135 134* 131* 130*   Potassium 4.7 4.8 4.7  4.8 4.5   Chloride 104 103 101 100   CO2 23 24 20 23    BUN 22* 26* 27* 23*   Creatinine 1.3* 1.4* 1.5* 1.6*   Glucose 128* 128* 113* 195*   Calcium  9.9 9.2 9.8 9.6   Magnesium  1.8 1.7 1.7  --    Phosphorus 2.9 2.6  --   --    GFR 39.8* 36.4* 33.5* 31.0*     Recent Labs   Lab 07/18/23  0512 07/17/23  0438 07/16/23  0256   Albumin 2.4* 2.2* 2.7*      Latest Reference Range & Units Most Recent 07/18/23 07:29   Whole Blood Glucose POCT 70 - 100 mg/dL 867 (H)  6/81/74 92:70 132 (H)   (H): Data is abnormally  high     Latest Reference Range & Units Most Recent   Hemoglobin A1C 4.6 - 5.6 % 5.8 (H)  06/07/23 10:02   (H): Data is abnormally high  Estimated Nutrition Needs:  Total Daily Energy Needs: 1971.1 to 2313.9 kcal  Method for Calculating Energy Needs: 23 kcal - 27 kcal per kg  at 85.7 kg (Actual body weight)  Rationale: BMI, DM2, CHF, non critical     Total Daily Protein Needs: 95.984 to 111.41 g  Method for Calculating Protein Needs: 1.12 g - 1.3 g per kg at 85.7 kg (Actual body weight)  Rationale: BMI, DM2, non critical    Total Daily Fluid Needs: 1971.1 to 2313.9 ml  Method for Calculating Fluid Needs: 1 ml per kcal energy = 1971.1 to 2313.9 kcal  Rationale: BMI, age or per MD orders      GI Symptoms: Last BM Date: 07/13/23  Skin: none noted  NFPE: 3/18, losses may be age related, will defer use in malnutrition criteria at this time  Head: temple region: slight depression with decrease in muscle tone/resistance (moderate muscle loss - temporalis), orbital region: slightly dark circles, somewhat hollow look, some decrease in bounce back of fat pads (moderate fat loss), and buccal region: slight depression, somewhat sunken appearance, flat cheeks, decrease in bounce back of fat pads (moderate fat loss)  Upper Body: clavicle bone region: clavicle may be visible, but not prominent, feel muscle tone/resistance (no wasting observed), shoulder and Acromion bone region: rounded, curves at shoulder/arm juncture, feel muscle tone/resistance (no wasting observed), upper arm region: ample fat tissue between fingers (no wasting observed), dorsal hand region: slight depression, decrease in muscle tone/resistance (mild muscle loss - interosseous), scapular bone region: bones not prominent, no significant depressions, feel muscle tone/resistance (no wasting observed), and thoracic/lumbar region: chest is full, ribs do not show, ample fat in pinch, slight to no protrusion of Iliac Crest. (no wasting observed)  Lower Body: no s/s  subcutaneous fat or muscle loss    Nutrition Diagnosis      Altered nutrient related lab values related to endocrine dysfunction as  evidenced by HbA1c of 5.8.    Inadequate oral intakes related to altered GI functioning as evidenced by <50% estimated needs met ~5 days prior to admission.     Intervention      Please refer to top of note for nutrition interventions and recommendations     Monitoring and Evaluation      Goals:   Pt to meet >= 75% of estimated needs via meals/ONS  during admission.   BS to be within range consistent with GOC (140-180 mg/dl)    Monitor: GI, BS levels, diet tolerance, ONS tolerance  Follow up: 5-7 days            Jon Saturnino Mages, MPH, RDN  Clinical Registered Dietitian      Clinical Nutrition Office: [phone]          [1]   Allergies  Allergen Reactions    Fentanyl Anaphylaxis     Hives    Percolone [Oxycodone]

## 2023-07-18 NOTE — Plan of Care (Signed)
 Problem: Moderate/High Fall Risk Score >5  Goal: Patient will remain free of falls  Flowsheets (Taken 07/16/2023 2200 by Jadene Kaufmann, RN)  Moderate Risk (6-13):   MOD-Apply bed exit alarm if patient is confused   MOD-Re-orient confused patients     Problem: Diabetes: Glucose Imbalance  Goal: Blood glucose stable at established goal  07/18/2023 1828 by Gerardo Backers, RN  Outcome: Progressing  07/18/2023 0831 by Gerardo Backers, RN  Flowsheets (Taken 07/16/2023 2300 by Jadene Kaufmann, RN)  Blood glucose stable at established goal:   Monitor lab values   Monitor intake and output.  Notify LIP if urine output is < 30 mL/hour.   Include patient/family in decisions related to nutrition/dietary selections   Monitor/assess vital signs   Assess for hypoglycemia /hyperglycemia   Ensure adequate hydration   Ensure appropriate diet and assess tolerance   Coordinate medication administration with meals, as indicated     Problem: Potential for Aspiration  Goal: Risk of aspiration will be minimized  Flowsheets (Taken 07/17/2023 1052 by Roberto Elenor Norris, RN)  Risk of aspiration will be minimized:   Assess/monitor ability to swallow using dysphagia screen: Keep patient NPO if patient fails screening   Place swallow precaution signage above bed and supervise patient during oral intake   Monitor/assess for signs of aspiration (tachypnea, cough, wheezing, clearing throat, hoarseness after eating, decrease in SaO2)   Consult/collaborate/follow recommended modified texture diet/thicken liquids as indicated by Speech Pathologist   Place patient up in chair to eat, if possible/head of bed up 90 degrees to eat if unable to be out of bed   Instruct patient to take small bites, small single sips of liquid, and do not use a straw     Problem: Safety  Goal: Patient will be free from injury during hospitalization  07/18/2023 1828 by Gerardo Backers, RN  Flowsheets (Taken 07/16/2023 2300 by Jadene Kaufmann, RN)  Patient will be free from injury  during hospitalization:   Hourly rounding   Assess patient's risk for falls and implement fall prevention plan of care per policy   Use appropriate transfer methods   Ensure appropriate safety devices are available at the bedside   Provide and maintain safe environment  07/18/2023 0831 by Gerardo, Nil Xiong, RN  Flowsheets (Taken 07/16/2023 2300 by Jadene Kaufmann, RN)  Patient will be free from injury during hospitalization:   Hourly rounding   Assess patient's risk for falls and implement fall prevention plan of care per policy   Use appropriate transfer methods   Ensure appropriate safety devices are available at the bedside   Provide and maintain safe environment   Orientation: Patient is alert and oriented X 4.   Activity: X 1 assist    Pain:  Denies  Significant Events: Pt discharged but had to go for X-ray and 2 does of antibiotics to be administered.  Patient's  IV got infiltrated after administering 1 does of antibiotic.  Ultrasound guided IV placed .  Provider notified , say it is alright for patient to stay over night.

## 2023-07-18 NOTE — Progress Notes (Signed)
 1255 - P[facility] order placed for HH PT, OT. [facility] to coordinate with ALF therapy staff. [facility] liaison notified by secure chat.     Valetta Close, BSN RN  Case Manager  [facility]

## 2023-07-18 NOTE — Plan of Care (Signed)
 Problem: Moderate/High Fall Risk Score >5  Goal: Patient will remain free of falls  Outcome: Progressing     Problem: Compromised Moisture  Goal: Moisture level Interventions  Outcome: Progressing     Problem: Fluid and Electrolyte Imbalance/ Endocrine  Goal: Fluid and electrolyte balance are achieved/maintained  Outcome: Progressing  Goal: Adequate hydration  Outcome: Progressing     Problem: Nutrition  Goal: Nutritional intake is adequate  Outcome: Progressing     Problem: Diabetes: Glucose Imbalance  Goal: Blood glucose stable at established goal  Outcome: Progressing     Problem: Neurological Deficit  Goal: Neurological status is stable or improving  Outcome: Progressing     Problem: Potential for Aspiration  Goal: Risk of aspiration will be minimized  Outcome: Progressing     Problem: Pain interferes with ability to perform ADL  Goal: Pain at adequate level as identified by patient  Outcome: Progressing     Problem: Compromised Activity/Mobility  Goal: Activity/Mobility Interventions  Outcome: Progressing     Problem: Safety  Goal: Patient will be free from injury during hospitalization  Outcome: Progressing  Goal: Patient will be free from infection during hospitalization  Outcome: Progressing

## 2023-07-19 LAB — WHOLE BLOOD GLUCOSE POCT
Whole Blood Glucose POCT: 121 mg/dL — ABNORMAL HIGH (ref 70–100)
Whole Blood Glucose POCT: 186 mg/dL — ABNORMAL HIGH (ref 70–100)

## 2023-07-19 LAB — LAB USE ONLY - MORPHOLOGY REVIEW
Platelet Estimate: NORMAL
RBC Morphology: ABNORMAL — AB

## 2023-07-19 LAB — COMPREHENSIVE METABOLIC PANEL
ALT: 21 U/L (ref <=55)
AST (SGOT): 27 U/L (ref <=41)
Albumin/Globulin Ratio: 0.6 — ABNORMAL LOW (ref 0.9–2.2)
Albumin: 2.4 g/dL — ABNORMAL LOW (ref 3.5–5.0)
Alkaline Phosphatase: 68 U/L (ref 37–117)
Anion Gap: 7 (ref 5.0–15.0)
BUN: 22 mg/dL — ABNORMAL HIGH (ref 7–21)
Bilirubin, Total: 0.3 mg/dL (ref 0.2–1.2)
CO2: 25 meq/L (ref 17–29)
Calcium: 10.2 mg/dL (ref 7.9–10.2)
Chloride: 107 meq/L (ref 99–111)
Creatinine: 1.2 mg/dL — ABNORMAL HIGH (ref 0.4–1.0)
GFR: 43.8 mL/min/{1.73_m2} — ABNORMAL LOW (ref >=60.0)
Globulin: 3.7 g/dL — ABNORMAL HIGH (ref 2.0–3.6)
Glucose: 120 mg/dL — ABNORMAL HIGH (ref 70–100)
Potassium: 4.9 meq/L (ref 3.5–5.3)
Protein, Total: 6.1 g/dL (ref 6.0–8.3)
Sodium: 139 meq/L (ref 135–145)

## 2023-07-19 LAB — LAB USE ONLY - CBC WITH DIFFERENTIAL
Absolute Basophils: 0.06 10*3/uL (ref 0.00–0.08)
Absolute Eosinophils: 0.38 10*3/uL (ref 0.00–0.44)
Absolute Immature Granulocytes: 0.04 10*3/uL (ref 0.00–0.07)
Absolute Lymphocytes: 2.11 10*3/uL (ref 0.42–3.22)
Absolute Monocytes: 1.1 10*3/uL — ABNORMAL HIGH (ref 0.21–0.85)
Absolute Neutrophils: 3.53 10*3/uL (ref 1.10–6.33)
Absolute nRBC: 0 10*3/uL (ref <=0.00)
Basophils %: 0.8 %
Eosinophils %: 5.3 %
Hematocrit: 26.6 % — ABNORMAL LOW (ref 34.7–43.7)
Hemoglobin: 9 g/dL — ABNORMAL LOW (ref 11.4–14.8)
Immature Granulocytes %: 0.6 %
Lymphocytes %: 29.2 %
MCH: 33.5 pg (ref 25.1–33.5)
MCHC: 33.8 g/dL (ref 31.5–35.8)
MCV: 98.9 fL — ABNORMAL HIGH (ref 78.0–96.0)
MPV: 10.7 fL (ref 8.9–12.5)
Monocytes %: 15.2 %
Neutrophils %: 48.9 %
Platelet Count: 215 10*3/uL (ref 142–346)
Preliminary Absolute Neutrophil Count: 3.53 10*3/uL (ref 1.10–6.33)
RBC: 2.69 10*6/uL — ABNORMAL LOW (ref 3.90–5.10)
RDW: 14 % (ref 11–15)
WBC: 7.22 10*3/uL (ref 3.10–9.50)
nRBC %: 0 /100{WBCs} (ref <=0.0)

## 2023-07-19 LAB — PROCALCITONIN: Procalcitonin: 0.45 ng/mL — ABNORMAL HIGH (ref 0.00–0.10)

## 2023-07-19 LAB — MAGNESIUM: Magnesium: 1.8 mg/dL (ref 1.6–2.6)

## 2023-07-19 LAB — PHOSPHORUS: Phosphorus: 3 mg/dL (ref 2.3–4.7)

## 2023-07-19 NOTE — Plan of Care (Signed)
 Problem: Moderate/High Fall Risk Score >5  Goal: Patient will remain free of falls  Outcome: Progressing  Flowsheets (Taken 07/18/2023 2000)  High (Greater than 13):   HIGH-Bed alarm on at all times while patient in bed   HIGH-Consider use of low bed     Problem: Compromised Sensory Perception  Goal: Sensory Perception Interventions  Outcome: Progressing  Flowsheets (Taken 07/18/2023 2020)  Sensory Perception Interventions: Offload heels, Pad bony prominences, Reposition q 2hrs/turn Clock, Q2 hour skin assessment under devices if present     Problem: Compromised Moisture  Goal: Moisture level Interventions  Outcome: Progressing  Flowsheets (Taken 07/18/2023 2020)  Moisture level Interventions: Moisture wicking products, Moisture barrier cream     Problem: Compromised Activity/Mobility  Goal: Activity/Mobility Interventions  Outcome: Progressing  Flowsheets (Taken 07/18/2023 2020)  Activity/Mobility Interventions: Pad bony prominences, TAP Seated positioning system when OOB, Promote PMP, Reposition q 2 hrs / turn clock, Offload heels     Problem: Compromised Nutrition  Goal: Nutrition Interventions  Outcome: Progressing  Flowsheets (Taken 07/18/2023 2020)  Nutrition Interventions: Discuss nutrition at Rounds, I&Os, Document % meal eaten, Daily weights     Problem: Compromised Friction/Shear  Goal: Friction and Shear Interventions  Outcome: Progressing  Flowsheets (Taken 07/18/2023 2020)  Friction and Shear Interventions: Pad bony prominences, Off load heels, HOB 30 degrees or less unless contraindicated, Consider: TAP seated positioning, Heel foams     Problem: Fluid and Electrolyte Imbalance/ Endocrine  Goal: Fluid and electrolyte balance are achieved/maintained  Outcome: Progressing  Flowsheets (Taken 07/19/2023 0416)  Fluid and electrolyte balance are achieved/maintained:   Monitor/assess lab values and report abnormal values   Assess and reassess fluid and electrolyte status  Goal: Adequate hydration  Outcome:  Progressing  Flowsheets (Taken 07/19/2023 0416)  Adequate hydration: Monitor and assess vital signs and perfusion     Problem: Fluid and Electrolyte Imbalance/ Endocrine  Goal: Adequate hydration  Outcome: Progressing  Flowsheets (Taken 07/19/2023 0416)  Adequate hydration: Monitor and assess vital signs and perfusion     Problem: Nutrition  Goal: Nutritional intake is adequate  Outcome: Progressing

## 2023-07-19 NOTE — Progress Notes (Signed)
 Report called to Fleet Contras at [facility] [place] patient and daughter also aware of plan. MMT will pick patient up at 1600

## 2023-07-19 NOTE — Progress Notes (Signed)
 Trio rounding template   07/19/2023    - Brief  background -Vanessa Kelly is a 83 y.o. female with PMHx of GERD, diabetes, hypertension, hyperlipidemia who presented to the ED with altered mental status and was found to have sepsis secondary to UTI.   (why Pt came to hospital, and why they are still here )   -Last 24 Hrs (any critical events ,abnormal VS, treatment, trend ): IV antibiotic therapy   -Respiratory (changes in oxygenation): RA - Non productive cough  -Cardiac Rhythm - if changes-  NSR  - Weight trend if applicable - trending  -GI/GU (changes in out put, BM, Foley): External Cath  -Pain: Denies pain  -Nutrition (diabetic, NPO): CHO  -Activity / Safety/ mobility  questions (PMP, need for PT/ OT , sitter) : PT/OT  -Upcoming Procedures :  None  -Discharge Date/ Plans:  To ALF   -Questions/ orders needed  None    Note : Night shift to complete prior to Trio rounding   Rajni Holsworth A Meighan Treto, RN

## 2023-07-19 NOTE — Progress Notes (Signed)
 07/19/23 1436   Discharge Disposition   Patient preference/choice provided? Yes   Physical Discharge Disposition Home, Home Health   Name of Home Health Agency Placement   [facility] PT, OT w/ ALF therapy team)   Mode of Transportation Ambulance  (MMT ETA 1600; MMT # [phone]; Trip# [phone])   Patient/Family/POA notified of transfer plan Yes   Patient agreeable to discharge plan/expected d/c date? Yes   Family/POA agreeable to discharge plan/expected d/c date? Yes   Bedside nurse notified of transport plan? Yes   Outpatient Services   Home Health Home PT/OT/ST   CM Interventions   Follow up appointment scheduled?(For PNA, COPD, MI) Yes   Referral made for home health RN visit? Yes   Multidisciplinary rounds/family meeting before d/c? Yes   Medicare Checklist   Is this a Medicare patient? Yes

## 2023-07-19 NOTE — PT Progress Note (Addendum)
 [facility]  Physical Therapy Attempt Note    Patient:  Vanessa Kelly MRN#:  [id number]    Unit:  [facility] INTERMEDIATE CARE Room/Bed:  MI621/MI621-02    PT Cancellation: Visit  PT Visit Cancellation Reason: Other (comment required) (MD and RN rounding; waited 5 minutes and team still rounding. PT will follow up with pt as schedule permits.)           Signature:   Williams Che, PT, DPT  07/19/2023  10:52 AM       (For scheduling questions, please contact rehab tech x [phone])

## 2023-07-19 NOTE — Progress Notes (Signed)
 Daughter took patient to [place] instead

## 2023-07-21 LAB — CULTURE,BLOOD PEDIATRIC (3-10ML),AEROBIC ONLY
Culture Blood: NO GROWTH
Culture Blood: NO GROWTH

## 2023-07-25 ENCOUNTER — Ambulatory Visit

## 2023-08-08 ENCOUNTER — Ambulatory Visit
Admission: RE | Admit: 2023-08-08 | Discharge: 2023-08-08 | Disposition: A | Discharge status: Home / Self Care | Source: Ambulatory Visit | Attending: General Acute Care Hospital | Admitting: General Acute Care Hospital

## 2023-08-08 DIAGNOSIS — Z1231 Encounter for screening mammogram for malignant neoplasm of breast: Secondary | ICD-10-CM | POA: Insufficient documentation

## 2023-08-10 ENCOUNTER — Ambulatory Visit (INDEPENDENT_AMBULATORY_CARE_PROVIDER_SITE_OTHER): Payer: Medicare Other | Admitting: Internal Medicine

## 2023-08-15 ENCOUNTER — Encounter (INDEPENDENT_AMBULATORY_CARE_PROVIDER_SITE_OTHER): Payer: Self-pay | Admitting: Internal Medicine

## 2023-08-15 ENCOUNTER — Ambulatory Visit (INDEPENDENT_AMBULATORY_CARE_PROVIDER_SITE_OTHER): Admitting: Internal Medicine

## 2023-08-15 VITALS — BP 121/71 | HR 72 | Ht 69.0 in | Wt 182.0 lb

## 2023-08-15 DIAGNOSIS — I2584 Coronary atherosclerosis due to calcified coronary lesion: Secondary | ICD-10-CM

## 2023-08-15 DIAGNOSIS — I2510 Atherosclerotic heart disease of native coronary artery without angina pectoris: Secondary | ICD-10-CM

## 2023-08-15 DIAGNOSIS — I10 Essential (primary) hypertension: Secondary | ICD-10-CM

## 2023-08-15 NOTE — Progress Notes (Signed)
 [facility] [place] OFFICE CONSULTATION    I had the pleasure of seeing [name] today for cardiovascular evaluation. She is a pleasant 83 y.o. female with a history of CABG 3V 2002 IFH, stents 2006 - [facility],  HTN, DM, CVA, carotid artery stenosis who presents for follow-up.  She has no cardiac complaints this visit.  Recent admission for UTI/cystitis.  She has no chest pain or shortness of breath.  No palpitations.  She did have increased dizziness when she had the UTI.  This has improved.  Blood pressure has been stable at home.  Rarely needs to take candesartan for elevated blood pressure readings.  Also really needs to take midodrine  for hypotension.  She wears her compression stockings regularly.  She walks with a walker.  Low risk nuclear stress test 03/2023.      Cardiologist Srinivas Addala MD -[facility]      PAST MEDICAL HISTORY: She has a past medical history of Diabetes mellitus (CMS/HCC), Gastroesophageal reflux disease, Hyperlipidemia, Hypertension, Hypotension, Seasonal allergic rhinitis, and Syncope. She has a past surgical history that includes Breast biopsy (Right); Hysterectomy; and CORONARY ARTERY BYPASS.        MEDICATIONS:   Current Outpatient Medications   Medication Sig Dispense Refill    acetaminophen  (TYLENOL ) 325 MG tablet Take 2 tablets (650 mg) by mouth every 6 (six) hours as needed for Pain (or headache)      aspirin  EC 81 MG EC tablet Take 1 tablet (81 mg) by mouth daily      calcium  carbonate (TUMS) 500 MG chewable tablet Chew 1 tablet (500 mg) by mouth every 6 (six) hours as needed for Heartburn      candesartan (ATACAND) 4 MG tablet Take 1 tablet (4 mg) by mouth daily as needed (SBP > 160)      fexofenadine (ALLEGRA) 180 MG tablet Take 1 tablet (180 mg) by mouth daily      fluticasone  (Flonase  Sensimist) 27.5 MCG/SPRAY nasal spray 1 spray by Nasal route daily      lidocaine  (LIDODERM ) 5 % Place 1 patch onto the skin every 24  hours Remove & Discard patch within 12 hours or as directed by MD      lidocaine  (LIDODERM ) 5 % Place 1 patch onto the skin every 24 hours Remove & Discard patch within 12 hours or as directed by MD 30 patch 0    lovastatin (MEVACOR) 40 MG tablet Take 1 tablet (40 mg) by mouth nightly      midodrine  (PROAMATINE ) 5 MG tablet Take 1 tablet (5 mg) by mouth 3 (three) times daily As needed if SBP < 100 mm of HG      Multiple Vitamins-Minerals (Centrum Silver 50+Women) Tab Take 1 tablet by mouth daily      pantoprazole  (PROTONIX ) 40 MG tablet Take 1 tablet (40 mg) by mouth nightly      polyethylene glycol (MIRALAX ) 17 g packet Take 17 g by mouth daily      ranolazine  (RANEXA ) 500 MG 12 hr tablet Take 1 tablet (500 mg) by mouth 2 (two) times daily      traMADol  (ULTRAM ) 50 MG tablet Take 1 tablet (50 mg) by mouth every 8 (eight) hours as needed for Pain      venlafaxine  (EFFEXOR -XR) 37.5 MG 24 hr capsule Take 2 capsules (75 mg) by mouth nightly      albuterol  sulfate HFA (PROVENTIL ) 108 (90 Base) MCG/ACT inhaler  (Patient not taking: Reported on  08/15/2023)      ergocalciferol  (ERGOCALCIFEROL ) 1.25 MG (50000 UT) capsule Take 1 capsule (50,000 Units) by mouth once a week On Saturday      meclizine  (ANTIVERT ) 12.5 MG tablet Take 2 tablets (25 mg) by mouth 3 (three) times daily as needed for Dizziness (Patient not taking: Reported on 08/15/2023) 20 tablet 0    ondansetron  (ZOFRAN -ODT) 4 MG disintegrating tablet Take 1 tablet (4 mg) by mouth every 6 (six) hours as needed for Nausea (Patient not taking: Reported on 08/15/2023) 8 tablet 0    ondansetron  (ZOFRAN -ODT) 4 MG disintegrating tablet Take 1 tablet (4 mg) by mouth every 6 (six) hours as needed for Nausea (Patient not taking: Reported on 08/15/2023) 8 tablet 0    sucralfate  (CARAFATE ) 1 g tablet Take 1 tablet (1 g) by mouth every 6 (six) hours as needed (abdominal pain) 12 tablet 0     No current facility-administered medications for this visit.           ALLERGIES:    Allergies   Allergen Reactions    Fentanyl Anaphylaxis     Hives    Percolone [Oxycodone]          FAMILY HISTORY: Her family history includes Breast cancer in her sister; Heart failure in her mother; Myocardial Infarction in her father.      SOCIAL HISTORY: She reports that she quit smoking about 35 years ago. Her smoking use included cigarettes. She has never used smokeless tobacco. She reports that she does not currently use alcohol. She reports that she does not use drugs.      REVIEW OF SYSTEMS: All other systems reviewed and negative except as above.         PHYSICAL EXAMINATION  General Appearance:  A well-appearing female in no acute distress.    Visit Vitals  BP 121/71 (BP Site: Left arm, Patient Position: Sitting, Cuff Size: Large)   Pulse 72   Ht 1.753 m (5\' 9" )   Wt 82.6 kg (182 lb)   BMI 26.88 kg/m         HEENT: Sclera anicteric, conjunctiva without pallor, moist mucous membranes, normal dentition. No arcus.   Neck:  Supple without jugular venous distention. Thyroid  nonpalpable. Normal carotid upstrokes without bruits.   Chest: Clear to auscultation bilaterally with good air movement and respiratory effort and no wheezes, rales, or rhonchi   Cardiovascular: Normal S1 and physiologically split S2 without murmurs, gallops or rub. PMI of normal size and nondisplaced.    Extremities: Warm without significant edema.  Skin: No rash, xanthoma or xanthelasma.   Neuro: Alert and oriented x3. Grossly intact. Strength is symmetrical. Normal mood and affect.           ECG 07/15/2023, I have independently reviewed the tracing, sinus rhythm, inferior MI, non specific T changes      Nuclear stress test 04/20/2023    Summary    1. Study limited due to images obtained with the patient's right arm down.    2. Cannot rule out a small area of lateral wall ischemia vs artifact.    3. Gated wall motion study demonstrates normal left ventricular thickening  and wall motion with calculated ejection fraction 68 %.    4. No  prior studies are available for comparison.    Echocardiogram 12/05/2022    Summary    * Normal left ventricular size and systolic function, estimated EF 65 to  70%.    * Concentric left ventricular remodeling.    *  Aortic sclerosis (peak velocity 2.2 m/s), with trace regurgitation.    * Technically limited bubble study appears minimally positive.    * Compared to the prior study from 12/01/2020, no significant change.    Echocardiogram 12/01/2020         Summary    * Left ventricular ejection fraction is normal with an estimated ejection  fraction of 65-70%.    * There is mild concentric left ventricular hypertrophy.    * Left ventricular diastolic filling parameters demonstrate normal diastolic  function.    * Normal right ventricular systolic function.    * There is trace aortic regurgitation.    * No pulmonary hypertension with estimated right ventricular systolic  pressure of  23 mmHg.    * The IVC is normal in size with > 50% respiratory variance consistent with  normal RA pressure of 3 mmHg.    PET myocardial perfusion imaging 01/27/2020 - Equivocal. Small sized area of ischemia in the apical wall. EF 85%, no RWMA    Carotid artery Dopplers 05/11/2020      Findings:  The right and left side common carotid arteries, bifurcations, carotid bulbs as well as the internal and external carotid arteries show no evidence of obstruction or significant stenosis. Peak systolic flow velocities are within the normal range with normal-appearing Doppler waveforms.     Right common artery: Peak systolic velocity 184 cm/s     Right internal carotid artery: Peak systolic velocity 142 cm/s     Left common carotid artery: Peak systolic velocity 144 cm/s     Left internal carotid artery: Peak systolic velocity 165 cm/s     The right ICA/CCA ratio is 0.9     The left ICA/CCA ratio is 1.3   Atherosclerotic plaque is seen of the carotid bulbs bilaterally.   Vertebral artery flow is antegrade bilaterally       IMPRESSION:   1. Atherosclerotic  disease, bilateral 50-69% stenosis internal carotid arteries.        LABS:   Lab Results   Component Value Date    WBC 7.22 07/19/2023    HGB 9.0 (L) 07/19/2023    HCT 26.6 (L) 07/19/2023    PLT 215 07/19/2023    NA 139 07/19/2023    K 4.9 07/19/2023    MG 1.8 07/19/2023    BUN 22 (H) 07/19/2023    CREAT 1.2 (H) 07/19/2023    GLU 120 (H) 07/19/2023    AST 27 07/19/2023    ALT 21 07/19/2023    HGBA1C 5.8 (H) 06/07/2023    TSH 1.36 06/07/2023    TROPI 13.9 07/15/2023    DDIMER 0.83 (H) 01/21/2021    BNP 71 01/21/2021     Recent Labs     06/07/23  1002 12/05/22  0601 06/24/22  0620   Cholesterol 162 159 163   Triglycerides 52 69 39   HDL 80 75 74   LDL Calculated 72 70 81         IMPRESSION/RECOMMENDATIONS:     Orthostatic hypotension -stable, asymptomatic.  Continue with compression stockings.  Rarely needs to use midodrine .  Takes only as needed.    CAD status post CABG 2002, status post stents 2006 -stable, asymptomatic.  She reports cardiac catheterization in 2019/ 2020 at [facility], she was to have a stent at that time however the stent could not be placed.  Cardiac cath report requested multiple times but not obtained. Mild apical ischemia on PET myocardial fusion scan 01/2020.  Repeat nuclear stress test 04/20/2023, technically limited study, cannot exclude small area of lateral wall ischemia versus artifact.  EF 60%.  She remains asymptomatic.  Continue with aspirin  81 mg p.o. daily, lovastatin 40 mg p.o. daily, Ranexa  500 mg p.o. twice daily.  LDL slightly above goal 72, LDL goal of less than 70 discussed.  Normal AST and ALT 27 and 21.    Hypertension  -she takes candesartan only as needed for elevated blood pressure readings, rarely needs to use.    Prior history of CVA -continue with aspirin  and statin.      Diabetes -Per endocrinology/PCP.  Most recent hemoglobin A1c 5.8.    Carotid artery disease -seen previously by vascular surgery, Dr. Glee Lana, patient advised to follow-up.  Complete occlusion of right carotid  bulb and right internal carotid artery on MRA neck as above.  Continue with aspirin  and statin.    Depression.    Anemia - Hb 9.0, recommend follow up with PCP.     CKD -creatinine 1.2, follow-up with PCP.    Follow-up in 6 months.    Buell Carmin, MD  08/15/2023, 8:46 AM  [facility]  [phone]

## 2023-08-17 ENCOUNTER — Inpatient Hospital Stay
Admission: EM | Admit: 2023-08-17 | Discharge: 2023-08-19 | DRG: 093 | Disposition: A | Discharge status: Home / Self Care | Attending: Internal Medicine | Admitting: Internal Medicine

## 2023-08-17 ENCOUNTER — Emergency Department

## 2023-08-17 DIAGNOSIS — N183 Chronic kidney disease, stage 3 unspecified: Secondary | ICD-10-CM | POA: Diagnosis present

## 2023-08-17 DIAGNOSIS — Z803 Family history of malignant neoplasm of breast: Secondary | ICD-10-CM

## 2023-08-17 DIAGNOSIS — R519 Headache, unspecified: Secondary | ICD-10-CM | POA: Diagnosis present

## 2023-08-17 DIAGNOSIS — F419 Anxiety disorder, unspecified: Secondary | ICD-10-CM | POA: Diagnosis present

## 2023-08-17 DIAGNOSIS — Z87891 Personal history of nicotine dependence: Secondary | ICD-10-CM

## 2023-08-17 DIAGNOSIS — M79605 Pain in left leg: Secondary | ICD-10-CM | POA: Diagnosis present

## 2023-08-17 DIAGNOSIS — E1122 Type 2 diabetes mellitus with diabetic chronic kidney disease: Secondary | ICD-10-CM | POA: Diagnosis present

## 2023-08-17 DIAGNOSIS — I129 Hypertensive chronic kidney disease with stage 1 through stage 4 chronic kidney disease, or unspecified chronic kidney disease: Secondary | ICD-10-CM | POA: Diagnosis present

## 2023-08-17 DIAGNOSIS — I160 Hypertensive urgency: Secondary | ICD-10-CM | POA: Diagnosis present

## 2023-08-17 DIAGNOSIS — Z951 Presence of aortocoronary bypass graft: Secondary | ICD-10-CM

## 2023-08-17 DIAGNOSIS — I10 Essential (primary) hypertension: Secondary | ICD-10-CM

## 2023-08-17 DIAGNOSIS — G20C Parkinsonism, unspecified: Secondary | ICD-10-CM | POA: Diagnosis present

## 2023-08-17 DIAGNOSIS — E785 Hyperlipidemia, unspecified: Secondary | ICD-10-CM | POA: Diagnosis present

## 2023-08-17 DIAGNOSIS — K219 Gastro-esophageal reflux disease without esophagitis: Secondary | ICD-10-CM | POA: Diagnosis present

## 2023-08-17 DIAGNOSIS — R251 Tremor, unspecified: Principal | ICD-10-CM | POA: Diagnosis present

## 2023-08-17 DIAGNOSIS — Z8249 Family history of ischemic heart disease and other diseases of the circulatory system: Secondary | ICD-10-CM

## 2023-08-17 DIAGNOSIS — Z853 Personal history of malignant neoplasm of breast: Secondary | ICD-10-CM

## 2023-08-17 DIAGNOSIS — I440 Atrioventricular block, first degree: Secondary | ICD-10-CM | POA: Diagnosis present

## 2023-08-17 DIAGNOSIS — I2510 Atherosclerotic heart disease of native coronary artery without angina pectoris: Secondary | ICD-10-CM | POA: Diagnosis present

## 2023-08-17 DIAGNOSIS — D631 Anemia in chronic kidney disease: Secondary | ICD-10-CM | POA: Diagnosis present

## 2023-08-17 DIAGNOSIS — R079 Chest pain, unspecified: Secondary | ICD-10-CM

## 2023-08-17 DIAGNOSIS — F32A Depression, unspecified: Secondary | ICD-10-CM | POA: Diagnosis present

## 2023-08-17 DIAGNOSIS — Z90710 Acquired absence of both cervix and uterus: Secondary | ICD-10-CM

## 2023-08-17 LAB — URINALYSIS WITH REFLEX TO MICROSCOPIC EXAM - REFLEX TO CULTURE
Urine Bilirubin: NEGATIVE
Urine Blood: NEGATIVE
Urine Glucose: NEGATIVE
Urine Ketones: NEGATIVE mg/dL
Urine Leukocyte Esterase: NEGATIVE
Urine Nitrite: NEGATIVE
Urine Protein: NEGATIVE
Urine Specific Gravity: 1.011 (ref 1.001–1.035)
Urine Urobilinogen: NORMAL mg/dL (ref 0.2–2.0)
Urine pH: 6.5 (ref 5.0–8.0)

## 2023-08-17 LAB — LAB USE ONLY - CBC WITH DIFFERENTIAL
Absolute Basophils: 0.05 10*3/uL (ref 0.00–0.08)
Absolute Eosinophils: 1.16 10*3/uL — ABNORMAL HIGH (ref 0.00–0.44)
Absolute Immature Granulocytes: 0.02 10*3/uL (ref 0.00–0.07)
Absolute Lymphocytes: 2.1 10*3/uL (ref 0.42–3.22)
Absolute Monocytes: 0.52 10*3/uL (ref 0.21–0.85)
Absolute Neutrophils: 3.66 10*3/uL (ref 1.10–6.33)
Absolute nRBC: 0 10*3/uL (ref <=0.00)
Basophils %: 0.7 %
Eosinophils %: 15.4 %
Hematocrit: 28.4 % — ABNORMAL LOW (ref 34.7–43.7)
Hemoglobin: 9.7 g/dL — ABNORMAL LOW (ref 11.4–14.8)
Immature Granulocytes %: 0.3 %
Lymphocytes %: 28 %
MCH: 33.7 pg — ABNORMAL HIGH (ref 25.1–33.5)
MCHC: 34.2 g/dL (ref 31.5–35.8)
MCV: 98.6 fL — ABNORMAL HIGH (ref 78.0–96.0)
MPV: 9.9 fL (ref 8.9–12.5)
Monocytes %: 6.9 %
Neutrophils %: 48.7 %
Platelet Count: 202 10*3/uL (ref 142–346)
Preliminary Absolute Neutrophil Count: 3.66 10*3/uL (ref 1.10–6.33)
RBC: 2.88 10*6/uL — ABNORMAL LOW (ref 3.90–5.10)
RDW: 14 % (ref 11–15)
WBC: 7.51 10*3/uL (ref 3.10–9.50)
nRBC %: 0 /100{WBCs} (ref <=0.0)

## 2023-08-17 LAB — COVID-19 (SARS-COV-2) & INFLUENZA  A/B, NAA (ROCHE LIAT)
Influenza A RNA: NOT DETECTED
Influenza B RNA: NOT DETECTED
SARS-CoV-2 (COVID-19) RNA: NOT DETECTED

## 2023-08-17 LAB — HIGH SENSITIVITY TROPONIN-I: hs Troponin: 6.4 ng/L (ref <=14.0)

## 2023-08-17 LAB — NT-PROBNP: NT-ProBNP: 394 pg/mL (ref <450)

## 2023-08-17 LAB — WHOLE BLOOD GLUCOSE POCT: Whole Blood Glucose POCT: 91 mg/dL (ref 70–100)

## 2023-08-17 MED ORDER — VALSARTAN 40 MG PO TABS
40.0000 mg | ORAL_TABLET | Freq: Once | ORAL | Status: AC
Start: 2023-08-17 — End: 2023-08-17
  Administered 2023-08-17: 40 mg via ORAL
  Filled 2023-08-17: qty 1

## 2023-08-17 MED ORDER — LABETALOL HCL 5 MG/ML IV SOLN (WRAP)
10.0000 mg | Freq: Once | INTRAVENOUS | Status: DC
Start: 2023-08-17 — End: 2023-08-17
  Filled 2023-08-17: qty 4

## 2023-08-17 MED ORDER — ACETAMINOPHEN 500 MG PO TABS
1000.0000 mg | ORAL_TABLET | Freq: Once | ORAL | Status: AC
Start: 2023-08-17 — End: 2023-08-17
  Administered 2023-08-17: 1000 mg via ORAL
  Filled 2023-08-17: qty 2

## 2023-08-17 NOTE — ED Triage Notes (Signed)
 Pt placed in ed 22 alert and appropriate

## 2023-08-17 NOTE — EDIE (Signed)
 [facility] NOTIFICATION 08/17/2023 14:35 [name], [name] DOB: April 02, 1941 MRN: [id number]    [place] [facility]'s patient encounter information:   [id number]:[id number]  Account [id number]  Billing Account [id number]      Criteria Met      5 ED Visits in 12 Months    Security and Safety  No Security Events were found.  ED Care Guidelines  There are currently no ED Care Guidelines for this patient. Please check your facility's medical records system.        Prescription Monitoring Program  Narx Score not available at this time.    E.D. Visit Count (12 mo.)  Facility Visits   [place] [facility] 5   Total 5   Note: Visits indicate total known visits.     Recent Emergency Department Visit Summary  Date Facility [facility] Type Diagnoses or Chief Complaint    Aug 17, 2023  [place] [facility] [place] [facility]  [place]  Emergency      Generalized weakness      Jul 15, 2023  [place] [facility] [place] [facility]  [place]  [place]  Emergency      Altered mental status, unspecified      Altered Mental Status      Slurred Speech      Extremity Weakness      Urinary retention      Jun 21, 2023  [place] [facility]  [place]  [place]  Emergency      Noninfective gastroenteritis and colitis, unspecified      Emesis      Diarrhea      Medic      Jun 21, 2023  [place] [facility] [place] [facility]  [place]  [place]  Emergency      Pain in right shoulder      Noninfective gastroenteritis and colitis, unspecified      Arm Pain      Generalized weakness      Medic      Dec 04, 2022  [place] [facility]  [place]  [place]  Emergency      Dizziness and giddiness      Fall      Syncope      medic        Recent Inpatient Visit Summary  Date Facility [facility] Type Diagnoses or Chief Complaint    Jul 15, 2023  [place] [facility] [place] [facility]  [place]  [facility]      Acute cough      Wheezing      Acute cystitis without hematuria      Altered mental status, unspecified        Care Team  Provider Specialty Phone Fax Service Dates   Lizette Righter [phone]  Nurse Practitioner [phone] Current    BEHIRI, AMR , DO Internal Medicine   Current    DIAKIWSKY, CATHERINE, E, (FNP-BC) Nurse Practitioner: Family [phone] Current    KAY, Chana Comas, M.D. Internal Medicine   Current    Hilaria Loveless, M.D. Internal Medicine   Current      [facility]  This patient has registered at the [facility] Emergency Department  For more information visit: [email or web address]     PLEASE NOTE:     1.   Any care recommendations and other clinical information are provided as guidelines or for historical purposes only, and providers should exercise their own  clinical judgment when providing care.    2.   You may only use this information for purposes of treatment, payment or health care operations activities, and subject to the limitations of applicable [facility] Policies.    3.   You should consult directly with the organization that provided a care guideline or other clinical history with any questions about additional information or accuracy or completeness of information provided.    ? 2025 [facility] - [email or web address]

## 2023-08-17 NOTE — ED Provider Notes (Signed)
 EMERGENCY DEPARTMENT NOTE     Patient initially seen and examined at   ED PHYSICIAN ASSIGNED       Date/Time Event User Comments    08/17/23 2108 Physician Assigned Deforrest Bogle Jessieca Rhem, DO assigned as Attending           ED MIDLEVEL (APP) ASSIGNED       None            HISTORY OF PRESENT ILLNESS       Chief Complaint: Generalized weakness     History of Present Illness  The patient, who resides in an assisted living facility, presents with worsening tremors and weakness. She reports that while sitting outside under a gazebo, she fell asleep and woke up to find that her body was shaking uncontrollably. She had difficulty holding utensils and standing, and her legs were shaking. 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 She has been eating well and has not experienced any headaches or confusion. She reports a headache at the time of the visit but attributes it to the tremors. She denies any chest pain or shortness of breath. She reports some pain on both sides of her abdomen, which she attributes to a recent urinary tract infection (UTI). She also reports leg pain and discoloration, particularly in the leg where a vein was removed for a previous triple bypass surgery. She has not taken her blood pressure medication and her blood pressure is high.      Independent Historian (other than patient): EMS and Family (list in HPI)  Additional History Provided by Independent Historian: Ambulance 409 arrived to [address] to find a 83 year old female lying in her bed. Patient complaint of general weakness. Patient related she was having a good day enjoying the weather fell asleep outside and her neighbor woke her up. She stated she went to get something to eat and started to feel weak. She was assisted to her room where she laid down. Nursing home staff took her blood pressure and it was elevated. Patient decided to call 911 to be transported to the hospital. Patient's vitals were taken at her side and her  blood pressure was elevated slightly. Patient stated she would still like to be transported to the hospital to be evaluated. 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/2016    Seasonal allergic rhinitis     Syncope        Past Surgical History:  Past Surgical History[1]    Social History:  Social History[2]    Family History:  Family History[3]    Outpatient Medication:  Previous Medications    ACETAMINOPHEN  (TYLENOL ) 325 MG TABLET    Take 2 tablets (650 mg) by mouth every 6 (six) hours as needed for Pain (or headache)    ALBUTEROL  SULFATE HFA (PROVENTIL ) 108 (90 BASE) MCG/ACT INHALER        ASPIRIN  EC 81 MG EC TABLET    Take 1 tablet (81 mg) by mouth daily    CALCIUM  CARBONATE (TUMS) 500 MG CHEWABLE TABLET    Chew 1 tablet (500 mg) by mouth every 6 (six) hours as needed for Heartburn    CANDESARTAN (ATACAND)  4 MG TABLET    Take 1 tablet (4 mg) by mouth daily as needed (SBP > 160)    ERGOCALCIFEROL  (ERGOCALCIFEROL ) 1.25 MG (50000 UT) CAPSULE    Take 1 capsule (50,000 Units) by mouth once a week On Saturday    FEXOFENADINE (ALLEGRA) 180 MG TABLET    Take 1 tablet (180 mg) by mouth daily    FLUTICASONE  (FLONASE  SENSIMIST) 27.5 MCG/SPRAY NASAL SPRAY    1 spray by Nasal route daily    LIDOCAINE  (LIDODERM ) 5 %    Place 1 patch onto the skin every 24 hours Remove & Discard patch within 12 hours or as directed by MD    LIDOCAINE  (LIDODERM ) 5 %    Place 1 patch onto the skin every 24 hours Remove & Discard patch within 12 hours or as directed by MD    LOVASTATIN (MEVACOR) 40 MG TABLET    Take 1 tablet (40 mg) by mouth nightly    MECLIZINE  (ANTIVERT ) 12.5 MG TABLET    Take 2 tablets (25 mg) by mouth 3 (three) times daily as needed for Dizziness    MIDODRINE   (PROAMATINE ) 5 MG TABLET    Take 1 tablet (5 mg) by mouth 3 (three) times daily As needed if SBP < 100 mm of HG    MULTIPLE VITAMINS-MINERALS (CENTRUM SILVER 50+WOMEN) TAB    Take 1 tablet by mouth daily    ONDANSETRON  (ZOFRAN -ODT) 4 MG DISINTEGRATING TABLET    Take 1 tablet (4 mg) by mouth every 6 (six) hours as needed for Nausea    ONDANSETRON  (ZOFRAN -ODT) 4 MG DISINTEGRATING TABLET    Take 1 tablet (4 mg) by mouth every 6 (six) hours as needed for Nausea    PANTOPRAZOLE  (PROTONIX ) 40 MG TABLET    Take 1 tablet (40 mg) by mouth nightly    POLYETHYLENE GLYCOL (MIRALAX ) 17 G PACKET    Take 17 g by mouth daily    RANOLAZINE  (RANEXA ) 500 MG 12 HR TABLET    Take 1 tablet (500 mg) by mouth 2 (two) times daily    SUCRALFATE  (CARAFATE ) 1 G TABLET    Take 1 tablet (1 g) by mouth every 6 (six) hours as needed (abdominal pain)    TRAMADOL  (ULTRAM ) 50 MG TABLET    Take 1 tablet (50 mg) by mouth every 8 (eight) hours as needed for Pain    VENLAFAXINE  (EFFEXOR -XR) 37.5 MG 24 HR CAPSULE    Take 2 capsules (75 mg) by mouth nightly         REVIEW OF SYSTEMS   Review of Systems   Constitutional:  Positive for fatigue. Negative for fever.   Respiratory:  Negative for shortness of breath.    Cardiovascular:  Negative for chest pain.   Gastrointestinal:  Positive for abdominal pain. Negative for vomiting.   Neurological:  Positive for tremors and headaches.   Psychiatric/Behavioral:  Negative for confusion.     See History of Present Illness  PHYSICAL EXAM     ED Triage Vitals [08/17/23 2107]   Encounter Vitals Group      BP 137/74      Systolic BP Percentile       Diastolic BP Percentile       Heart Rate 78      Resp Rate 18      Temp 97.7 F (36.5 C)      Temp src       SpO2 98 %      Weight  Height       Head Circumference       Peak Flow       Pain Score 3      Pain Loc       Pain Education       Exclude from Growth Chart      Physical Exam  Constitutional:       Appearance: She is not toxic-appearing or diaphoretic.   HENT:       Head: Normocephalic and atraumatic.      Nose: Nose normal.      Mouth/Throat:      Mouth: Mucous membranes are moist.   Eyes:      Conjunctiva/sclera: Conjunctivae normal.      Pupils: Pupils are equal, round, and reactive to light.   Cardiovascular:      Rate and Rhythm: Normal rate and regular rhythm.      Heart sounds: Normal heart sounds.   Pulmonary:      Effort: Pulmonary effort is normal. No respiratory distress.      Breath sounds: Normal breath sounds. No wheezing, rhonchi or rales.   Chest:      Chest wall: No tenderness.   Abdominal:      General: Abdomen is flat. There is no distension.      Palpations: Abdomen is soft.      Tenderness: There is abdominal tenderness (RLQ/LLQ). There is no guarding or rebound.   Musculoskeletal:      Cervical back: Neck supple.      Right lower leg: No edema.      Left lower leg: Edema present.   Skin:     General: Skin is warm.      Capillary Refill: Capillary refill takes 2 to 3 seconds.   Neurological:      Mental Status: She is alert and oriented to person, place, and time.      Sensory: No sensory deficit.   Psychiatric:         Mood and Affect: Mood normal.           MEDICAL DECISION MAKING     PRIMARY PROBLEM LIST      Acute illness/injury DIAGNOSIS:tremor, fatigue  Chronic Illness Impacting Care of the above problem: Hypertension, Coronary Artery Disease, Asthma, and Advanced age Increases complexity of evaluation, Increases the risk of severe disease, and Increase the risk of disease progression  Differential Diagnosis: Abdominal pain non traumatic:  gastritis, gastroenteritis, enteritis, colitis, diverticulitis, appendicitis, cholecystitis, pancreatitis, AAA, obstruction, constipation, stone    DISCUSSION      This is a 83yoF with a PMH of hypertension, hyperlipidemia, diabetes, recent admission for sepsis related to UTI presenting to ER with EMS for worsening of fatigue and tremor today.  Has had return of abdominal pain concerning for UTI and was  discharged with pneumonia was told the antibiotic she was being discharged I will treat her UTI and her pneumonia.  Is hypertensive but reports has not taken her night medications.  Differential including UTI, pneumonia.  Does have left leg swelling concerning for possible blood clot.  Will obtain blood work including CBC, CMP, BNP, UA COVID flu testing, x-ray imaging of chest and ultrasound of leg.  Will provide Tylenol  for headache and blood pressure medication    If patient is being hospitalized is severe sepsis or septic shock suspected?: N/A            External Records Reviewed?: Inpatient Records and EMS Record    Additional Notes  ED Course as of 08/18/23 0133   Thu Aug 17, 2023   2139 Whole Blood Glucose POCT: 91  No hypoglycemia [ML]   2155 WBC: 7.51  No significant leukocytosis [ML]   2155 Hemoglobin(!): 9.7  Improved in comparison to last month, does have chronic anemia [ML]   2223 hs Troponin-I: 6.4  Decreased from previous, no chest pain. [ML]   2239 SARS-CoV-2 (COVID-19) RNA: Not Detected [ML]   2239 Influenza A RNA: Not Detected [ML]   2239 Influenza B RNA: Not Detected [ML]   2247 No LLE DVT [ML]   2247 Leukocyte Esterase, UA: Negative [ML]   2247 Nitrite, UA: Negative  No UTI [ML]   2305 NT-proBNP: 394  benign [ML]   2319 Spoke with patient regarding test results which are reassuring.  Patient does voice wanting to know what cause of tremors. Does have previous w/u for Parkinson's following with neurology this week.  [ML]   2359 CMP still pending, difficulty ambulating. Per RN unsteady on feet. [ML]   Fri Aug 18, 2023   0009 Creatinine(!): 1.3  At baseline [ML]   0035 RN attempted ambulation x2, reports patient failed trial. Will obtain orthostatics. [ML]   0133 Admitted to hospitalist team [ML]      ED Course User Index  [ML] Carmie Chough, DO         Vital Signs: Reviewed the patient's vital signs.   Nursing Notes: Reviewed and utilized available nursing notes.  Medical Records Reviewed:  Reviewed available past medical records.  Counseling: The emergency provider has spoken with the patient and discussed today's findings, in addition to providing specific details for the plan of care.  Questions are answered and there is agreement with the plan.      CARDIAC STUDIES    The following cardiac studies were independently interpreted by me the Emergency Medicine Provider.  For full cardiac study results please see chart.    Monitor Strip interpreted by me (ED provider)  Rate: 60-100  Rhythm: Normal Sinus Rhythm  ST segments: No acute changes    EKG 1 interpreted by me (ED provider)      Rate: 60-100  Rhythm: Normal Sinus Rhythm  ST segments: No acute changes  STEMI?: NO  EKG interpretation: Nonspecific                      RADIOLOGY IMAGING STUDIES      Chest AP Portable   Final Result       1.No acute cardiopulmonary process.   2.Cardiomegaly.      Marca Senna   08/17/2023 11:00 PM      US  Venous Dopp Left Low Extrem   Final Result       No sonographic evidence for left lower extremity deep venous thrombosis.      Maeola Schmidt, DO   08/17/2023 10:43 PM          EMERGENCY IMAGING STUDIES    The following imagine studies were independently interpreted by me (emergency medicine provider):    Chest Xray Interpreted by me (ED provider)     RESULT: No infiltrate. No pneumothorax. No large hemothorax. No CHF.  IMPRESSION: No acute abnormality              EMERGENCY DEPT. MEDICATIONS      ED Medication Orders (From admission, onward)      Start Ordered     Status Ordering Provider    08/18/23 2200 08/18/23 0132  venlafaxine  (  EFFEXOR -XR) 24 hr capsule 75 mg  At bedtime        Route: Oral  Ordered Dose: 75 mg       Ordered MONTFORD, CHERESSE P    08/18/23 0900 08/18/23 0132  aspirin  EC tablet 81 mg  Daily        Route: Oral  Ordered Dose: 81 mg       Ordered MONTFORD, CHERESSE P    08/18/23 0900 08/18/23 0132  atorvastatin  (LIPITOR) tablet 10 mg  Daily        Route: Oral  Ordered Dose: 10 mg       Ordered MONTFORD,  CHERESSE P    08/18/23 0900 08/18/23 0132  enoxaparin  (LOVENOX ) syringe 40 mg  Daily        Route: Subcutaneous  Ordered Dose: 40 mg       Ordered MONTFORD, CHERESSE P    08/18/23 0745 08/18/23 0132  pantoprazole  (PROTONIX ) EC tablet 40 mg  Every morning before breakfast        Route: Oral  Ordered Dose: 40 mg       Ordered MONTFORD, CHERESSE P    08/18/23 0132 08/18/23 0133  hydrALAZINE  (APRESOLINE ) injection 20 mg  Every 6 hours PRN        Route: Intravenous  Ordered Dose: 20 mg       Ordered MONTFORD, CHERESSE P    08/18/23 0132 08/18/23 0132  dextrose  (GLUCOSE) 40 % oral gel 15 g of glucose  As needed        Route: Oral  Ordered Dose: 15 g of glucose      Placed in "Or" Linked Group    Ordered MONTFORD, CHERESSE P    08/18/23 0132 08/18/23 0132  dextrose  (D10W) 10% bolus 125 mL  As needed        Route: Intravenous  Ordered Dose: 12.5 g      Placed in "Or" Linked Group    Ordered MONTFORD, CHERESSE P    08/18/23 0132 08/18/23 0132  dextrose  50 % bolus 12.5 g  As needed        Route: Intravenous  Ordered Dose: 12.5 g      Placed in "Or" Linked Group    Ordered MONTFORD, CHERESSE P    08/18/23 0132 08/18/23 0132  glucagon  (rDNA) (GLUCAGEN) injection 1 mg  As needed        Route: Intramuscular  Ordered Dose: 1 mg      Placed in "Or" Linked Group    Ordered MONTFORD, CHERESSE P    08/18/23 0132 08/18/23 0132  potassium chloride  (KLOR-CON  M20) CR tablet 0-60 mEq  As needed        Route: Oral  Ordered Dose: 0-60 mEq      Placed in "Or" Linked Group    Ordered MONTFORD, CHERESSE P    08/18/23 0132 08/18/23 0132  potassium chloride  (KLOR-CON ) packet 0-60 mEq  As needed        Route: Oral  Ordered Dose: 0-60 mEq      Placed in "Or" Linked Group    Ordered MONTFORD, CHERESSE P    08/18/23 0132 08/18/23 0132  potassium chloride  10 mEq in 100 mL IVPB (premix)  As needed        Route: Intravenous  Ordered Dose: 10 mEq      Placed in "Or" Linked Group    Ordered MONTFORD, CHERESSE P    08/18/23 0132 08/18/23 0132  potassium &  sodium phosphates  (PHOS-NAK) 280-160-250 MG packet  2 packet  As needed        Route: Oral  Ordered Dose: 2 packet       Ordered MONTFORD, CHERESSE P    08/18/23 0132 08/18/23 0132  naloxone  (NARCAN ) injection 0.2 mg  As needed        Route: Intravenous  Ordered Dose: 0.2 mg       Ordered MONTFORD, CHERESSE P    08/18/23 0132 08/18/23 0132  acetaminophen  (TYLENOL ) tablet 650 mg  Every 4 hours PRN        Route: Oral  Ordered Dose: 650 mg       Ordered MONTFORD, CHERESSE P    08/18/23 0132 08/18/23 0132  melatonin tablet 3 mg  At bedtime PRN        Route: Oral  Ordered Dose: 3 mg       Ordered MONTFORD, CHERESSE P    08/18/23 0132 08/18/23 0132  saline (OCEAN NASAL SPRAY) 0.65 % nasal solution 2 spray  Every 4 hours PRN        Route: Each Nare  Ordered Dose: 2 spray       Ordered MONTFORD, CHERESSE P    08/18/23 0132 08/18/23 0132  carboxymethylcellulose (PF) (REFRESH PLUS) 0.5 % ophthalmic solution 1 drop  3 times daily PRN        Route: Both Eyes  Ordered Dose: 1 drop       Ordered MONTFORD, CHERESSE P    08/18/23 0132 08/18/23 0132  benzocaine -menthol  (CEPACOL/CHLORASEPTIC) lozenge 1 lozenge  Every 2 hours PRN        Route: Buccal  Ordered Dose: 1 lozenge       Ordered MONTFORD, CHERESSE P    08/18/23 0132 08/18/23 0132  benzonatate  (TESSALON ) capsule 100 mg  3 times daily PRN        Route: Oral  Ordered Dose: 100 mg       Ordered MONTFORD, CHERESSE P    08/18/23 0132 08/18/23 0132  magnesium  sulfate 1g in dextrose  5% 100mL IVPB (premix)  As needed        Route: Intravenous  Ordered Dose: 1 g       Ordered MONTFORD, CHERESSE P    08/18/23 0131 08/18/23 0132  ondansetron  (ZOFRAN -ODT) disintegrating tablet 4 mg  Every 6 hours PRN        Route: Oral  Ordered Dose: 4 mg      Placed in "Or" Linked Group    Ordered MONTFORD, CHERESSE P    08/18/23 0131 08/18/23 0132  ondansetron  (ZOFRAN ) injection 4 mg  Every 6 hours PRN        Route: Intravenous  Ordered Dose: 4 mg      Placed in "Or" Linked Group    Ordered MONTFORD,  CHERESSE P    08/18/23 0050 08/18/23 0049  hydrALAZINE  (APRESOLINE ) injection 10 mg  Once        Route: Intravenous  Ordered Dose: 10 mg       Last MAR action: Given Jailen Coward    08/17/23 2242 08/17/23 2241  valsartan  (DIOVAN ) tablet 40 mg  Once        Route: Oral  Ordered Dose: 40 mg       Last MAR action: Given Adi Seales, [facility]    08/17/23 2128 08/17/23 2127    Once        Route: Intravenous  Ordered Dose: 10 mg       Discontinued Davari Lopes, [facility]    08/17/23 2128 08/17/23 2127  acetaminophen  (  TYLENOL ) tablet 1,000 mg  Once        Route: Oral  Ordered Dose: 1,000 mg       Last MAR action: Given Seva Chancy            LABORATORY RESULTS    Ordered and independently interpreted AVAILABLE laboratory tests.   Results for orders placed or performed during the hospital encounter of 08/17/23 (from the past 24 hours)    Collection Time: 08/17/23  8:59 PM   Result Value    Whole Blood Glucose POCT 91    Collection Time: 08/17/23  9:41 PM   Result Value    NT-ProBNP 394   High Sensitivity Troponin-I    Collection Time: 08/17/23  9:41 PM   Result Value    hs Troponin 6.4   CBC with Differential (Component)    Collection Time: 08/17/23  9:41 PM   Result Value    WBC 7.51    Hemoglobin 9.7 (L)    Hematocrit 28.4 (L)    Platelet Count 202    MPV 9.9    RBC 2.88 (L)    MCV 98.6 (H)    MCH 33.7 (H)    MCHC 34.2    RDW 14    nRBC % 0.0    Absolute nRBC 0.00    Preliminary Absolute Neutrophil Count 3.66    Neutrophils % 48.7    Lymphocytes % 28.0    Monocytes % 6.9    Eosinophils % 15.4    Basophils % 0.7    Immature Granulocytes % 0.3    Absolute Neutrophils 3.66    Absolute Lymphocytes 2.10    Absolute Monocytes 0.52    Absolute Eosinophils 1.16 (H)    Absolute Basophils 0.05    Absolute Immature Granulocytes 0.02   COVID-19 and Influenza ([place]) (symptomatic)    Collection Time: 08/17/23  9:46 PM    Specimen: Anterior Nares; Swab   Result Value    SARS-CoV-2 (COVID-19) RNA Not Detected    Influenza A RNA Not Detected    Influenza B RNA  Not Detected   Urinalysis with Reflex to Microscopic Exam and Culture    Collection Time: 08/17/23 10:18 PM    Specimen: Urine, Clean Catch   Result Value    Urine Color Straw    Urine Clarity Clear    Urine Specific Gravity 1.011    Urine pH 6.5    Urine Leukocyte Esterase Negative    Urine Nitrite Negative    Urine Protein Negative    Urine Glucose Negative    Urine Ketones Negative    Urine Urobilinogen Normal    Urine Bilirubin Negative    Urine Blood Negative   Comprehensive Metabolic Panel    Collection Time: 08/17/23 11:37 PM   Result Value    Glucose 115 (H)    BUN 20    Creatinine 1.3 (H)    Sodium 134 (L)    Potassium 4.7    Chloride 103    CO2 25    Calcium  10.0    Anion Gap 6.0    GFR 39.8 (L)    AST (SGOT) 23    ALT 20    Alkaline Phosphatase 88    Albumin 3.5    Protein, Total 7.2    Globulin 3.7 (H)    Albumin/Globulin Ratio 0.9    Bilirubin, Total 0.3         CRITICAL CARE/PROCEDURES    Procedures   DIAGNOSIS  Diagnosis:  Final diagnoses:   Tremor   Hypertension, unspecified type       Disposition:  ED Disposition       ED Disposition   Observation    Condition   --    Date/Time   Fri Aug 18, 2023  1:28 AM    Comment   Admitting Physician: Francine Iron [[id number]   Service:: Medicine [[id number]]   Estimated Length of Stay: < 2 midnights   Tentative Discharge Plan?: Home or Self Care [1]   Does patient need telemetry?: No                 Prescriptions:  Patient's Medications   New Prescriptions    No medications on file   Previous Medications    ACETAMINOPHEN  (TYLENOL ) 325 MG TABLET    Take 2 tablets (650 mg) by mouth every 6 (six) hours as needed for Pain (or headache)    ALBUTEROL  SULFATE HFA (PROVENTIL ) 108 (90 BASE) MCG/ACT INHALER        ASPIRIN  EC 81 MG EC TABLET    Take 1 tablet (81 mg) by mouth daily    CALCIUM  CARBONATE (TUMS) 500 MG CHEWABLE TABLET    Chew 1 tablet (500 mg) by mouth every 6 (six) hours as needed for Heartburn    CANDESARTAN (ATACAND) 4 MG TABLET    Take 1 tablet (4 mg) by  mouth daily as needed (SBP > 160)    ERGOCALCIFEROL  (ERGOCALCIFEROL ) 1.25 MG (50000 UT) CAPSULE    Take 1 capsule (50,000 Units) by mouth once a week On Saturday    FEXOFENADINE (ALLEGRA) 180 MG TABLET    Take 1 tablet (180 mg) by mouth daily    FLUTICASONE  (FLONASE  SENSIMIST) 27.5 MCG/SPRAY NASAL SPRAY    1 spray by Nasal route daily    LIDOCAINE  (LIDODERM ) 5 %    Place 1 patch onto the skin every 24 hours Remove & Discard patch within 12 hours or as directed by MD    LIDOCAINE  (LIDODERM ) 5 %    Place 1 patch onto the skin every 24 hours Remove & Discard patch within 12 hours or as directed by MD    LOVASTATIN (MEVACOR) 40 MG TABLET    Take 1 tablet (40 mg) by mouth nightly    MECLIZINE  (ANTIVERT ) 12.5 MG TABLET    Take 2 tablets (25 mg) by mouth 3 (three) times daily as needed for Dizziness    MIDODRINE  (PROAMATINE ) 5 MG TABLET    Take 1 tablet (5 mg) by mouth 3 (three) times daily As needed if SBP < 100 mm of HG    MULTIPLE VITAMINS-MINERALS (CENTRUM SILVER 50+WOMEN) TAB    Take 1 tablet by mouth daily    ONDANSETRON  (ZOFRAN -ODT) 4 MG DISINTEGRATING TABLET    Take 1 tablet (4 mg) by mouth every 6 (six) hours as needed for Nausea    ONDANSETRON  (ZOFRAN -ODT) 4 MG DISINTEGRATING TABLET    Take 1 tablet (4 mg) by mouth every 6 (six) hours as needed for Nausea    PANTOPRAZOLE  (PROTONIX ) 40 MG TABLET    Take 1 tablet (40 mg) by mouth nightly    POLYETHYLENE GLYCOL (MIRALAX ) 17 G PACKET    Take 17 g by mouth daily    RANOLAZINE  (RANEXA ) 500 MG 12 HR TABLET    Take 1 tablet (500 mg) by mouth 2 (two) times daily    SUCRALFATE  (CARAFATE ) 1 G TABLET    Take 1 tablet (1 g) by mouth  every 6 (six) hours as needed (abdominal pain)    TRAMADOL  (ULTRAM ) 50 MG TABLET    Take 1 tablet (50 mg) by mouth every 8 (eight) hours as needed for Pain    VENLAFAXINE  (EFFEXOR -XR) 37.5 MG 24 HR CAPSULE    Take 2 capsules (75 mg) by mouth nightly   Modified Medications    No medications on file   Discontinued Medications    No medications on  file           This note was generated by the Epic EMR system/ Dragon speech recognition and may contain inherent errors or omissions not intended by the user. Grammatical errors, random word insertions, deletions and pronoun errors  are occasional consequences of this technology due to software limitations. Not all errors are caught or corrected. If there are questions or concerns about the content of this note or information contained within the body of this dictation they should be addressed directly with the author for clarification.           [1]   Past Surgical History:  Procedure Laterality Date    BREAST BIOPSY Right     2014    CORONARY ARTERY BYPASS      HYSTERECTOMY      partial-1970's   [2]   Social History  Socioeconomic History    Marital status: Widowed   Tobacco Use    Smoking status: Former     Current packs/day: 0.00     Types: Cigarettes     Quit date: 1990     Years since quitting: 35.3    Smokeless tobacco: Never   Vaping Use    Vaping status: Never Used   Substance and Sexual Activity    Alcohol use: Not Currently     Comment: occasionally    Drug use: Never     Social Drivers of Psychologist, prison and probation services Strain: Low Risk  (07/17/2023)    Overall Financial Resource Strain (CARDIA)     Difficulty of Paying Living Expenses: Not hard at all   Food Insecurity: No Food Insecurity (12/04/2022)    Hunger Vital Sign     Worried About Running Out of Food in the Last Year: Never true     Ran Out of Food in the Last Year: Never true   Transportation Needs: No Transportation Needs (07/17/2023)    PRAPARE - Therapist, art (Medical): No     Lack of Transportation (Non-Medical): No   Intimate Partner Violence: Not At Risk (12/04/2022)    Humiliation, Afraid, Rape, and Kick questionnaire     Fear of Current or Ex-Partner: No     Emotionally Abused: No     Physically Abused: No     Sexually Abused: No   Housing Stability: Low Risk  (07/17/2023)    Housing Stability Vital Sign     Unable  to Pay for Housing in the Last Year: No     Number of Times Moved in the Last Year: 0     Homeless in the Last Year: No   [3]   Family History  Problem Relation Name Age of Onset    Heart failure Mother      Myocardial Infarction Father      Breast cancer Sister          Carmie Chough, DO  08/18/23 0134

## 2023-08-18 ENCOUNTER — Observation Stay

## 2023-08-18 ENCOUNTER — Encounter: Payer: Self-pay | Admitting: Internal Medicine

## 2023-08-18 DIAGNOSIS — G20A1 Parkinson's disease without dyskinesia, without mention of fluctuations: Secondary | ICD-10-CM | POA: Insufficient documentation

## 2023-08-18 DIAGNOSIS — E1151 Type 2 diabetes mellitus with diabetic peripheral angiopathy without gangrene: Secondary | ICD-10-CM | POA: Insufficient documentation

## 2023-08-18 DIAGNOSIS — N183 Chronic kidney disease, stage 3 unspecified: Secondary | ICD-10-CM | POA: Insufficient documentation

## 2023-08-18 DIAGNOSIS — R251 Tremor, unspecified: Principal | ICD-10-CM | POA: Diagnosis present

## 2023-08-18 LAB — LAB USE ONLY - CBC WITH DIFFERENTIAL
Absolute Basophils: 0.04 10*3/uL (ref 0.00–0.08)
Absolute Eosinophils: 1.3 10*3/uL — ABNORMAL HIGH (ref 0.00–0.44)
Absolute Immature Granulocytes: 0.01 10*3/uL (ref 0.00–0.07)
Absolute Lymphocytes: 1.99 10*3/uL (ref 0.42–3.22)
Absolute Monocytes: 0.54 10*3/uL (ref 0.21–0.85)
Absolute Neutrophils: 3.12 10*3/uL (ref 1.10–6.33)
Absolute nRBC: 0 10*3/uL (ref <=0.00)
Basophils %: 0.6 %
Eosinophils %: 18.6 %
Hematocrit: 27 % — ABNORMAL LOW (ref 34.7–43.7)
Hemoglobin: 9.5 g/dL — ABNORMAL LOW (ref 11.4–14.8)
Immature Granulocytes %: 0.1 %
Lymphocytes %: 28.4 %
MCH: 34.5 pg — ABNORMAL HIGH (ref 25.1–33.5)
MCHC: 35.2 g/dL (ref 31.5–35.8)
MCV: 98.2 fL — ABNORMAL HIGH (ref 78.0–96.0)
MPV: 10 fL (ref 8.9–12.5)
Monocytes %: 7.7 %
Neutrophils %: 44.6 %
Platelet Count: 193 10*3/uL (ref 142–346)
Preliminary Absolute Neutrophil Count: 3.12 10*3/uL (ref 1.10–6.33)
RBC: 2.75 10*6/uL — ABNORMAL LOW (ref 3.90–5.10)
RDW: 14 % (ref 11–15)
WBC: 7 10*3/uL (ref 3.10–9.50)
nRBC %: 0 /100{WBCs} (ref <=0.0)

## 2023-08-18 LAB — BASIC METABOLIC PANEL
Anion Gap: 6 (ref 5.0–15.0)
BUN: 18 mg/dL (ref 7–21)
CO2: 23 meq/L (ref 17–29)
Calcium: 10.2 mg/dL (ref 7.9–10.2)
Chloride: 106 meq/L (ref 99–111)
Creatinine: 1.3 mg/dL — ABNORMAL HIGH (ref 0.4–1.0)
GFR: 39.8 mL/min/{1.73_m2} — ABNORMAL LOW (ref >=60.0)
Glucose: 124 mg/dL — ABNORMAL HIGH (ref 70–100)
Potassium: 4.5 meq/L (ref 3.5–5.3)
Sodium: 135 meq/L (ref 135–145)

## 2023-08-18 LAB — ECG 12-LEAD
Atrial Rate: 64 {beats}/min
P Axis: 44 degrees
P-R Interval: 250 ms
Q-T Interval: 408 ms
QRS Duration: 96 ms
QTC Calculation (Bezet): 420 ms
R Axis: -12 degrees
T Axis: 45 degrees
Ventricular Rate: 64 {beats}/min

## 2023-08-18 LAB — COMPREHENSIVE METABOLIC PANEL
ALT: 20 U/L (ref <=55)
AST (SGOT): 23 U/L (ref <=41)
Albumin/Globulin Ratio: 0.9 (ref 0.9–2.2)
Albumin: 3.5 g/dL (ref 3.5–5.0)
Alkaline Phosphatase: 88 U/L (ref 37–117)
Anion Gap: 6 (ref 5.0–15.0)
BUN: 20 mg/dL (ref 7–21)
Bilirubin, Total: 0.3 mg/dL (ref 0.2–1.2)
CO2: 25 meq/L (ref 17–29)
Calcium: 10 mg/dL (ref 7.9–10.2)
Chloride: 103 meq/L (ref 99–111)
Creatinine: 1.3 mg/dL — ABNORMAL HIGH (ref 0.4–1.0)
GFR: 39.8 mL/min/{1.73_m2} — ABNORMAL LOW (ref >=60.0)
Globulin: 3.7 g/dL — ABNORMAL HIGH (ref 2.0–3.6)
Glucose: 115 mg/dL — ABNORMAL HIGH (ref 70–100)
Potassium: 4.7 meq/L (ref 3.5–5.3)
Protein, Total: 7.2 g/dL (ref 6.0–8.3)
Sodium: 134 meq/L — ABNORMAL LOW (ref 135–145)

## 2023-08-18 LAB — MAGNESIUM: Magnesium: 1.9 mg/dL (ref 1.6–2.6)

## 2023-08-18 LAB — HIGH SENSITIVITY TROPONIN-I WITH DELTA
hs Troponin-I Delta: 0
hs Troponin: 6.8 ng/L (ref <=14.0)

## 2023-08-18 LAB — LAB USE ONLY - URINE GRAY CULTURE HOLD TUBE

## 2023-08-18 MED ORDER — HYDRALAZINE HCL 20 MG/ML IJ SOLN
10.0000 mg | Freq: Once | INTRAMUSCULAR | Status: AC
Start: 2023-08-18 — End: 2023-08-18
  Administered 2023-08-18: 10 mg via INTRAVENOUS
  Filled 2023-08-18: qty 1

## 2023-08-18 MED ORDER — POTASSIUM CHLORIDE 10 MEQ/100ML IV SOLN (WRAP)
10.0000 meq | INTRAVENOUS | Status: DC | PRN
Start: 2023-08-18 — End: 2023-08-19

## 2023-08-18 MED ORDER — MIDODRINE HCL 5 MG PO TABS
5.0000 mg | ORAL_TABLET | Freq: Three times a day (TID) | ORAL | Status: DC | PRN
Start: 2023-08-18 — End: 2023-08-19

## 2023-08-18 MED ORDER — PANTOPRAZOLE SODIUM 40 MG PO TBEC
40.0000 mg | DELAYED_RELEASE_TABLET | Freq: Every morning | ORAL | Status: DC
Start: 2023-08-18 — End: 2023-08-19
  Administered 2023-08-18 – 2023-08-19 (×2): 40 mg via ORAL
  Filled 2023-08-18 (×2): qty 1

## 2023-08-18 MED ORDER — ACETAMINOPHEN 325 MG PO TABS
650.0000 mg | ORAL_TABLET | ORAL | Status: DC | PRN
Start: 2023-08-18 — End: 2023-08-19
  Administered 2023-08-18: 650 mg via ORAL
  Filled 2023-08-18: qty 2

## 2023-08-18 MED ORDER — ONDANSETRON 4 MG PO TBDP
4.0000 mg | ORAL_TABLET | Freq: Four times a day (QID) | ORAL | Status: DC | PRN
Start: 2023-08-18 — End: 2023-08-19

## 2023-08-18 MED ORDER — VENLAFAXINE HCL ER 75 MG PO CP24
75.0000 mg | ORAL_CAPSULE | Freq: Every evening | ORAL | Status: DC
Start: 2023-08-18 — End: 2023-08-18
  Filled 2023-08-18 (×2): qty 1

## 2023-08-18 MED ORDER — MELATONIN 3 MG PO TABS
3.0000 mg | ORAL_TABLET | Freq: Every evening | ORAL | Status: DC | PRN
Start: 2023-08-18 — End: 2023-08-19
  Administered 2023-08-18: 3 mg via ORAL
  Filled 2023-08-18: qty 1

## 2023-08-18 MED ORDER — GLUCOSE 40 % PO GEL (WRAP)
15.0000 g | ORAL | Status: DC | PRN
Start: 2023-08-18 — End: 2023-08-19

## 2023-08-18 MED ORDER — POTASSIUM CHLORIDE 20 MEQ PO PACK
0.0000 meq | PACK | ORAL | Status: DC | PRN
Start: 2023-08-18 — End: 2023-08-19

## 2023-08-18 MED ORDER — ATORVASTATIN CALCIUM 10 MG PO TABS
10.0000 mg | ORAL_TABLET | Freq: Every day | ORAL | Status: DC
Start: 2023-08-18 — End: 2023-08-19
  Administered 2023-08-18: 10 mg via ORAL
  Filled 2023-08-18: qty 1

## 2023-08-18 MED ORDER — CARBOXYMETHYLCELLULOSE SOD PF 0.5 % OP SOLN
1.0000 [drp] | Freq: Three times a day (TID) | OPHTHALMIC | Status: DC | PRN
Start: 2023-08-18 — End: 2023-08-19

## 2023-08-18 MED ORDER — DEXTROSE 10 % IV BOLUS
12.5000 g | INTRAVENOUS | Status: DC | PRN
Start: 2023-08-18 — End: 2023-08-19

## 2023-08-18 MED ORDER — POTASSIUM & SODIUM PHOSPHATES 280-160-250 MG PO PACK
2.0000 | PACK | ORAL | Status: DC | PRN
Start: 2023-08-18 — End: 2023-08-19

## 2023-08-18 MED ORDER — MAGNESIUM SULFATE IN D5W 1-5 GM/100ML-% IV SOLN
1.0000 g | INTRAVENOUS | Status: DC | PRN
Start: 2023-08-18 — End: 2023-08-19

## 2023-08-18 MED ORDER — NALOXONE HCL 0.4 MG/ML IJ SOLN (WRAP)
0.2000 mg | INTRAMUSCULAR | Status: DC | PRN
Start: 2023-08-18 — End: 2023-08-19

## 2023-08-18 MED ORDER — ONDANSETRON HCL 4 MG/2ML IJ SOLN
4.0000 mg | Freq: Four times a day (QID) | INTRAMUSCULAR | Status: DC | PRN
Start: 2023-08-18 — End: 2023-08-19

## 2023-08-18 MED ORDER — RANOLAZINE ER 500 MG PO TB12
500.0000 mg | ORAL_TABLET | Freq: Two times a day (BID) | ORAL | Status: DC
Start: 2023-08-18 — End: 2023-08-19
  Administered 2023-08-18 – 2023-08-19 (×2): 500 mg via ORAL
  Filled 2023-08-18 (×3): qty 1

## 2023-08-18 MED ORDER — POTASSIUM CHLORIDE CRYS ER 20 MEQ PO TBCR
0.0000 meq | EXTENDED_RELEASE_TABLET | ORAL | Status: DC | PRN
Start: 2023-08-18 — End: 2023-08-19

## 2023-08-18 MED ORDER — BENZOCAINE-MENTHOL MT LOZG (WRAP)
1.0000 | LOZENGE | OROMUCOSAL | Status: DC | PRN
Start: 2023-08-18 — End: 2023-08-19

## 2023-08-18 MED ORDER — HYDRALAZINE HCL 20 MG/ML IJ SOLN
20.0000 mg | Freq: Four times a day (QID) | INTRAMUSCULAR | Status: DC | PRN
Start: 2023-08-18 — End: 2023-08-19

## 2023-08-18 MED ORDER — BENZONATATE 100 MG PO CAPS
100.0000 mg | ORAL_CAPSULE | Freq: Three times a day (TID) | ORAL | Status: DC | PRN
Start: 2023-08-18 — End: 2023-08-19

## 2023-08-18 MED ORDER — ENOXAPARIN SODIUM 40 MG/0.4ML IJ SOSY
40.0000 mg | PREFILLED_SYRINGE | Freq: Every day | INTRAMUSCULAR | Status: DC
Start: 2023-08-18 — End: 2023-08-19
  Administered 2023-08-18 – 2023-08-19 (×2): 40 mg via SUBCUTANEOUS
  Filled 2023-08-18 (×2): qty 0.4

## 2023-08-18 MED ORDER — SALINE SPRAY 0.65 % NA SOLN
2.0000 | NASAL | Status: DC | PRN
Start: 2023-08-18 — End: 2023-08-19

## 2023-08-18 MED ORDER — GLUCAGON 1 MG IJ SOLR (WRAP)
1.0000 mg | INTRAMUSCULAR | Status: DC | PRN
Start: 2023-08-18 — End: 2023-08-19

## 2023-08-18 MED ORDER — DEXTROSE 50 % IV SOLN
12.5000 g | INTRAVENOUS | Status: DC | PRN
Start: 2023-08-18 — End: 2023-08-19

## 2023-08-18 MED ORDER — ASPIRIN 81 MG PO TBEC
81.0000 mg | DELAYED_RELEASE_TABLET | Freq: Every day | ORAL | Status: DC
Start: 2023-08-18 — End: 2023-08-19
  Administered 2023-08-18 – 2023-08-19 (×2): 81 mg via ORAL
  Filled 2023-08-18 (×2): qty 1

## 2023-08-18 MED ORDER — SODIUM CHLORIDE 0.9 % IV SOLN
INTRAVENOUS | Status: DC
Start: 2023-08-18 — End: 2023-08-19
  Filled 2023-08-18: qty 1000

## 2023-08-18 NOTE — Consults (Signed)
 Pace to face order for PT/OT faxed to [facility].

## 2023-08-18 NOTE — Progress Notes (Signed)
 08/18/23 1512 08/18/23 1515 08/18/23 1518   Vital Signs   Heart Rate 86 71 87   BP 153/70 90/51 92/53    BP Location Right arm Right arm Right arm   BP Method Automatic Automatic Automatic   MAP (mmHg) 98 (!) 64 66   Patient Position Lying Sitting Standing     Orthostatic vitals completed by OT. Pt remains asymptomatic and denies dizziness, headaches, and blurry vision. MD made aware. IV fluids started to help stabilize blood pressures. Plan of care discussed with patient and she verbalized understanding of the teaching using teach back method.

## 2023-08-18 NOTE — H&P (Signed)
 ADMISSION HISTORY AND PHYSICAL EXAM    [facility], DIVISION OF HOSPITALIST MEDICINE   [facility]   [place] Pager: [phone]      Date Time: 08/18/23 5:17 AM  Patient Name: Vanessa Kelly  Attending Physician: Francine Iron, MD  Primary Care Physician: Hannah Lewis, NP    CC: tremors, walking difficulty   History Gathered From: Self    Active Hospital Problems    Diagnosis    Tremor       Patient has a BMI of 27.05 kg/m2    Overweight: BMI of 25 to 29.9       Recent Labs     08/18/23  0320 08/17/23  2337   Sodium 135 134*     Diagnosis: Mild Hyponatremia     Recent Labs   Lab 08/18/23  0320 08/17/23  2141   Hemoglobin 9.5* 9.7*   Hematocrit 27.0* 28.4*   MCV 98.2* 98.6*   WBC 7.00 7.51   Platelet Count 193 202         Anemia Diagnosis: Unspecified Anemia (currently unable to determine type)     Assessment and Plan :   RAYVON ESPELAND is a 83 y.o. female with PMH of parkinsonism, labile BP, CAD s/p bypass and stent, HLD, GERD, DM2 presents due to worsening tremors and difficulty ambulating. Initial BP 137/74-->213/109, other vitals stable. Labs significant for No leukocytosis, Hb 9.7, COVID/flu negative, UA negative for infection, proBNP wnl, trop negative x2. EKG sinus rhythm w/ 1st degree AV blood, non ischemic. LLE duplex negative. Admitted for ambulatory dysfunction d/t worsening tremors 2/2 parkinsonism.      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   -continue aspirin  and statin    #anemia, likely d/t CKD  Hb 9.7, at baseline  -monitor     #CKD3  Cr 1.3, at baseline. GFR 39  -monitor     #GERD  -continue PPI    #anxiety/depression  -continue effexor      #DM2  No med currently.   -monitor POC    #hx of right breast cancer         VTE Prophylaxis-Medication VTE Prophylaxis Orders: enoxaparin  (LOVENOX ) syringe 40 mg  Mechanical VTE Prophylaxis Orders: Maintain sequential compression device  Nutrition:Adult diet Therapeutic/ Modified; Solid; Regular (IDDSI level 7); Thin (IDDSI level 0)      Code status: Full code     Status/Disposition:   Pt is admitted under OBSERVATION with above concerns.    Anticipated medical stability for discharge: 24 Hrs       History of Presenting Illnes   Vanessa Kelly is a 83 y.o. female with  PMH of parkinsonism, labile BP, CAD s/p bypass and stent, HLD, GERD, DM2 presents due to worsening tremors and difficulty ambulating. Pt states that her tremors today was the worse it has ever been. She could not hold anything in her hands at home due to shaking. Reports when she tries to stand she has uncontrollable shaking in her legs. Denies recent falls. Takes no meds currently for parkinsonism tremors. States that usually her tremors are mild, doesn't usually occur daily. Denies numbness or tingling in upper and lower extremities. No chest pain, sob, fevers, chills, dysuria, abd pain, nausea, vomiting, diarrhea, constipation.       Past Medical Histor     Past Medical History:   Diagnosis Date    Diabetes mellitus (CMS/HCC)     Gastroesophageal reflux disease     Hyperlipidemia     Hypertension     Hypotension     Malignant tumor of breast (CMS/HCC) 04/13/2016    Seasonal allergic rhinitis      Syncope          Available old records reviewed, including: EPIC     Past Surgical History:    has a past surgical history that includes Breast biopsy (Right); Hysterectomy; and CORONARY ARTERY BYPASS.      Family History:   family history includes Breast cancer in her sister; Heart failure in her mother; Myocardial Infarction in her father.    Social History:    reports that she quit smoking about 35 years ago. Her smoking use included cigarettes. She has never used smokeless tobacco. She reports that she does not currently use alcohol. She reports that she does not use drugs.    Allergies:   Allergies[1]    Medications:     Home Medications       Med List Status: RN Completed Set By: Florestine Hurl, RN at 08/18/2023  2:39 AM              acetaminophen  (TYLENOL ) 325 MG tablet     Take 2 tablets (650 mg) by mouth every 6 (six) hours as needed for Pain (or headache)     albuterol  sulfate HFA (PROVENTIL ) 108 (90 Base) MCG/ACT inhaler          Patient not taking: No sig reported     aspirin  EC 81 MG EC tablet     Take 1 tablet (81 mg) by mouth daily     calcium  carbonate (TUMS) 500 MG chewable tablet     Chew 1 tablet (500 mg) by mouth every 6 (six) hours as needed for Heartburn     candesartan (ATACAND) 4 MG tablet     Take 1 tablet (4 mg) by mouth daily as needed (SBP > 160)     ergocalciferol  (ERGOCALCIFEROL ) 1.25 MG (50000 UT) capsule     Take 1 capsule (50,000 Units) by mouth once a week On Saturday     fexofenadine (ALLEGRA) 180 MG tablet     Take 1 tablet (180 mg) by mouth daily     fluticasone  (Flonase  Sensimist) 27.5 MCG/SPRAY nasal spray     1 spray by Nasal route daily     lidocaine  (LIDODERM ) 5 %     Place 1 patch onto the skin in the morning. Remove & Discard patch within 12 hours or as directed by MD.     lidocaine  (LIDODERM ) 5 %     Place 1 patch onto the skin every 24 hours Remove & Discard patch within 12 hours  or as directed by MD     lovastatin (MEVACOR) 40 MG tablet     Take 1 tablet (40  mg) by mouth nightly     meclizine  (ANTIVERT ) 12.5 MG tablet     Take 2 tablets (25 mg) by mouth 3 (three) times daily as needed for Dizziness     Patient not taking: Reported on 08/15/2023     midodrine  (PROAMATINE ) 5 MG tablet     Take 1 tablet (5 mg) by mouth 3 (three) times daily As needed if SBP < 100 mm of HG     Multiple Vitamins-Minerals (Centrum Silver 50+Women) Tab     Take 1 tablet by mouth daily     ondansetron  (ZOFRAN -ODT) 4 MG disintegrating tablet     Take 1 tablet (4 mg) by mouth every 6 (six) hours as needed for Nausea     Patient not taking: Reported on 08/15/2023     ondansetron  (ZOFRAN -ODT) 4 MG disintegrating tablet     Take 1 tablet (4 mg) by mouth every 6 (six) hours as needed for Nausea     Patient not taking: Reported on 08/15/2023     pantoprazole  (PROTONIX ) 40 MG tablet     Take 1 tablet (40 mg) by mouth nightly     polyethylene glycol (MIRALAX ) 17 g packet     Take 17 g by mouth daily     ranolazine  (RANEXA ) 500 MG 12 hr tablet     Take 1 tablet (500 mg) by mouth 2 (two) times daily     sucralfate  (CARAFATE ) 1 g tablet     Take 1 tablet (1 g) by mouth every 6 (six) hours as needed (abdominal pain)     traMADol  (ULTRAM ) 50 MG tablet     Take 1 tablet (50 mg) by mouth every 8 (eight) hours as needed for Pain     venlafaxine  (EFFEXOR -XR) 37.5 MG 24 hr capsule     Take 2 capsules (75 mg) by mouth nightly            Reviewed Medications from Dispense report: [] YES / [] NO   Confirmed Medications with patient/family:     [] YES / [] NO       Review of Systems:   All other systems were reviewed and are negative except:as above in HPI     Physical Exam:   Patient Vitals for the past 24 hrs:   BP Temp Temp src Pulse Resp SpO2 Height Weight   08/18/23 0457 179/69 97.7 F (36.5 C) Oral 67 18 97 % -- --   08/18/23 0302 169/73 97.7 F (36.5 C) Oral 71 18 100 % -- --   08/18/23 0225 -- -- -- -- -- -- 1.753 m (5\' 9" ) 83.1 kg (183 lb 3.2 oz)   08/18/23 0121 181/76 97.8 F (36.6 C) Oral 70 -- 96 % -- --    08/18/23 0052 (!) 209/81 -- -- (!) 57 18 95 % -- --   08/18/23 0036 194/77 -- -- 66 18 -- -- --   08/18/23 0014 184/76 -- -- -- -- -- -- --   08/17/23 2346 190/78 -- -- 64 -- 95 % -- --   08/17/23 2315 199/78 -- -- 66 -- 97 % -- --   08/17/23 2314 (!) 204/84 -- -- 67 -- 97 % -- --   08/17/23 2300 198/81 -- -- 62 18 96 % -- --   08/17/23 2253 (!) 205/74 -- -- -- -- -- -- --   08/17/23  2249 (!) 203/84 -- -- 68 -- 96 % -- --   08/17/23 2211 -- -- -- 71 -- 98 % -- --   08/17/23 2127 (!) 213/109 -- -- 65 -- 97 % -- --   08/17/23 2107 137/74 97.7 F (36.5 C) -- 78 18 98 % -- --     Body mass index is 27.05 kg/m.    Intake/Output Summary (Last 24 hours) at 08/18/2023 0517  Last data filed at 08/18/2023 0500  Gross per 24 hour   Intake --   Output 1100 ml   Net -1100 ml         Physical Exam  Vitals reviewed.   Constitutional:       General: She is not in acute distress.  HENT:      Head: Normocephalic and atraumatic.      Mouth/Throat:      Mouth: Mucous membranes are moist.      Pharynx: Oropharynx is clear.   Eyes:      Conjunctiva/sclera: Conjunctivae normal.   Cardiovascular:      Rate and Rhythm: Normal rate and regular rhythm.      Heart sounds: Normal heart sounds. No murmur heard.  Pulmonary:      Effort: Pulmonary effort is normal. No respiratory distress.      Breath sounds: Normal breath sounds. No wheezing or rales.   Chest:      Chest wall: No tenderness.   Abdominal:      General: Abdomen is flat. Bowel sounds are normal. There is no distension.      Palpations: Abdomen is soft.      Tenderness: There is no abdominal tenderness. There is no guarding or rebound.   Musculoskeletal:      Cervical back: Normal range of motion and neck supple.      Right lower leg: No edema.      Left lower leg: No edema.   Skin:     General: Skin is warm and dry.   Neurological:      Mental Status: She is alert and oriented to person, place, and time.      Comments: Mild bilateral hand tremors             Labs:     Results for  orders placed or performed during the hospital encounter of 08/17/23 (from the past 24 hours)    Collection Time: 08/17/23  8:59 PM   Result Value    Whole Blood Glucose POCT 91    Collection Time: 08/17/23  9:41 PM   Result Value    NT-ProBNP 394   High Sensitivity Troponin-I    Collection Time: 08/17/23  9:41 PM   Result Value    hs Troponin 6.4   CBC with Differential (Component)    Collection Time: 08/17/23  9:41 PM   Result Value    WBC 7.51    Hemoglobin 9.7 (L)    Hematocrit 28.4 (L)    Platelet Count 202    MPV 9.9    RBC 2.88 (L)    MCV 98.6 (H)    MCH 33.7 (H)    MCHC 34.2    RDW 14    nRBC % 0.0    Absolute nRBC 0.00    Preliminary Absolute Neutrophil Count 3.66    Neutrophils % 48.7    Lymphocytes % 28.0    Monocytes % 6.9    Eosinophils % 15.4    Basophils % 0.7    Immature  Granulocytes % 0.3    Absolute Neutrophils 3.66    Absolute Lymphocytes 2.10    Absolute Monocytes 0.52    Absolute Eosinophils 1.16 (H)    Absolute Basophils 0.05    Absolute Immature Granulocytes 0.02   COVID-19 and Influenza ([place]) (symptomatic)    Collection Time: 08/17/23  9:46 PM    Specimen: Anterior Nares; Swab   Result Value    SARS-CoV-2 (COVID-19) RNA Not Detected    Influenza A RNA Not Detected    Influenza B RNA Not Detected   Urinalysis with Reflex to Microscopic Exam and Culture    Collection Time: 08/17/23 10:18 PM    Specimen: Urine, Clean Catch   Result Value    Urine Color Straw    Urine Clarity Clear    Urine Specific Gravity 1.011    Urine pH 6.5    Urine Leukocyte Esterase Negative    Urine Nitrite Negative    Urine Protein Negative    Urine Glucose Negative    Urine Ketones Negative    Urine Urobilinogen Normal    Urine Bilirubin Negative    Urine Blood Negative   Comprehensive Metabolic Panel    Collection Time: 08/17/23 11:37 PM   Result Value    Glucose 115 (H)    BUN 20    Creatinine 1.3 (H)    Sodium 134 (L)    Potassium 4.7    Chloride 103    CO2 25    Calcium  10.0    Anion Gap 6.0    GFR 39.8 (L)    AST  (SGOT) 23    ALT 20    Alkaline Phosphatase 88    Albumin 3.5    Protein, Total 7.2    Globulin 3.7 (H)    Albumin/Globulin Ratio 0.9    Bilirubin, Total 0.3    Collection Time: 08/17/23 11:37 PM   Result Value    Magnesium  1.9   Basic Metabolic Panel    Collection Time: 08/18/23  3:20 AM   Result Value    Glucose 124 (H)    BUN 18    Creatinine 1.3 (H)    Calcium  10.2    Sodium 135    Potassium 4.5    Chloride 106    CO2 23    Anion Gap 6.0    GFR 39.8 (L)   High Sensitivity Troponin-I with Delta    Collection Time: 08/18/23  3:20 AM   Result Value    hs Troponin 6.8    hs Troponin-I Delta 0   CBC with Differential (Component)    Collection Time: 08/18/23  3:20 AM   Result Value    WBC 7.00    Hemoglobin 9.5 (L)    Hematocrit 27.0 (L)    Platelet Count 193    MPV 10.0    RBC 2.75 (L)    MCV 98.2 (H)    MCH 34.5 (H)    MCHC 35.2    RDW 14    nRBC % 0.0    Absolute nRBC 0.00    Preliminary Absolute Neutrophil Count 3.12    Neutrophils % 44.6    Lymphocytes % 28.4    Monocytes % 7.7    Eosinophils % 18.6    Basophils % 0.6    Immature Granulocytes % 0.1    Absolute Neutrophils 3.12    Absolute Lymphocytes 1.99    Absolute Monocytes 0.54    Absolute Eosinophils 1.30 (H)    Absolute Basophils 0.04  Absolute Immature Granulocytes 0.01        EKG: sinus rhythm w/ 1st degree AV blood, non ischemic    Imaging personally reviewed, including: all available   Chest AP Portable  Result Date: 08/17/2023   1.No acute cardiopulmonary process. 2.Cardiomegaly. Marca Senna 08/17/2023 11:00 PM    US  Venous Dopp Left Low Extrem  Result Date: 08/17/2023   No sonographic evidence for left lower extremity deep venous thrombosis. Maeola Schmidt, DO 08/17/2023 10:43 PM          This note was generated by the Epic EMR system/ Dragon speech recognition and may contain inherent errors or omissions not intended by the user. Grammatical errors, random word insertions, deletions and pronoun errors  are occasional consequences of this technology due  to software limitations. Not all errors are caught or corrected. If there are questions or concerns about the content of this note or information contained within the body of this dictation they should be addressed directly with the author for clarification.    Signed by: Francine Iron, MD, MD   [id number], Anabel Balloon, NP         [1]   Allergies  Allergen Reactions    Fentanyl Anaphylaxis     Hives    Percolone [Oxycodone]

## 2023-08-18 NOTE — Significant Event (Signed)
 Pt is very hard stick. This Clinical research associate and few nurses tried to start IV on  patient but unsuccessful. ED and CCU made aware and have no resources to send help at this time. Charge nurse and MD made aware of situation.

## 2023-08-18 NOTE — Plan of Care (Signed)
 Contacted overnight regarding Patient has declined her Effexor , states that she does not take that anymore. Discontinued

## 2023-08-18 NOTE — Progress Notes (Addendum)
 4 eyes in 4 hours pressure injury assessment note:      Completed with: Gabby  Unit & Time admitted:  6B, 0240            Bony Prominences: Check appropriate box; if wound is present enter wound assessment in LDA     Occiput:                 [x] WNL  []  Wound present  Face:                     [x] WNL  []  Wound present  Ears:                      [x] WNL  []  Wound present  Spine:                    [x] WNL  []  Wound present  Shoulders:             [x] WNL  []  Wound present  Elbows:                  [x] WNL  []  Wound present  Sacrum/coccyx:     [x] WNL  []  Wound present  Ischial Tuberosity:  [x] WNL  []  Wound present  Trochanter/Hip:      [x] WNL  []  Wound present  Knees:                   [x] WNL  []  Wound present  Ankles:                   [x] WNL  []  Wound present  Heels:                    [x] WNL  []  Wound present  Other pressure areas:  []  Wound location       Device related: []  Device name:         LDA completed if wound present: yes/no  Consult WOCN if necessary    Other skin related issues, ie tears, rash, etc, document in Integumentary flowsheet

## 2023-08-18 NOTE — Progress Notes (Signed)
 Received pt from the ED, alert and oriented x 4, denies pain, SOB or dizziness. Reports weakness on BLE. Skin checks done, belongings documented, patient education conducted on disease process and safety precautions, POC discussed with pt, all questions and concerns addressed. Pt lost IV access to infiltration upon admission, states that she is a hard stick and will need an ultrasound for IV access placement. Call made to ED and ICU for ultrasound guided IV placement. Will pass onto dayshift nurse for follow up.

## 2023-08-18 NOTE — ED Notes (Signed)
 Attempted to ambulate patient again. Pt assisted into sitting on side of bed unable to stand without significant assistance. Pt attempted to take step, began to lose balance assisted into bed without falling/ injury. MD notified

## 2023-08-18 NOTE — OT Eval Note (Signed)
 Occupational Therapy Evaluation  Asa Lauth        Post Acute Care Therapy Recommendations:     Discharge Recommendations:  Home with supervision, Home with home health PT      DME needs IF patient is discharging home: No additional equipment/DME recommended at this time    Therapy discharge recommendations may change with patient status.  Please refer to most recent note for up-to-date recommendations.         [facility]  [address]  [place], [place] [address]  [phone]    Occupational Therapy Evaluation    Patient: DENELL COTHERN MRN: [id number]   Unit: [facility] INTERMEDIATE CARE Bed: MI623/MI623-02    Time of treatment:   OT Received On: 08/18/23  Start Time: 1500  Stop Time: 1525  Time Calculation (min): 25 min       Consult received for Asa Lauth for OT evaluation and treatment.  Patient's medical condition is appropriate for Occupational Therapy  intervention at this time.    Interpreter utilized: no, not indicated    Assessment     AMRITHA YORKE is a 83 y.o. female admitted 08/17/2023. With worsening tremors and weakness. CT imaging is negative for acute abnormality. Pmhx of L LE pain, parkinsonism, labile BP, CAD s/p bypass and stent, HLD, GERD, DM2.     At baseline, pt lives in ALF, is mod Indep in ADL's and ambulates with a rollator to the dining hall for meals.   Today pt received semi supine in bed, agreeable to participate in OT, denies dizziness during the session; noted mild brief B UE tremor once. Pt able to move supine to sit with supv/extra time; CG A/RW to move sit to stand (cues for hand placement) and amb 25'. Orthostatic BP taken, supine: 153/70, sitting: 90/51, standing: 92/53. Pt left in bed, needs at reach. Recommend d/c home with supv, HHPT.        Brief chart review completed including review of labs, review of imaging, review of vitals, and review of past hospitalizations.  Pt's ability to complete ADLs and functional transfers is impaired due to  the following deficits: decreased activity tolerance, decreased balance, decreased bed mobility, abnormal blood pressure, decreased coordination, gait impairment, decreased strength, and transfers .  Pt demonstrates performance deficits with grooming, bathing, dressing, toileting, and functional mobility. There are a few comorbidities or other factors that affect plan of care and require modification of task including: assistive device needed for mobility and orthostasis. Standardized tests and exams incorporated into evaluation include AM-PAC Daily Activity, balance, coordination, vision, mobility, ROM/strength, and cognition/orientation. Pt would continue to benefit from OT to address these deficits and increase functional independence.        Complexity Chart Review Performance Deficits Clinical Decision Making Hx/Comorbidities Assistance needed   Low  Brief 1-3 Limited options None None (or at baseline)       PMP - Progressive Mobility Protocol   PMP Activity: Step 7 - Walks out of Room  Distance Walked (ft) (Step 6,7): 25 Feet     Rehabilitation Potential: good    Interdisciplinary Communication: RN, PT    Plan     OT Plan  Risks/Benefits/POC Discussed with Pt/Family: With patient  Treatment Interventions: ADL retraining;Functional transfer training;UE strengthening/ROM;Compensatory technique education;Equipment eval/education;Patient/Family training  Discharge Recommendation: Home with supervision;Home with home health PT  DME Recommended for Discharge: No additional equipment/DME recommended at this time  OT Frequency Recommended: 2-3x/wk  Medical Diagnosis: Tremor [R25.1]  Hypertension, unspecified type [I10]    History of Present Illness: JOSCELYNE RENVILLE is a 83 y.o. female admitted on  08/17/2023 with "  Chief Complaint: Generalized weakness     History of Present Illness  The patient, who resides in an assisted living facility, presents with worsening tremors and weakness. She reports that while  sitting outside under a gazebo, she fell asleep and woke up to find that her body was shaking uncontrollably. She had difficulty holding utensils and standing, and her legs were shaking. 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 She has been eating well and has not experienced any headaches or confusion. She reports a headache at the time of the visit but attributes it to the tremors. She denies any chest pain or shortness of breath. She reports some pain on both sides of her abdomen, which she attributes to a recent urinary tract infection (UTI). She also reports leg pain and discoloration, particularly in the leg where a vein was removed for a previous triple bypass surgery. She has not taken her blood pressure medication and her blood pressure is high.        Independent Historian (other than patient): EMS and Family (list in HPI)  Additional History Provided by Independent Historian: Ambulance 409 arrived to [address] to find a 83 year old female lying in her bed. Patient complaint of general weakness. Patient related she was having a good day enjoying the weather fell asleep outside and her neighbor woke her up. She stated she went to get something to eat and started to feel weak. She was assisted to her room where she laid down. Nursing home staff took her blood pressure and it was elevated. Patient decided to call 911 to be transported to the hospital. Patient's vitals were taken at her side and her blood pressure was elevated slightly. Patient stated she would still like to be transported to the hospital to be evaluated. Patient was placed on stretcher and assisted to ambulance 409"  Emergency medicine note    Problem List[1]  Medical History[2]  Past Surgical History[3]    Tests/Labs:  Lab Results   Component Value Date/Time    HGB 9.5 (L) 08/18/2023 03:20 AM    HGB 10.4 (L) 06/28/2022 06:18 AM    HCT 27.0 (L) 08/18/2023 03:20 AM    HCT 31.8 (L) 06/28/2022 06:18 AM    K 4.5 08/18/2023 03:20  AM    K 4.5 06/28/2022 06:18 AM    NA 135 08/18/2023 03:20 AM    NA 140 06/28/2022 06:18 AM    INR 1.2 07/15/2023 01:04 PM    TROPI 6.8 08/18/2023 03:20 AM    TROPI 6.4 08/17/2023 09:41 PM    TROPI 13.9 07/15/2023 04:17 PM    TROPI 18.6 (H) 07/15/2023 01:04 PM    TROPI 6.7 06/23/2022 06:41 PM    TROPI <2.7 01/18/2022 05:15 PM    TROPI <0.01 01/21/2021 02:30 PM         Imaging:  CT Head WO Contrast  Result Date: 08/18/2023   1. No CT evidence of acute intracranial hemorrhage, herniation or hydrocephalus.   2. Cerebral volume loss and sequela of chronic ischemic disease. Laurine Pore, MD 08/18/2023 10:05 AM    Chest AP Portable  Result Date: 08/17/2023   1.No acute cardiopulmonary process. 2.Cardiomegaly. Marca Senna 08/17/2023 11:00 PM    US  Venous Dopp Left Low Extrem  Result Date: 08/17/2023   No sonographic evidence for left lower  extremity deep venous thrombosis. Maeola Schmidt, DO 08/17/2023 10:43 PM       Social History: (Per family/care provider, if patient is not able to give accurate report)  "Lives at [facility] in an apartment.  Entry Steps: 0             Inside steps: elevator access/use    Equipment at home:  rollator, walk in shower, grab bars in shower, shower chair, raised toilet seat, and grab bar around toilet area  Prior Level of Function:  light meal prep; eats all meals in dining hall (~40-50 ft to/from elevator to/from apartment and dining hall). Pt reports she attends activities in the community at times              Cognition: [facility]                         Mobility/Locomotion: modI with rollator              Feeding: independent              Grooming: independent               Bathing: modI with shower chair              Dressing: independent               Toileting: independent; uses external catheter home system at night"    Subjective   "I only saw the doctor once, no one has been in here to tell me what the plan is"  Patient is agreeable to participation in the therapy session. Nursing clears  patient for therapy.  Patient's Goal:  walk  Pain: pt denies pain      Objective     Precautions:   Precautions  Weight Bearing Status: no restrictions  Other Precautions: falls, Orthostatic BP    Patient is in bed with telemetry, PrimaFit (external female catheter), intravenous access, and bed alarm in place.       Observation of patient/vitals: supine: 153/70, sitting: 90/51, standing: 92/53  Vitals:    08/18/23 1157 08/18/23 1512 08/18/23 1515 08/18/23 1518   BP: 130/67 153/70 90/51 92/53    Pulse: 74 86 71 87   Resp: 18      Temp: 97.7 F (36.5 C)      TempSrc: Oral      SpO2: 98% 96%     Weight:       Height:           Orientation/Cognition:     Alert and Oriented x 4  Cognition: WFL      Musculoskeletal Examination:     ROM Strength   RUE WFL WFL   LUE WFL WFL         Sensation: Intact to light touch, denies numbness/tingling throughout BUE   Coordination: Intact gross motor and serial opposition to B hands    Vision: WFL  Hearing: WFL      Functional Mobility:   Supine to sit: Supv, extended time  Scooting: supv  Sit to Supine: supv  Sit to stand: CG A/RW, cues for proper hand placement  Stand to sit: CG A  Transfers: on/off bed  Mobility/Ambulation: CG A/RW    Balance:  Static Sit Balance: good  Dynamic Sit Balance: good  Static Stand Balance: good  Dynamic Stand Balance: fair+    Self Care:  Grooming: denies need  Feeding: Independent   Toileting: Prima  fit      Endurance: fair    Participation:  good    Education:  Educated Hydrographic surveyor to role of occupational therapy, plan of care, goals  of therapy, rationale for progressing mobility and safety with mobility and ADLs.    RN notified of session outcome and that patient was left in bed with all needs met and equipment intact.   Safety measures include: handoff to nurse/clin tech/ unit secretary completed, bed alarm activated, oriented to call bell and placed within reach, personal items within reach, assistive device positioned out of  reach, and bed placed in lowest position.   Mobility and ADL status posted at bedside and within E.M.R.        AM-PACT "6 Clicks" Daily Activity Inpatient Short Form  Inpatient AM-PACT Performed?: yes  Put On/Take Off Lower Body Clothing: 3  Assist with Bathing: 3  Assist with Toileting: 3  Put On/Take Off Upper Body Clothing: 4  Assist with Grooming: 4  Assist with Eating: 4  OT Daily Activity Raw Score: 21  CMS 0-100% Score: 32.79%       Goals:  Goals  Goal Formulation: Patient  Time For Goal Achievement: by time of discharge  Patient will groom self: Supervision;at sinkside  Patient will dress lower body: Supervision  Patient will toilet: Supervision  Pt will perform functional transfers: Supervision;with rolling walker             Therapist PPE during session procedural mask and gloves      Signature:   Vann Gent, OT  08/18/2023  4:01 PM             [1]   Patient Active Problem List  Diagnosis    Headache    Mastoiditis    Dizziness    Altered mental status    Tremor    Adrenal cortical hypofunction    Anemia    Atherosclerotic heart disease of native coronary artery without angina pectoris    DM2 (diabetes mellitus, type 2) (CMS/HCC)    Essential hypertension    Fall    Gastroesophageal reflux disease without esophagitis    Carotid artery disease    Malignant tumor of breast (CMS/HCC)    Mixed hyperlipidemia    Parkinson's disease (CMS/HCC)    Peripheral vascular disorder due to diabetes mellitus (CMS/HCC)    Stage 3 chronic kidney disease (CMS/HCC)    Unsteadiness on feet   [2]   Past Medical History:  Diagnosis Date    Diabetes mellitus (CMS/HCC)     Gastroesophageal reflux disease     Hyperlipidemia     Hypertension     Hypotension     Malignant tumor of breast (CMS/HCC) 04/13/2016    Seasonal allergic rhinitis     Syncope    [3]   Past Surgical History:  Procedure Laterality Date    BREAST BIOPSY Right     2014    CORONARY ARTERY BYPASS      HYSTERECTOMY      [phone]

## 2023-08-18 NOTE — Progress Notes (Addendum)
-   [facility] order entered for roc HH PT, OT w/ ALF facility team.      08/18/23 1642   Patient Type   Within 30 Days of Previous Admission? Yes  [facility] 3/15 - 3/19)   Healthcare Decisions   Interviewed: Other (Comment);Family  (Chart review; recent IDPA)   Name of interviewee if other than the pt: Moreen Apt (Daughter)  [phone]   Interviewee Contact Information: [phone]   Orientation/Decision Making Abilities of Patient Other (coment)  (Intermittent confusion)   Advance Directive Patient has advance directive, copy not in chart   Prior to admission   Prior level of function Ambulates with assistive device;Needs assistance with ADLs   Type of Residence Assisted living  [facility])   Home Layout Able to live on main level with bedroom/bathroom;Ramped entrance   Have running water , electricity, heat, etc? Yes   Living Arrangements Other (Comment)  Cabin crew)   How do you get to your MD appointments? Facility Staff/Daughter   How do you get your groceries? Facility Staff/Daughter   Who fixes your meals? Facility Staff/Daughter   Who does your laundry? Facility Staff/Daughter   Who picks up your prescriptions? Facility Staff/Daughter   Dressing Needs assistance   Grooming Needs assistance   Feeding Independent   Bathing Needs assistance   Toileting Needs assistance   DME Currently at [employer], [employer], [employer];ADLChief Financial Officer   Name of Prior Assisted Living Facility [facility] (Current)   Discharge Planning   Support Systems Children;Family members;Other (Comment)  Cabin crew)   Patient expects to be discharged to: Return to ALF ([facility]); medical transport   Anticipated [place] plan discussed with: Same as interviewed   Mode of transportation: Other  (Medical transport)   Does the patient have perscription coverage? Yes   Financial Resource Strain   How hard is it for you to pay for the very basics like food, housing, medical care, and heating? Not hard   Housing  Stability   In the last 12 months, was there a time when you were not able to pay the mortgage or rent on time? N   In the past 12 months, how many times have you moved where you were living? 0   At any time in the past 12 months, were you homeless or living in a shelter (including now)? N   Transportation Needs   In the past 12 months, has lack of transportation kept you from medical appointments or from getting medications? no   In the past 12 months, has lack of transportation kept you from meetings, work, or from getting things needed for daily living? No   Consults/Providers   PT Evaluation Needed 1   OT Evalulation Needed 1   SLP Evaluation Needed 1   Correct PCP listed in Epic? Yes   Family and PCP   PCP on file was verified as the current PCP? Patient/family states they do not have a PCP   In case you are admitted, transferred or discharged, would like family notified? Yes   Name of family member to be notified Daughter   In case you are admitted, transferred or discharged, would like your PCP notified? Yes

## 2023-08-18 NOTE — Plan of Care (Signed)
 Problem: Pain interferes with ability to perform ADL  Goal: Pain at adequate level as identified by patient  Outcome: Progressing  Flowsheets (Taken 08/18/2023 1011)  Pain at adequate level as identified by patient:   Identify patient comfort function goal   Assess for risk of opioid induced respiratory depression, including snoring/sleep apnea. Alert healthcare team of risk factors identified.   Assess pain on admission, during daily assessment and/or before any "as needed" intervention(s)   Reassess pain within 30-60 minutes of any procedure/intervention, per Pain Assessment, Intervention, Reassessment (AIR) Cycle   Evaluate patient's satisfaction with pain management progress   Evaluate if patient comfort function goal is met   Offer non-pharmacological pain management interventions   Consult/collaborate with Physical Therapy, Occupational Therapy, and/or Speech Therapy   Include patient/patient care companion in decisions related to pain management as needed     Problem: Side Effects from Pain Analgesia  Goal: Patient will experience minimal side effects of analgesic therapy  Outcome: Progressing  Flowsheets (Taken 08/18/2023 1011)  Patient will experience minimal side effects of analgesic therapy:   Monitor/assess patient's respiratory status (RR depth, effort, breath sounds)   Assess for changes in cognitive function   Prevent/manage side effects per LIP orders (i.e. nausea, vomiting, pruritus, constipation, urinary retention, etc.)   Evaluate for opioid-induced sedation with appropriate assessment tool (i.e. POSS)     Problem: Moderate/High Fall Risk Score >5  Goal: Patient will remain free of falls  Outcome: Progressing  Flowsheets (Taken 08/18/2023 0247 by Donelle Funk, [place], RN)  High (Greater than 13):   HIGH-Consider use of low bed   HIGH-Apply yellow "Fall Risk" arm band   HIGH-Bed alarm on at all times while patient in bed     Problem: Compromised Sensory Perception  Goal: Sensory Perception  Interventions  Outcome: Progressing  Flowsheets (Taken 08/18/2023 1011)  Sensory Perception Interventions: Offload heels, Pad bony prominences, Reposition q 2hrs/turn Clock, Q2 hour skin assessment under devices if present     Problem: Compromised Moisture  Goal: Moisture level Interventions  Outcome: Progressing  Flowsheets (Taken 08/18/2023 1011)  Moisture level Interventions: Moisture wicking products, Moisture barrier cream     Problem: Compromised Activity/Mobility  Goal: Activity/Mobility Interventions  Outcome: Progressing  Flowsheets (Taken 08/18/2023 1011)  Activity/Mobility Interventions: Pad bony prominences, TAP Seated positioning system when OOB, Promote PMP, Reposition q 2 hrs / turn clock, Offload heels     Problem: Compromised Nutrition  Goal: Nutrition Interventions  Outcome: Progressing  Flowsheets (Taken 08/18/2023 1011)  Nutrition Interventions: Discuss nutrition at Rounds, I&Os, Document % meal eaten, Daily weights     Problem: Compromised Friction/Shear  Goal: Friction and Shear Interventions  Outcome: Progressing  Flowsheets (Taken 08/18/2023 1011)  Friction and Shear Interventions: Pad bony prominences, Off load heels, HOB 30 degrees or less unless contraindicated, Consider: TAP seated positioning, Heel foams     Problem: Hemodynamic Status: Cardiac  Goal: Stable vital signs and fluid balance  Outcome: Progressing  Flowsheets (Taken 08/18/2023 1011)  Stable vital signs and fluid balance:   Assess signs and symptoms associated with cardiac rhythm changes   Monitor lab values     Problem: Safety  Goal: Patient will be free from injury during hospitalization  Outcome: Progressing  Flowsheets (Taken 08/18/2023 0353 by Donelle Funk, [place], RN)  Patient will be free from injury during hospitalization:   Assess patient's risk for falls and implement fall prevention plan of care per policy   Provide and maintain safe environment   Use  appropriate transfer methods   Ensure appropriate safety devices  are available at the bedside   Include patient/ family/ care giver in decisions related to safety   Hourly rounding  Goal: Patient will be free from infection during hospitalization  Outcome: Progressing  Flowsheets (Taken 08/18/2023 1011)  Free from Infection during hospitalization:   Assess and monitor for signs and symptoms of infection   Monitor lab/diagnostic results   Monitor all insertion sites (i.e. indwelling lines, tubes, urinary catheters, and drains)   Encourage patient and family to use good hand hygiene technique

## 2023-08-18 NOTE — ED to IP RN Note (Signed)
 [facility]  ED NURSING NOTE FOR THE RECEIVING INPATIENT NURSE   ED NURSE Revonda Castles [phone]   ED CHARGE RN Reymundo Caulk   ADMISSION INFORMATION   Vanessa Kelly is a 83 y.o. female admitted with an ED diagnosis of:    1. Tremor         Isolation: None   Allergies: Fentanyl and Percolone [oxycodone]   Holding Orders confirmed? Yes   Belongings Documented? Yes   Home medications sent to pharmacy confirmed? N/A   NURSING CARE   Patient Comes From:   Mental Status: Assisted Living  alert and oriented   ADL: Needs assistance with ADLs   Ambulation: 1 person assist   Pertinent Information  and Safety Concerns:     Broset Violence Risk Level: Low Pt has increased weakness and difficulty ambulating normally able to ambulate with walker today barely able to stand with 1 assist. Very unsteady      CT / NIH   CT Head ordered on this patient?  N/A   NIH/Dysphagia assessment done prior to admission? N/A   VITAL SIGNS (at the time of this note)      Vitals:    08/18/23 0121   BP: 181/76   Pulse: 70   Resp: 18   Temp: 97.7   SpO2: 96%     Pain Score: 0-No pain (08/17/23 2254)

## 2023-08-18 NOTE — Progress Notes (Signed)
 IV inserted by tech at bedside. Flushing well with no issues and patient denies any pain. IV fluids started as per order.

## 2023-08-18 NOTE — Consults (Signed)
 Vanessa Kelly     Patient Name: Vanessa Kelly, Vanessa Kelly     MRN: [id number]      CSN: [id number]         Account Information     Hosp Acct #   [id number] Patient Class   Inpatient Service  Medicine Accommodation Code  Intermediate Care      Admission Information     Admitting Physician:  Attending Physician: Vanessa Elam, Vanessa Kelly  Vanessa Elam, Vanessa Kelly Unit  [facility] INTERMED* L&D Status      Admitting Diagnosis: Tremor; Hypertension, unspecified type Room / Bed  MI623/MI623-02 L&D - Last Menstrual Cycle      Chief Complaint: Generalized weakness     Admit Type:  Admit Date/Time:  Discharge Date/Time: Emergency  08/17/2023 / 2107   /  Length of Stay: 0 Days    L&D EDD   Estimated Date of Delivery: None noted.      Patient Information              Home Address: [address] Vanessa Kelly [address]  [place] [address] Employer:  Employer Address:       ,     Main Phone: [phone] Employer Phone:     SSN: [id number]       DOB: June 27, 1940 (83 yrs)       Sex: Female Primary Care Physician: Vanessa Kelly   Marital Status: Widowed Referring Physician:       No ref. provider found   Race: Black or African American       Ethnicity: Not of Hispanic/Latino/S*       Emergency Contacts  Name Home Phone Work Phone Mobile Phone Relationship Vanessa Kelly [phone]   [phone] Daughter No   Vanessa Kelly       Daughter           Guarantor Information     Guarantor Name: Vanessa Kelly, Vanessa Kelly ID: [id number]   Guarantor Relationship to Pt: Self Guarantor Type: Personal/Family   Guarantor DOB:    20-Nov-1940 Billing Indicator:        Guarantor Address: [address]   [place], [place] [address]          Guarantor Home Phone: [phone] Guarantor Employer:        Guarantor Work Phone:   Pensions consultant Emp Phone:                     Chief Strategy Officer Name: [employer] Subscriber Name: Vanessa Kelly, Vanessa Kelly   Insurance Address:    [address]  [place], [place]  [address] Subscriber DOB: Oct 15, 1940      Subscriber ID: [id number]   Insurance Phone:   Pt Relationship to Sub:   Self   Insurance ID:   Coverage Eff From: 05/03/2003   Group Name:   Preauthorization #:     Group #:   Preauthorization Days:        Secondary Insurance     Insurance Name: [employer] Subscriber Name: Vanessa Kelly   Insurance Address:    [address]  [place], [place]  [address] Subscriber DOB: [birth year]      Subscriber ID: [id number]   Insurance Phone: [phone] Pt Relationship to Sub:   Self   Insurance ID:   Coverage Eff From: 10/02/2020   Group Name:   Preauthorization #:     Group #:   Preauthorization  Days:        [employer] Name: - Subscriber Name:     Community education officer Address:       ,   Statistician DOB:        Subscriber ID:     Press photographer:   Pt Relationship to Sub:       Insurance ID:   Coverage Eff From:     Group Name:   Preauthorization #:     Group #:   Preauthorization Days:           08/18/2023 4:52 PM          Home Kelly face-to-face (FTF) Encounter (Order [id number])  Consult  Date: 08/18/2023 Department: [facility] Intermediate Care Ordering/Authorizing: Vanessa Elam, Vanessa Kelly     Order Information    Order Date/Time Release Date/Time Start Date/Time End Date/Time   08/18/23 04:51 PM None 08/18/23 04:52 PM 08/18/23 04:52 PM     Order Details    Frequency Duration Priority Order Class   Once 1  occurrence Routine Hospital Performed     Standing Order Information    Remaining Occurrences Interval      1/1 Once               Provider Information    Ordering User Ordering Provider Authorizing Provider   Vanessa Poot, Vanessa Kelly Vanessa Elam, Vanessa Kelly Vanessa Elam, Vanessa Kelly   Attending Provider(s) Admitting Provider PCP   Vanessa Chough, Vanessa Kelly; Vanessa Kelly, Vanessa Carmine, Vanessa Kelly; Vanessa Elam, Vanessa Kelly Vanessa Elam, Vanessa Kelly Vanessa Lewis, Vanessa Kelly     Verbal Order Info    Action Created on Order Mode Entered by Responsible Provider Signed by Signed on    Ordering 08/18/23 1651 Telephone with readback Vanessa Poot, Vanessa Kelly Vanessa Elam, Vanessa Kelly             Comments    Provider to follow: Vanessa Lewis, Vanessa Kelly -- [phone]    Home PT required for gait and balance training, strengthening, mobility, fall prevention, and ADL training. Home OT required for program to improve ability to perform ADLs, restorative program to improve mobility and independence, recovery and maintenance skills, strategies to compensate for loss of functions.    Tremor [R25.1]  Hypertension, unspecified type [I10]                Home Kelly face-to-face (FTF) Encounter: Patient Communication     Not Released  Not seen         Order Questions    Question Answer   Date I saw the patient face-to-face: 08/18/2023   Evidence this patient is homebound because: B.  Profound weakness, poor balance/unsteady gait d/t illness/treatment/procedure    C.  Decreased endurance, strength, ROM, cadence, safety/judgment during mobility    F.  Deconditioned due to advance disease process requiring assistance to leave home    G.  Fall risk due to impaired coordination, gait and decreased balance    L.  Transfer/ambulation requires stand by assist and verbal cues to perform safely    M.  Unsteady gait, poor ambulation with history of falls   Medical conditions that necessitate Home Kelly care: B.  Functional impairment due to recent hospitalization/procedure/treatment   Per clinical findings, following services are medically necessary: PT    OT   Clinical findings that support the need for Physical Therapy. PT will A.  Evaluate and treat functional impairment and improve mobility    C.  Educate  on weight bearing status, stair/gait training, balance & coordination    D.  Provide services to help restore function, mobility, and releive pain    E.  Educate on functional mobility; bed, chair, sit, stand and transfer activities    F.  Perform home safety assessment & develop safe in home exercise program    G.   Implement activities to improve stance time, cadence & step length    H.  Educate on the safe use of assistive device/ durable medical equipment    I.  Instruct on restorative activities to restore ability to perform ADL   OT will provide assistance with: Home program to improve ability to perform ADLs    Recovery and maintenance skills    Basic motor function and reasoning abilities    Restorative program to improve mobility and independence    Strategies to compensate for loss of function                    Process Instructions    Please select Home Care Services medically necessary.    Based on the above findings, I certify that this patient is confined to the home and needs intermittent skilled nursing care, physical therapry and / or speech therapy or continues to need occupational therapy. The patient is under my care, and I have initiated the establishment of the plan of care. This patient will be followed by a physician who will periodically review the plan of care.     Collection Information            Consult Order Info    ID Description Priority Start Date Start Time   [id number] Home Kelly face-to-face (FTF) Encounter Routine 08/18/2023  4:52 PM   Provider Specialty Referred to   ______________________________________ _____________________________________                         Verbal Order Info    Action Created on Order Mode Entered by Responsible Provider Signed by Signed on   Ordering 08/18/23 1651 Telephone with readback Vanessa Poot, Vanessa Kelly Vanessa Elam, Vanessa Kelly             Patient Information    Patient Name  Vanessa Kelly Legal Sex  Female DOB  June 25, 1940       Reprint Order Requisition    Home Kelly face-to-face (FTF) Encounter (Order [id number]) on 08/18/23       Additional Information    Associated Reports External References   Priority and Order Details [facility]

## 2023-08-18 NOTE — Progress Notes (Signed)
 Pt. A/0x4 able to make needs known, on RA, VSS, complained of headaches managed with PRN Tylenol  with effective results. Pt tolerated her PO medications and meals well with no evidence of dhypsghia, N/V, or abdominal pain. Pt denies chest pain, SOB, and no respiratory distress noted.     Pt had adequate urine output via external catheter. Pt denies any urinary symptoms such as dysuria or flank pain.    Purposeful hourly rounding occurred. All fall and safety precautions in place, call bell within reach, and plan of care continues. Report given to oncoming nurse with all question and concerns addressed. Use of therapeutic communication and emotional support given.

## 2023-08-18 NOTE — Plan of Care (Signed)
 Problem: Moderate/High Fall Risk Score >5  Goal: Patient will remain free of falls  Outcome: Progressing  Flowsheets (Taken 08/18/2023 0247)  High (Greater than 13):   HIGH-Consider use of low bed   HIGH-Apply yellow "Fall Risk" arm band   HIGH-Bed alarm on at all times while patient in bed     Problem: Compromised Sensory Perception  Goal: Sensory Perception Interventions  Outcome: Progressing     Problem: Compromised Moisture  Goal: Moisture level Interventions  Outcome: Progressing     Problem: Compromised Activity/Mobility  Goal: Activity/Mobility Interventions  Outcome: Progressing     Problem: Compromised Nutrition  Goal: Nutrition Interventions  Outcome: Progressing     Problem: Compromised Friction/Shear  Goal: Friction and Shear Interventions  Outcome: Progressing     Problem: Hemodynamic Status: Cardiac  Goal: Stable vital signs and fluid balance  Outcome: Progressing  Flowsheets (Taken 08/18/2023 0353)  Stable vital signs and fluid balance:   Assess signs and symptoms associated with cardiac rhythm changes   Monitor lab values     Problem: Safety  Goal: Patient will be free from injury during hospitalization  Outcome: Progressing  Flowsheets (Taken 08/18/2023 0353)  Patient will be free from injury during hospitalization:   Assess patient's risk for falls and implement fall prevention plan of care per policy   Provide and maintain safe environment   Use appropriate transfer methods   Ensure appropriate safety devices are available at the bedside   Include patient/ family/ care giver in decisions related to safety   Hourly rounding  Goal: Patient will be free from infection during hospitalization  Outcome: Progressing  Flowsheets (Taken 08/18/2023 0353)  Free from Infection during hospitalization:   Assess and monitor for signs and symptoms of infection   Monitor lab/diagnostic results   Monitor all insertion sites (i.e. indwelling lines, tubes, urinary catheters, and drains)   Encourage patient and family  to use good hand hygiene technique

## 2023-08-18 NOTE — Plan of Care (Signed)
 Problem: Moderate/High Fall Risk Score >5  Goal: Patient will remain free of falls  Outcome: Progressing  Flowsheets (Taken 08/18/2023 2200)  High (Greater than 13):   HIGH-Consider use of low bed   HIGH-Apply yellow "Fall Risk" arm band   HIGH-Bed alarm on at all times while patient in bed     Problem: Compromised Sensory Perception  Goal: Sensory Perception Interventions  Outcome: Progressing     Problem: Compromised Moisture  Goal: Moisture level Interventions  Outcome: Progressing     Problem: Compromised Activity/Mobility  Goal: Activity/Mobility Interventions  Outcome: Progressing     Problem: Compromised Nutrition  Goal: Nutrition Interventions  Outcome: Progressing     Problem: Compromised Friction/Shear  Goal: Friction and Shear Interventions  Outcome: Progressing     Problem: Hemodynamic Status: Cardiac  Goal: Stable vital signs and fluid balance  Outcome: Progressing  Flowsheets (Taken 08/18/2023 2245)  Stable vital signs and fluid balance:   Assess signs and symptoms associated with cardiac rhythm changes   Monitor lab values     Problem: Safety  Goal: Patient will be free from injury during hospitalization  Outcome: Progressing  Flowsheets (Taken 08/18/2023 2245)  Patient will be free from injury during hospitalization:   Assess patient's risk for falls and implement fall prevention plan of care per policy   Provide and maintain safe environment   Use appropriate transfer methods   Ensure appropriate safety devices are available at the bedside   Include patient/ family/ care giver in decisions related to safety   Hourly rounding   Assess for patients risk for elopement and implement Elopement Risk Plan per policy  Goal: Patient will be free from infection during hospitalization  Outcome: Progressing  Flowsheets (Taken 08/18/2023 2245)  Free from Infection during hospitalization:   Assess and monitor for signs and symptoms of infection   Monitor lab/diagnostic results   Monitor all insertion sites (i.e.  indwelling lines, tubes, urinary catheters, and drains)   Encourage patient and family to use good hand hygiene technique

## 2023-08-18 NOTE — Consults (Signed)
 NEUROLOGY CONSULTATION    Date Time: 08/18/23 11:20 AM  Patient Name: Vanessa Kelly  Attending Physician: Darlynn Elam, MD      Assessment & Plan:   Episode of severe tremor, affecting both upper and lower extremities, not sure about cause, I suspect patient could be having orthostatic tremor I do not see any obvious Parkinson's features on exam  History of orthostatic hypotension  Labile blood pressure initial blood pressure reading 213/137  Unexplained falls likely orthostasis is  Check folate levels thyroid  functions  Check orthostatics   May need to try therapeutic trial with gabapentin  Given lingering issues with tremor intermittently, consider referral to tertiary care center such as [facility]  to see if they can figure out another test to consider would be a DaTscan, as outpatient        History of Present Illness:   Neurology consultation requested by:-->Dr Molla   83 year old lady, recently admitted in March, to [facility] for pneumonia acute cystitis and UTI.  At that time she had some altered mental status, patient is a resident of a assisted living.  She reports that yesterday she was sitting in the gazebo, tried to get some sun and fell asleep when she woke up, she walked in to get her meal, but by the time she reached her dining table, she had severe tremor affecting her right upper extremity that she could not even hold a fork she tried with the left hand, she was having tremors with that as well and then when she tried to stand up, she felt severe tremors in the lower extremities that she was not able to walk and she was brought over to the ED in the ED she was found to have extreme    High blood pressure readings, CT head was done which is negative basic labs overall look negative she is admitted for further evaluation    I have seen her in the past, for orthostatic hypotension and unexplained falls      Prior workup has included brain MRI, and also cervical spine, no  cord compression seen on C-spine  B12 673                 Past Medical History:   Medical History[1]    Meds:   Home medications Tylenol  aspirin  Mevacor vitamin D  Allegra Lidoderm  patches multivitamins Protonix  Ranexa  Effexor  Atacand midodrine  meclizine  Zofran       Allergies[2]    Social & Family History:   Social History[3]  Family History[4]        CODE STATUS: Full code  I personally reviewed all of the medications.  Medication list generated using all available resources.  Elder abuse (physical)  - negative  Advanced care plan - reviewed from chart or in discussion with pt or family    Review of Systems:   No headache, eye, ear nose, throat problems; no coughing or wheezing or shortness of breath, No chest pain or orthopnea, no abdominal pain, nausea or vomiting, No pain in the body or extremities, no psychiatric, neurological, endocrine, hematological or cardiac complaints except as noted above.          Physical Exam:   Blood pressure 157/64, pulse 70, temperature 98.4 F (36.9 C), temperature source Oral, resp. rate 18, height 1.753 m (5\' 9" ), weight 83.1 kg (183 lb 3.2 oz), SpO2 99%.    HEENT: Normocephalic.no carotid bruits  Lungs:  CTA bil  Abd Soft   Cardiac:  S1,S2,  normal rate and rhythm  Neck: supple, no cartoid bruits  Extremities: no edema  Skin: no rashes seen in exposed areas     Neuro:  Level of consciousness:  Alert and appropriate  Oriented:  X 3  Cognition:  Intact naming, recognition, concentration and following complex commands  Cranial Nerves:  II-XII intact reduced nasolabial fold on the right pupils unequal right pupil surgical larger as compared to the left  Strength:  No upper extremity drift, 5/5 strength x 4 extremities  Coordination:  Intact FTN testing  Reflexes:  +2 to 3 range in the left knee difficult to elicit in the right knee 1+ otherwise throughout, down going toes bil  Sensation: Intact x 4 extremities to LT, temp,       No evidence of masked facies or hypophonia no obvious  parkinsonian features noted either  No cogwheeling no rigidity very minimal action tremor in the left upper extremity when she was holding the phone to her ear almost no tremor to the outstretched hands, finger-nose performed well no asterixis no clonus finger flexors negative        Labs:     Recent Labs   Lab 08/18/23  0320 08/17/23  2337   Glucose 124* 115*   BUN 18 20   Creatinine 1.3* 1.3*   Calcium  10.2 10.0   Sodium 135 134*   Potassium 4.5 4.7   Chloride 106 103   CO2 23 25   Albumin  --  3.5   Magnesium   --  1.9   AST (SGOT)  --  23   ALT  --  20   Bilirubin, Total  --  0.3   Alkaline Phosphatase  --  88     Recent Labs   Lab 08/18/23  0320 08/17/23  2141   WBC 7.00 7.51   Hemoglobin 9.5* 9.7*   Hematocrit 27.0* 28.4*   MCV 98.2* 98.6*   MCH 34.5* 33.7*   MCHC 35.2 34.2   Platelet Count 193 202         No results for input(s): "PTT", "PT", "INR" in the last 72 hours.       Radiology Results (24 Hour)       Procedure Component Value Units Date/Time    CT Head WO Contrast [[id number]] Collected: 08/18/23 0959    Order Status: Completed Updated: 08/18/23 1007    Narrative:      HISTORY: tremors.     COMPARISON: Comparison is made to CT of the brain dated 07/15/2023.    TECHNIQUE: CT of the head performed without intravenous contrast. The  following dose reduction techniques were utilized: automated exposure  control and/or adjustment of the mA and/or KV according to patient size,  and the use of an iterative reconstruction technique.    CONTRAST: None.    FINDINGS: There is an unchanged focus of hyperdensity along  the ventral anterior/superior interhemispheric falx that favors a focus of  calcification, please see image 149 through 151 of series 4.  There is no  evidence of acute intracranial hemorrhage.  The ventricles and sulci are mildly enlarged, consistent with generalized  parenchymal volume loss. The ventricles are symmetric and the basal  cisterns are patent. Scattered periventricular, deep and  subcortical white  matter hypoattenuation is nonspecific, but most likely reflects the sequela  of chronic small vessel ischemic change. There is no extra-axial fluid  collection, midline shift or mass-effect is evident. No CT evidence of  acute large vessel or territorial ischemia.  Vascular calcifications  indicate intracranial atherosclerosis. Subtotal opacification within the  right maxillary sinus is noted.     Impression:           1. No CT evidence of acute intracranial hemorrhage, herniation or  hydrocephalus.       2. Cerebral volume loss and sequela of chronic ischemic disease.    Laurine Pore, MD  08/18/2023 10:05 AM    Chest AP Portable [[id number]] Collected: 08/17/23 2258    Order Status: Completed Updated: 08/17/23 2302    Narrative:      HISTORY: Pneumonia     COMPARISON: 07/18/2023    FINDINGS:     Indwelling tubes and lines: Epicardial pacing wires.    Lungs: Clear lungs.    Pleura: No significant pleural effusion. No appreciable pneumothorax.    Mediastinum: Cardiomegaly. Post coronary artery stenting.     Bones: No acute or aggressive osseous abnormality. Right shoulder  arthroplasty.      Impression:         1.No acute cardiopulmonary process.  2.Cardiomegaly.    Marca Senna  08/17/2023 11:00 PM    US  Venous Dopp Left Low Extrem [[id number]] Collected: 08/17/23 2243    Order Status: Completed Updated: 08/17/23 2245    Narrative:      HISTORY: Lower extremity edema and pain.     COMPARISON: None.    TECHNIQUE: Martina Sledge scale, color flow, and spectral Doppler waveform analysis  was performed on the veins described below. There is normal  compressibility, phasic flow, and response to augmentation unless otherwise  noted.    FINDINGS:   Left lower extremity:    Common femoral vein: Normal  Greater saphenous vein at the saphenofemoral junction: Normal  Deep femoral vein (proximal portion): Normal  Femoral vein: Normal  Popliteal vein: Normal  Posterior tibial veins: Normal  Peroneal veins: Normal    The  right common femoral vein (imaged per protocol) is patent.      Impression:         No sonographic evidence for left lower extremity deep venous thrombosis.    Maeola Schmidt, DO  08/17/2023 10:43 PM             All recent brain and spine imaging (MRI, CT) personally reviewed.    Chart reviewed    Case discussed with: Patient, primary care team    TTS ; > Time spent examining patient, in counseling or coordination of care, reviewing test results, and in documentation. 50% time spent in counseling or coordination of care        This note was generated by the Epic EMR system/Speech recognition and may contain inherent errors or omissions not intended by the user. Grammatical errors, random word insertions, deletions and pronoun errors  are occasional consequences of this technology due to software limitations.   Not all errors are caught or corrected. If there are questions or concerns about the content of this note or information contained within the body of this dictation they should be addressed directly with the author for clarification.    Signed by: Sherel Dikes, MD,   Spectralink: [phone]      Answering Service: [phone]           [1]   Past Medical History:  Diagnosis Date    Diabetes mellitus (CMS/HCC)     Gastroesophageal reflux disease     Hyperlipidemia     Hypertension     Hypotension     Malignant tumor of breast (CMS/HCC)  04/13/2016    Seasonal allergic rhinitis     Syncope    [2]   Allergies  Allergen Reactions    Fentanyl Anaphylaxis     Hives    Percolone [Oxycodone]    [3]   Social History  Socioeconomic History    Marital status: Widowed   Tobacco Use    Smoking status: Former     Current packs/day: 0.00     Types: Cigarettes     Quit date: 1990     Years since quitting: 35.3    Smokeless tobacco: Never   Vaping Use    Vaping status: Never Used   Substance and Sexual Activity    Alcohol use: Not Currently     Comment: occasionally    Drug use: Never     Social Drivers of Product/process development scientist Strain: Low Risk  (07/17/2023)    Overall Financial Resource Strain (CARDIA)     Difficulty of Paying Living Expenses: Not hard at all   Food Insecurity: No Food Insecurity (08/18/2023)    Hunger Vital Sign     Worried About Running Out of Food in the Last Year: Never true     Ran Out of Food in the Last Year: Never true   Transportation Needs: No Transportation Needs (08/18/2023)    PRAPARE - Therapist, art (Medical): No     Lack of Transportation (Non-Medical): No   Intimate Partner Violence: Not At Risk (08/18/2023)    Humiliation, Afraid, Rape, and Kick questionnaire     Fear of Current or Ex-Partner: No     Emotionally Abused: No     Physically Abused: No     Sexually Abused: No   Housing Stability: Low Risk  (08/18/2023)    Housing Stability Vital Sign     Unable to Pay for Housing in the Last Year: No     Number of Times Moved in the Last Year: 1     Homeless in the Last Year: No   [4]   Family History  Problem Relation Name Age of Onset    Heart failure Mother      Myocardial Infarction Father      Breast cancer Sister

## 2023-08-19 ENCOUNTER — Encounter: Payer: Self-pay | Admitting: Internal Medicine

## 2023-08-19 DIAGNOSIS — R251 Tremor, unspecified: Secondary | ICD-10-CM

## 2023-08-19 MED ORDER — HYDRALAZINE HCL 25 MG PO TABS
25.0000 mg | ORAL_TABLET | Freq: Four times a day (QID) | ORAL | Status: DC | PRN
Start: 2023-08-19 — End: 2023-08-19
  Administered 2023-08-19: 25 mg via ORAL
  Filled 2023-08-19: qty 1

## 2023-08-19 NOTE — Progress Notes (Signed)
 This writer attempted to call ALF for nursing report but unsuccessful.

## 2023-08-19 NOTE — Discharge Instr - AVS First Page (Addendum)
 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

## 2023-08-19 NOTE — Progress Notes (Signed)
 This writer attempted to call ALF for nursing report thrice but unsuccessful.

## 2023-08-19 NOTE — Progress Notes (Signed)
 MMT pick up time changed to 5pm pick up.

## 2023-08-19 NOTE — Plan of Care (Signed)
 Problem: Pain interferes with ability to perform ADL  Goal: Pain at adequate level as identified by patient  Outcome: Progressing  Flowsheets (Taken 08/18/2023 1011)  Pain at adequate level as identified by patient:   Identify patient comfort function goal   Assess for risk of opioid induced respiratory depression, including snoring/sleep apnea. Alert healthcare team of risk factors identified.   Assess pain on admission, during daily assessment and/or before any "as needed" intervention(s)   Reassess pain within 30-60 minutes of any procedure/intervention, per Pain Assessment, Intervention, Reassessment (AIR) Cycle   Evaluate patient's satisfaction with pain management progress   Evaluate if patient comfort function goal is met   Offer non-pharmacological pain management interventions   Consult/collaborate with Physical Therapy, Occupational Therapy, and/or Speech Therapy   Include patient/patient care companion in decisions related to pain management as needed     Problem: Side Effects from Pain Analgesia  Goal: Patient will experience minimal side effects of analgesic therapy  Outcome: Progressing  Flowsheets (Taken 08/19/2023 1038)  Patient will experience minimal side effects of analgesic therapy:   Monitor/assess patient's respiratory status (RR depth, effort, breath sounds)   Assess for changes in cognitive function   Prevent/manage side effects per LIP orders (i.e. nausea, vomiting, pruritus, constipation, urinary retention, etc.)   Evaluate for opioid-induced sedation with appropriate assessment tool (i.e. POSS)     Problem: Moderate/High Fall Risk Score >5  Goal: Patient will remain free of falls  Outcome: Progressing  Flowsheets (Taken 08/18/2023 2200 by Zetang Epse Jua, [place], RN)  High (Greater than 13):   HIGH-Consider use of low bed   HIGH-Apply yellow "Fall Risk" arm band   HIGH-Bed alarm on at all times while patient in bed     Problem: Compromised Sensory Perception  Goal: Sensory Perception  Interventions  Outcome: Progressing  Flowsheets (Taken 08/19/2023 1038)  Sensory Perception Interventions: Offload heels, Pad bony prominences, Reposition q 2hrs/turn Clock, Q2 hour skin assessment under devices if present     Problem: Compromised Moisture  Goal: Moisture level Interventions  Outcome: Progressing  Flowsheets (Taken 08/19/2023 1038)  Moisture level Interventions: Moisture wicking products, Moisture barrier cream     Problem: Compromised Activity/Mobility  Goal: Activity/Mobility Interventions  Outcome: Progressing  Flowsheets (Taken 08/19/2023 1038)  Activity/Mobility Interventions: Pad bony prominences, TAP Seated positioning system when OOB, Promote PMP, Reposition q 2 hrs / turn clock, Offload heels     Problem: Compromised Nutrition  Goal: Nutrition Interventions  Outcome: Progressing  Flowsheets (Taken 08/19/2023 1038)  Nutrition Interventions: Discuss nutrition at Rounds, I&Os, Document % meal eaten, Daily weights     Problem: Compromised Friction/Shear  Goal: Friction and Shear Interventions  Outcome: Progressing  Flowsheets (Taken 08/19/2023 1038)  Friction and Shear Interventions: Pad bony prominences, Off load heels, HOB 30 degrees or less unless contraindicated, Consider: TAP seated positioning, Heel foams     Problem: Hemodynamic Status: Cardiac  Goal: Stable vital signs and fluid balance  Outcome: Progressing  Flowsheets (Taken 08/19/2023 1038)  Stable vital signs and fluid balance:   Assess signs and symptoms associated with cardiac rhythm changes   Monitor lab values     Problem: Safety  Goal: Patient will be free from injury during hospitalization  Outcome: Progressing  Flowsheets (Taken 08/19/2023 1038)  Patient will be free from injury during hospitalization:   Assess patient's risk for falls and implement fall prevention plan of care per policy   Use appropriate transfer methods   Hourly rounding   Ensure appropriate safety devices  are available at the bedside   Include patient/ family/  care giver in decisions related to safety   Provide and maintain safe environment   Assess for patients risk for elopement and implement Elopement Risk Plan per policy   Provide alternative method of communication if needed (communication boards, writing)  Goal: Patient will be free from infection during hospitalization  Outcome: Progressing  Flowsheets (Taken 08/19/2023 1038)  Free from Infection during hospitalization:   Assess and monitor for signs and symptoms of infection   Monitor lab/diagnostic results   Monitor all insertion sites (i.e. indwelling lines, tubes, urinary catheters, and drains)   Encourage patient and family to use good hand hygiene technique

## 2023-08-19 NOTE — Progress Notes (Signed)
 Discharge Note    Discharge to: [facility]     Family present during discharge:  none    Belonging:  taken with patient    Discharge education to: patient     Discharging equipments: none     Discharging medication: none ordered    IV: yes, removed    Discharged Via: stretcher to MMT by: transported back to facility by MMT transport     Discharge Note: Pt discharged to ALF. Pt. A/0x4 able to make needs known, on RA, VSS, no complaints of pain this shift. Pt denies dizziness, chest pain, SOB, and no respiratory distress noted.     This writer instructed patient on the importance of following up with PCP, take all medications as prescribed by MD, follow up with neurology outpatient, and monitor blood pressure before taking any medications. I went over the importance of wearing compression stockings especially when getting out of bed to help increase blood flow.     Pt left unit in stable condition via stretcher with all personal belongings and copy of AVS summary.     Report given to MMT transport in person and provided with all necessary paperwork.

## 2023-08-19 NOTE — Progress Notes (Addendum)
 Pt will [facility] to [facility]F room [phone]. Nursing number to give report is [phone] .Pt daughter aware and requested CM to request medical transport. CM also notified nurse Camilo Cella at ALF.     MMT confirmed pick up for 3:30pm.    [place] summary and AVS faxed to ALF f) [phone]      08/19/23 1411   Discharge Disposition   Patient preference/choice provided? Yes   Physical Discharge Disposition Assisted Living Facility   Name of Assisted Living Facility [facility]   Mode of Transportation Ambulance   Pick up time MMT pick up 3:30pm   Patient/Family/POA notified of transfer plan Yes   Patient agreeable to discharge plan/expected d/c date? Yes   Family/POA agreeable to discharge plan/expected d/c date? Yes   Bedside nurse notified of transport plan? Yes   Outpatient Services   Home Health Home PT/OT/ST  [facility] sent order to ALF)   CM Interventions   Follow up appointment scheduled?(For PNA, COPD, MI) No   Reason no follow up scheduled? Family to schedule   Multidisciplinary rounds/family meeting before d/c? Yes   Medicare Checklist   Is this a Medicare patient? Yes

## 2023-08-19 NOTE — Discharge Summary (Signed)
 Date:08/19/2023   Patient Name: Vanessa Kelly  Attending Physician: Darlynn Elam, MD  Today:   BP 150/53   Pulse 78   Temp 97.9 F (36.6 C) (Oral)   Resp 18   Ht 1.753 m (5\' 9" )   Wt 83.1 kg (183 lb 3.2 oz)   SpO2 100%   BMI 27.05 kg/m   Ranges for the last 24 hours:  Temp:  [97.5 F (36.4 C)-98.2 F (36.8 C)] 97.9 F (36.6 C)  Heart Rate:  [65-87] 78  Resp Rate:  [17-18] 18  BP: (90-192)/(51-93) 150/53    Date of Admission:   08/17/2023    Date of Discharge:    08/19/2023    Outcome of Hospitalization:   Principal Problem:    Tremor  Resolved Problems:    * No resolved hospital problems. *     Significant Events:     Patient presented with relatively acute onset of worsening treatments and difficulty ambulating.  Workup in the ER was unremarkable.  UA was negative for UTI.  Chest x-ray unremarkable.  CT head unremarkable.  Lab work unremarkable.  Symptoms resolved.  Patient evaluated by PT/OT and recommended for discharge with home PT/OT.  Patient was seen by neurology.  Recommended referral to tertiary care center like [facility]  for further eval.    Lab Results last 48 Hours       Procedure Component Value Units Date/Time    Urine Martina Sledge Culture Hold Tube [[id number]] Collected: 08/17/23 2218    Specimen: Urine, Clean Catch Updated: 08/18/23 0700     Extra Tube Hold for add-ons.    High Sensitivity Troponin-I with Delta [[id number]] Collected: 08/18/23 0320    Specimen: Blood, Venous Updated: 08/18/23 0413     hs Troponin 6.8 ng/L      hs Troponin-I Delta 0    Basic Metabolic Panel [[id number]]  (Abnormal) Collected: 08/18/23 0320    Specimen: Blood, Venous Updated: 08/18/23 0405     Glucose 124 mg/dL      BUN 18 mg/dL      Creatinine 1.3 mg/dL      Calcium  10.2 mg/dL      Sodium 578 mEq/L      Potassium 4.5 mEq/L      Chloride 106 mEq/L      CO2 23 mEq/L      Anion Gap 6.0     GFR 39.8 mL/min/1.73 m2     CBC with Differential (Order) [[id number]]  (Abnormal) Collected: 08/18/23 0320     Specimen: Blood, Venous Updated: 08/18/23 1324    Narrative:      The following orders were created for panel order CBC with Differential (Order).  Procedure                               Abnormality         Status                     ---------                               -----------         ------                     CBC with Differential (...[[id number]]  Abnormal            Final  result                 Please view results for these tests on the individual orders.    CBC with Differential (Component) [[id number]]  (Abnormal) Collected: 08/18/23 0320    Specimen: Blood, Venous Updated: 08/18/23 0337     WBC 7.00 x10 3/uL      Hemoglobin 9.5 g/dL      Hematocrit 29.5 %      Platelet Count 193 x10 3/uL      MPV 10.0 fL      RBC 2.75 x10 6/uL      MCV 98.2 fL      MCH 34.5 pg      MCHC 35.2 g/dL      RDW 14 %      nRBC % 0.0 /100 WBC      Absolute nRBC 0.00 x10 3/uL      Preliminary Absolute Neutrophil Count 3.12 x10 3/uL      Neutrophils % 44.6 %      Lymphocytes % 28.4 %      Monocytes % 7.7 %      Eosinophils % 18.6 %      Basophils % 0.6 %      Immature Granulocytes % 0.1 %      Absolute Neutrophils 3.12 x10 3/uL      Absolute Lymphocytes 1.99 x10 3/uL      Absolute Monocytes 0.54 x10 3/uL      Absolute Eosinophils 1.30 x10 3/uL      Absolute Basophils 0.04 x10 3/uL      Absolute Immature Granulocytes 0.01 x10 3/uL     Magnesium  [[id number]]  (Normal) Collected: 08/17/23 2337    Specimen: Blood, Venous Updated: 08/18/23 0201     Magnesium  1.9 mg/dL     Comprehensive Metabolic Panel [[id number]]  (Abnormal) Collected: 08/17/23 2337    Specimen: Blood, Venous Updated: 08/18/23 0007     Glucose 115 mg/dL      BUN 20 mg/dL      Creatinine 1.3 mg/dL      Sodium 132 mEq/L      Potassium 4.7 mEq/L      Chloride 103 mEq/L      CO2 25 mEq/L      Calcium  10.0 mg/dL      Anion Gap 6.0     GFR 39.8 mL/min/1.73 m2      AST (SGOT) 23 U/L      ALT 20 U/L      Alkaline Phosphatase 88 U/L      Albumin 3.5 g/dL      Protein,  Total 7.2 g/dL      Globulin 3.7 g/dL      Albumin/Globulin Ratio 0.9     Bilirubin, Total 0.3 mg/dL     NT-ProBNP [[id number]]  (Normal) Collected: 08/17/23 2141    Specimen: Blood, Venous Updated: 08/17/23 2302     NT-ProBNP 394 pg/mL     Urinalysis with Reflex to Microscopic Exam and Culture [[id number]]  (Normal) Collected: 08/17/23 2218    Specimen: Urine, Clean Catch Updated: 08/17/23 2247     Urine Color Straw     Urine Clarity Clear     Urine Specific Gravity 1.011     Urine pH 6.5     Urine Leukocyte Esterase Negative     Urine Nitrite Negative     Urine Protein Negative     Urine Glucose Negative     Urine Ketones Negative mg/dL  Urine Urobilinogen Normal mg/dL      Urine Bilirubin Negative     Urine Blood Negative    Narrative:      Urine Microscopic not indicated    COVID-19 and Influenza ([place]) (symptomatic) [[id number]]  (Normal) Collected: 08/17/23 2146    Specimen: Swab from Anterior Nares Updated: 08/17/23 2227     SARS-CoV-2 (COVID-19) RNA Not Detected     Influenza A RNA Not Detected     Influenza B RNA Not Detected    Narrative:      A result of "Detected" indicates POSITIVE for the presence of viral RNA  A result of "Not Detected" indicates NEGATIVE for the presence of viral RNA    Test performed using the Roche cobas Liat SARS-CoV-2 & Influenza A/B assay. This is a multiplex real-time RT-PCR assay for the detection of SARS-CoV-2, influenza A, and influenza B virus RNA. Viral nucleic acids may persist in vivo, independent of viability. Detection of viral nucleic acid does not imply the presence of infectious virus, or that virus nucleic acid is the cause of clinical symptoms. Negative results do not preclude SARS-CoV-2, influenza A, and/or influenza B infection and should not be used as the sole basis for diagnosis, treatment or other patient management decisions. Invalid results may be due to inhibiting substances in the specimen and recollection should occur.     High Sensitivity  Troponin-I [[id number]]  (Normal) Collected: 08/17/23 2141    Specimen: Blood, Venous Updated: 08/17/23 2217     hs Troponin 6.4 ng/L     CBC with Differential (Order) [[id number]]  (Abnormal) Collected: 08/17/23 2141    Specimen: Blood, Venous Updated: 08/17/23 2149    Narrative:      The following orders were created for panel order CBC with Differential (Order).  Procedure                               Abnormality         Status                     ---------                               -----------         ------                     CBC with Differential (...[[id number]]  Abnormal            Final result                 Please view results for these tests on the individual orders.    CBC with Differential (Component) [[id number]]  (Abnormal) Collected: 08/17/23 2141    Specimen: Blood, Venous Updated: 08/17/23 2149     WBC 7.51 x10 3/uL      Hemoglobin 9.7 g/dL      Hematocrit 44.0 %      Platelet Count 202 x10 3/uL      MPV 9.9 fL      RBC 2.88 x10 6/uL      MCV 98.6 fL      MCH 33.7 pg      MCHC 34.2 g/dL      RDW 14 %      nRBC % 0.0 /100 WBC      Absolute nRBC 0.00 x10 3/uL  Preliminary Absolute Neutrophil Count 3.66 x10 3/uL      Neutrophils % 48.7 %      Lymphocytes % 28.0 %      Monocytes % 6.9 %      Eosinophils % 15.4 %      Basophils % 0.7 %      Immature Granulocytes % 0.3 %      Absolute Neutrophils 3.66 x10 3/uL      Absolute Lymphocytes 2.10 x10 3/uL      Absolute Monocytes 0.52 x10 3/uL      Absolute Eosinophils 1.16 x10 3/uL      Absolute Basophils 0.05 x10 3/uL      Absolute Immature Granulocytes 0.02 x10 3/uL     Whole Blood Glucose POCT [[id number]]  (Normal) Collected: 08/17/23 2059    Specimen: Blood, Capillary Updated: 08/17/23 2101     Whole Blood Glucose POCT 91 mg/dL             Procedures performed:   Radiology: all results from this admission  CT Head WO Contrast  Result Date: 08/18/2023   1. No CT evidence of acute intracranial hemorrhage, herniation or hydrocephalus.   2. 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 Laurine Pore, MD 08/18/2023 10:05 AM    Chest AP Portable  Result Date: 08/17/2023   1.No acute cardiopulmonary process. 2.Cardiomegaly. Marca Senna 08/17/2023 11:00 PM    US  Venous Dopp Left Low Extrem  Result Date: 08/17/2023   No sonographic evidence for left lower extremity deep venous thrombosis. Maeola Schmidt, DO 08/17/2023 10:43 PM    Mammo Screening w/Tomo Bilateral  Result Date: 08/09/2023  No mammographic evidence of malignancy.   A LETTER WAS GENERATED TO THE PATIENT INDICATING THESE RESULTS.                                                             RECOMMENDATION:  SOCIETY OF BREAST IMAGING AND [employer] COLLEGE OF RADIOLOGY RECOMMENDATIONS FOR IMAGING SCREENING FOR BREAST CANCER  " Yearly screening mammogram from age 24* * for women at average risk of breast cancer. To see the full SBI/ACR recommendations go to http://www.mammographysaveslives.org/Facts/Guidelines BIRADS CATEGORY 02-BENIGN FINDING (2A) Felix Host, MD 08/09/2023 6:50 PM     Treatment Team:   Attending Provider: Darlynn Elam, MD  Consulting Physician: Rick Chard, MD    Disposition:   Disposition: Home or Self Care    Condition at Discharge:   stable     Follow up Recommendations for Receiving Provider     Unresulted Labs       None            Discharge Instructions:      Follow-up Information       Diakiwsky, Catherine E, NP Follow up in 1 week(s).    Specialty: Nurse Practitioner  Contact information:  [address]  [place] [address]  [phone]               Primary Neurologist Follow up in 1 week(s).                           Activity/Weight bearing status: as tolerated      Discharge Medication List  Taking      acetaminophen  325 MG tablet  Dose: 650 mg  Commonly known as: TYLENOL   Take 2 tablets (650 mg) by mouth every 6 (six) hours as needed for Pain (or headache)     albuterol  sulfate HFA 108 (90 Base) MCG/ACT inhaler  Commonly known as: PROVENTIL       aspirin  EC 81 MG EC tablet  Dose: 81 mg  Take 1 tablet (81 mg) by mouth daily     calcium  carbonate 500 MG chewable tablet  Dose: 1 tablet  Commonly known as: TUMS  Chew 1 tablet (500 mg) by mouth every 6 (six) hours as needed for Heartburn     candesartan 4 MG tablet  Dose: 4 mg  Commonly known as: ATACAND  Take 1 tablet (4 mg) by mouth daily as needed (SBP > 160)     Centrum Silver 50+Women Tabs  Dose: 1 tablet  Take 1 tablet by mouth daily     ergocalciferol  1.25 MG (50000 UT) capsule  Dose: 50,000 Units  Commonly known as: ERGOCALCIFEROL   Take 1 capsule (50,000 Units) by mouth once a week On Saturday     fexofenadine 180 MG tablet  Dose: 180 mg  Commonly known as: ALLEGRA  Take 1 tablet (180 mg) by mouth daily     Flonase  Sensimist 27.5 MCG/SPRAY nasal spray  Dose: 1 spray  Generic drug: fluticasone   1 spray by Nasal route daily     * lidocaine  5 %  Dose: 1 patch  Commonly known as: LIDODERM   Place 1 patch onto the skin every 24 hours Remove & Discard patch within 12 hours or as directed by MD     * lidocaine  5 %  Dose: 1 patch  Commonly known as: LIDODERM   Place 1 patch onto the skin in the morning. Remove & Discard patch within 12 hours or as directed by MD.     lovastatin 40 MG tablet  Dose: 40 mg  Commonly known as: MEVACOR  Take 1 tablet (40 mg) by mouth nightly     midodrine  5 MG tablet  Dose: 5 mg  Commonly known as: PROAMATINE   Take 1 tablet (5 mg) by mouth 3 (three) times daily As needed if SBP < 100 mm of HG     pantoprazole  40 MG tablet  Dose: 40 mg  Commonly known as: PROTONIX   Take 1 tablet (40 mg) by mouth nightly     polyethylene glycol 17 g packet  Dose: 17 g  Commonly known as: MIRALAX   Take 17 g by mouth daily     ranolazine  500 MG 12 hr tablet  Dose: 500 mg  Commonly known as: RANEXA   Take 1 tablet (500 mg) by mouth 2 (two) times daily     sucralfate  1 g tablet  Dose: 1 g  Commonly known as: CARAFATE   Take 1 tablet (1 g) by mouth every 6 (six) hours as needed (abdominal pain)     traMADol  50  MG tablet  Dose: 50 mg  Commonly known as: ULTRAM   Take 1 tablet (50 mg) by mouth every 8 (eight) hours as needed for Pain     venlafaxine  37.5 MG 24 hr capsule  Dose: 75 mg  Commonly known as: EFFEXOR -XR  Take 2 capsules (75 mg) by mouth nightly           * This list has 2 medication(s) that are the same as other medications prescribed for you. Read the directions carefully, and ask your doctor or  other care provider to review them with you.                Unreviewed. Pend note and finish med rec. Then refresh the note.      meclizine  12.5 MG tablet  Dose: 25 mg  Commonly known as: ANTIVERT   Take 2 tablets (25 mg) by mouth 3 (three) times daily as needed for Dizziness     * ondansetron  4 MG disintegrating tablet  Dose: 4 mg  Commonly known as: ZOFRAN -ODT  Take 1 tablet (4 mg) by mouth every 6 (six) hours as needed for Nausea     * ondansetron  4 MG disintegrating tablet  Dose: 4 mg  Commonly known as: ZOFRAN -ODT  Take 1 tablet (4 mg) by mouth every 6 (six) hours as needed for Nausea           * This list has 2 medication(s) that are the same as other medications prescribed for you. Read the directions carefully, and ask your doctor or other care provider to review them with you.                  Signed by: Darlynn Elam, MD

## 2023-08-19 NOTE — PT Eval Note (Addendum)
 Physical Therapy Evaluation  Vanessa Kelly      Post Acute Care Therapy Recommendations:     Discharge Recommendations:  Home with supervision, Home with home health PT    If Home with supervision, Home with home health PT  recommended discharge disposition is not available, patient will need CG assist for ambulation and SNF.     DME needs IF patient is discharging home: No additional equipment/DME recommended at this time    Therapy discharge recommendations may change with patient status.  Please refer to most recent note for up-to-date recommendations.         [facility]  [address]  [place], [place] [address]  [phone]    Physical Therapy Evaluation    Patient: Vanessa Kelly MRN: [id number]   Unit: [facility] INTERMEDIATE CARE Bed: MI623/MI623-02    Time of Treatment:   PT Received On: 08/19/23  Start Time: 1105  Stop Time: 1142  Time Calculation (min): 37 min       Consult received for Vanessa Kelly for PT evaluation and treatment.  Patient's medical condition is appropriate for Physical Therapy  intervention at this time.    Interpreter utilized: no, not indicated      Assessment   Vanessa Kelly is a 83 y.o. female admitted 08/17/2023 with worsening tremors and weakness. CT imaging is negative for acute abnormality. PMHx of L LE pain, Parkinsonism, labile BP, CAD s/p bypass and stent, HLD, GERD, DM2.     At baseline, pt lives in [facility], is mod Indep in [employer] and ambulates with a rollator to the dining hall for meals. Wears knee-high compression stockings habitually, and has RW, W/C, shower bench and BP monitor. Pt states she has assistance for showering and mobility if she requests it, and is scheduled to resume PT services upon return to ALF. Pt presents with good cognition and LE ROM/strength, is modified indep with bed mobility, CG for transfers and needs CG for ambulation with RW 3 steps. Pt limited by c/o lightheadedness and BP drop with vertical positioning.  Current status warrants continued PT in supervised setting to maximize functional independence, given pt is below their baseline functional status. Consistent BP monitoring with mobility recommended.    Pt's functional mobility is impacted by:  decreased activity tolerance, decreased balance, abnormal blood pressure, dizziness/vertigo, gait impairment, and transfers .  There are a few comorbidities or other factors that affect plan of care and require modification of task including: assistive device needed for mobility, frequent falls, residual neurological symptoms, and lives alone.  Standardized tests and exams incorporated into evaluation include AMPAC mobility, balance, cognition/orientation, coordination, ROM , and Strength.  Pt demonstrates a evolving clinical presentation due to abnormal vital sign response to activity.   Pt would continue to benefit from PT to address these deficits and increase functional independence.     Complexity Level Hx and Co  morbidites Examination Clinical Decision Making Clinical Presentation   Moderate   1-2 factors 3 or more   Several options Evolving, plan may alter     PMP - Progressive Mobility Protocol   PMP Activity: Step 6 - Walks in Room  Distance Walked (ft) (Step 6,7): 3 Feet       Rehabilitation Potential: good with adequate BP control       Interdisciplinary Communication: Nursing made aware of session outcome.    Plan     Plan  Risks/Benefits/POC Discussed with Pt/Family: With patient  Treatment/Interventions: Exercise;Gait  training;Functional transfer training;LE strengthening/ROM;Endurance training;Patient/family training;Compensatory technique education  PT Frequency: 3-4x/wk         Medical Diagnosis: Tremor [R25.1]  Hypertension, unspecified type [I10]    History of Present Illness: Vanessa Kelly is a 83 y.o. female admitted on  08/17/2023 with "Generalized weakness and tremor."    The patient, who resides in an assisted living facility, presents with  worsening tremors and weakness. She reports that while sitting outside under a gazebo, she fell asleep and woke up to find that her body was shaking uncontrollably. She had difficulty holding utensils and standing, and her legs were shaking. 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 She has been eating well and has not experienced any headaches or confusion. She reports a headache at the time of the visit but attributes it to the tremors. She denies any chest pain or shortness of breath. She reports some pain on both sides of her abdomen, which she attributes to a recent urinary tract infection (UTI). She also reports leg pain and discoloration, particularly in the leg where a vein was removed for a previous triple bypass surgery. She has not taken her blood pressure medication and her blood pressure is high.        Independent Historian (other than patient): EMS and Family (list in HPI)  Additional History Provided by Independent Historian: Ambulance 409 arrived to [address] to find a 83 year old female lying in her bed. Patient complaint of general weakness. Patient related she was having a good day enjoying the weather fell asleep outside and her neighbor woke her up. She stated she went to get something to eat and started to feel weak. She was assisted to her room where she laid down. Nursing home staff took her blood pressure and it was elevated. Patient decided to call 911 to be transported to the hospital. Patient's vitals were taken at her side and her blood pressure was elevated slightly. Patient stated she would still like to be transported to the hospital to be evaluated. Patient was placed on stretcher and assisted to ambulance 409"  Emergency medicine note    Problem List[1]  Medical History[2]  Past Surgical History[3]    Tests/Labs:  Lab Results   Component Value Date/Time    HGB 9.5 (L) 08/18/2023 03:20 AM    HGB 10.4 (L) 06/28/2022 06:18 AM    HCT 27.0 (L) 08/18/2023 03:20 AM    HCT  31.8 (L) 06/28/2022 06:18 AM    K 4.5 08/18/2023 03:20 AM    K 4.5 06/28/2022 06:18 AM    NA 135 08/18/2023 03:20 AM    NA 140 06/28/2022 06:18 AM    INR 1.2 07/15/2023 01:04 PM    TROPI 6.8 08/18/2023 03:20 AM    TROPI 6.4 08/17/2023 09:41 PM    TROPI 13.9 07/15/2023 04:17 PM    TROPI 18.6 (H) 07/15/2023 01:04 PM    TROPI 6.7 06/23/2022 06:41 PM    TROPI <2.7 01/18/2022 05:15 PM    TROPI <0.01 01/21/2021 02:30 PM     Imaging   CT Head WO Contrast  Result Date: 08/18/2023   1. No CT evidence of acute intracranial hemorrhage, herniation or hydrocephalus.   2. Cerebral volume loss and sequela of chronic ischemic disease. Laurine Pore, MD 08/18/2023 10:05 AM    Chest AP Portable  Result Date: 08/17/2023   1.No acute cardiopulmonary process. 2.Cardiomegaly. Marca Senna 08/17/2023 11:00 PM    US  Venous Dopp Left Low Extrem  Result Date:  08/17/2023   No sonographic evidence for left lower extremity deep venous thrombosis. Maeola Schmidt, DO 08/17/2023 10:43 PM      Social History: (Per family/care provider, if patient is not able to give accurate report)  "Lives at [facility] in an apartment.  Entry Steps: 0             Inside steps: elevator access/use    Equipment at home:  rollator, walk in shower, grab bars in shower, shower chair, raised toilet seat, and grab bar around toilet area  Prior Level of Function:  light meal prep; eats all meals in dining hall (~40-50 ft to/from elevator to/from apartment and dining hall). Pt reports she attends activities in the community at times              Cognition: [facility]                         Mobility/Locomotion: modI with rollator              Feeding: independent              Grooming: independent               Bathing: modI with shower chair              Dressing: independent               Toileting: independent; uses external catheter home system at night"    Subjective     Patient is agreeable to participation in the therapy session. Nursing clears patient for therapy.  Patient's  Goal:  To feel stronger  Pain: pt denies pain    Objective     Precautions/ Contraindications:   Precautions  Weight Bearing Status: no restrictions  Other Precautions: falls, Orthostatic BP    Patient is in bed with telemetry, PrimaFit (external female catheter), and intravenous access in place.       Observation of patient/vitals:    VITALS Assessment with Activity:   Supine Seated (w/LE AROM) Standing   HR (bpm) 77 87 100   BP (mmHg) 109/70 120/57 109/57   SpO2 (%) 94 99 98        Vitals:    08/19/23 0415 08/19/23 0643 08/19/23 0811 08/19/23 1002   BP: 172/72 (!) 168/91 192/69 150/53   Pulse: 71 77 73 78   Resp: 18  18    Temp: 97.5 F (36.4 C)  97.9 F (36.6 C)    TempSrc: Oral  Oral    SpO2: 98% 97% 100%    Weight:       Height:           Orientation/Cognition:  Alert and Oriented x 4  Cognition: Pt follows commands well.    Musculoskeletal Examination: AROM WNL and 5/5 strength for BLES    Sensation: Intact to light touch, localization, proprioception throughout.  Coordination: good, no tremor noted on eval    Functional Mobility:  Rolling: indep with additional time    Supine to sit: mod I  Scooting: mod I  Sit to Supine: mod I with additional time  Sit to stand: CG/cues  Stand to sit: CG/cues  Transfers: on/off EOB x 4 reps  W/C Mobility: NT  Ambulation:     Weightbearing: FWB   Assistance level: CG   Distance: 3 lateral steps and 3 forward steps   Assistive Device: RW   Gait Deviations: decr clearance and cadence,  forward flexed trunk,    Stairs: NT/NA    Balance:  Static Sit: good  Dynamic Sit: good  Static Stand: fair-  Dynamic Stand: poor    Endurance: poor with c/o lightheadedness with standing without and with TEDs placed    Participation:  excellent    Education:  Educated the patient to role of physical therapy, plan of care, goals  of therapy, rationale for progressing mobility and HEP, safety with mobility and ADLs, energy conservation techniques, and home safety.    RN notified of session  outcome and that patient was left in bed with all needs met and equipment intact.   Safety measures include: handoff to nurse/clin tech/ unit secretary completed, oriented to call bell and placed within reach, personal items within reach, assistive device positioned out of reach, and bed placed in lowest position.   Mobility and ADL status posted at bedside and within E.M.R.    AM-PACT Inpatient Short Forms  Inpatient AM-PACT Performed? (PT): Basic Mobility Inpatient Short Form   AM-PACT "6 Clicks" Basic Mobility Inpatient Short Form  Turning Over in Bed: None  Sitting Down On/Standing From Armchair: A little  Lying on Back to Sitting on Side of Bed: None  Assist Moving to/from Bed to Chair: A little  Assist to Walk in Hospital Room: A little  Assist to Climb 3-5 Steps with Railing: Total  PT Basic Mobility Raw Score: 18  CMS 0-100% Score: 46.58%      Goals  Goal Formulation: With patient  Time for Goal Acheivement: 7 visits  Pt Will Perform Sit to Stand: modified independent  Pt Will Transfer Bed/Chair: with rolling walker;modified independent  Pt Will Ambulate: 31-50 feet;with rolling walker;with supervision  Pt Will Perform Home Exer Program: with supervision;to maximize functional mobility and independence;by time of discharge       Therapist PPE during session gloves     Signature:  Freya Jesus, PT  08/19/2023  11:53 AM       (For scheduling questions, please contact rehab tech x [phone])              [1]   Patient Active Problem List  Diagnosis    Headache    Mastoiditis    Dizziness    Altered mental status    Tremor    Adrenal cortical hypofunction    Anemia    Atherosclerotic heart disease of native coronary artery without angina pectoris    DM2 (diabetes mellitus, type 2) (CMS/HCC)    Essential hypertension    Fall    Gastroesophageal reflux disease without esophagitis    Carotid artery disease    Malignant tumor of breast (CMS/HCC)    Mixed hyperlipidemia    Parkinson's disease (CMS/HCC)     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

## 2023-08-24 NOTE — Progress Notes (Signed)
 Name: Vanessa Kelly    ### Patient Details  Date of Birth: [birth year]  MRN: [id number]    ### Encounter Details  Arrival Date: 08/17/2023 09:07 PM EDT  Discharge Date: 08/19/2023 05:28 PM EDT  Encounter ID: [id number] TCM4/19/2025   5:28:00PM    ### Related interaction  [facility] - TCM V2 Post Discharge Outreach (Post Discharge TCM V2 Outreach 1) ([email or web address]    ### Required Interventions and Feedback     Call Status         Call Status:     No Attempt (edited by TF on 08/24/2023 10:17 AM EDT)    Comments::     Patient received an automated post-discharge outreach call. Call attempt incomplete. No further action is needed at this time. (edited by TF on 08/24/2023 10:18 AM EDT)    Dolphus Friday) [facility], Ambulatory Care Management  [facility]   [address]  [place], [place] [address]  Deanna Expose [phone]

## 2023-09-20 ENCOUNTER — Other Ambulatory Visit: Payer: Self-pay | Admitting: Urology

## 2023-09-20 DIAGNOSIS — N390 Urinary tract infection, site not specified: Secondary | ICD-10-CM

## 2023-09-26 ENCOUNTER — Ambulatory Visit

## 2023-09-26 ENCOUNTER — Ambulatory Visit
Admission: RE | Admit: 2023-09-26 | Discharge: 2023-09-26 | Disposition: A | Discharge status: Home / Self Care | Source: Ambulatory Visit | Attending: Urology | Admitting: Urology

## 2023-09-26 DIAGNOSIS — N390 Urinary tract infection, site not specified: Secondary | ICD-10-CM

## 2023-09-28 ENCOUNTER — Ambulatory Visit: Attending: Urology

## 2023-09-28 DIAGNOSIS — N390 Urinary tract infection, site not specified: Secondary | ICD-10-CM | POA: Insufficient documentation

## 2023-09-28 LAB — LAB USE ONLY - URINALYSIS WITH REFLEX TO MICROSCOPIC EXAM AND CULTURE
Urine Bilirubin: NEGATIVE
Urine Blood: NEGATIVE
Urine Glucose: NEGATIVE
Urine Ketones: NEGATIVE mg/dL
Urine Leukocyte Esterase: NEGATIVE
Urine Nitrite: NEGATIVE
Urine Protein: NEGATIVE
Urine Specific Gravity: 1.011 (ref 1.001–1.035)
Urine Urobilinogen: NORMAL mg/dL (ref 0.2–2.0)
Urine pH: 6.5 (ref 5.0–8.0)

## 2023-09-29 LAB — CULTURE, URINE: Culture Urine: NO GROWTH

## 2023-10-11 NOTE — Provider Clarification Note (Signed)
 Patient Name: Vanessa Kelly, Vanessa Kelly  Account #: [id number]   MR #: [id number]  Discharge Date: 08/19/2023          Thank you for responding!    Documentation Query sent by: Adrien Rudd  Date:  10/10/2023        PROVIDER RESPONSE Insert a query response from the list above or add free text:    Tremor, no known cause

## 2023-10-14 ENCOUNTER — Emergency Department

## 2023-10-14 ENCOUNTER — Inpatient Hospital Stay
Admission: EM | Admit: 2023-10-14 | Discharge: 2023-10-19 | DRG: 086 | Disposition: A | Discharge status: Skilled Nursing Facility | Attending: Internal Medicine | Admitting: Internal Medicine

## 2023-10-14 DIAGNOSIS — Z87891 Personal history of nicotine dependence: Secondary | ICD-10-CM

## 2023-10-14 DIAGNOSIS — E871 Hypo-osmolality and hyponatremia: Secondary | ICD-10-CM | POA: Diagnosis present

## 2023-10-14 DIAGNOSIS — I2510 Atherosclerotic heart disease of native coronary artery without angina pectoris: Secondary | ICD-10-CM | POA: Diagnosis present

## 2023-10-14 DIAGNOSIS — Z853 Personal history of malignant neoplasm of breast: Secondary | ICD-10-CM

## 2023-10-14 DIAGNOSIS — S0181XA Laceration without foreign body of other part of head, initial encounter: Secondary | ICD-10-CM

## 2023-10-14 DIAGNOSIS — R296 Repeated falls: Secondary | ICD-10-CM | POA: Diagnosis present

## 2023-10-14 DIAGNOSIS — S0990XA Unspecified injury of head, initial encounter: Secondary | ICD-10-CM | POA: Diagnosis present

## 2023-10-14 DIAGNOSIS — E785 Hyperlipidemia, unspecified: Secondary | ICD-10-CM | POA: Diagnosis present

## 2023-10-14 DIAGNOSIS — K219 Gastro-esophageal reflux disease without esophagitis: Secondary | ICD-10-CM | POA: Diagnosis present

## 2023-10-14 DIAGNOSIS — D649 Anemia, unspecified: Secondary | ICD-10-CM | POA: Diagnosis present

## 2023-10-14 DIAGNOSIS — Z951 Presence of aortocoronary bypass graft: Secondary | ICD-10-CM

## 2023-10-14 DIAGNOSIS — W010XXA Fall on same level from slipping, tripping and stumbling without subsequent striking against object, initial encounter: Secondary | ICD-10-CM | POA: Diagnosis present

## 2023-10-14 DIAGNOSIS — S01111A Laceration without foreign body of right eyelid and periocular area, initial encounter: Secondary | ICD-10-CM | POA: Diagnosis present

## 2023-10-14 DIAGNOSIS — T797XXA Traumatic subcutaneous emphysema, initial encounter: Secondary | ICD-10-CM | POA: Diagnosis present

## 2023-10-14 DIAGNOSIS — Y92129 Unspecified place in nursing home as the place of occurrence of the external cause: Secondary | ICD-10-CM

## 2023-10-14 DIAGNOSIS — Z79899 Other long term (current) drug therapy: Secondary | ICD-10-CM

## 2023-10-14 DIAGNOSIS — R040 Epistaxis: Secondary | ICD-10-CM | POA: Diagnosis present

## 2023-10-14 DIAGNOSIS — I129 Hypertensive chronic kidney disease with stage 1 through stage 4 chronic kidney disease, or unspecified chronic kidney disease: Secondary | ICD-10-CM | POA: Diagnosis present

## 2023-10-14 DIAGNOSIS — S0285XA Fracture of orbit, unspecified, initial encounter for closed fracture: Secondary | ICD-10-CM

## 2023-10-14 DIAGNOSIS — I951 Orthostatic hypotension: Secondary | ICD-10-CM | POA: Diagnosis present

## 2023-10-14 DIAGNOSIS — S02832A Fracture of medial orbital wall, left side, initial encounter for closed fracture: Principal | ICD-10-CM | POA: Diagnosis present

## 2023-10-14 DIAGNOSIS — N183 Chronic kidney disease, stage 3 unspecified: Secondary | ICD-10-CM | POA: Diagnosis present

## 2023-10-14 DIAGNOSIS — E1122 Type 2 diabetes mellitus with diabetic chronic kidney disease: Secondary | ICD-10-CM | POA: Diagnosis present

## 2023-10-14 DIAGNOSIS — I10 Essential (primary) hypertension: Secondary | ICD-10-CM

## 2023-10-14 DIAGNOSIS — Z7982 Long term (current) use of aspirin: Secondary | ICD-10-CM

## 2023-10-14 DIAGNOSIS — N179 Acute kidney failure, unspecified: Secondary | ICD-10-CM | POA: Diagnosis present

## 2023-10-14 LAB — LAB USE ONLY - CBC WITH DIFFERENTIAL
Absolute Basophils: 0.03 x10 3/uL (ref 0.00–0.08)
Absolute Eosinophils: 0.6 x10 3/uL — ABNORMAL HIGH (ref 0.00–0.44)
Absolute Immature Granulocytes: 0.02 x10 3/uL (ref 0.00–0.07)
Absolute Lymphocytes: 1.4 x10 3/uL (ref 0.42–3.22)
Absolute Monocytes: 0.57 x10 3/uL (ref 0.21–0.85)
Absolute Neutrophils: 4.97 x10 3/uL (ref 1.10–6.33)
Absolute nRBC: 0 x10 3/uL (ref <=0.00)
Basophils %: 0.4 %
Eosinophils %: 7.9 %
Hematocrit: 27.8 % — ABNORMAL LOW (ref 34.7–43.7)
Hemoglobin: 9.1 g/dL — ABNORMAL LOW (ref 11.4–14.8)
Immature Granulocytes %: 0.3 %
Lymphocytes %: 18.4 %
MCH: 32.5 pg (ref 25.1–33.5)
MCHC: 32.7 g/dL (ref 31.5–35.8)
MCV: 99.3 fL — ABNORMAL HIGH (ref 78.0–96.0)
MPV: 10 fL (ref 8.9–12.5)
Monocytes %: 7.5 %
Neutrophils %: 65.5 %
Platelet Count: 190 x10 3/uL (ref 142–346)
Preliminary Absolute Neutrophil Count: 4.97 x10 3/uL (ref 1.10–6.33)
RBC: 2.8 x10 6/uL — ABNORMAL LOW (ref 3.90–5.10)
RDW: 13 % (ref 11–15)
WBC: 7.59 x10 3/uL (ref 3.10–9.50)
nRBC %: 0 /100{WBCs} (ref <=0.0)

## 2023-10-14 LAB — COMPREHENSIVE METABOLIC PANEL
ALT: 28 U/L (ref <=55)
AST (SGOT): 27 U/L (ref <=41)
Albumin/Globulin Ratio: 1 (ref 0.9–2.2)
Albumin: 3.3 g/dL — ABNORMAL LOW (ref 3.5–5.0)
Alkaline Phosphatase: 90 U/L (ref 37–117)
Anion Gap: 6 (ref 5.0–15.0)
BUN: 29 mg/dL — ABNORMAL HIGH (ref 7–21)
Bilirubin, Total: 0.3 mg/dL (ref 0.2–1.2)
CO2: 23 meq/L (ref 17–29)
Calcium: 9.7 mg/dL (ref 7.9–10.2)
Chloride: 104 meq/L (ref 99–111)
Creatinine: 1.5 mg/dL — ABNORMAL HIGH (ref 0.4–1.0)
GFR: 33.5 mL/min/1.73 m2 — ABNORMAL LOW (ref >=60.0)
Globulin: 3.3 g/dL (ref 2.0–3.6)
Glucose: 166 mg/dL — ABNORMAL HIGH (ref 70–100)
Potassium: 5 meq/L (ref 3.5–5.3)
Protein, Total: 6.6 g/dL (ref 6.0–8.3)
Sodium: 133 meq/L — ABNORMAL LOW (ref 135–145)

## 2023-10-14 MED ORDER — TRANEXAMIC ACID 1000 MG/10 ML TOPICAL SOLN
10.0000 mL | Freq: Once | TOPICAL | Status: AC
Start: 2023-10-14 — End: 2023-10-15
  Administered 2023-10-15: 10 mL via TOPICAL
  Filled 2023-10-14: qty 10

## 2023-10-14 MED ORDER — OXYMETAZOLINE HCL 0.05 % NA SOLN
2.0000 | Freq: Once | NASAL | Status: AC
Start: 2023-10-14 — End: 2023-10-14
  Administered 2023-10-14: 2 via NASAL
  Filled 2023-10-14: qty 15

## 2023-10-14 MED ORDER — ACETAMINOPHEN 500 MG PO TABS
1000.0000 mg | ORAL_TABLET | Freq: Once | ORAL | Status: AC
Start: 2023-10-14 — End: 2023-10-14
  Administered 2023-10-14: 1000 mg via ORAL
  Filled 2023-10-14: qty 2

## 2023-10-14 MED ORDER — HYDRALAZINE HCL 20 MG/ML IJ SOLN
10.0000 mg | Freq: Once | INTRAMUSCULAR | Status: AC
Start: 2023-10-14 — End: 2023-10-15
  Administered 2023-10-15: 10 mg via INTRAVENOUS
  Filled 2023-10-14: qty 1

## 2023-10-14 MED ORDER — TETRACAINE HCL 0.5 % OP SOLN
2.0000 [drp] | Freq: Once | OPHTHALMIC | Status: AC
Start: 2023-10-14 — End: 2023-10-14
  Administered 2023-10-14: 2 [drp] via OPHTHALMIC
  Filled 2023-10-14: qty 4

## 2023-10-14 MED ORDER — SODIUM CHLORIDE 0.9 % IV SOLN
INTRAVENOUS | Status: AC
Start: 2023-10-14 — End: 2023-10-15
  Filled 2023-10-14: qty 1000

## 2023-10-14 MED ORDER — LIDOCAINE-EPINEPHRINE 1 %-1:100000 IJ SOLN
1.0000 mL | Freq: Once | INTRAMUSCULAR | Status: AC
Start: 2023-10-14 — End: 2023-10-14
  Administered 2023-10-14: 1 mL via INTRADERMAL
  Filled 2023-10-14: qty 20

## 2023-10-14 NOTE — ED Notes (Signed)
 Bed: E06  Expected date:   Expected time:   Means of arrival:   Comments:  [facility]

## 2023-10-14 NOTE — ED Provider Notes (Signed)
 Vanessa Kelly  Emergency Department APP Note      Diagnosis/Disposition     ED Disposition:  Observation    ED Diagnosis:     Injury of head, initial encounter  Facial laceration, initial encounter  Closed fracture of orbit, initial encounter (CMS/HCC)  Hypertension, unspecified type    Current Discharge Medication List          History of Present Illness      Chief Complaint: Laceration, Head Injury, Fall, and Hip Pain     83 y.o. female with past medical history as below   History of Present Illness  Vanessa Kelly is an 83 year old female who presents with a fall resulting in facial and hip pain.    She attempted to stand using her rollator and felt shaky and disconnected, leading to a loss of balance and a fall onto her face. She is unsure about losing consciousness but thinks it might have been brief. She was unable to walk immediately after the fall and feels detached, expressing concern about falling again. She holds onto objects to prevent another fall.    She experiences hip pain, particularly when trying to move the leg, but there is no pain with pressure on the hip or knee bending. She has difficulty moving the leg due to the hip pain.    She has persistent epistaxis that is difficult to control. The noise from the fall alarmed those around her.    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 She is currently taking aspirin .    Independent Historian (other than patient): No  Additional History Provided by Independent Historian:      Physical Exam     ED Triage Vitals [10/14/23 1857]   Encounter Vitals Group      BP 198/80      Systolic BP Percentile       Diastolic BP Percentile       Heart Rate 75      Resp Rate 20      Temp 98.1 F (36.7 Kelly)      Temp src Oral      SpO2 96 %      Weight 86 kg      Height 1.753 m      Head Circumference       Peak Flow       Pain Score 7      Pain Loc       Pain Education        Exclude from Growth Chart       Physical Exam  Vitals and nursing note reviewed.   HENT:      Head: Normocephalic. Contusion and laceration present. No raccoon eyes or Battle's sign.      Jaw: There is normal jaw occlusion. No trismus.        Right Ear: External ear normal.      Left Ear: External ear normal.      Nose: Signs of injury present. No nasal deformity or septal deviation.      Right Nostril: Epistaxis present. No septal hematoma.      Left Nostril: No septal hematoma.      Mouth/Throat:      Lips: Pink.      Mouth: Mucous membranes are moist.   Eyes:      General: Lids are normal.   Pulmonary:      Effort: Pulmonary  effort is normal.      Breath sounds: Normal air entry.   Musculoskeletal:      Cervical back: Normal, normal range of motion and neck supple. No swelling, deformity, rigidity or tenderness. No spinous process tenderness.      Thoracic back: Normal. No swelling, deformity or tenderness.      Lumbar back: Normal. No swelling, deformity or tenderness.   Skin:     General: Skin is warm and dry.      Capillary Refill: Capillary refill takes less than 2 seconds.   Neurological:      Mental Status: She is alert and oriented to person, place, and time.      GCS: GCS eye subscore is 4. GCS verbal subscore is 5. GCS motor subscore is 6.        Physical Exam         Medical Decision Making        PRIMARY PROBLEM LIST      1. Acute illness/injury DIAGNOSIS:Head Injury    Chronic Illness Impacting Care of the above problem: Advanced age Increases complexity of evaluation, Increases the risk of severe disease, Increase the risk of disease progression, and Increases the risk of poor wound healing   Differential Diagnosis: Head injury: contusion, Concussion, ICH, Abrasion     DISCUSSION      Assessment & Plan  Fall with facial injury  Possible brief loss of consciousness. No fractures identified. Aspirin  use may exacerbate bleeding.  - Order CT scan of head to assess for internal  injuries.    Epistaxis  Bleeding controlled with manual pressure and clot removal. Further assessment needed to determine the source of bleeding.  - Consider discontinuation of aspirin  if bleeding persists.    Hip pain  Pain likely due to impact against the desk. No fractures identified.  - Order CT scan of hip to evaluate for fractures or internal injuries.  - Provide pain management as needed.    Neck pain  Pain possibly related to the fall. No specific injury identified.  - Order CT scan of neck to assess for internal injuries.    Dr. Jennette on call ophthalmologist advised no need for transfer. Recommended daily vision checks and IOP checks.     Discussed case with ED attending, Dr. Marnell, who is agreeable to the plan of care, treatment and disposition.    The patient was admitted and handed off to Dr. Sherida. We discussed all aspects of the work-up that had been completed in the ED, any pending studies/therapies, and all clinical decision making at the time care was handed off.     Additional Notes      External Records Reviewed?: Inpatient Records      Was management discussed with a consultant?: Yes (explain) Ophthalmology                               Vital Signs: Reviewed the patient's vital signs.   Nursing Notes: Reviewed and utilized available nursing notes.   Medical Records Reviewed: Reviewed available past medical records.   Counseling: The emergency provider has spoken with the patient and discussed today 's findings, in addition to providing specific details for the plan of care. Questions are answered and there is agreement with the plan.     CRITICAL CARE/PROCEDURES    Lac Repair    Date/Time: 10/14/2023 11:41 PM    Performed by: Vanessa Belongia C, PA  Authorized  by: Vanessa Kate SQUIBB, MD    Consent:     Consent obtained:  Verbal    Consent given by:  Patient    Risks, benefits, and alternatives were discussed: yes      Risks discussed:  Infection, need for additional repair, nerve damage,  poor wound healing, poor cosmetic result, pain, retained foreign body, tendon damage and vascular damage    Alternatives discussed:  Referral and delayed treatment  Universal protocol:     Patient identity confirmed:  Verbally with patient  Anesthesia:     Anesthesia method:  Local infiltration    Local anesthetic:  Lidocaine  1% WITH epi  Laceration details:     Location:  Face    Face location:  R eyebrow    Length (cm):  2    Depth (mm):  2  Exploration:     Limited defect created (wound extended): no      Hemostasis achieved with:  Direct pressure    Imaging obtained: x-ray      Imaging outcome: foreign body not noted      Wound exploration: wound explored through full range of motion and entire depth of wound visualized      Wound extent: no foreign bodies/material noted, no muscle damage noted, no underlying fracture noted and no vascular damage noted      Contaminated: no    Treatment:     Area cleansed with:  Saline    Amount of cleaning:  Standard    Irrigation solution:  Sterile saline    Irrigation volume:  50cc    Irrigation method:  Pressure wash    Debridement:  None    Undermining:  None    Scar revision: no    Skin repair:     Repair method:  Sutures    Suture size:  6-0    Suture material:  Prolene    Suture technique:  Simple interrupted    Number of sutures:  6  Approximation:     Approximation:  Close  Repair type:     Repair type:  Simple  Post-procedure details:     Dressing:  Open (no dressing)    Procedure completion:  Tolerated well, no immediate complications           CARDIAC STUDIES      The following cardiac studies were independently interpreted by me the Emergency Medicine Provider. For full cardiac study results please see chart.                                                                   EMERGENCY IMAGING STUDIES      The following imaging studies were independently interpreted by me (emergency medicine provider):                  CT Head Interpreted by me (ED Provider)    Comparison: None available   RESULT: No Hemorrhage or Mass Effect   IMPRESSION: No acute abnormality            Supplemental Encounter Data   Medical History[7]  Past Surgical History[8]  Social History[9]  Family History[10]  Allergies[11]    Encounter Orders:  Orders Placed This Encounter   Procedures    Laceration repair  CT Cervical Spine without Contrast    CT Head without Contrast    CT Sinus Facial Bones without Contrast    CT Hip Right WO Contrast    CBC with Differential (Order)    Comprehensive Metabolic Panel    Urine Elnor Culture Hold Tube    CBC with Differential (Component)    Basic Metabolic Panel    CBC with Differential (Order)    Magnesium     Phosphorus    Urinalysis with Reflex to Microscopic Exam and Culture    Hemoglobin A1C    Adult diet Therapeutic/ Modified; Solid; Regular (IDDSI level 7); Thin (IDDSI level 0); Heart healthy, Renal    Wound care    Apply Ice Pack    Vital signs    Pulse Oximetry    Progressive Mobility Protocol    I/O    Height    Weight    Skin assessment    Notify physician (Critical Blood Glucose Value)    Notify physician (Communication: Document Abnormal Blood Glucose)    POCT order (PRN hypoglycemia)    Adult Hypoglycemia Treatment Algorithm    Place sequential compression device    Maintain sequential compression device    Education: Activity    Education: Disease Process & Condition    Education: Pain Management    Education: Falls Risk    Orthostatic blood pressure    Visual acuity screening    Miscellaneous Special Request    Continuous tocometry    NSG Communication: Glucose POCT order    Full Code    ECG 12 lead    Echo Adult TTE Complete    Saline lock IV    Admit to Observation    Aspiration precautions    Fall precautions    Skin and pressure ulcer precautions     Medications Administered:  Medications   0.9% NaCl infusion ( Intravenous New Bag 10/15/23 0021)   bacitracin ointment (1 g Topical Given 10/15/23 0016)   aspirin  EC tablet 81 mg (has no  administration in time range)   atorvastatin  (LIPITOR) tablet 10 mg (has no administration in time range)   midodrine  (PROAMATINE ) tablet 5 mg (has no administration in time range)   pantoprazole  (PROTONIX ) EC tablet 40 mg (has no administration in time range)   ranolazine  (RANEXA ) 12 hr tablet 500 mg (has no administration in time range)   dextrose  (GLUCOSE) 40 % oral gel 15 g of glucose (has no administration in time range)     Or   dextrose  (D10W) 10% bolus 125 mL (has no administration in time range)     Or   dextrose  50 % bolus 12.5 g (has no administration in time range)     Or   glucagon  (rDNA) (GLUCAGEN) injection 1 mg (has no administration in time range)   acetaminophen  (TYLENOL ) tablet 650 mg (has no administration in time range)   melatonin tablet 3 mg (has no administration in time range)   saline (OCEAN NASAL SPRAY) 0.65 % nasal solution 2 spray (has no administration in time range)   carboxymethylcellulose (PF) (REFRESH PLUS) 0.5 % ophthalmic solution 1 drop (has no administration in time range)   benzocaine -menthol  (CEPACOL/CHLORASEPTIC) lozenge 1 lozenge (has no administration in time range)   benzonatate  (TESSALON ) capsule 100 mg (has no administration in time range)   naloxone  (NARCAN ) injection 0.2 mg (has no administration in time range)   ondansetron  (ZOFRAN -ODT) disintegrating tablet 4 mg (has no administration in time range)     Or  ondansetron  (ZOFRAN ) injection 4 mg (has no administration in time range)   enoxaparin  (LOVENOX ) syringe 40 mg (has no administration in time range)   traMADol  (ULTRAM ) tablet 50 mg (50 mg Oral Given 10/15/23 0317)   bacitracin ointment (has no administration in time range)   oxymetazoline (AFRIN) 0.05 % nasal spray 2 spray (2 sprays Each Nare Given 10/14/23 1926)   tranexamic acid (CYKLOKAPRON) 1000 MG/10 ML solution 10 mL (10 mLs Topical Given by Other 10/15/23 0013)   lidocaine -EPINEPHrine (XYLOCAINE  W/EPI) 1 %-1:100000 injection 1 mL (1 mL Intradermal Given by  Other 10/14/23 2200)   acetaminophen  (TYLENOL ) tablet 1,000 mg (1,000 mg Oral Given 10/14/23 2049)   tetracaine (PONTOCAINE) 0.5 % ophthalmic solution 2 drop (2 drops Both Eyes Given by Other 10/14/23 2201)   hydrALAZINE  (APRESOLINE ) injection 10 mg (10 mg Intravenous Given 10/15/23 0016)     Laboratory and Imaging Studies:  Results for orders placed or performed during the hospital encounter of 10/14/23 (from the past 24 hours)   Comprehensive Metabolic Panel    Collection Time: 10/14/23  8:11 PM   Result Value    Glucose 166 (H)    BUN 29 (H)    Creatinine 1.5 (H)    Sodium 133 (L)    Potassium 5.0    Chloride 104    CO2 23    Calcium  9.7    Anion Gap 6.0    GFR 33.5 (L)    AST (SGOT) 27    ALT 28    Alkaline Phosphatase 90    Albumin 3.3 (L)    Protein, Total 6.6    Globulin 3.3    Albumin/Globulin Ratio 1.0    Bilirubin, Total 0.3   CBC with Differential (Component)    Collection Time: 10/14/23  8:11 PM   Result Value    WBC 7.59    Hemoglobin 9.1 (L)    Hematocrit 27.8 (L)    Platelet Count 190    MPV 10.0    RBC 2.80 (L)    MCV 99.3 (H)    MCH 32.5    MCHC 32.7    RDW 13    nRBC % 0.0    Absolute nRBC 0.00    Preliminary Absolute Neutrophil Count 4.97    Neutrophils % 65.5    Lymphocytes % 18.4    Monocytes % 7.5    Eosinophils % 7.9    Basophils % 0.4    Immature Granulocytes % 0.3    Absolute Neutrophils 4.97    Absolute Lymphocytes 1.40    Absolute Monocytes 0.57    Absolute Eosinophils 0.60 (H)    Absolute Basophils 0.03    Absolute Immature Granulocytes 0.02   Urinalysis with Reflex to Microscopic Exam and Culture    Collection Time: 10/15/23  2:54 AM    Specimen: Urine, Clean Catch   Result Value    Urine Color Straw    Urine Clarity Clear    Urine Specific Gravity 1.014    Urine pH 6.5    Urine Leukocyte Esterase Negative    Urine Nitrite Negative    Urine Protein Negative    Urine Glucose Negative    Urine Ketones Negative    Urine Urobilinogen Normal    Urine Bilirubin Negative    Urine Blood Negative      CT Head without Contrast   Final Result       1.Right-sided facial soft tissue injury. Please see separate facial bone CT   report.  2.No CT evidence of acute intracranial hemorrhage, herniation or   hydrocephalus.   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      Butler Blanch, MD   10/14/2023 8:34 PM      CT Cervical Spine without Contrast   Final Result      1. No cervical spine fracture is detected.    2.  Multilevel cervical spondylosis.           Butler Blanch, MD   10/14/2023 8:31 PM      CT Sinus Facial Bones without Contrast   Final Result       1.Right-sided facial soft tissue injury.   2.Comminuted fracture along the left medial orbital wall/lamina papyracea   with herniation of fat through the osseous defect. There is right-sided   orbital emphysema and right-sided retrobulbar are soft tissue stranding.   Additionally, note is made of a small hematoma within the left extraconal   space adjacent to the medial rectus musculature. There is no evidence of   muscular entrapment.   3. These urgent findings were relayed via secure EPIC message with and   acknowledged by [facility] at 8:44 PM on 10/14/2023.        Butler Blanch, MD   10/14/2023 8:45 PM      CT Hip Right WO Contrast   Final Result      1.No evidence of acute bony injury of the right hip. Consider further   corroboration with MR imaging to rule out occult fracture if patient has   difficulty weightbearing.   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      Franky CHRISTELLA Louder, MD   10/14/2023 8:49 PM      Echo Adult TTE Complete    (Results Pending)     Attestations     Discussed case with ED attending, Honey Muss, Ardeth, MD, who is agreeable to the plan of care, treatment and disposition.          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    Seasonal allergic rhinitis     Syncope    [8]   Past Surgical History:  Procedure Laterality Date    BREAST BIOPSY Right     2014    CORONARY ARTERY BYPASS      HYSTERECTOMY      partial-1970's   [9]   Social History  Tobacco Use    Smoking status: Former     Current packs/day: 0.00     Types: Cigarettes     Quit date: 1990     Years since quitting: 35.4     Passive exposure: Past    Smokeless tobacco: Never   Vaping Use    Vaping status: Never Used   Substance Use Topics    Alcohol use: Not Currently     Comment: occasionally    Drug use: Never   [10]   Family History  Problem Relation Name Age of Onset    Heart failure Mother      Myocardial Infarction Father      Breast cancer Sister     [11]   Allergies  Allergen Reactions    Fentanyl Anaphylaxis  Hives    Percolone [Oxycodone]         Orlandria Kissner Kelly, [place]  10/15/23 [phone]

## 2023-10-14 NOTE — ED Notes (Signed)
 Nasal clamp placed on patient's nose at this time.

## 2023-10-14 NOTE — ED Provider Notes (Signed)
 [facility]  Emergency Department APP Note      Diagnosis/Disposition     ED Disposition:  Data Unavailable    ED Diagnosis:  Data Unavailable    Discharge Prescription    No medications on file       History of Present Illness and Medical Decision Making   Chief Complaint: Laceration, Head Injury, Fall, and Hip Pain     83 y.o. female with past medical history as below fall and NF using rollator  Facial laceration and epistaxis  Not on AC  - LOC  Head imaging    Hip pain, obtain XR.    CT Hip Right WO Contrast  Result Date: 10/14/2023  1.No evidence of acute bony injury of the right hip. Consider further corroboration with MR imaging to rule out occult fracture if patient has difficulty weightbearing. 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 Franky CHRISTELLA Louder, MD 10/14/2023 8:49 PM    CT Sinus Facial Bones without Contrast  Result Date: 10/14/2023   1.Right-sided facial soft tissue injury. 2.Comminuted fracture along the left medial orbital wall/lamina papyracea with herniation of fat through the osseous defect. There is right-sided orbital emphysema and right-sided retrobulbar are soft tissue stranding. Additionally, note is made of a small hematoma within the left extraconal space adjacent to the medial rectus musculature. There is no evidence of muscular entrapment. 3. These urgent findings were relayed via secure EPIC message with and acknowledged by [facility] at 8:44 PM on 10/14/2023.  Butler Blanch, MD 10/14/2023 8:45 PM    CT Head without Contrast  Result Date: 10/14/2023   1.Right-sided facial soft tissue injury. Please see separate facial bone CT report. 2.No CT evidence of acute intracranial hemorrhage, herniation or hydrocephalus. 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 Butler Blanch, MD 10/14/2023 8:34 PM    CT Cervical Spine without Contrast  Result Date: 10/14/2023  1. No cervical spine fracture is detected. 2.  Multilevel  cervical spondylosis.  Butler Blanch, MD 10/14/2023 8:31 PM      Physical Exam     ED Triage Vitals [10/14/23 1857]   Encounter Vitals Group      BP 198/80      Systolic BP Percentile       Diastolic BP Percentile       Heart Rate 75      Resp Rate 20      Temp 98.1 F (36.7 C)      Temp src Oral      SpO2 96 %      Weight 86 kg      Height 1.753 m      Head Circumference       Peak Flow       Pain Score 7      Pain Loc       Pain Education       Exclude from Growth Chart          Physical Activity: Inactive (08/19/2023)    Exercise Vital Sign     Days of Exercise per Week: 0 days     Minutes of Exercise per Session: 0 min        Pertinent examination findings discussed with APP.     Medical Decision Making (see above)      PRIMARY PROBLEM LIST      See above    DISCUSSION           Additional Notes  Vital Signs: Reviewed the patient's vital signs.   Nursing Notes: Reviewed and utilized available nursing notes.   Medical Records Reviewed: Reviewed available past medical records.   Counseling: The emergency provider has spoken with the patient and discussed today 's findings, in addition to providing specific details for the plan of care. Questions are answered and there is agreement with the plan.     CRITICAL CARE/PROCEDURES    Procedures          CARDIAC STUDIES      The following cardiac studies were independently interpreted by me the Emergency Medicine Provider. For full cardiac study results please see chart.                                                                   EMERGENCY IMAGING STUDIES    The following imagine studies were independently interpreted by me (emergency medicine provider):  The following imagine studies were independently interpreted by me (emergency medicine provider):                                       Supplemental Encounter Data   Medical History[1]  Past Surgical History[2]  Social History[3]  Family History[4]   Allergies[5]    Encounter Orders:  Orders Placed This Encounter   Procedures    Hip Right AP and Lateral with Pelvis    CT Cervical Spine without Contrast    CT Head without Contrast    CT Sinus Facial Bones without Contrast    Wound care     Medications Administered:  Medications   tranexamic acid (CYKLOKAPRON) 1000 MG/10 ML solution 10 mL (has no administration in time range)   lidocaine -EPINEPHrine (XYLOCAINE  W/EPI) 1 %-1:100000 injection 1 mL (has no administration in time range)   oxymetazoline (AFRIN) 0.05 % nasal spray 2 spray (2 sprays Each Nare Given 10/14/23 1926)     Laboratory and Imaging Studies:     Hip Right AP and Lateral with Pelvis    (Results Pending)   CT Cervical Spine without Contrast    (Results Pending)   CT Head without Contrast    (Results Pending)   CT Sinus Facial Bones without Contrast    (Results Pending)            [1]   Past Medical History:  Diagnosis Date    Diabetes mellitus (CMS/HCC)     Gastroesophageal reflux disease     Hyperlipidemia     Hypertension     Hypotension     Malignant tumor of breast (CMS/HCC) 04/13/2016    Seasonal allergic rhinitis     Syncope    [2]   Past Surgical History:  Procedure Laterality Date    BREAST BIOPSY Right     2014    CORONARY ARTERY BYPASS      HYSTERECTOMY      partial-1970's   [3]   Social History  Tobacco Use    Smoking status: Former     Current packs/day: 0.00     Types: Cigarettes     Quit date: 1990     Years since quitting: 35.4     Passive exposure: Past  Smokeless tobacco: Never   Vaping Use    Vaping status: Never Used   Substance Use Topics    Alcohol use: Not Currently     Comment: occasionally    Drug use: Never   [4]   Family History  Problem Relation Name Age of Onset    Heart failure Mother      Myocardial Infarction Father      Breast cancer Sister     [5]   Allergies  Allergen Reactions    Fentanyl Anaphylaxis     Hives    Percolone [Oxycodone]         Daril Bouchard, DO  10/14/23 2056

## 2023-10-14 NOTE — ED to IP RN Note (Signed)
 [facility]  ED NURSING NOTE FOR THE RECEIVING INPATIENT NURSE   ED [place], RN   [facility] [phone]   ED CHARGE RN Andrea, RN   ADMISSION INFORMATION   Vanessa Kelly is a 83 y.o. female admitted with an ED diagnosis of:    1. Injury of head, initial encounter    2. Facial laceration, initial encounter    3. Closed fracture of orbit, initial encounter (CMS/HCC)    4. Hypertension, unspecified type         Isolation: None   Allergies: Fentanyl and Percolone [oxycodone]   Holding Orders confirmed? Yes   Belongings Documented? Yes   Home medications sent to pharmacy confirmed? N/A   NURSING CARE   Patient Comes From:   Mental Status: Assisted Living  alert and oriented   ADL: Needs assistance with ADLs   Ambulation: ambulates with: walker   Pertinent Information  and Safety Concerns:     Broset Violence Risk Level: Low Pt presents to ED after a fall while using her rollator where she fell forward into a desk and hit her head on it. Pt with 1 inch lac to her RIGHT forehead and ecchymosis and edema to her RIGHT eye. Pt denies LOC or AC use. Pt with profuse nose bleed upon initial arrival that was able to stop with afrin nasal spray and nose clip. Pt also with pain to her RIGHT hip and had xrays that revealed no injury. Pt CT imaging showed nasal bone fx on the RIGHT side as well as facial fractures.       CT / NIH   CT Head ordered on this patient?  Yes   NIH/Dysphagia assessment done prior to admission? No   VITAL SIGNS (at the time of this note)      Vitals:    10/14/23 2331   BP: 192/77   Pulse: (!) 57   Resp: 18   Temp:    SpO2: 96%     Pain Score: 10-severe pain (10/14/23 2100)

## 2023-10-14 NOTE — EDIE (Signed)
 [facility] NOTIFICATION 10/14/2023 12:49 Vanessa Kelly, Vanessa Kelly DOB: [birth year] MRN: [id number]    [facility] [facility]'s patient encounter information:   FMW:[id number]  Account [id number]  Billing Account [id number]      Criteria Met      5 ED Visits in 12 Months    Security and Safety  No Security Events were found.  ED Care Guidelines  There are currently no ED Care Guidelines for this patient. Please check your facility's medical records system.        Prescription Monitoring Program  Narx Score not available at this time.    E.D. Visit Count (12 mo.)  Facility Visits   [place] [facility] 5   Total 5   Note: Visits indicate total known visits.     Recent Emergency Department Visit Summary  Date Facility [facility] Type Diagnoses or Chief Complaint    Oct 14, 2023  [place] [facility] [place] [facility]  [place]  [place]  Emergency      fall      Jul 15, 2023  [place] [facility] [place] [facility]  [place]  [place]  Emergency      Altered mental status, unspecified      Altered Mental Status      Slurred Speech      Extremity Weakness      Urinary retention      Jun 21, 2023  [place] [facility]  [place]  [place]  Emergency      Noninfective gastroenteritis and colitis, unspecified      Emesis      Diarrhea      Medic      Jun 21, 2023  [place] [facility] [place] [facility]  [place]  [place]  Emergency      Pain in right shoulder      Noninfective gastroenteritis and colitis, unspecified      Arm Pain      Generalized weakness      Medic      Dec 04, 2022  [place] [facility]  [place]  [place]  Emergency      Dizziness and giddiness      Fall      Syncope      medic        Recent Inpatient Visit Summary  Date Facility [facility] Type Diagnoses or Chief Complaint    Aug 17, 2023  [place] [facility] [place] [facility]  [place]  [place]  Medical Surgical      Tremor, unspecified      Essential (primary) hypertension      Jul 15, 2023  [place] [facility] [place] [facility]  [place]  [place]  Medical Surgical      Acute cough      Wheezing      Acute cystitis without hematuria       Altered mental status, unspecified        Care Team  Provider Specialty Phone Fax Service Dates   CARON QUITTER [phone] Nurse Practitioner [phone] Current    BEHIRI, [facility] , DO Internal Medicine   Current    DIAKIWSKY, CATHERINE, E, (FNP-BC) Nurse Practitioner: Family [phone] Current    KAY, MERL FALCON, M.D. Internal Medicine   Current    Albina Sender, M.D. Internal Medicine   Current      [facility]  This patient has registered at the [facility] Emergency Department  For more information visit: [email or web address]     PLEASE NOTE:     1.   Any care recommendations and other clinical information are provided as guidelines or for historical purposes only, and providers should exercise their own clinical judgment when providing care.    2.   You may only use this information for purposes of treatment, payment or health care operations activities, and subject to the limitations of applicable [facility] Policies.    3.   You should consult directly with the organization that provided a care guideline or other clinical history with any questions about additional information or accuracy or completeness of information provided.    ? 2025 [facility] - [email or web address]

## 2023-10-15 ENCOUNTER — Observation Stay

## 2023-10-15 ENCOUNTER — Observation Stay (HOSPITAL_BASED_OUTPATIENT_CLINIC_OR_DEPARTMENT_OTHER)

## 2023-10-15 DIAGNOSIS — R55 Syncope and collapse: Secondary | ICD-10-CM

## 2023-10-15 LAB — LAB USE ONLY - CBC WITH DIFFERENTIAL
Absolute Basophils: 0.04 x10 3/uL (ref 0.00–0.08)
Absolute Eosinophils: 0.65 x10 3/uL — ABNORMAL HIGH (ref 0.00–0.44)
Absolute Immature Granulocytes: 0.02 x10 3/uL (ref 0.00–0.07)
Absolute Lymphocytes: 1.68 x10 3/uL (ref 0.42–3.22)
Absolute Monocytes: 0.58 x10 3/uL (ref 0.21–0.85)
Absolute Neutrophils: 3.76 x10 3/uL (ref 1.10–6.33)
Absolute nRBC: 0 x10 3/uL (ref <=0.00)
Basophils %: 0.6 %
Eosinophils %: 9.7 %
Hematocrit: 28.5 % — ABNORMAL LOW (ref 34.7–43.7)
Hemoglobin: 9.5 g/dL — ABNORMAL LOW (ref 11.4–14.8)
Immature Granulocytes %: 0.3 %
Lymphocytes %: 25 %
MCH: 33 pg (ref 25.1–33.5)
MCHC: 33.3 g/dL (ref 31.5–35.8)
MCV: 99 fL — ABNORMAL HIGH (ref 78.0–96.0)
MPV: 10.2 fL (ref 8.9–12.5)
Monocytes %: 8.6 %
Neutrophils %: 55.8 %
Platelet Count: 184 x10 3/uL (ref 142–346)
Preliminary Absolute Neutrophil Count: 3.76 x10 3/uL (ref 1.10–6.33)
RBC: 2.88 x10 6/uL — ABNORMAL LOW (ref 3.90–5.10)
RDW: 13 % (ref 11–15)
WBC: 6.73 x10 3/uL (ref 3.10–9.50)
nRBC %: 0 /100{WBCs} (ref <=0.0)

## 2023-10-15 LAB — WHOLE BLOOD GLUCOSE POCT
Whole Blood Glucose POCT: 158 mg/dL — ABNORMAL HIGH (ref 70–100)
Whole Blood Glucose POCT: 179 mg/dL — ABNORMAL HIGH (ref 70–100)
Whole Blood Glucose POCT: 94 mg/dL (ref 70–100)

## 2023-10-15 LAB — ECHO ADULT TTE COMPLETE
AV Mean Gradient: 11
AV Peak Velocity: 2.21
BP Mod LV Ejection Fraction: 69
IVS Diastolic Thickness (2D): 1.24
LA Dimension (2D): 3.5
LA Volume Index (BP A-L): 24.0795
LVID diastole (2D): 3.95
LVID systole (2D): 2.52
MV E/A: 1.0392
MV E/e' (Average): 12.7346
Mitral Valve Findings: NORMAL
Prox Ascending Aorta Diameter: 3.3
Pulmonary Valve Findings: NORMAL
RV Systolic Pressure: 34.5844
TAPSE: 1.68
Tricuspid Valve Findings: NORMAL

## 2023-10-15 LAB — BASIC METABOLIC PANEL
Anion Gap: 4 — ABNORMAL LOW (ref 5.0–15.0)
BUN: 28 mg/dL — ABNORMAL HIGH (ref 7–21)
CO2: 25 meq/L (ref 17–29)
Calcium: 10 mg/dL (ref 7.9–10.2)
Chloride: 107 meq/L (ref 99–111)
Creatinine: 1.3 mg/dL — ABNORMAL HIGH (ref 0.4–1.0)
GFR: 39.8 mL/min/1.73 m2 — ABNORMAL LOW (ref >=60.0)
Glucose: 154 mg/dL — ABNORMAL HIGH (ref 70–100)
Potassium: 5.3 meq/L (ref 3.5–5.3)
Sodium: 136 meq/L (ref 135–145)

## 2023-10-15 LAB — IRON PROFILE
Iron Saturation: 47 % (ref 15–50)
Iron: 126 ug/dL (ref 32–157)
TIBC: 269 ug/dL (ref 265–497)
UIBC: 143 ug/dL (ref 126–382)

## 2023-10-15 LAB — URINALYSIS WITH REFLEX TO MICROSCOPIC EXAM - REFLEX TO CULTURE
Urine Bilirubin: NEGATIVE
Urine Blood: NEGATIVE
Urine Glucose: NEGATIVE
Urine Ketones: NEGATIVE mg/dL
Urine Leukocyte Esterase: NEGATIVE
Urine Nitrite: NEGATIVE
Urine Protein: NEGATIVE
Urine Specific Gravity: 1.014 (ref 1.001–1.035)
Urine Urobilinogen: NORMAL mg/dL (ref 0.2–2.0)
Urine pH: 6.5 (ref 5.0–8.0)

## 2023-10-15 LAB — HEMOGLOBIN A1C
Average Estimated Glucose: 116.9 mg/dL
Hemoglobin A1C: 5.7 % — ABNORMAL HIGH (ref 4.6–5.6)

## 2023-10-15 LAB — LAB USE ONLY - URINE GRAY CULTURE HOLD TUBE

## 2023-10-15 LAB — FERRITIN: Ferritin: 44.4 ng/mL (ref 4.60–204.00)

## 2023-10-15 LAB — PHOSPHORUS: Phosphorus: 2.8 mg/dL (ref 2.3–4.7)

## 2023-10-15 LAB — FOLATE
Folate: 14.9 ng/mL (ref >5.4)
Hemolysis Index: 2 {index}

## 2023-10-15 LAB — VITAMIN B12: Vitamin B-12: 500 pg/mL (ref 211–911)

## 2023-10-15 LAB — MAGNESIUM: Magnesium: 2 mg/dL (ref 1.6–2.6)

## 2023-10-15 MED ORDER — SALINE SPRAY 0.65 % NA SOLN
2.0000 | NASAL | Status: DC | PRN
Start: 2023-10-15 — End: 2023-10-19

## 2023-10-15 MED ORDER — DEXTROSE 50 % IV SOLN
12.5000 g | INTRAVENOUS | Status: DC | PRN
Start: 2023-10-15 — End: 2023-10-19

## 2023-10-15 MED ORDER — LIDOCAINE 5 % EX OINT
TOPICAL_OINTMENT | Freq: Three times a day (TID) | CUTANEOUS | Status: AC | PRN
Start: 2023-10-15 — End: 2023-10-16
  Filled 2023-10-15: qty 35.44

## 2023-10-15 MED ORDER — BENZOCAINE-MENTHOL MT LOZG (WRAP)
1.0000 | LOZENGE | OROMUCOSAL | Status: DC | PRN
Start: 2023-10-15 — End: 2023-10-19
  Administered 2023-10-15 – 2023-10-16 (×2): 1 via BUCCAL
  Filled 2023-10-15 (×2): qty 1

## 2023-10-15 MED ORDER — BACITRACIN +/- ZINC 500 UNIT/GM EX OINT (WRAP)
TOPICAL_OINTMENT | Freq: Three times a day (TID) | CUTANEOUS | Status: DC
Start: 2023-10-15 — End: 2023-10-19
  Administered 2023-10-15 – 2023-10-17 (×7): 1 g via TOPICAL
  Filled 2023-10-15 (×12): qty 1

## 2023-10-15 MED ORDER — ENOXAPARIN SODIUM 40 MG/0.4ML IJ SOSY
40.0000 mg | PREFILLED_SYRINGE | Freq: Every day | INTRAMUSCULAR | Status: DC
Start: 2023-10-15 — End: 2023-10-19
  Administered 2023-10-15 – 2023-10-19 (×5): 40 mg via SUBCUTANEOUS
  Filled 2023-10-15 (×5): qty 0.4

## 2023-10-15 MED ORDER — PANTOPRAZOLE SODIUM 40 MG PO TBEC
40.0000 mg | DELAYED_RELEASE_TABLET | Freq: Every morning | ORAL | Status: DC
Start: 2023-10-15 — End: 2023-10-19
  Administered 2023-10-15 – 2023-10-19 (×5): 40 mg via ORAL
  Filled 2023-10-15 (×5): qty 1

## 2023-10-15 MED ORDER — NALOXONE HCL 0.4 MG/ML IJ SOLN (WRAP)
0.2000 mg | INTRAMUSCULAR | Status: DC | PRN
Start: 2023-10-15 — End: 2023-10-19

## 2023-10-15 MED ORDER — GLUCAGON 1 MG IJ SOLR (WRAP)
1.0000 mg | INTRAMUSCULAR | Status: DC | PRN
Start: 2023-10-15 — End: 2023-10-19

## 2023-10-15 MED ORDER — MELATONIN 3 MG PO TABS
3.0000 mg | ORAL_TABLET | Freq: Every evening | ORAL | Status: DC | PRN
Start: 2023-10-15 — End: 2023-10-19
  Administered 2023-10-18 (×2): 3 mg via ORAL
  Filled 2023-10-15 (×2): qty 1

## 2023-10-15 MED ORDER — GLUCOSE 40 % PO GEL (WRAP)
15.0000 g | ORAL | Status: DC | PRN
Start: 2023-10-15 — End: 2023-10-19

## 2023-10-15 MED ORDER — ACETAMINOPHEN 325 MG PO TABS
650.0000 mg | ORAL_TABLET | ORAL | Status: DC | PRN
Start: 2023-10-15 — End: 2023-10-19
  Administered 2023-10-15 – 2023-10-19 (×7): 650 mg via ORAL
  Filled 2023-10-15 (×7): qty 2

## 2023-10-15 MED ORDER — ASPIRIN 81 MG PO TBEC
81.0000 mg | DELAYED_RELEASE_TABLET | Freq: Every day | ORAL | Status: DC
Start: 2023-10-15 — End: 2023-10-19
  Administered 2023-10-15 – 2023-10-19 (×5): 81 mg via ORAL
  Filled 2023-10-15 (×5): qty 1

## 2023-10-15 MED ORDER — MIDODRINE HCL 5 MG PO TABS
5.0000 mg | ORAL_TABLET | Freq: Three times a day (TID) | ORAL | Status: DC | PRN
Start: 2023-10-15 — End: 2023-10-15

## 2023-10-15 MED ORDER — CARBOXYMETHYLCELLULOSE SOD PF 0.5 % OP SOLN
1.0000 [drp] | Freq: Three times a day (TID) | OPHTHALMIC | Status: DC | PRN
Start: 2023-10-15 — End: 2023-10-19

## 2023-10-15 MED ORDER — RANOLAZINE ER 500 MG PO TB12
500.0000 mg | ORAL_TABLET | Freq: Two times a day (BID) | ORAL | Status: DC
Start: 2023-10-15 — End: 2023-10-19
  Administered 2023-10-15 – 2023-10-19 (×9): 500 mg via ORAL
  Filled 2023-10-15 (×10): qty 1

## 2023-10-15 MED ORDER — TRAMADOL HCL 50 MG PO TABS
50.0000 mg | ORAL_TABLET | Freq: Four times a day (QID) | ORAL | Status: DC | PRN
Start: 2023-10-15 — End: 2023-10-19
  Administered 2023-10-15 – 2023-10-16 (×4): 50 mg via ORAL
  Filled 2023-10-15 (×4): qty 1

## 2023-10-15 MED ORDER — ONDANSETRON 4 MG PO TBDP
4.0000 mg | ORAL_TABLET | Freq: Four times a day (QID) | ORAL | Status: DC | PRN
Start: 2023-10-15 — End: 2023-10-19

## 2023-10-15 MED ORDER — DEXTROSE 10 % IV BOLUS
12.5000 g | INTRAVENOUS | Status: DC | PRN
Start: 2023-10-15 — End: 2023-10-19

## 2023-10-15 MED ORDER — ATORVASTATIN CALCIUM 10 MG PO TABS
10.0000 mg | ORAL_TABLET | Freq: Every day | ORAL | Status: DC
Start: 2023-10-15 — End: 2023-10-19
  Administered 2023-10-15 – 2023-10-18 (×4): 10 mg via ORAL
  Filled 2023-10-15 (×4): qty 1

## 2023-10-15 MED ORDER — BACITRACIN +/- ZINC 500 UNIT/GM EX OINT (WRAP)
TOPICAL_OINTMENT | Freq: Three times a day (TID) | CUTANEOUS | Status: DC
Start: 2023-10-15 — End: 2023-10-19
  Administered 2023-10-15 – 2023-10-19 (×12): 1 g via TOPICAL
  Filled 2023-10-15 (×4): qty 1

## 2023-10-15 MED ORDER — ONDANSETRON HCL 4 MG/2ML IJ SOLN
4.0000 mg | Freq: Four times a day (QID) | INTRAMUSCULAR | Status: DC | PRN
Start: 2023-10-15 — End: 2023-10-19

## 2023-10-15 MED ORDER — BENZONATATE 100 MG PO CAPS
100.0000 mg | ORAL_CAPSULE | Freq: Three times a day (TID) | ORAL | Status: DC | PRN
Start: 2023-10-15 — End: 2023-10-19

## 2023-10-15 NOTE — Nursing Progress Note (Signed)
-   83 y/o female admitted to 3B Unit, from ED.   - Pt is A&OX4,  calm, responds appropriately to verbal commands and stimuli.   - Oriented to room, bed in lowest position, bed alarm on, call bell and over bed table within reach.   - Admission/skin assessments, patient teachings/education done.  - Skin assessment done with Efren,RN  - Admission's vitals WDL. Pt shows no sign/ symptoms of acute distress.   - Reported 7/10 pain on face, on assessment   - Fall prevention and belongings paperwork completed.  - Pt's admission belongings include: watch, and cell phone.  - Pt/Family informed of visiting policy.  - Fall & safety protocols maintain.  - Plan of care discussed with patient/family.    4 eyes in 4 hours pressure injury assessment note:      RN or CNA Completed with: Efren, RN           Bony Prominences: Check appropriate box below     If wound is present add wound to LDA avatar      Occiput:   [x]  WNL   []  Wound present    Face:                     []  WNL    [x]  Wound present    Ears:      [x]  WNL   []  Wound present    Spine:    [x]  WNL   []  Wound present    Shoulders:    [x]  WNL    []  Wound present    Elbows:    [x]  WNL    []  Wound present    Sacrum/coccyx:   [x]  WNL    []  Wound present    Ischial Tuberosity:  [x]  WNL  []  Wound present    Trochanter/Hip:       [x]  WNL  []  Wound present    Knees:     [x]  WNL  []  Wound present    Ankles:     [x]  WNL  []  Wound present    Heels:    [x]  WNL  []  Wound present      Other pressure areas:      Wound Location:      Device related: []  Device:       Consult WOCN for guidance & staging of pressure injuries.         Geriatric Focused Assessment    Is the patient age 26 or older?Yes    Sleep:   Does the patient have an ineffective sleeping pattern? No      Problems with feeding:    Does the patient have an adequate appetite? Yes   Is the patient able to feed themself? Yes    Incontinence:   Is the patient incontient of bowel or bladder? No      Confusion:   Does the patient  have any signs or symptoms of deliruim? No      Evidence of falls:   Is the patient a documented high fall risk? Yes    Skin Breakdown:   Does the patient have any areas of skin breakdown? Yes   Are there any risk factors? Yes

## 2023-10-15 NOTE — Nursing Progress Note (Addendum)
 Patient is alert and oriented x4. Vital signs stable, on room air. Scheduled medication given. Patient complained of right eye, lip, facial and left chest pain. Tramadol , Tylenol  and lidocaine  PRN given with positive effect. Blood glucose check done. Meal served and tolerated. Orthostatic BP done. Visual acuity done. Patient able to turn self, dangle at bedside and stand this shift. Bacitracin ointment applied to right eye laceration as ordered. Meals served and well tolerated. Bed in the lowest position, call light within reach and all safety precautions in place. Purposeful rounding done.   Echo TTE done    Problem: Pain interferes with ability to perform ADL  Goal: Pain at adequate level as identified by patient  Outcome: Progressing  Pain at adequate level as identified by patient:   Identify patient comfort function goal   Assess pain on admission, during daily assessment and/or before any as needed intervention(s)   Reassess pain within 30-60 minutes of any procedure/intervention, per Pain Assessment, Intervention, Reassessment (AIR) Cycle   Evaluate if patient comfort function goal is met     Problem: Moderate/High Fall Risk Score >5  Goal: Patient will remain free of falls  Outcome: Progressing  High (Greater than 13): HIGH-Consider use of low bed     Problem: Safety  Goal: Patient will be free from injury during hospitalization  Outcome: Progressing  Patient will be free from injury during hospitalization:   Assess patient's risk for falls and implement fall prevention plan of care per policy   Provide and maintain safe environment   Hourly rounding   Provide alternative method of communication if needed (communication boards, writing)  Goal: Patient will be free from infection during hospitalization  Outcome: Progressing  Free from Infection during hospitalization:   Assess and monitor for signs and symptoms of infection   Monitor lab/diagnostic results       Geriatric Focused Assessment    Is the  patient age 68 or older?Yes    Sleep:   Does the patient have an ineffective sleeping pattern? No      Problems with feeding:    Does the patient have an adequate appetite? Yes   Is the patient able to feed themself? Yes    Incontinence:   Is the patient incontient of bowel or bladder? Yes    Confusion:   Does the patient have any signs or symptoms of deliruim? No      Evidence of falls:   Is the patient a documented high fall risk? Yes    Skin Breakdown:   Does the patient have any areas of skin breakdown? Yes   Are there any risk factors? Yes

## 2023-10-15 NOTE — H&P (Signed)
 ADMISSION HISTORY AND PHYSICAL EXAM    [facility], DIVISION OF HOSPITALIST MEDICINE   [facility]   [place] Pager: [phone]      Date Time: 10/15/23 4:30 AM  Patient Name: Vanessa Kelly  Attending Physician: Sherida Kate SQUIBB, MD  Primary Care Physician: Aneita Dorothyann BRAVO, NP    CC: fall with head trauma  History Gathered From: Self and Adult Child    Active Hospital Problems    Diagnosis    Injury of head, initial encounter       Patient has a BMI of 27.4 kg/m2    Overweight: BMI of 25 to 29.9       Recent Labs     10/14/23  2011   Sodium 133*     Diagnosis: Mild Hyponatremia     Recent Labs   Lab 10/14/23  2011   Hemoglobin 9.1*   Hematocrit 27.8*   MCV 99.3*   WBC 7.59   Platelet Count 190         Anemia Diagnosis: Unspecified Anemia (currently unable to determine type)  Cr Baseline Estimation (minimum in last 3 months): 1.2 mg/dL  Maximum Cr in last 36 hours: 1.5 mg/dL    Recent Labs (Last 3 Months)     10/14/23  2011 08/18/23  0320 08/17/23  2337   Creatinine 1.5* 1.3* 1.3*   BUN 29* 18 20       Acute Kidney Injury Diagnosis: Acute kidney injury, present on admission     Assessment and Plan :   Vanessa Kelly is a 83 y.o. female with PMH of syncope, orthostatic hypotension, CAD s/p CABG, HTN, HLD, DM2, anemia, GERD presents after fall vs syncope with head trauma at nursing home. BP elevated, other vitals stable. Labs significant for no leukocytosis, Hb 9.1, Cr 1.5, Na 133, UA negative for infection. CT facial bones Comminuted fracture along the left medial orbital wall/lamina papyracea with herniation of fat through the osseous defect. There is right-sided orbital emphysema and right-sided retrobulbar are soft tissue stranding. Additionally, note is made of a small hematoma within the left extraconal space adjacent to the medial rectus musculature. There is no evidence of muscular entrapment. Admitted for ambulatory dysfunction 2/2 fall with head trauma.       -Admit to  [facility] service    #right orbital fracture  #epistaxis, resolved  #supraorbital laceration   CT facial bones- Right-sided facial soft tissue injury.  2.Comminuted fracture along the left medial orbital wall/lamina papyracea with herniation of fat through the osseous defect. There is right-sided orbital emphysema and right-sided retrobulbar are soft tissue stranding. Additionally, note is made of a small hematoma within the left extraconal space adjacent to the medial rectus musculature. There is no evidence of muscular entrapment.  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  -fu HbA1c  -monitor POC    #hx of right breast cancer          VTE Prophylaxis-Medication VTE Prophylaxis Orders: enoxaparin  (LOVENOX ) syringe 40 mg  Mechanical VTE Prophylaxis Orders: Maintain sequential compression device  Nutrition:Adult diet Therapeutic/ Modified; Solid; Regular (IDDSI level 7); Thin (IDDSI level 0); Heart healthy, Renal      Code status: Full code     Status/Disposition:   Pt is admitted under INPATIENT with above concerns.    Anticipated medical stability for discharge: I expect an inpatient to remain in the hospital for more than 2 midnights due to fall      History  of Presenting Illnes   Vanessa Kelly is a 83 y.o. female with PMH of syncope, orthostatic hypotension, CAD s/p CABG, HTN, HLD, DM2, GERD presents after fall vs syncope with head trauma at nursing home. Pt states she was trying to stand up from her rolator, she left weak and dizzy as she was standing up, lost her balance and fell forward onto her face. She had blood coming from her right eye, nose and mouth. Also endorses right hip pain. She does not know if she lost consciousness but not remember the total event. No reports confusion, seizure activity, incontinence. Reports feeling generally weak, no focal weakness. On aspirin , no AC. Endorses dysuria. Denies headache, blurry vision, vertigo, pain with eye movements, chest pain, sob, palpitations, abd pain, nausea, vomiting, fevers, chills, constipations, diarrhea.       Past Medical Histor     Past Medical History:   Diagnosis Date    Diabetes mellitus (CMS/HCC)     Gastroesophageal reflux disease     Hyperlipidemia     Hypertension     Hypotension     Malignant tumor of breast (CMS/HCC) 04/13/2016    Seasonal allergic rhinitis     Syncope        Available old records reviewed, including: EPIC     Past Surgical History:    has a past surgical history that includes Breast biopsy (Right); Hysterectomy; and CORONARY ARTERY BYPASS.      Family History:   family history includes Breast cancer in her sister; Heart failure in her mother; Myocardial Infarction in her father.    Social History:    reports that she quit smoking about 35 years ago. Her smoking use included cigarettes. She has been exposed to tobacco smoke. She has never used smokeless tobacco. She reports that she does not currently use alcohol. She reports that she does not use drugs.    Allergies:   Allergies[1]    Medications:     Home Medications       Med List Status: In Progress Set By: Debera Alan BROCKS, RN at 10/14/2023  7:01 PM              acetaminophen  (TYLENOL ) 325 MG tablet     Take 2  tablets (650 mg) by mouth every 6 (six) hours as needed for Pain (or headache)     albuterol  sulfate HFA (PROVENTIL ) 108 (90 Base) MCG/ACT inhaler          Patient not taking: No sig reported  aspirin  EC 81 MG EC tablet     Take 1 tablet (81 mg) by mouth daily     calcium  carbonate (TUMS) 500 MG chewable tablet     Chew 1 tablet (500 mg) by mouth every 6 (six) hours as needed for Heartburn     candesartan (ATACAND) 4 MG tablet     Take 1 tablet (4 mg) by mouth daily as needed (SBP > 160)     ergocalciferol  (ERGOCALCIFEROL ) 1.25 MG (50000 UT) capsule     Take 1 capsule (50,000 Units) by mouth once a week On Saturday     fexofenadine (ALLEGRA) 180 MG tablet     Take 1 tablet (180 mg) by mouth daily     fluticasone  (Flonase  Sensimist) 27.5 MCG/SPRAY nasal spray     1 spray by Nasal route daily     lidocaine  (LIDODERM ) 5 %     Place 1 patch onto the skin in the morning. Remove & Discard patch within 12 hours or as directed by MD.     lidocaine  (LIDODERM ) 5 %     Place 1 patch onto the skin every 24 hours Remove & Discard patch within 12 hours or as directed by MD     lovastatin (MEVACOR) 40 MG tablet     Take 1 tablet (40 mg) by mouth nightly     meclizine  (ANTIVERT ) 12.5 MG tablet     Take 2 tablets (25 mg) by mouth 3 (three) times daily as needed for Dizziness     Patient not taking: Reported on 08/15/2023     midodrine  (PROAMATINE ) 5 MG tablet     Take 1 tablet (5 mg) by mouth 3 (three) times daily As needed if SBP < 100 mm of HG     Multiple Vitamins-Minerals (Centrum Silver 50+Women) Tab     Take 1 tablet by mouth daily     ondansetron  (ZOFRAN -ODT) 4 MG disintegrating tablet     Take 1 tablet (4 mg) by mouth every 6 (six) hours as needed for Nausea     Patient not taking: Reported on 08/15/2023     ondansetron  (ZOFRAN -ODT) 4 MG disintegrating tablet     Take 1 tablet (4 mg) by mouth every 6 (six) hours as needed for Nausea     Patient not taking: Reported on 08/15/2023     pantoprazole  (PROTONIX ) 40 MG tablet      Take 1 tablet (40 mg) by mouth nightly     polyethylene glycol (MIRALAX ) 17 g packet     Take 17 g by mouth daily     ranolazine  (RANEXA ) 500 MG 12 hr tablet     Take 1 tablet (500 mg) by mouth 2 (two) times daily     sucralfate  (CARAFATE ) 1 g tablet     Take 1 tablet (1 g) by mouth every 6 (six) hours as needed (abdominal pain)     traMADol  (ULTRAM ) 50 MG tablet     Take 1 tablet (50 mg) by mouth every 8 (eight) hours as needed for Pain     venlafaxine  (EFFEXOR -XR) 37.5 MG 24 hr capsule     Take 2 capsules (75 mg) by mouth nightly            Reviewed Medications from Dispense report: [] YES / [] NO   Confirmed Medications with patient/family:     [] YES / [] NO       Review of Systems:   All other systems were reviewed and are negative except:as above in  HPI     Physical Exam:   Patient Vitals for the past 24 hrs:   BP Temp Temp src Pulse Resp SpO2 Height Weight   10/15/23 0418 163/61 97.9 F (36.6 C) Oral 60 18 99 % -- --   10/15/23 0128 166/70 97.5 F (36.4 C) Oral 64 17 100 % -- --   10/15/23 0125 -- -- -- -- -- -- 1.752 m (5' 8.98) 84.1 kg (185 lb 6.5 oz)   10/15/23 0030 147/65 -- -- 72 17 97 % -- --   10/14/23 2331 192/77 -- -- (!) 57 18 96 % -- --   10/14/23 2300 177/72 -- -- (!) 58 16 95 % -- --   10/14/23 2231 (!) 208/82 -- -- 60 19 96 % -- --   10/14/23 2201 189/74 -- -- 64 17 96 % -- --   10/14/23 2130 (!) 201/81 -- -- (!) 59 18 95 % -- --   10/14/23 2100 (!) 208/86 -- -- 61 22 97 % -- --   10/14/23 1900 164/67 -- -- 73 20 97 % -- --   10/14/23 1857 198/80 98.1 F (36.7 C) Oral 75 20 96 % 1.753 m (5' 9) 86 kg (189 lb 9.5 oz)     Body mass index is 27.4 kg/m.  No intake or output data in the 24 hours ending 10/15/23 0430      Physical Exam  Vitals reviewed.   Constitutional:       General: She is not in acute distress.  HENT:      Head: Normocephalic and atraumatic.      Nose:      Comments: No deformity or crepitus, No further bleeding. Small abrasion below right nare     Mouth/Throat:      Mouth:  Mucous membranes are moist.      Pharynx: Oropharynx is clear.   Eyes:      General: Vision grossly intact. No visual field deficit.        Right eye: No discharge.         Left eye: No discharge.      Intraocular pressure: Right eye pressure is 15 mmHg. Left eye pressure is 8 mmHg.      Extraocular Movements: Extraocular movements intact.      Right eye: Normal extraocular motion and no nystagmus.      Left eye: Normal extraocular motion and no nystagmus.      Conjunctiva/sclera:      Right eye: Right conjunctiva is injected. Hemorrhage present.      Comments: Right- Periorbital edema and bruising. Supraorbital laceration. Tenderness, conjunctival hemorrhage, eye movement intact w/o pain, visual acuity intact   Cardiovascular:      Rate and Rhythm: Normal rate and regular rhythm.      Heart sounds: Normal heart sounds. No murmur heard.     No gallop.   Pulmonary:      Effort: Pulmonary effort is normal. No respiratory distress.      Breath sounds: Normal breath sounds. No stridor. No wheezing, rhonchi or rales.   Abdominal:      General: Abdomen is flat. Bowel sounds are normal. There is no distension.      Palpations: Abdomen is soft. There is no mass.      Tenderness: There is no abdominal tenderness. There is no guarding or rebound.      Hernia: No hernia is present.   Musculoskeletal:         General: Tenderness  present. No swelling or deformity.      Cervical back: Normal range of motion and neck supple.      Right lower leg: No edema.      Left lower leg: No edema.      Comments: Mild Right hip tenderness.    Skin:     General: Skin is warm and dry.   Neurological:      Mental Status: She is alert and oriented to person, place, and time.             Labs:     Results for orders placed or performed during the hospital encounter of 10/14/23 (from the past 24 hours)   Comprehensive Metabolic Panel    Collection Time: 10/14/23  8:11 PM   Result Value    Glucose 166 (H)    BUN 29 (H)    Creatinine 1.5 (H)     Sodium 133 (L)    Potassium 5.0    Chloride 104    CO2 23    Calcium  9.7    Anion Gap 6.0    GFR 33.5 (L)    AST (SGOT) 27    ALT 28    Alkaline Phosphatase 90    Albumin 3.3 (L)    Protein, Total 6.6    Globulin 3.3    Albumin/Globulin Ratio 1.0    Bilirubin, Total 0.3   CBC with Differential (Component)    Collection Time: 10/14/23  8:11 PM   Result Value    WBC 7.59    Hemoglobin 9.1 (L)    Hematocrit 27.8 (L)    Platelet Count 190    MPV 10.0    RBC 2.80 (L)    MCV 99.3 (H)    MCH 32.5    MCHC 32.7    RDW 13    nRBC % 0.0    Absolute nRBC 0.00    Preliminary Absolute Neutrophil Count 4.97    Neutrophils % 65.5    Lymphocytes % 18.4    Monocytes % 7.5    Eosinophils % 7.9    Basophils % 0.4    Immature Granulocytes % 0.3    Absolute Neutrophils 4.97    Absolute Lymphocytes 1.40    Absolute Monocytes 0.57    Absolute Eosinophils 0.60 (H)    Absolute Basophils 0.03    Absolute Immature Granulocytes 0.02   Urinalysis with Reflex to Microscopic Exam and Culture    Collection Time: 10/15/23  2:54 AM    Specimen: Urine, Clean Catch   Result Value    Urine Color Straw    Urine Clarity Clear    Urine Specific Gravity 1.014    Urine pH 6.5    Urine Leukocyte Esterase Negative    Urine Nitrite Negative    Urine Protein Negative    Urine Glucose Negative    Urine Ketones Negative    Urine Urobilinogen Normal    Urine Bilirubin Negative    Urine Blood Negative        EKG: NSR, non ischemic    Imaging personally reviewed, including: all available   CT Hip Right WO Contrast  Result Date: 10/14/2023  1.No evidence of acute bony injury of the right hip. Consider further corroboration with MR imaging to rule out occult fracture if patient has difficulty weightbearing. 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 Franky CHRISTELLA Louder, MD 10/14/2023 8:49 PM    CT Sinus Facial Bones without Contrast  Result Date:  10/14/2023   1.Right-sided facial soft tissue injury. 2.Comminuted fracture along the  left medial orbital wall/lamina papyracea with herniation of fat through the osseous defect. There is right-sided orbital emphysema and right-sided retrobulbar are soft tissue stranding. Additionally, note is made of a small hematoma within the left extraconal space adjacent to the medial rectus musculature. There is no evidence of muscular entrapment. 3. These urgent findings were relayed via secure EPIC message with and acknowledged by [facility] at 8:44 PM on 10/14/2023.  Butler Blanch, MD 10/14/2023 8:45 PM    CT Head without Contrast  Result Date: 10/14/2023   1.Right-sided facial soft tissue injury. Please see separate facial bone CT report. 2.No CT evidence of acute intracranial hemorrhage, herniation or hydrocephalus. 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 Butler Blanch, MD 10/14/2023 8:34 PM    CT Cervical Spine without Contrast  Result Date: 10/14/2023  1. No cervical spine fracture is detected. 2.  Multilevel cervical spondylosis.  Butler Blanch, MD 10/14/2023 8:31 PM          Please pardon any potential grammatic errors or typos as aspects of this note may have been created through speech to text software    Signed by: Kate SHAUNNA Mages, MD, MD   rr:[id number], Dorothyann BRAVO, NP         [1]   Allergies  Allergen Reactions    Fentanyl Anaphylaxis     Hives    Percolone [Oxycodone]

## 2023-10-15 NOTE — Progress Notes (Signed)
 Orthostatic BP done     10/15/23 1459 10/15/23 1459 10/15/23 1504   Vital Signs   Level of Consciousness Alert Alert Alert   Temp 98.1 F (36.7 C)  --   --    Temp src Oral  --   --    Heart Rate (!) 53 (!) 59 67   Resp Rate 18  --   --    BP 153/65 150/71 101/45   BP Location Left arm Left arm Left arm   MAP (mmHg) 95 97 (!) 64   Patient Position Lying Sitting Standing   Oxygen Therapy   SpO2 99 % 100 % 97 %   O2 Device None (Room air) None (Room air) None (Room air)

## 2023-10-15 NOTE — Plan of Care (Signed)
 Problem: Pain interferes with ability to perform ADL  Goal: Pain at adequate level as identified by patient  Outcome: Progressing  Flowsheets (Taken 10/15/2023 0146)  Pain at adequate level as identified by patient:   Identify patient comfort function goal   Assess for risk of opioid induced respiratory depression, including snoring/sleep apnea. Alert healthcare team of risk factors identified.   Assess pain on admission, during daily assessment and/or before any as needed intervention(s)   Reassess pain within 30-60 minutes of any procedure/intervention, per Pain Assessment, Intervention, Reassessment (AIR) Cycle   Evaluate if patient comfort function goal is met   Evaluate patient's satisfaction with pain management progress   Offer non-pharmacological pain management interventions   Include patient/patient care companion in decisions related to pain management as needed     Problem: Side Effects from Pain Analgesia  Goal: Patient will experience minimal side effects of analgesic therapy  Outcome: Progressing  Flowsheets (Taken 10/15/2023 0146)  Patient will experience minimal side effects of analgesic therapy:   Monitor/assess patient's respiratory status (RR depth, effort, breath sounds)   Assess for changes in cognitive function   Prevent/manage side effects per LIP orders (i.e. nausea, vomiting, pruritus, constipation, urinary retention, etc.)   Evaluate for opioid-induced sedation with appropriate assessment tool (i.e. POSS)     Problem: Moderate/High Fall Risk Score >5  Goal: Patient will remain free of falls  Outcome: Progressing  Flowsheets (Taken 10/15/2023 0100)  High (Greater than 13):   HIGH-Apply yellow Fall Risk arm band   HIGH-Consider use of low bed     Problem: Compromised Activity/Mobility  Goal: Activity/Mobility Interventions  Outcome: Progressing  Flowsheets (Taken 10/15/2023 0146)  Activity/Mobility Interventions: Pad bony prominences, TAP Seated positioning system when OOB, Promote PMP,  Reposition q 2 hrs / turn clock, Offload heels

## 2023-10-16 LAB — WHOLE BLOOD GLUCOSE POCT
Whole Blood Glucose POCT: 112 mg/dL — ABNORMAL HIGH (ref 70–100)
Whole Blood Glucose POCT: 144 mg/dL — ABNORMAL HIGH (ref 70–100)
Whole Blood Glucose POCT: 94 mg/dL (ref 70–100)
Whole Blood Glucose POCT: 244 mg/dL — ABNORMAL HIGH (ref 70–100)

## 2023-10-16 MED ORDER — MIDODRINE HCL 5 MG PO TABS
2.5000 mg | ORAL_TABLET | Freq: Three times a day (TID) | ORAL | Status: DC
Start: 2023-10-16 — End: 2023-10-17
  Administered 2023-10-17: 2.5 mg via ORAL
  Filled 2023-10-16 (×2): qty 1

## 2023-10-16 MED ORDER — LACTATED RINGERS IV BOLUS
500.0000 mL | Freq: Once | INTRAVENOUS | Status: AC
Start: 2023-10-16 — End: 2023-10-16
  Administered 2023-10-16: 500 mL via INTRAVENOUS
  Filled 2023-10-16: qty 500

## 2023-10-16 NOTE — Progress Notes (Signed)
 Orthostatic BP done     10/16/23 0958 10/16/23 1001 10/16/23 1003   Vital Signs   Level of Consciousness Alert  --  Alert   Heart Rate 65 71 80   BP 170/62 125/49 100/62   MAP (mmHg) 98 75 75   Patient Position Lying Sitting Standing   Oxygen Therapy   SpO2 97 %  --   --    O2 Device None (Room air)  --   --

## 2023-10-16 NOTE — Progress Notes (Addendum)
 Lactated ringers  bolus given. Orthostatic BP rechecked per MD's order.     10/16/23 1606 10/16/23 1607 10/16/23 1609   Vital Signs   Level of Consciousness Alert Alert Alert   Temp 98.1 F (36.7 C)  --   --    Temp src Oral  --   --    Heart Rate 62 77 77   Resp Rate 14  --   --    BP 161/50 101/55 101/49   MAP (mmHg) 87 70 66   Patient Position Lying Sitting Standing   Oxygen Therapy   SpO2 96 % 92 %  --    O2 Device None (Room air) None (Room air)  --

## 2023-10-16 NOTE — Nursing Progress Note (Signed)
 Patient is alert and oriented x4. Vital signs stable, on room air, no acute distress. Scheduled medication given. Patient complained of right eye pain. Tramadol  PRN given with positive effect. Orthostatic BP done. Lactated ringers  given as ordered. Blood glucose check done. Meals served and well tolerated. Visual acuity done, patient able to read words but states slightly blurry in the right eye. MD aware. Bacitracin applied to right eye laceration and dressing on right upper eye brown changed. Patient able to dangle at bedside and stand this shift. Bed in the lowest position, call light within reach and all safety precautions in place. Purposeful rounding done.     Problem: Pain interferes with ability to perform ADL  Goal: Pain at adequate level as identified by patient  Outcome: Progressing  Pain at adequate level as identified by patient:   Identify patient comfort function goal   Assess pain on admission, during daily assessment and/or before any as needed intervention(s)   Reassess pain within 30-60 minutes of any procedure/intervention, per Pain Assessment, Intervention, Reassessment (AIR) Cycle   Evaluate if patient comfort function goal is met     Problem: Moderate/High Fall Risk Score >5  Goal: Patient will remain free of falls  Outcome: Progressing  High (Greater than 13): HIGH-Consider use of low bed     Problem: Safety  Goal: Patient will be free from injury during hospitalization  Outcome: Progressing  Patient will be free from injury during hospitalization:   Assess patient's risk for falls and implement fall prevention plan of care per policy   Provide and maintain safe environment   Hourly rounding   Provide alternative method of communication if needed (communication boards, writing)  Goal: Patient will be free from infection during hospitalization  Outcome: Progressing  Free from Infection during hospitalization:   Assess and monitor for signs and symptoms of infection   Monitor lab/diagnostic  results     Problem: Diabetes: Glucose Imbalance  Goal: Blood glucose stable at established goal  Outcome: Progressing  Blood glucose stable at established goal:   Monitor lab values   Assess for hypoglycemia /hyperglycemia   Monitor/assess vital signs     Problem: Impaired Mobility  Goal: Mobility/Activity is maintained at optimal level for patient  Outcome: Progressing  Mobility/activity is maintained at optimal level for patient:   Encourage independent activity per ability   Consult/collaborate with Physical Therapy and/or Occupational Therapy     Geriatric Focused Assessment    Is the patient age 29 or older?Yes    Sleep:   Does the patient have an ineffective sleeping pattern? No      Problems with feeding:    Does the patient have an adequate appetite? Yes   Is the patient able to feed themself? Yes    Incontinence:   Is the patient incontient of bowel or bladder? Yes    Confusion:   Does the patient have any signs or symptoms of deliruim? No      Evidence of falls:   Is the patient a documented high fall risk? Yes    Skin Breakdown:   Does the patient have any areas of skin breakdown? Yes   Are there any risk factors? Yes

## 2023-10-16 NOTE — OT Progress Note (Signed)
 [facility]  Occupational Therapy Attempt Note    Patient:  Vanessa Kelly MRN#:  [id number]    Unit:  3B ONCOLOGY GENERAL MEDICINE Room/Bed:  M330/M330-02      OT Cancellation: Visit  OT Visit Cancellation Reason: Patient/caregiver declines therapy at this time (pt refuses reports there's too much going on, she's exhausted and it would be better if OT follow-up after 10AM tomorrow. Will continue to follow.)           Signature:   Semaja Lymon, OT  10/16/2023  2:10 PM

## 2023-10-16 NOTE — Plan of Care (Signed)
 Problem: Pain interferes with ability to perform ADL  Goal: Pain at adequate level as identified by patient  Outcome: Progressing     Problem: Side Effects from Pain Analgesia  Goal: Patient will experience minimal side effects of analgesic therapy  Outcome: Progressing     Problem: Moderate/High Fall Risk Score >5  Goal: Patient will remain free of falls  Outcome: Progressing     Problem: Compromised Activity/Mobility  Goal: Activity/Mobility Interventions  Outcome: Progressing     Problem: Safety  Goal: Patient will be free from injury during hospitalization  Outcome: Progressing  Goal: Patient will be free from infection during hospitalization  Outcome: Progressing     Problem: Compromised Friction/Shear  Goal: Friction and Shear Interventions  Outcome: Progressing     Problem: Compromised Sensory Perception  Goal: Sensory Perception Interventions  Outcome: Progressing     Problem: Compromised Moisture  Goal: Moisture level Interventions  Outcome: Progressing

## 2023-10-16 NOTE — Progress Notes (Signed)
 CM, completed an initial assessment. CM`s role discussed in regards to [place] planning. Demographics verified and patient states she saw her PCP last week in house at the ALF.  CM inquired if the patient has health insurance? Per the patient she has Medicare A & B.     CM, spoke to the patient regarding her care, per the patient lives she lives at [facility] and has Hx of falls. Per the patient she needs assistance with all ADL`s prior to this hospital admission. Per the patient she ambulates with a Rolator/DME. CM inquired about AD per the patient she doesn't has a POA, but not with her.    DCP: Back to her ALF pending her medical course, Transportation Family       10/16/23 1448   Patient Type   Within 30 Days of Previous Admission? No   Healthcare Decisions   Interviewed: Patient   Orientation/Decision Making Abilities of Patient Alert and Oriented x3, able to make decisions   Advance Directive Patient has advance directive, copy not in chart   Advance Directive not in Chart Copy requested from family/decision maker   Healthcare Agent Appointed No   Prior to admission   Prior level of function Ambulates with assistive device;Needs assistance with ADLs   Type of Residence Assisted living  [facility] rehab)   Home Layout One level   Have running water , electricity, heat, etc? Yes   Living Arrangements Children   How do you get to your MD appointments? Virtual MD meetings   How do you get your groceries? Family   Who fixes your meals? Facility   Who does your laundry? Facility   Who picks up your prescriptions? Facility   Dressing Needs assistance   Grooming Needs assistance   Feeding Independent   Bathing Needs assistance   Toileting Needs assistance   DME Currently at [employer], [employer];Wheelchair, Manual   Discharge Planning   Support Systems Children   Expected Discharge Disposition ALF   Anticipated [place] plan discussed with: Same as interviewed   Mode of  transportation: Medicaid transport  (Ambulance)   Does the patient have perscription coverage? Yes   Financial Resource Strain   How hard is it for you to pay for the very basics like food, housing, medical care, and heating? Not hard   Housing Stability   In the last 12 months, was there a time when you were not able to pay the mortgage or rent on time? N   In the past 12 months, how many times have you moved where you were living? 0   At any time in the past 12 months, were you homeless or living in a shelter (including now)? N   Transportation Needs   In the past 12 months, has lack of transportation kept you from medical appointments or from getting medications? no   In the past 12 months, has lack of transportation kept you from meetings, work, or from getting things needed for daily living? No   Consults/Providers   PT Evaluation Needed 1   OT Evalulation Needed 1   SLP Evaluation Needed 2   Correct PCP listed in Epic? Yes   Family and PCP   PCP on file was verified as the current PCP? Yes   In case you are admitted, transferred or discharged, would like family notified? Yes   Name of family member to be notified Bernice daughter   In  case you are admitted, transferred or discharged, would like your PCP notified? Yes

## 2023-10-16 NOTE — Progress Notes (Signed)
 [facility]  Internal Medicine Hospitalists  Progress Note        Assessment / Plan:        Vanessa Kelly is a 83 y.o. female with PMH of syncope, orthostatic hypotension, CAD s/p CABG, HTN, HLD, DM2, anemia, GERD presents after fall vs syncope with head trauma at nursing home.  Found to have right orbital wall fracture    #right orbital fracture  #epistaxis, resolved  #supraorbital laceration   CT facial bones- Right-sided facial soft tissue injury.  2.Comminuted fracture along the left medial orbital wall/lamina papyracea with herniation of fat through the osseous defect. There is right-sided orbital emphysema and right-sided retrobulbar are soft tissue stranding. Additionally, note is made of a small hematoma within the left extraconal space adjacent to the medial rectus musculature. There is no evidence of muscular entrapment.  -ophthalmology consulted, recs no acute intervention, vision acuity and tonometry checks, outpatient follow up    -- symptoms stable     #mechanical fall vs syncope  #history of syncope d/t orthostatic hypotension   #right hip pain  CTH- negative  CT cervical- negative  CT right hip -negative for acute pathology   EKG- NSR, non ischemic   -orthostatics positive.  Patient reports using compression stockings and midodrine  as needed at home.  Will give 500 cc IV fluid, compression stocking, and reassess orthostatic vital signs.  Adjust midodrine  as needed  -telemetry  -TTE shows LVEF 69% and no significant valvulopathy  -fall precautions  -PT/OT eval      #HTN, likely d/t pain  #hx of labile BP   -midodrine  PRN for SBP <110  -monitor      #CKD3  Renally dose medications and avoid nephrotoxins     #CAD, s/p CABG  #HLD  -continue aspirin , statin, ranolazine      #GERD  -continue PPI     #anemia, chronic  Hb 9.5, at baseline     #DM2  No meds currently.     #hx of right breast cancer     VTE Prophylaxis: enoxaparin  (LOVENOX ) syringe 40 mg       Foley Catheter: No Foley  Present    Venous Access: No Temporary Central Line Present    Medical Readiness for Discharge: Anticipated in 2-4 Days    Open Handoff Activity in Sidebar    Additional Diagnoses:               Subjective:      Patient continues to have lightheadedness when standing up.  She has chronic orthostatic hypotension but it is a bit worse right now    Patient denies any changes to vision since yesterday.  She is able to read letters on the wall of her room.  Vision is slightly blurry due to the increased hearing.  She denies any eye pain       Objective:      Temp:  [97.3 F (36.3 C)-98.2 F (36.8 C)] 98.1 F (36.7 C)  Heart Rate:  [54-80] 77  Resp Rate:  [14-20] 14  BP: (100-190)/(47-78) 101/49   General: WD female in no acute distress.  HEENT: R periorbital ecchymoses, R conjunctiva red diffusely, EOMI  Neck: no JVD noted  Lungs: normal WOB  Abdomen: Soft, NTND  Extremities: WWP, trace edema  Neuro: AAOx3, no gross focal deficits, visual acuity grossly normal   Skin: No rashes noted on exposed skin

## 2023-10-16 NOTE — PT Plan of Care Note (Signed)
 [facility]  Physical Therapy Attempt Note    Patient:  Vanessa Kelly MRN#:  [id number]    Unit:  3B ONCOLOGY GENERAL MEDICINE Room/Bed:  M330/M330-02    PT Cancellation: Visit  PT Visit Cancellation Reason: Patient/caregiver declines therapy at this time       Per OT pt refusing therapy at this time, requests therapy come back in AM tomorrow (after 10am).    Signature:   Paiden Cavell C Claiborn-Zacot, PT  10/16/2023  2:09 PM       (For scheduling questions, please contact rehab tech x [phone])

## 2023-10-17 ENCOUNTER — Inpatient Hospital Stay

## 2023-10-17 LAB — WHOLE BLOOD GLUCOSE POCT
Whole Blood Glucose POCT: 119 mg/dL — ABNORMAL HIGH (ref 70–100)
Whole Blood Glucose POCT: 107 mg/dL — ABNORMAL HIGH (ref 70–100)
Whole Blood Glucose POCT: 146 mg/dL — ABNORMAL HIGH (ref 70–100)
Whole Blood Glucose POCT: 120 mg/dL — ABNORMAL HIGH (ref 70–100)

## 2023-10-17 LAB — URINALYSIS WITH REFLEX TO MICROSCOPIC EXAM - REFLEX TO CULTURE
Urine Bilirubin: NEGATIVE
Urine Blood: NEGATIVE
Urine Glucose: NEGATIVE
Urine Ketones: NEGATIVE mg/dL
Urine Leukocyte Esterase: NEGATIVE
Urine Nitrite: NEGATIVE
Urine Protein: NEGATIVE
Urine Specific Gravity: 1.012 (ref 1.001–1.035)
Urine Urobilinogen: NORMAL mg/dL (ref 0.2–2.0)
Urine pH: 6 (ref 5.0–8.0)

## 2023-10-17 LAB — LAB USE ONLY - URINE GRAY CULTURE HOLD TUBE

## 2023-10-17 MED ORDER — POLYETHYLENE GLYCOL 3350 17 G PO PACK
17.0000 g | PACK | Freq: Every day | ORAL | Status: DC
Start: 2023-10-17 — End: 2023-10-19
  Administered 2023-10-17 – 2023-10-19 (×3): 17 g via ORAL
  Filled 2023-10-17 (×3): qty 1

## 2023-10-17 MED ORDER — MIDODRINE HCL 5 MG PO TABS
2.5000 mg | ORAL_TABLET | Freq: Every day | ORAL | Status: DC
Start: 2023-10-18 — End: 2023-10-19

## 2023-10-17 MED ORDER — MIDODRINE HCL 5 MG PO TABS
2.5000 mg | ORAL_TABLET | Freq: Every morning | ORAL | Status: DC
Start: 2023-10-18 — End: 2023-10-19
  Administered 2023-10-18: 2.5 mg via ORAL
  Filled 2023-10-17 (×2): qty 1

## 2023-10-17 NOTE — Plan of Care (Addendum)
 Shift summary [id number]    Neuro: A&O x 4  Vitals: stable, except fluctuating BP   Resp: WDL, on RA Cardio: WDL, no Tele, pt denies chest pain   GU: voiding freely, external cath in place, perineal care done, adequate urine output  GI:  - N/V, + flatus, - BM , laxative given  Diet: cardiac/renal, tolerated well with no evidence of dysphasia, meds given as orders with whole pills   Blood sugar check: Yes ACHS  Musk: Pt ambulates with walker and x1 assist, monitor orthostatic BP  Integumentary: See details on flowsheet and LDA  IV: intact, flushed. Saline locked.   Pain control: Pt reported lower abdominal pain when moving and palpating. Tylenol  given. Educated on pain management  Safety: Purposeful rounding performed. Bed is in the lowest position.   Call light is within reach. Rails up x3.  Pt was seen/examined by Dr.     Blood pressure low this morning during PT session due to pt refused Midodrine    Blood pressure high afternoon after taking midodrine  2.5mg    MD aware, recommend midodrine  in morning and afternoon. Sleep 30 to 45 degrees. Avoid lying down during the day.   Performed bladder scan, KUB, urinalysis.    Geriatric Focused Assessment    Is the patient age 5 or older?Yes    Sleep:   Does the patient have an ineffective sleeping pattern? No      Problems with feeding:    Does the patient have an adequate appetite? Yes   Is the patient able to feed themself? Yes    Incontinence:   Is the patient incontient of bowel or bladder? Yes    Confusion:   Does the patient have any signs or symptoms of deliruim? No      Evidence of falls:   Is the patient a documented high fall risk? Yes    Skin Breakdown:   Does the patient have any areas of skin breakdown? No     Are there any risk factors? Yes      Problem: Pain interferes with ability to perform ADL  Goal: Pain at adequate level as identified by patient  Outcome: Progressing  Flowsheets (Taken 10/17/2023 0820)  Pain at adequate level as identified by patient:    Identify patient comfort function goal   Assess for risk of opioid induced respiratory depression, including snoring/sleep apnea. Alert healthcare team of risk factors identified.   Assess pain on admission, during daily assessment and/or before any as needed intervention(s)   Reassess pain within 30-60 minutes of any procedure/intervention, per Pain Assessment, Intervention, Reassessment (AIR) Cycle   Evaluate if patient comfort function goal is met   Evaluate patient's satisfaction with pain management progress   Offer non-pharmacological pain management interventions     Problem: Side Effects from Pain Analgesia  Goal: Patient will experience minimal side effects of analgesic therapy  Outcome: Progressing  Flowsheets (Taken 10/17/2023 0820)  Patient will experience minimal side effects of analgesic therapy:   Monitor/assess patient's respiratory status (RR depth, effort, breath sounds)   Assess for changes in cognitive function   Prevent/manage side effects per LIP orders (i.e. nausea, vomiting, pruritus, constipation, urinary retention, etc.)   Evaluate for opioid-induced sedation with appropriate assessment tool (i.e. POSS)     Problem: Moderate/High Fall Risk Score >5  Goal: Patient will remain free of falls  Outcome: Progressing  Flowsheets (Taken 10/17/2023 1400)  High (Greater than 13):   HIGH-Consider use of low bed   HIGH-Apply yellow  Fall Risk arm band   HIGH-Visual cue at entrance to patient's room   MOD-Remain with patient during toileting   MOD-Use of assistive devices -Bedside Commode if appropriate   MOD-Utilize diversion activities   MOD-Perform dangle, stand, walk (DSW) prior to mobilization   MOD-Request PT/OT consult order for patients with gait/mobility impairment   MOD-Include family in multidisciplinary POC discussions   MOD-Place Fall Risk level on whiteboard in room     Problem: Compromised Activity/Mobility  Goal: Activity/Mobility Interventions  Outcome: Progressing  Flowsheets (Taken  10/17/2023 0920)  Activity/Mobility Interventions: Pad bony prominences, TAP Seated positioning system when OOB, Promote PMP, Reposition q 2 hrs / turn clock, Offload heels     Problem: Compromised Friction/Shear  Goal: Friction and Shear Interventions  Outcome: Progressing  Flowsheets (Taken 10/17/2023 2024)  Friction and Shear Interventions: Pad bony prominences, Off load heels, HOB 30 degrees or less unless contraindicated, Consider: TAP seated positioning, Heel foams     Problem: Compromised Sensory Perception  Goal: Sensory Perception Interventions  Outcome: Progressing  Flowsheets (Taken 10/15/2023 2100 by Blaise Jamee Broody, RN)  Sensory Perception Interventions: Offload heels, Pad bony prominences, Reposition q 2hrs/turn Clock, Q2 hour skin assessment under devices if present     Problem: Compromised Moisture  Goal: Moisture level Interventions  Outcome: Progressing  Flowsheets (Taken 10/17/2023 0920)  Moisture level Interventions: Moisture wicking products, Moisture barrier cream     Problem: Safety  Goal: Patient will be free from injury during hospitalization  Outcome: Progressing  Flowsheets (Taken 10/17/2023 0016 by Jerrye Graves, RN)  Patient will be free from injury during hospitalization:   Assess patient's risk for falls and implement fall prevention plan of care per policy   Include patient/ family/ care giver in decisions related to safety   Ensure appropriate safety devices are available at the bedside   Provide and maintain safe environment   Hourly rounding  Goal: Patient will be free from infection during hospitalization  Outcome: Progressing  Flowsheets (Taken 10/16/2023 1418 by Lucio Pao, RN)  Free from Infection during hospitalization:   Assess and monitor for signs and symptoms of infection   Monitor lab/diagnostic results     Problem: Impaired Mobility  Goal: Mobility/Activity is maintained at optimal level for patient  Outcome: Progressing  Flowsheets (Taken 10/17/2023 0016 by Jerrye Graves, RN)  Mobility/activity is maintained at optimal level for patient: Encourage independent activity per ability     Problem: Diabetes: Glucose Imbalance  Goal: Blood glucose stable at established goal  Outcome: Progressing  Flowsheets (Taken 10/16/2023 1419 by Lucio Pao, RN)  Blood glucose stable at established goal:   Monitor lab values   Assess for hypoglycemia /hyperglycemia   Monitor/assess vital signs

## 2023-10-17 NOTE — Progress Notes (Addendum)
 10/17/23 1520 10/17/23 1521 10/17/23 1524   Vital Signs   BP 190/62 160/69 166/67   BP Location Left arm Right arm Right arm   BP Method Automatic Automatic Automatic   MAP (mmHg)  --   --  100   Patient Position Lying Lying Sitting      10/17/23 1525 10/17/23 1528 10/17/23 1530   Vital Signs   BP 194/70 (!) 213/69 (!) 196/39   BP Location Right arm Right arm Right arm   BP Method Automatic Automatic Automatic   MAP (mmHg) (!) 111 (!) 117 91   Patient Position Standing Sitting Lying     Sitting and standing with abdominal binder

## 2023-10-17 NOTE — Progress Notes (Addendum)
 Orthostatic BP Assessment During PT/OT Co-Evaluation     10/17/23 1121   Vital Signs   Heart Rate 72   BP 179/65   BP Location Left arm   BP Method Automatic   MAP (mmHg) 103   Patient Position Lying  (HOB flat; knee high ted hose in place)      10/17/23 1124 10/17/23 1133 10/17/23 1142   Vital Signs   Heart Rate 72 84 68   BP 122/67 93/58 190/77   BP Location Left arm Left arm Left arm   BP Method Automatic Automatic Automatic   MAP (mmHg) 86 70 (!) 115   Patient Position Sitting  (at EOB unsupported; knee high ted hose in place) Standing  (with abdominal binder and ted hose in place; standing ~2-3 minutes prior to BP assessed) Sitting  (s/p seated BLE exercises completed. Abdominal binder and ted hose in place)      10/17/23 1154   Vital Signs   Heart Rate 87   BP 171/62   BP Location Left arm   BP Method Automatic   MAP (mmHg) 98   Patient Position Sitting  (s/p amb 20 ft, toilet transfer (did NOT void or have BM), amb 5 ft) Abdominal binder and knee high ted hose in place.      Pt asymptomatic throughout session (regarding dizziness, lightheadedness). Full PT/OT notes to follow.     Vanessa Kelly, PT, DPT   10/17/2023  1:20 PM  ([phone] for Rehab Tech/scheduling questions)

## 2023-10-17 NOTE — Plan of Care (Signed)
 Trio rounding template   10/17/2023    - Brief  background -Vanessa Kelly is a 83 y.o. female with injury of head  (why Pt came to hospital, and why they are still here )   -Last 24 Hrs (any critical events ,abnormal VS, treatment, trend ): none  -Respiratory (changes in oxygenation): none  -Cardiac Rhythm - if changes-  none  - Weight trend if applicable - none  -GI/GU (changes in out put, BM, Foley): none  -Pain:acetaminophen   -Nutrition (diabetic, NPO): heart health and renal  -Activity / Safety/ mobility  questions (PMP, need for PT/ OT , sitter) : none  -Upcoming Procedures :  none  -Discharge Date/ Plans:  TBD/  awaiting acute rehab placement  -Questions/ orders needed  none    Note : Night shift to complete prior to Trio rounding   Joellen Cave, RN

## 2023-10-17 NOTE — Progress Notes (Addendum)
 [facility]  Internal Medicine Hospitalists  Progress Note        Assessment / Plan:        Vanessa Kelly is a 83 y.o. female with PMH of syncope, orthostatic hypotension, CAD s/p CABG, HTN, HLD, DM2, anemia, GERD presents after fall vs syncope with head trauma at nursing home.  Found to have right orbital wall fracture    Remains hospitalized for severe orthostatic hypotension resulting in recent falls at home    #mechanical fall vs syncope  #history of syncope d/t orthostatic hypotension  #Multiple falls at home  #right hip pain  #Supine hypertension   CTH- negative  CT cervical- negative  CT right hip -negative for acute pathology   -TTE shows LVEF 69% and no significant valvulopathy  -fall precautions  -PT/OT eval --> SNF  -6/16: orthostatics positive.  Patient reports using compression stockings and midodrine  as needed at home.  Will give 500 cc IV fluid, compression stocking, abdominal binder, and reassess orthostatic vital signs.  Adjust midodrine  as needed    -6/17: orthostatics remain positive but patient refused morning midodrine . Pretty significant supine hypertension noted, limiting treatment. Will continue with midodrine  in the morning and afternoon -- hold night dose to minimize supine hypertension.  Patient should sleep at 30 to 45 degrees to minimize supine hypertension. Patient should keep bedside commode after discharge to avoid walking Kelly distances at nighttime when she is not on midodrine .  Patient should avoid lying down during the day.  I will consult cardiology for further recommendations    #lower abdominal pain   - Patient reports lower abdominal pain with movement and on palpation which started right after she worked with physical therapy.  She has not had a bowel movement in several days.  No nausea or vomiting.  No fevers or chills.  No urinary symptoms.  - Start with bladder scan, KUB, urinalysis.  Pain is only on palpation so lower suspicion for acute  inflammatory process of the abdomen.  Muscle strain is in the differential    #right orbital fracture  #epistaxis, resolved  #supraorbital laceration   CT facial bones- Right-sided facial soft tissue injury.  2.Comminuted fracture along the left medial orbital wall/lamina papyracea with herniation of fat through the osseous defect. There is right-sided orbital emphysema and right-sided retrobulbar are soft tissue stranding. Additionally, note is made of a small hematoma within the left extraconal space adjacent to the medial rectus musculature. There is no evidence of muscular entrapment.  -ophthalmology consulted, recs outpatient follow up. Visual acuity has been stable -- patient reports improving visual acuity -- sometimes blurry vision related to clearing which improves with blinking. Swelling improving       #CKD3  Renally dose medications and avoid nephrotoxins     #CAD, s/p CABG  #HLD  -continue aspirin , statin, ranolazine      #GERD  -continue PPI     #anemia, chronic  Hb 9.5, at baseline     #DM2  No meds currently.     #hx of right breast cancer     VTE Prophylaxis: enoxaparin  (LOVENOX ) syringe 40 mg       Foley Catheter: No Foley Present    Venous Access: No Temporary Central Line Present    Medical Readiness for Discharge: Anticipated in 2-4 Days    Open Handoff Activity in Sidebar    Additional Diagnoses:               Subjective:  Ongoing orthostatic hypotension, not very symptomatic today  however    Lower abdominal pain started today  after working with PT    Vision is better than yesterday, no pain in the eye unless turns head to the right        Objective:      Temp:  [97.2 F (36.2 C)-97.5 F (36.4 C)] 97.2 F (36.2 C)  Heart Rate:  [58-87] 68  Resp Rate:  [13-17] 14  BP: (93-213)/(39-80) 196/39   General: WD female in no acute distress.  HEENT: R periorbital ecchymoses, R conjunctiva red diffusely improving, EOMI  Neck: no JVD noted  Lungs: normal WOB  Abdomen: Soft, lower abdomen tenderness  - no peritoneal signs   Extremities: WWP, trace edema  Neuro: AAOx3, no gross focal deficits, visual acuity grossly normal   Skin: No rashes noted on exposed skin

## 2023-10-17 NOTE — Nursing Progress Note (Signed)
Geriatric Focused Assessment    Is the patient age 85 or older?Yes    Sleep:   Does the patient have an ineffective sleeping pattern? No      Problems with feeding:    Does the patient have an adequate appetite? Yes   Is the patient able to feed themself? Yes    Incontinence:   Is the patient incontient of bowel or bladder? Yes    Confusion:   Does the patient have any signs or symptoms of deliruim? No      Evidence of falls:   Is the patient a documented high fall risk? Yes    Skin Breakdown:   Does the patient have any areas of skin breakdown? No     Are there any risk factors? No

## 2023-10-17 NOTE — Plan of Care (Signed)
 Patient is alert and oriented times 4. Complained of pain and received PRN pain meds with good effect. Visual acuity and orthostatic BP completed. Wound dressing changed. Purposeful rounding was performed. Bed is in the lowest position and call light is within reach.   Problem: Pain interferes with ability to perform ADL  Goal: Pain at adequate level as identified by patient  Outcome: Progressing  Flowsheets (Taken 10/17/2023 0016)  Pain at adequate level as identified by patient:   Identify patient comfort function goal   Assess pain on admission, during daily assessment and/or before any as needed intervention(s)   Evaluate if patient comfort function goal is met   Evaluate patient's satisfaction with pain management progress     Problem: Side Effects from Pain Analgesia  Goal: Patient will experience minimal side effects of analgesic therapy  Outcome: Progressing  Flowsheets (Taken 10/17/2023 0016)  Patient will experience minimal side effects of analgesic therapy:   Monitor/assess patient's respiratory status (RR depth, effort, breath sounds)   Assess for changes in cognitive function   Prevent/manage side effects per LIP orders (i.e. nausea, vomiting, pruritus, constipation, urinary retention, etc.)     Problem: Moderate/High Fall Risk Score >5  Goal: Patient will remain free of falls  Outcome: Progressing     Problem: Safety  Goal: Patient will be free from injury during hospitalization  Outcome: Progressing  Flowsheets (Taken 10/17/2023 0016)  Patient will be free from injury during hospitalization:   Assess patient's risk for falls and implement fall prevention plan of care per policy   Include patient/ family/ care giver in decisions related to safety   Ensure appropriate safety devices are available at the bedside   Provide and maintain safe environment   Hourly rounding     Problem: Impaired Mobility  Goal: Mobility/Activity is maintained at optimal level for patient  Outcome: Progressing  Flowsheets  (Taken 10/17/2023 0016)  Mobility/activity is maintained at optimal level for patient: Encourage independent activity per ability

## 2023-10-17 NOTE — PT Eval Note (Signed)
 Physical Therapy Evaluation  Vanessa Kelly      Post Acute Care Therapy Recommendations:     Discharge Recommendations:  SNF    If SNF  recommended discharge disposition is not available, patient will need hands on assist for all OOB mobility and increased caregiver support/supervision, HHPT.     DME needs IF patient is discharging home: Front wheel walker, [facility]    Therapy discharge recommendations may change with patient status.  Please refer to most recent note for up-to-date recommendations.           Vanessa Kelly  [address]  [place], [place] [address]  [phone]    Physical Therapy Evaluation    Patient: Vanessa Kelly MRN: [id number]   Unit: 3B ONCOLOGY GENERAL MEDICINE Bed: M330/M330-02    Time of Treatment:   PT Received On: 10/17/23  Start Time: 1116  Stop Time: 1212  Time Calculation (min): 56 min       Consult received for Vanessa Kelly for PT evaluation and treatment.  Patient's medical condition is appropriate for Physical Therapy  intervention at this time.      Assessment   Vanessa Kelly is a 83 y.o. female admitted 10/14/2023 via EMS from ALF s/p fall with rollator resulting in R facial soft tissue injury, R supraorbital facial laceration, R orbital fracture. Pt reports she felt shaky, weak, and dizzy leading up to the fall losing balance and falling forward; pt unsure if she lost consciousness, but might have briefly.  Pt with chronic history of orthostatic hypotension and history of syncope, R shoulder replacement, R neck pain (unrelated to fall). Pt also reporting R hip pain upon arrival; imaging (-) for acute processes. Pt reports to PT/OT at least 6 falls (~1 per month) in the last 6 months with similar mechanism of injury.      Pt semi-supine in bed with pt daughter, Vanessa Kelly, at bedside upon arrival. Pt observed to have edema and ecchymosis R upper face/eye. Co-evaluation with OT as pt refused PT/OT with particular times she wanted to be seen yesterday 6/16;  questionable if pt would participate in two separate evaluations this date. Each discipline focusing on discipline specific goals. Pt completes all mobility with SBA/CGA and increased time. Pt denies any lightheadedness, dizziness, or uneasy/altered feeling throughout session. Pt does report acute L anterior hip/pelvis/groin pain worse with seated LLE hip flexion, LLE LAQ, and ambulation. Pt vague when describing symptoms and defers follow up/clarifying questions; most consistently describes a cramping/grabbing sensation. Pt winces/gasps at times when bothersome. Attending notified. Pt with labile BP throughout session; knee high ted hose and abdominal binder in place as described below (objective section). Anticipate pt will tolerate multiple sessions in future sessions. Pt is high risk of falls in setting of extensive, recent fall history.         Pt's functional mobility is impacted by:  decreased activity tolerance, decreased balance, decreased bed mobility, abnormal blood pressure, gait impairment, orthostatic blood pressure, pain, and transfers .  There are a few comorbidities or other factors that affect plan of care and require modification of task including: frequent falls.  Standardized tests and exams incorporated into evaluation include AMPAC mobility, balance, cognition/orientation, coordination, ROM , and Strength.  Pt demonstrates a evolving clinical presentation due to abnormal vital sign response to activity.   Pt would continue to benefit from PT to address these deficits and increase functional independence.     Complexity Level Hx and Co  morbidites  Examination Clinical Decision Making Clinical Presentation   Moderate   1-2 factors 3 or more   Several options Evolving, plan may alter       PMP - Progressive Mobility Protocol   PMP Activity: Step 6 - Walks in Room  Distance Walked (ft) (Step 6,7): 20 Feet (x2 reps)       Rehabilitation Potential: Good       Interdisciplinary Communication: RN, OT,  attending; secure chat with CM and physician advisor      Plan     Plan  Risks/Benefits/POC Discussed with Pt/Family: With patient/family  Treatment/Interventions: Exercise;Gait training;Stair training;Neuromuscular re-education;Functional transfer training;LE strengthening/ROM;Endurance training;Bed mobility;Equipment eval/education;Patient/family training  PT Frequency: 3-4x/wk         Medical Diagnosis: Facial laceration, initial encounter [S01.81XA]  Injury of head, initial encounter [S09.90XA]  Closed fracture of orbit, initial encounter (CMS/HCC) [S02.85XA]  Hypertension, unspecified type [I10]    History of Present Illness: Vanessa Kelly is a 83 y.o. female admitted on  10/14/2023  who presents with a fall resulting in facial and hip pain.     She attempted to stand using her rollator and felt shaky and disconnected, leading to a loss of balance and a fall onto her face. She is unsure about losing consciousness but thinks it might have been brief. She was unable to walk immediately after the fall and feels detached, expressing concern about falling again. She holds onto objects to prevent another fall.     She experiences hip pain, particularly when trying to move the leg, but there is no pain with pressure on the hip or knee bending. She has difficulty moving the leg due to the hip pain.     She has persistent epistaxis that is difficult to control. The noise from the fall alarmed those around her.     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 She is currently taking aspirin  per ED provider note 10/15/23.     Per H&P note 10/15/23 Vanessa Kelly is a 83 y.o. female with PMH of syncope, orthostatic hypotension, CAD s/p CABG, HTN, HLD, DM2, GERD presents after fall vs syncope with head trauma at nursing home. Pt states she was trying to stand up from her rolator, she left weak and dizzy as she was standing up, lost her balance  and fell forward onto her face. She had blood coming from her right eye, nose and mouth. Also endorses right hip pain. She does not know if she lost consciousness but not remember the total event. No reports confusion, seizure activity, incontinence. Reports feeling generally weak, no focal weakness. On aspirin , no AC. Endorses dysuria. Denies headache, blurry vision, vertigo, pain with eye movements, chest pain, sob, palpitations, abd pain, nausea, vomiting, fevers, chills, constipations, diarrhea.      Problem List[1]  Medical History[2]  Past Surgical History[3]    Tests/Labs:  Lab Results   Component Value Date/Time    HGB 9.5 (L) 10/15/2023 06:06 AM    HGB 10.4 (L) 06/28/2022 06:18 AM    HCT 28.5 (L) 10/15/2023 06:06 AM    HCT 31.8 (L) 06/28/2022 06:18 AM    K 5.3 10/15/2023 06:06 AM    K 4.5 06/28/2022 06:18 AM    NA 136 10/15/2023 06:06 AM    NA 140 06/28/2022 06:18 AM    INR 1.2 07/15/2023 01:04 PM    TROPI 6.8 08/18/2023 03:20 AM    TROPI  6.4 08/17/2023 09:41 PM    TROPI 13.9 07/15/2023 04:17 PM    TROPI 18.6 (H) 07/15/2023 01:04 PM    TROPI 6.7 06/23/2022 06:41 PM    TROPI <2.7 01/18/2022 05:15 PM    TROPI <0.01 01/21/2021 02:30 PM         Imaging   CT Sinus Facial Bones without Contrast  Addendum Date: 10/17/2023  ADDENDUM: This addendum is to clarify a typographical error in the initial dictation.  The error occurs in the [facility] IMPRESSIONsection of the report. The following text appeared:  FINDINGS: There is a right frontal and right periorbital soft tissue swelling and hematoma extending over the right lateral orbital rim. There is orbital emphysema. There is right-sided retrobulbar soft tissue and infiltration. There is comminuted fracture along the left medial orbital wall/lamina papyracea with herniation of fat through the osseous defect. Additionally, note is made of a small hematoma within the left extraconal space adjacent to the medial rectus musculature. There is no evidence of muscular  entrapment. The left orbit is intact. There is marked thickening of the ventral and posterolateral wall of the right maxillary with opacification of the visualized right maxillary sinus. The zygomatic arches, pterygoid plates and hard palate are intact. The nasal bones and bridge are intact. The nasal septum is midline. Both mandibular condyles are well articulated at the temporomandibular joints. The middle ear cavities and mastoid air cells are clear. IMPRESSION: 1.Right-sided facial soft tissue injury. 2.Comminuted fracture along the left medial orbital wall/lamina papyracea with herniation of fat through the osseous defect. There is right-sided orbital emphysema and right-sided retrobulbar are soft tissue stranding. Additionally, note is made of a small hematoma within the left extraconal space adjacent to the medial rectus musculature. There is no evidence of muscular entrapment. 3. These urgent findings were relayed via secure EPIC message with and acknowledged by [facility] at 8:44 PM on 10/14/2023.  The corrected text should read:   FINDINGS: There is a right frontal and right periorbital soft tissue swelling and hematoma extending over the right lateral orbital rim. There is orbital emphysema. There is right-sided retrobulbar soft tissue and infiltration. There is comminuted fracture along the RIGHT medial orbital wall/lamina papyracea with herniation of fat through the osseous defect. Additionally, note is made of a small hematoma within the extraconal space adjacent to the medial rectus musculature. There is no evidence of muscular entrapment. The left orbit is intact. There is marked thickening of the ventral and posterolateral wall of the right maxillary with opacification of the visualized right maxillary sinus. The zygomatic arches, pterygoid plates and hard palate are intact. The nasal bones and bridge are intact. The nasal septum is midline. Both mandibular condyles are well articulated at the  temporomandibular joints. The middle ear cavities and mastoid air cells are clear. IMPRESSION: 1.Right-sided facial soft tissue injury. 2.Comminuted fracture along the RIGHT medial orbital wall/lamina papyracea with herniation of fat through the osseous defect. There is right-sided orbital emphysema and right-sided retrobulbar are soft tissue stranding. Additionally, note is made of a small hematoma within the extraconal space adjacent to the medial rectus musculature. There is no evidence of muscular entrapment. 3. These urgent findings were relayed via secure EPIC message with and acknowledged by [facility] at 8:44 PM on 10/14/2023.  Butler Blanch, MD 10/17/2023 9:13 AM    Result Date: 10/17/2023   1.Right-sided facial soft tissue injury. 2.Comminuted fracture along the left medial orbital wall/lamina papyracea with herniation of fat through the osseous defect. There is right-sided  orbital emphysema and right-sided retrobulbar are soft tissue stranding. Additionally, note is made of a small hematoma within the left extraconal space adjacent to the medial rectus musculature. There is no evidence of muscular entrapment. 3. These urgent findings were relayed via secure EPIC message with and acknowledged by [facility] at 8:44 PM on 10/14/2023.  Butler Blanch, MD 10/14/2023 8:45 PM    XR Chest AP Portable  Result Date: 10/15/2023  No acute abnormal findings. Deward Holt, MD 10/15/2023 3:14 PM    CT Hip Right WO Contrast  Result Date: 10/14/2023  1.No evidence of acute bony injury of the right hip. Consider further corroboration with MR imaging to rule out occult fracture if patient has difficulty weightbearing. 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 Franky CHRISTELLA Louder, MD 10/14/2023 8:49 PM    CT Head without Contrast  Result Date: 10/14/2023   1.Right-sided facial soft tissue injury. Please see separate facial bone CT report. 2.No CT evidence of acute intracranial hemorrhage,  herniation or hydrocephalus. 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 Butler Blanch, MD 10/14/2023 8:34 PM    CT Cervical Spine without Contrast  Result Date: 10/14/2023  1. No cervical spine fracture is detected. 2.  Multilevel cervical spondylosis.  Butler Blanch, MD 10/14/2023 8:31 PM      Social History: (Per pt)  Lives at [facility].  Entry Steps: 0    Inside steps: 0    Equipment at home:  rollator, w/c, grab bars in shower, built in shower bench, hand held shower head, grab bar around toilet area, and 3 prong cane (?hurrycane)  Prior Level of Function:  at least one fall per month estimating ~6+ falls in the last 6 months   Cognition: WFL    Mobility/Locomotion: modI with rollator. Has a w/c from previous hospitalization; doesn't use but reports self-propulsion is challenging for her   Feeding: independent - eats two meals/day in dining hall   Grooming: independent    Bathing: occasional assist with showering    Dressing: independent    Toileting: independent     Subjective   Too many words; I just know it hurts (regarding new onset L anterior hip/groin/pelvic pain) initially described as cramping.    Patient is agreeable to participation in the therapy session. Family and/or guardian are agreeable to patient's participation in the therapy session. Nursing clears patient for therapy.  Patient's Goal:  for this pain (L anterior hip/groin/pelvic pain) to go away  Pain: Pt unable to quantify; pt gasps at times   Location: L anterior hip/pelvis/groin pain  Therapist Intervention: positioned for comfort and hot pack (~6 layers between skin and hot pack) applied. Attending and RN made aware. Pt denying any need/makes no request for pain medication  Patient is satisfied with therapist intervention.    Objective     Precautions/ Contraindications:   Precautions  Weight Bearing Status: no restrictions  Other Precautions: Fall, orthostatic BP - ted hose (AAT except for skin  checks) and abdominal binder when sitting up/OOB mobility(    Patient is in bed with PrimaFit (external female catheter) and intravenous access in place. Pt daughter, Vanessa Kelly, at bedside.       Observation of patient/vitals:        10/17/23 1121   Vital Signs   Heart Rate 72   BP 179/65   BP Location Left arm   BP Method Automatic   MAP (mmHg) 103   Patient Position  Lying  (HOB flat; knee high ted hose in place)        10/17/23 1124 10/17/23 1133 10/17/23 1142   Vital Signs   Heart Rate 72 84 68   BP 122/67 93/58 190/77   BP Location Left arm Left arm Left arm   BP Method Automatic Automatic Automatic   MAP (mmHg) 86 70 (!) 115   Patient Position Sitting  (at EOB unsupported; knee high ted hose in place) Standing  (with abdominal binder and ted hose in place; standing ~2-3 minutes prior to BP assessed) Sitting  (s/p seated BLE exercises completed. Abdominal binder and ted hose in place)        10/17/23 1154   Vital Signs   Heart Rate 87   BP 171/62   BP Location Left arm   BP Method Automatic   MAP (mmHg) 98   Patient Position Sitting  (s/p amb 20 ft, toilet transfer (did NOT void or have BM), amb 5 ft) Abdominal binder and knee high ted hose in place.        Orientation/Cognition:  Alert and Oriented x 3  Cognition: Able to follow multi-step commands with greater than 75% accuracy      Musculoskeletal Examination:      ROM Strength   RLE WFL At least 3/5 as observed with functional mobility   LLE WFL (active hip flexion and active knee extension increase L anterior pelvic pain)  At least 3/5 as observed with functional mobility     Sensation: Pt denies any numbness and tingling  Coordination: WFL    Functional Mobility:  *increased time with all mobility  Rolling: SBA    Supine to sit: SBA   Scooting: SBA  Sit to Supine: SBA  Sit to stand: SBA from bed/first rep, CGA from chair; often places hands on lowest rungs before moving to top of RW  Stand to sit: CGA  Ambulation:     Weightbearing: no  restrictions   Assistance level: SBA first rep, CGA second rep   Distance: 20 ft x 2   Assistive Device: RW   Gait Deviations: decreased cadence, forward flexed posture, intermittently places hands on lower rungs of walker    Balance:  Static Sit: Good  Dynamic Sit: Fair   Static Stand: fair   Dynamic Stand: Fair    Therapeutic exercises:  Heel/toe raises: x15 each  Long Arc Quad: x15 w/ cues for ankle DF  Seated Marches: x30 total (15 each extremity)      Participation:  Good; limited by L anterior hip/pelvic pain    Education:  Educated the patient to role of physical therapy, plan of care, goals  of therapy, rationale for progressing mobility and safety with mobility and ADLs.    RN notified of session outcome and that patient was left in bed (as requested) comfortably positioned with all needs met and equipment intact.   Safety measures include: handoff to nurse/clin tech/ unit secretary completed, oriented to call bell and placed within reach, personal items within reach, assistive device positioned out of reach, bed placed in lowest position, and family/caregiver at bedside.   Mobility and ADL status posted at bedside and within E.M.R.    AM-PACT Inpatient Short Forms  Inpatient AM-PACT Performed? (PT): Basic Mobility Inpatient Short Form   AM-PACT 6 Clicks Basic Mobility Inpatient Short Form  Turning Over in Bed: A little  Sitting Down On/Standing From Armchair: A little  Lying on Back to Sitting on Side of Bed:  A little  Assist Moving to/from Bed to Chair: A little  Assist to Walk in Hospital Room: A little  Assist to Climb 3-5 Steps with Railing: A lot  PT Basic Mobility Raw Score: 17  CMS 0-100% Score: 50.57%      Goals  Goal Formulation: With patient/family  Time for Goal Acheivement: 7 visits  Goals: Select goal  Pt Will Roll Left: modified independent  Pt Will Roll Right: modified independent  Pt Will Go Supine To Sit: modified independent  Pt Will Perform Sit To Supine: modified independent  Pt Will  Perform Sit to Stand: with supervision (and RW)  Pt Will Transfer Bed/Chair: with supervision (and RW)  Pt Will Ambulate: 51-100 feet;with rolling walker;with supervision                Therapist PPE during session procedural mask and gloves     Signature:  Anastasia Sofia, PT, DPT  10/17/2023  1:52 PM       (For scheduling questions, please contact rehab tech x [phone])             [1]   Patient Active Problem List  Diagnosis    Headache    Mastoiditis    Dizziness    Tremor    Adrenal cortical hypofunction    Anemia    Atherosclerotic heart disease of native coronary artery without angina pectoris    DM2 (diabetes mellitus, type 2) (CMS/HCC)    Essential hypertension    Fall    Gastroesophageal reflux disease without esophagitis    Carotid artery disease    Malignant tumor of breast (CMS/HCC)    Mixed hyperlipidemia    Parkinson's disease (CMS/HCC)    Peripheral vascular disorder due to diabetes mellitus (CMS/HCC)    Stage 3 chronic kidney disease (CMS/HCC)    Unsteadiness on feet    Injury of head, initial encounter   [2]   Past Medical History:  Diagnosis Date    Diabetes mellitus (CMS/HCC)     Gastroesophageal reflux disease     Hyperlipidemia     Hypertension     Hypotension     Malignant tumor of breast (CMS/HCC) 04/13/2016    Seasonal allergic rhinitis     Syncope    [3]   Past Surgical History:  Procedure Laterality Date    BREAST BIOPSY Right     2014    CORONARY ARTERY BYPASS      HYSTERECTOMY      [phone]

## 2023-10-17 NOTE — Plan of Care (Signed)
 Trio rounding template   10/17/2023    - Brief  background -Vanessa Kelly is a 83 y.o. female with injury of head  (why Pt came to hospital, and why they are still here )   -Last 24 Hrs (any critical events ,abnormal VS, treatment, trend ): none  -Respiratory (changes in oxygenation): none  -Cardiac Rhythm - if changes-  none  - Weight trend if applicable - none  -GI/GU (changes in out put, BM, Foley): none  -Pain:tylenol   -Nutrition (diabetic, NPO): heart healthy and renal  -Activity / Safety/ mobility  questions (PMP, need for PT/ OT , sitter) : none  -Upcoming Procedures :  none  -Discharge Date/ Plans:  TBD/ return to [facility] [place]  -Questions/ orders needed  none    Note : Night shift to complete prior to Trio rounding   Joellen Cave, RN

## 2023-10-17 NOTE — Nursing Progress Note (Signed)
 Patient is alert and oriented times 4. Complained of pain and received PRN pain meds with good effect. Received and tolerated scheduled meds. Patient can dangle at bedside. Orthostatic BP and wound care completed. Purposeful rounding was performed. Bed is in the lowest position and call light is within reach.     Geriatric Focused Assessment    Is the patient age 85 or older? Yes     Sleep:   Does the patient have an ineffective sleeping pattern? No    Problems with feeding:    Does the patient have an adequate appetite? Yes   Is the patient able to feed themself? Yes    Incontinence:   Is the patient incontient of bowel or bladder? Yes    Confusion:   Does the patient have any signs or symptoms of deliruim? No    Evidence of falls:   Is the patient a documented high fall risk? Yes    Skin Breakdown:   Does the patient have any areas of skin breakdown? No   Are there any risk factors? No

## 2023-10-17 NOTE — OT Eval Note (Addendum)
 Occupational Therapy Evaluation  Vanessa Kelly        Post Acute Care Therapy Recommendations:     Discharge Recommendations:  SNF    If SNF  recommended discharge disposition is not available, patient will need mod/max assist for ADLs and home with supervision, HHA, HHOT.     DME needs IF patient is discharging home: No additional equipment/DME recommended at this time, Patient already has needed equipment    Therapy discharge recommendations may change with patient status.  Please refer to most recent note for up-to-date recommendations.         [facility]  [address]  [place], [place] [address]  [phone]    Occupational Therapy Evaluation    Patient: Vanessa Kelly MRN: [id number]   Unit: 3B ONCOLOGY GENERAL MEDICINE Bed: M330/M330-02    Time of treatment:   OT Received On: 10/17/23  Start Time: 1116  Stop Time: 1212  Time Calculation (min): 56 min  Total Treatment Time (min): 28    Consult received for Vanessa Kelly for OT evaluation and treatment.  Patient's medical condition is appropriate for Occupational Therapy  intervention at this time.    Interpreter utilized: no, not indicated    Assessment     Vanessa Kelly is a 83 y.o. female admitted 10/14/2023 after falling at ALF with head trauma. Pt found to have a right orbital wall fracture, right facial soft tissue injury, and right supraorbital facial laceration. Pt has a PMH significant for syncope, orthostatic hypotension, CAD s/p CABG, HTN, HLD, DM2, anemia, and GERD.     Pt in bed upon arrival of OT and PT; daughter present at bedside. Co-eval performed due to the pt's refusal to participate in OT the previous day and concerns regarding the pt's ability to tolerate two separate therapy sessions. Each therapist addressed discipline-specific goals. Pt required CGA for bed mobility and demonstrated fair sitting balance EOB. Occasionally demo posterior lean due to pain; L pelvis/groin pain? She requires max assist for UBD;  donning abdominal binder. Pt stands with RW requiring min assist and requires verbal cues for hand placement. Functional mobility assessed within the context of ADLs. Pt required min assist for toilet transfers and safe task performance. Based on the patient's current functional status, continued skilled OT services are recommended to address ADL performance, safety with mobility, energy conservation strategies, and increased independence. At this time, recommend D/C to SNF.    Therapeutic Intervention (10 Minutes): Additional time required to monitor vital signs throughout session. Pt remained asymptomatic though BP drops significantly.      10/17/23 1121 10/17/23 1124 10/17/23 1133   Vital Signs   Heart Rate 72 72 84   BP 179/65 122/67 93/58   BP Location Left arm Left arm Left arm   BP Method Automatic Automatic Automatic   MAP (mmHg) 103 86 70   Patient Position Lying  (HOB flat; knee high ted hose in place) Sitting  (at EOB unsupported; knee high ted hose in place) Standing  (with abdominal binder and ted hose in place; standing ~2-3 minutes prior to BP assessed)      10/17/23 1142 10/17/23 1154   Vital Signs   Heart Rate 68 87   BP 190/77 171/62   BP Location Left arm Left arm   BP Method Automatic Automatic   MAP (mmHg) (!) 115 98   Patient Position Sitting  (s/p seated BLE exercises completed. Abdominal binder and ted hose in place) Sitting  (  s/p amb 20 ft, toilet transfer (did NOT void or have BM), amb 5 ft; abdominal binder and knee high ted hose in place)     Pt's ability to complete ADLs and functional transfers is impaired due to the following deficits: decreased activity tolerance, decreased balance, decreased bed mobility, decreased coordination, gait impairment, pain, decreased strength, transfers , and abnormal vital signs.  Pt demonstrates performance deficits with grooming, dressing, toileting, and functional mobility. There are a few comorbidities or other factors that affect plan of care and  require modification of task including: assistive device needed for mobility, frequent falls, home alone for a portion of the day, and lives alone. Standardized tests and exams incorporated into evaluation include AM-PAC Daily Activity, balance, coordination, vision, mobility, ROM/strength, and cognition/orientation. Pt would continue to benefit from OT to address these deficits and increase functional independence.        Complexity Chart Review Performance Deficits Clinical Decision Making Hx/Comorbidities Assistance needed   Moderate Expanded 3-5 Several Options 1-2 Min/Mod assist (not at baseline)     PMP - Progressive Mobility Protocol   PMP Activity: Step 6 - Walks in Room  Distance Walked (ft) (Step 6,7): 20 Feet (x2 reps)     Rehabilitation Potential: good for goals     Interdisciplinary Communication: discussed with RN, PT, MD and CM    Plan     OT Plan  Risks/Benefits/POC Discussed with Pt/Family: With patient  Treatment Interventions: ADL retraining;Functional transfer training;UE strengthening/ROM;Endurance training;Patient/Family training;Equipment eval/education;Fine motor coordination activities;Compensatory technique education  Discharge Recommendation: SNF  DME Recommended for Discharge: No additional equipment/DME recommended at this time;Patient already has needed equipment  OT Frequency Recommended: 2-3x/wk         Medical Diagnosis: Facial laceration, initial encounter [S01.81XA]  Injury of head, initial encounter [S09.90XA]  Closed fracture of orbit, initial encounter (CMS/HCC) [S02.85XA]  Hypertension, unspecified type [I10]    History of Present Illness: Vanessa Kelly is a 83 y.o. female admitted on  10/14/2023 with PMH of syncope, orthostatic hypotension, CAD s/p CABG, HTN, HLD, DM2, anemia, GERD presents after fall vs syncope with head trauma at nursing home. BP elevated, other vitals stable. Labs significant for no leukocytosis, Hb 9.1, Cr 1.5, Na 133, UA negative for infection. CT  facial bones Comminuted fracture along the left medial orbital wall/lamina papyracea with herniation of fat through the osseous defect. There is right-sided orbital emphysema and right-sided retrobulbar are soft tissue stranding. Additionally, note is made of a small hematoma within the left extraconal space adjacent to the medial rectus musculature. There is no evidence of muscular entrapment. Admitted for ambulatory dysfunction 2/2 fall with head trauma.       Problem List[1]  Medical History[2]  Past Surgical History[3]    Tests/Labs:  Lab Results   Component Value Date/Time    HGB 9.5 (L) 10/15/2023 06:06 AM    HGB 10.4 (L) 06/28/2022 06:18 AM    HCT 28.5 (L) 10/15/2023 06:06 AM    HCT 31.8 (L) 06/28/2022 06:18 AM    K 5.3 10/15/2023 06:06 AM    K 4.5 06/28/2022 06:18 AM    NA 136 10/15/2023 06:06 AM    NA 140 06/28/2022 06:18 AM    INR 1.2 07/15/2023 01:04 PM    TROPI 6.8 08/18/2023 03:20 AM    TROPI 6.4 08/17/2023 09:41 PM    TROPI 13.9 07/15/2023 04:17 PM    TROPI 18.6 (H) 07/15/2023 01:04 PM    TROPI 6.7 06/23/2022 06:41 PM  TROPI <2.7 01/18/2022 05:15 PM    TROPI <0.01 01/21/2021 02:30 PM         Imaging:  CT Sinus Facial Bones without Contrast  Addendum Date: 10/17/2023  ADDENDUM: This addendum is to clarify a typographical error in the initial dictation.  The error occurs in the [facility] IMPRESSIONsection of the report. The following text appeared:  FINDINGS: There is a right frontal and right periorbital soft tissue swelling and hematoma extending over the right lateral orbital rim. There is orbital emphysema. There is right-sided retrobulbar soft tissue and infiltration. There is comminuted fracture along the left medial orbital wall/lamina papyracea with herniation of fat through the osseous defect. Additionally, note is made of a small hematoma within the left extraconal space adjacent to the medial rectus musculature. There is no evidence of muscular entrapment. The left orbit is intact. There  is marked thickening of the ventral and posterolateral wall of the right maxillary with opacification of the visualized right maxillary sinus. The zygomatic arches, pterygoid plates and hard palate are intact. The nasal bones and bridge are intact. The nasal septum is midline. Both mandibular condyles are well articulated at the temporomandibular joints. The middle ear cavities and mastoid air cells are clear. IMPRESSION: 1.Right-sided facial soft tissue injury. 2.Comminuted fracture along the left medial orbital wall/lamina papyracea with herniation of fat through the osseous defect. There is right-sided orbital emphysema and right-sided retrobulbar are soft tissue stranding. Additionally, note is made of a small hematoma within the left extraconal space adjacent to the medial rectus musculature. There is no evidence of muscular entrapment. 3. These urgent findings were relayed via secure EPIC message with and acknowledged by [facility] at 8:44 PM on 10/14/2023.  The corrected text should read:   FINDINGS: There is a right frontal and right periorbital soft tissue swelling and hematoma extending over the right lateral orbital rim. There is orbital emphysema. There is right-sided retrobulbar soft tissue and infiltration. There is comminuted fracture along the RIGHT medial orbital wall/lamina papyracea with herniation of fat through the osseous defect. Additionally, note is made of a small hematoma within the extraconal space adjacent to the medial rectus musculature. There is no evidence of muscular entrapment. The left orbit is intact. There is marked thickening of the ventral and posterolateral wall of the right maxillary with opacification of the visualized right maxillary sinus. The zygomatic arches, pterygoid plates and hard palate are intact. The nasal bones and bridge are intact. The nasal septum is midline. Both mandibular condyles are well articulated at the temporomandibular joints. The middle ear cavities  and mastoid air cells are clear. IMPRESSION: 1.Right-sided facial soft tissue injury. 2.Comminuted fracture along the RIGHT medial orbital wall/lamina papyracea with herniation of fat through the osseous defect. There is right-sided orbital emphysema and right-sided retrobulbar are soft tissue stranding. Additionally, note is made of a small hematoma within the extraconal space adjacent to the medial rectus musculature. There is no evidence of muscular entrapment. 3. These urgent findings were relayed via secure EPIC message with and acknowledged by [facility] at 8:44 PM on 10/14/2023.  Butler Blanch, MD 10/17/2023 9:13 AM    Result Date: 10/17/2023   1.Right-sided facial soft tissue injury. 2.Comminuted fracture along the left medial orbital wall/lamina papyracea with herniation of fat through the osseous defect. There is right-sided orbital emphysema and right-sided retrobulbar are soft tissue stranding. Additionally, note is made of a small hematoma within the left extraconal space adjacent to the medial rectus musculature. There is no evidence of  muscular entrapment. 3. These urgent findings were relayed via secure EPIC message with and acknowledged by [facility] at 8:44 PM on 10/14/2023.  Butler Blanch, MD 10/14/2023 8:45 PM    XR Chest AP Portable  Result Date: 10/15/2023  No acute abnormal findings. Deward Holt, MD 10/15/2023 3:14 PM    CT Hip Right WO Contrast  Result Date: 10/14/2023  1.No evidence of acute bony injury of the right hip. Consider further corroboration with MR imaging to rule out occult fracture if patient has difficulty weightbearing. 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 Franky CHRISTELLA Louder, MD 10/14/2023 8:49 PM    CT Head without Contrast  Result Date: 10/14/2023   1.Right-sided facial soft tissue injury. Please see separate facial bone CT report. 2.No CT evidence of acute intracranial hemorrhage, herniation or hydrocephalus. 3.Cerebral volume loss and  sequela of chronic ischemic disease. Butler Blanch, MD 10/14/2023 8:34 PM    CT Cervical Spine without Contrast  Result Date: 10/14/2023  1. No cervical spine fracture is detected. 2.  Multilevel cervical spondylosis.  Butler Blanch, MD 10/14/2023 8:31 PM       Social History: (Per family/care provider, if patient is not able to give accurate report)  Lives at an ALF [facility]).  Entry Steps: none   Inside steps: elevator access    Equipment at home:  single point cane, rollator, w/c, walk in shower, grab bars in shower, built in shower bench, hand held shower head, and grab bar around toilet area  Prior Level of Function:      Cognition: WFL    Mobility: mod indep with rollator    Feeding: indep   Grooming: indep   Bathing: occasional assist from staff   Dressing: indep   Toileting: mod indep grab bars around toilet    Subjective   Patient is agreeable to participation in the therapy session. Family and/or guardian are agreeable to patient's participation in the therapy session. Nursing clears patient for therapy.  Patient's Goal:  none stated   Pain: Pt unable to quantify  Location: L groin/pelvis  Therapist Intervention: positioned for comfort, premedicated for pain by RN, and hot pack applied  Patient is satisfied with therapist intervention.    Objective     Precautions:   Precautions  Weight Bearing Status: no restrictions  Other Precautions: Fall, orthostatic BP - ted hose (AAT except for skin checks) and abdominal binder when sitting up/OOB mobility(    Patient is in bed with intravenous access and [facility] in place.       Observation of patient/vitals:   Vitals:    10/17/23 1133 10/17/23 1142 10/17/23 1154 10/17/23 1230   BP: 93/58 190/77 171/62 177/71   Pulse: 84 68 87 68   Resp:    14   Temp:    97.2 F (36.2 C)   TempSrc:    Oral   SpO2:    100%   Weight:       Height:           Orientation/Cognition:     Alert and Oriented x 3  Cognition: follows all commands; unable to describe symptoms??        Musculoskeletal Examination:     ROM Strength   Neck/ Trunk WFL WFL   RUE WFL WFL   LUE WFL WFL         Sensation: Intact to light touch, denies numbness/tingling throughout BUE   Coordination: Intact gross motor and  serial opposition to B hands    Vision: WFL  Hearing: WFL      Functional Mobility:  Supine to sit: CGA  Scooting: CGA  Sit to Supine: CGA  Sit to stand: min assist using RW  Stand to sit: min assist using RW  Transfers: on/off bed, toilet, chair   Mobility/Ambulation: 20 x 2     Balance:  Static Sit Balance: good  Dynamic Sit Balance: good  Static Stand Balance: fair  Dynamic Stand Balance: fair    Self Care:  UB Dressing: max assist; don abdominal binder  Toileting: min assist; toilet transfer     Endurance: decreased activity tolerance     Participation:  good     Education:  Educated the patient/family/caregiver to role of occupational therapy, plan of care, goals  of therapy, rationale for progressing mobility and safety with mobility and ADLs and home safety.    RN notified of session outcome and that patient was left in bed with all needs met and equipment intact.   Safety measures include: handoff to nurse/clin tech/ unit secretary completed, bed alarm activated, oriented to call bell and placed within reach, personal items within reach, assistive device positioned out of reach, bed placed in lowest position, and family/caregiver at bedside.   Mobility and ADL status posted at bedside and within E.M.R.        AM-PACT 6 Clicks Daily Activity Inpatient Short Form  Inpatient AM-PACT Performed?: yes  Put On/Take Off Lower Body Clothing: 2  Assist with Bathing: 2  Assist with Toileting: 2  Put On/Take Off Upper Body Clothing: 2  Assist with Grooming: 3  Assist with Eating: 3  OT Daily Activity Raw Score: 14  CMS 0-100% Score: 59.67%       Goals:  Goals  Goal Formulation: Patient  Time For Goal Achievement: by time of discharge  Goals: Select goal  Patient will groom self: Supervision  Patient  will dress upper body: Supervision  Patient will dress lower body: Supervision  Patient will toilet: Supervision  Pt will perform functional transfers: Supervision;with rolling walker      Therapist PPE during session procedural mask and gloves      Signature:   Elenor Jensen, OT  10/17/2023  1:47 PM             [1]   Patient Active Problem List  Diagnosis    Headache    Mastoiditis    Dizziness    Tremor    Adrenal cortical hypofunction    Anemia    Atherosclerotic heart disease of native coronary artery without angina pectoris    DM2 (diabetes mellitus, type 2) (CMS/HCC)    Essential hypertension    Fall    Gastroesophageal reflux disease without esophagitis    Carotid artery disease    Malignant tumor of breast (CMS/HCC)    Mixed hyperlipidemia    Parkinson's disease (CMS/HCC)    Peripheral vascular disorder due to diabetes mellitus (CMS/HCC)    Stage 3 chronic kidney disease (CMS/HCC)    Unsteadiness on feet    Injury of head, initial encounter   [2]   Past Medical History:  Diagnosis Date    Diabetes mellitus (CMS/HCC)     Gastroesophageal reflux disease     Hyperlipidemia     Hypertension     Hypotension     Malignant tumor of breast (CMS/HCC) 04/13/2016    Seasonal allergic rhinitis     Syncope    [3]   Past  Surgical History:  Procedure Laterality Date    BREAST BIOPSY Right     2014    CORONARY ARTERY BYPASS      HYSTERECTOMY      [phone]

## 2023-10-17 NOTE — Progress Notes (Signed)
 CM Update:  CM met patient at her bedside to discuss discharge, SNF referrals and choice. Patient reported that she has been to [facility] previously and would all other referrals sent expect that facility. CM placed referral and is awaiting response.  Montie Due, MSW   Case Management  [facility]  [phone]

## 2023-10-18 ENCOUNTER — Inpatient Hospital Stay

## 2023-10-18 DIAGNOSIS — D631 Anemia in chronic kidney disease: Secondary | ICD-10-CM

## 2023-10-18 DIAGNOSIS — N1831 Chronic kidney disease, stage 3a: Secondary | ICD-10-CM

## 2023-10-18 DIAGNOSIS — S0990XA Unspecified injury of head, initial encounter: Secondary | ICD-10-CM

## 2023-10-18 DIAGNOSIS — R42 Dizziness and giddiness: Secondary | ICD-10-CM

## 2023-10-18 DIAGNOSIS — E119 Type 2 diabetes mellitus without complications: Secondary | ICD-10-CM

## 2023-10-18 LAB — WHOLE BLOOD GLUCOSE POCT
Whole Blood Glucose POCT: 127 mg/dL — ABNORMAL HIGH (ref 70–100)
Whole Blood Glucose POCT: 225 mg/dL — ABNORMAL HIGH (ref 70–100)
Whole Blood Glucose POCT: 125 mg/dL — ABNORMAL HIGH (ref 70–100)
Whole Blood Glucose POCT: 125 mg/dL — ABNORMAL HIGH (ref 70–100)

## 2023-10-18 LAB — BASIC METABOLIC PANEL
Anion Gap: 7 (ref 5.0–15.0)
BUN: 19 mg/dL (ref 7–21)
CO2: 21 meq/L (ref 17–29)
Calcium: 10 mg/dL (ref 7.9–10.2)
Chloride: 110 meq/L (ref 99–111)
Creatinine: 1.2 mg/dL — ABNORMAL HIGH (ref 0.4–1.0)
GFR: 43.8 mL/min/1.73 m2 — ABNORMAL LOW (ref >=60.0)
Glucose: 235 mg/dL — ABNORMAL HIGH (ref 70–100)
Potassium: 4.9 meq/L (ref 3.5–5.3)
Sodium: 138 meq/L (ref 135–145)

## 2023-10-18 LAB — CBC
Absolute nRBC: 0 x10 3/uL (ref <=0.00)
Hematocrit: 29 % — ABNORMAL LOW (ref 34.7–43.7)
Hemoglobin: 9.5 g/dL — ABNORMAL LOW (ref 11.4–14.8)
MCH: 33.7 pg — ABNORMAL HIGH (ref 25.1–33.5)
MCHC: 32.8 g/dL (ref 31.5–35.8)
MCV: 102.8 fL — ABNORMAL HIGH (ref 78.0–96.0)
MPV: 10.4 fL (ref 8.9–12.5)
Platelet Count: 155 x10 3/uL (ref 142–346)
RBC: 2.82 x10 6/uL — ABNORMAL LOW (ref 3.90–5.10)
RDW: 13 % (ref 11–15)
WBC: 5.49 x10 3/uL (ref 3.10–9.50)
nRBC %: 0 /100{WBCs} (ref <=0.0)

## 2023-10-18 MED ORDER — MECLIZINE HCL 12.5 MG PO TABS
25.0000 mg | ORAL_TABLET | Freq: Three times a day (TID) | ORAL | Status: DC | PRN
Start: 2023-10-18 — End: 2023-10-19

## 2023-10-18 MED ORDER — BISACODYL 5 MG PO TBEC
5.0000 mg | DELAYED_RELEASE_TABLET | Freq: Every day | ORAL | Status: AC
Start: 2023-10-18 — End: 2023-10-19
  Administered 2023-10-18 – 2023-10-19 (×2): 5 mg via ORAL
  Filled 2023-10-18 (×2): qty 1

## 2023-10-18 NOTE — PT Progress Note (Signed)
 [facility]  Physical Therapy Attempt Note    Patient:  MAKAIA RAPPA MRN#:  [id number]    Unit:  3B ONCOLOGY GENERAL MEDICINE Room/Bed:  M330/M330-02    PT Cancellation: Visit  PT Visit Cancellation Reason: Testing/Procedure. Pt off unit for repeat head CT to r/o subdural hematoma as pt with new symptoms including headache and vertigo at rest per today 's attending note. Per RN, pt had one episode of vertigo when supine at rest overnight/earlier this AM. PT will follow up with pt after results of repeat head CT as appropriate and as schedule permits.             Signature:   Anastasia Sofia, PT, DPT  10/18/2023  1:20 PM       (For scheduling questions, please contact rehab tech x [phone])

## 2023-10-18 NOTE — Progress Notes (Addendum)
 UPDATE: The patient daughter, provided choice at [facility] and rehab. This patient has has been accepted to [facility] and rehab. Pending (when medically stable). CM, provided an update to the patient daughter Bernice. Per Bernice, her mother has a PCP at her facility that she sees regularly.    CM, spoke to the patient daughter Bernice [facility]), regarding PT/OT is recommending SNF/Rehab at [place]. Per the patient daughter she would like to see if her mother can go to [place] vs SNF at [place].   Per the patient daughter her mother was previously [place] to a SNF and the facility was unkept and she feels the therapy was not beneficial.     CM. Spoke  Nicolette, I have spoken, the patient daughter again to inform her of PT/OT recommendations. CM, notified the patient caregiver that Per, PT that the patient will not meet criteria or [place]. CM, was able to provide and updated SNF list to the patient caregiver. And she wants to tour the facility of choice today  and she will contact this Clinical research associate regarding facility of choice.     DCP:SNF/Rehab at [place], [employer] transport

## 2023-10-18 NOTE — Progress Notes (Signed)
 [facility] Note:    Referral received. Patient has been identified as a potential candidate for acute rehab.     Unable to accept patient: (P) Cannot tolerate an intensive 3hr/day rehab program, Agree with SNF recommendation, No Rehab Diagnosis    The patient would benefit from a longer period of recovery, in a less intensive environment.  Agree with therapy recommendations.  Management reviewed.      CM/SW notified of decision via [facility].    Rehab Admissions Liaison  Meshilem Machuca, BSN, RN  [phone]

## 2023-10-18 NOTE — Progress Notes (Addendum)
 [facility]  Internal Medicine Hospitalists  Progress Note        Assessment / Plan:        Vanessa Kelly is a 83 y.o. female with PMH of syncope, orthostatic hypotension, CAD s/p CABG, HTN, HLD, DM2, anemia, GERD presents after fall vs syncope with head trauma at nursing home.  Found to have right orbital wall fracture    Remains hospitalized for severe orthostatic hypotension resulting in recent falls at home    #mechanical fall vs syncope  #history of syncope d/t orthostatic hypotension  #Multiple falls at home  #right hip pain  #Supine hypertension   CTH- negative  CT cervical- negative  CT right hip -negative for acute pathology   -TTE shows LVEF 69% and no significant valvulopathy  -fall precautions  -PT/OT eval --> SNF  -6/16: orthostatics positive.  Patient reports using compression stockings and midodrine  as needed at home.  Will give 500 cc IV fluid, compression stocking, abdominal binder, and reassess orthostatic vital signs.  Adjust midodrine  as needed    Patient started to have new symptom including some headache and vertigo at rest, will get head CT to rule out subdural hematoma    -6/17: orthostatics remain positive but patient refused morning midodrine . Pretty significant supine hypertension noted, limiting treatment. Will continue with midodrine  in the morning and afternoon -- hold night dose to minimize supine hypertension.  Patient should sleep at 30 to 45 degrees to minimize supine hypertension. Patient should keep bedside commode after discharge to avoid walking long distances at nighttime when she is not on midodrine .  Patient should avoid lying down during the day.    Cardiology consulted   Echo :Left ventricular systolic function is normal with an ejection fraction 69%, Mild AR and TR     #lower abdominal pain   - Patient reports lower abdominal pain with movement and on palpation which started right after she worked with physical therapy.  She has not had a bowel  movement in several days.  No nausea or vomiting.  No fevers or chills.  No urinary symptoms.  -  Xray  Air and stool throughout the colon with mild colonic ileus , bisacodyl  X 2 days     Muscle strain is in the differential    #right orbital fracture  #epistaxis, resolved  #supraorbital laceration   CT facial bones- Right-sided facial soft tissue injury.  2.Comminuted fracture along the left medial orbital wall/lamina papyracea with herniation of fat through the osseous defect. There is right-sided orbital emphysema and right-sided retrobulbar are soft tissue stranding. Additionally, note is made of a small hematoma within the left extraconal space adjacent to the medial rectus musculature. There is no evidence of muscular entrapment.  -ophthalmology consulted, recs outpatient follow up. Visual acuity has been stable -- patient reports improving visual acuity -- sometimes blurry vision related to clearing which improves with blinking. Swelling improving       #CKD3  Renally dose medications and avoid nephrotoxins     #CAD, s/p CABG  #HLD  -continue aspirin , statin, ranolazine      #GERD  -continue PPI     #anemia, chronic  Hb 9.5, at baseline     #DM2  No meds currently.  -Blood sugar 235 today  , Hemoglobin A1c is checked twice this year 5.8 and 5.7  - Will avoid metformin due to kidney failure-most recent creatinine is 1.2  -Will continue to monitor blood sugar     #hx of right  breast cancer     VTE Prophylaxis: enoxaparin  (LOVENOX ) syringe 40 mg         Dw Rn and daughter at bedside  Dw CM and PT    Foley Catheter: No Foley Present    Venous Access: No Temporary Central Line Present    Medical Readiness for Discharge: Anticipated in 2-4 Days    Open Handoff Activity in Sidebar    Additional Diagnoses:               Subjective:      Ongoing orthostatic hypotension, not very symptomatic today  however    Lower abdominal pain resolved    Vision is better than yesterday, no pain in the eye unless turns head to the  right     Patient noted to have headache and vertigo at rest last night, this morning she does not have headache but is still have vertigo at rest when she moves       Objective:      Temp:  [97.2 F (36.2 C)-98.2 F (36.8 C)] 97.3 F (36.3 C)  Heart Rate:  [64-87] 72  Resp Rate:  [14-17] 16  BP: (92-213)/(39-80) 152/70   General: WD female in no acute distress.  HEENT: R periorbital ecchymoses, R conjunctiva red diffusely improving, EOMI  Neck: no JVD noted  Lungs: normal WOB  Abdomen: Soft, lower abdomen tenderness - no peritoneal signs   Extremities: WWP, trace edema  Neuro: AAOx3, no gross focal deficits, visual acuity grossly normal   Skin: No rashes noted on exposed skin

## 2023-10-18 NOTE — Plan of Care (Signed)
 Problem: Pain interferes with ability to perform ADL  Goal: Pain at adequate level as identified by patient  Outcome: Progressing  Flowsheets (Taken 10/18/2023 1217)  Pain at adequate level as identified by patient:   Identify patient comfort function goal   Assess for risk of opioid induced respiratory depression, including snoring/sleep apnea. Alert healthcare team of risk factors identified.   Assess pain on admission, during daily assessment and/or before any as needed intervention(s)   Reassess pain within 30-60 minutes of any procedure/intervention, per Pain Assessment, Intervention, Reassessment (AIR) Cycle   Evaluate if patient comfort function goal is met   Evaluate patient's satisfaction with pain management progress   Offer non-pharmacological pain management interventions   Consult/collaborate with Physical Therapy, Occupational Therapy, and/or Speech Therapy     Problem: Moderate/High Fall Risk Score >5  Goal: Patient will remain free of falls  Outcome: Progressing  Flowsheets (Taken 10/18/2023 0900)  High (Greater than 13):   HIGH-Apply yellow Fall Risk arm band   HIGH-Consider use of low bed   MOD-Remain with patient during toileting   MOD-Use of assistive devices -Bedside Commode if appropriate   MOD-Perform dangle, stand, walk (DSW) prior to mobilization   MOD-Include family in multidisciplinary POC discussions     Problem: Compromised Activity/Mobility  Goal: Activity/Mobility Interventions  Outcome: Progressing  Flowsheets (Taken 10/18/2023 1217)  Activity/Mobility Interventions: Pad bony prominences, TAP Seated positioning system when OOB, Promote PMP, Reposition q 2 hrs / turn clock, Offload heels     Problem: Safety  Goal: Patient will be free from injury during hospitalization  Outcome: Progressing  Flowsheets (Taken 10/18/2023 1217)  Patient will be free from injury during hospitalization:   Assess patient's risk for falls and implement fall prevention plan of care per policy   Use  appropriate transfer methods   Provide and maintain safe environment   Ensure appropriate safety devices are available at the bedside   Hourly rounding   Include patient/ family/ care giver in decisions related to safety  Goal: Patient will be free from infection during hospitalization  Outcome: Progressing  Flowsheets (Taken 10/18/2023 1217)  Free from Infection during hospitalization:   Assess and monitor for signs and symptoms of infection   Monitor lab/diagnostic results   Monitor all insertion sites (i.e. indwelling lines, tubes, urinary catheters, and drains)   Encourage patient and family to use good hand hygiene technique     Problem: Impaired Mobility  Goal: Mobility/Activity is maintained at optimal level for patient  Outcome: Progressing  Flowsheets (Taken 10/18/2023 1217)  Mobility/activity is maintained at optimal level for patient:   Encourage independent activity per ability   Consult/collaborate with Physical Therapy and/or Occupational Therapy     Problem: Diabetes: Glucose Imbalance  Goal: Blood glucose stable at established goal  Outcome: Progressing  Flowsheets (Taken 10/18/2023 1217)  Blood glucose stable at established goal:   Monitor lab values   Include patient/family in decisions related to nutrition/dietary selections   Assess for hypoglycemia /hyperglycemia   Monitor/assess vital signs   Ensure adequate hydration

## 2023-10-18 NOTE — Nursing Progress Note (Signed)
 Geriatric Focused Assessment    Is the patient age 85 or older?Yes    Sleep:   Does the patient have an ineffective sleeping pattern? No      Problems with feeding:    Does the patient have an adequate appetite? Yes   Is the patient able to feed themself? Yes    Incontinence:   Is the patient incontient of bowel or bladder? No      Confusion:   Does the patient have any signs or symptoms of deliruim? No      Evidence of falls:   Is the patient a documented high fall risk? Yes    Skin Breakdown:   Does the patient have any areas of skin breakdown? No     Are there any risk factors? Yes             Shift: 0700-1900     Patient is alert and oriented x 4. VSS stable. Pt is not in distress.     Oxygen:  Room air         IV Site:  20 g on right AC     Blood sugars AC/HS:     Diet: Heart Healthy; Renal. Pt tolerated meal well.     Mobility: bed mobility and ambulates inside the room using walker with 1 person assist     Pain:  Pt denies any complaints of pain     DVT prophylaxis: lovenox        Plan: orthostatic; visual acuity test; safety and fall prevention        Patient belongings, call light and room phone are within reach of the patient. 3/4 bed rails up, wheels are locked, patient's bed is in the lowest postion. Purposeful hourly rounding continues. Fall precaution in place.

## 2023-10-19 LAB — WHOLE BLOOD GLUCOSE POCT
Whole Blood Glucose POCT: 131 mg/dL — ABNORMAL HIGH (ref 70–100)
Whole Blood Glucose POCT: 287 mg/dL — ABNORMAL HIGH (ref 70–100)
Whole Blood Glucose POCT: 198 mg/dL — ABNORMAL HIGH (ref 70–100)

## 2023-10-19 MED ORDER — CANDESARTAN CILEXETIL 4 MG PO TABS
4.0000 mg | ORAL_TABLET | Freq: Every day | ORAL | Status: AC | PRN
Start: 2023-10-19 — End: ?

## 2023-10-19 MED ORDER — MIDODRINE HCL 2.5 MG PO TABS
2.5000 mg | ORAL_TABLET | Freq: Every day | ORAL | Status: AC | PRN
Start: 2023-10-19 — End: ?

## 2023-10-19 MED ORDER — MIDODRINE HCL 2.5 MG PO TABS
2.5000 mg | ORAL_TABLET | Freq: Every morning | ORAL | Status: AC
Start: 2023-10-20 — End: ?

## 2023-10-19 MED ORDER — HYDRALAZINE HCL 20 MG/ML IJ SOLN
10.0000 mg | Freq: Once | INTRAMUSCULAR | Status: AC
Start: 2023-10-19 — End: 2023-10-19
  Administered 2023-10-19: 10 mg via INTRAVENOUS
  Filled 2023-10-19: qty 1

## 2023-10-19 MED ORDER — MIDODRINE HCL 5 MG PO TABS
2.5000 mg | ORAL_TABLET | Freq: Every day | ORAL | Status: DC | PRN
Start: 2023-10-19 — End: 2023-10-19

## 2023-10-19 NOTE — PT Progress Note (Signed)
 Physical Therapy Treatment  [name]  Post Acute Care Therapy Recommendations:     Discharge Recommendations:  SNF    If SNF  recommended discharge disposition is not available, patient will need hands on assist for all OOB mobility and home with HHPT, increased assistance.     DME needs IF patient is discharging home: Front wheel walker, [facility]    Therapy discharge recommendations may change with patient status.  Please refer to most recent note for up-to-date recommendations.           [facility]  [address]  [place] [address]  [phone]    Physical Therapy Treatment    Patient:  Vanessa Kelly        MRN#:  [id number]  Unit:  3B ONCOLOGY GENERAL MEDICINE        Room/Bed:  M330/M330-02    Medical Diagnosis: Facial laceration, initial encounter [S01.81XA]  Injury of head, initial encounter [S09.90XA]  Closed fracture of orbit, initial encounter (CMS/HCC) [S02.85XA]  Hypertension, unspecified type [I10]    Time of treatment:  PT Received On: 10/19/23  Start Time: 1134   Stop Time: 1225  Time Calculation (min): 51 min       Treatment #: PT Visit Number: 1/7    Patient's medical condition is appropriate for Physical Therapy intervention at this time.    Assessment   Pt semi-supine in bed with HOB elevated upon arrival. Pt expressing situational frustrations; supportive listening and reassurance provided. Pt agreeable to participate with mild encouragement. Pt requires maxA to don knee high ted hose at start of session in supine position. MaxA to don abdominal binder at EOB. Educated on how to determine appropriate fit (snug, but not too tight and pt should be able to take a deep breath comfortably). Pt able to amb increased distances this date; pt denies pain for majority of session, though reports feeling generalized soreness. Pt does report some pain R eye/forehead area after ambulating; eye pain/discomfort is also why pt unable to tolerate looking upright when ambulating. Pt  with improved demeanor after her personal aides from the [facility] arrive during session who also provide emotional support.   Pt able to sit EOB with SBA for static and sitting balance ~15 minutes; able to anterior weight shift to give a hug to one of her personal aides from the [facility] while at bedside with SBA and no LOB. Pt with ~2 instances of semi-controlled posterior trunk lean that pt able to self-correct with increased time. Pt continues to require increased time and demonstrates increased effort with all mobility. SBP elevated throughout session; RN made aware. All of pt questions addressed; partially receptive to education/ rationale for current d/c recommendations and acute care PT goals. Pt requires continued skilled PT interventions in order to increase pt independence with functional mobility, gait, activity tolerance, balance, pain management, and to reduce pt risk of falls.     PMP - Progressive Mobility Protocol   PMP Activity: Step 6 - Walks in Room  Distance Walked (ft) (Step 6,7): 80 Feet     Plan     Continue plan of care.    Plan  Risks/Benefits/POC Discussed with Pt/Family: With patient/family  Treatment/Interventions: Exercise;Gait training;Stair training;Neuromuscular re-education;Functional transfer training;LE strengthening/ROM;Endurance training;Bed mobility;Equipment eval/education;Patient/family training  PT Frequency: 3-4x/wk    Interdisciplinary Communication: RN    Subjective   At this moment in time I'm not having any pain. I was able to sleep better last night.  Patient is agreeable to participation in the therapy session. Nursing clears patient for therapy.  Pain: Pt unable to quantify  Location: R eye s/p ambulation   Therapist Intervention: positioned for comfort and RN notified, lights turned off   Patient is satisfied with therapist intervention.    Vitals:      10/19/23 1150 10/19/23 1154 10/19/23 1156   Vital Signs   Heart Rate 80 73 93   BP 184/68 186/64 156/65   BP Location  --    --  Left arm   BP Method  --   --  Automatic   MAP (mmHg) 107 105 95   Patient Position Lying  (HOB elevated ~10 degrees (as found); knee high ted hose in place) Sitting  (at EOB unsupported. Mild dizziness initially. ted hose and abdominal binder in place) Standing  (asymptomatic; ted hose and abdominal binder in place)      10/19/23 1207   Vital Signs   Heart Rate 86   BP (!) 201/82   BP Location Left arm   BP Method Automatic   MAP (mmHg) (!) 122   Patient Position Sitting  (s/p ambulating; ted hose and abdominal binder in place)       Objective     Precautions/ Contraindications:   Precautions  Weight Bearing Status: no restrictions  Other Precautions: Fall, orthostatic BP - ted hose and abdominal binder when sitting up/OOB mobility    Patient is in bed with PrimaFit (external female catheter) and intravenous access in place.      Functional Mobility:  Rolling: SBA with increased time  Supine to Sit: SBA with increased time and effort; use of bed rails  Scooting: SBA  Sit to Supine: minA for BLE elevation from pt caregivers  Sit to Stand: SBA with RW, increased time and effort   Stand to Sit: CGA, slow, controlled descent    Gait:   WB status: no restrictions  Assistive Device: RW  Assist Level: CGA  Distance: ~80 ft  Pattern: decreased cadence, pushes RW outside safe BOS     Balance Training:  Static and dynamic sitting EOB x ~15 minutes with close SBA d/t intermittent posterior trunk lean requiring increased time for self-correction    Educated the patient to role of physical therapy, plan of care, goals  of therapy, rationale for progressing mobility and safety with mobility and ADLs.    RN notified of session outcome and that patient was left in bed comfortably positioned with all needs met and equipment intact.   Safety measures include: handoff to nurse/clin tech/ unit secretary completed, oriented to call bell and placed within reach, personal items within reach, assistive device positioned out of reach,  and bed placed in lowest position.   Mobility and ADL status posted at bedside and within E.M.R.    Goals per Eval/ Re-eval:   Goals  Goal Formulation: With patient/family  Time for Goal Acheivement: 7 visits  Goals: Select goal  Pt Will Roll Left: modified independent  Pt Will Roll Right: modified independent  Pt Will Go Supine To Sit: modified independent  Pt Will Perform Sit To Supine: modified independent  Pt Will Perform Sit to Stand: with supervision (and RW)  Pt Will Transfer Bed/Chair: with supervision (and RW)  Pt Will Ambulate: 51-100 feet, with rolling walker, with supervision      Therapist PPE during session procedural mask and gloves     Signature:  Anastasia Sofia, PT, DPT  10/19/2023 1:41 PM        (  For scheduling questions, please contact rehab tech x [phone])

## 2023-10-19 NOTE — Progress Notes (Addendum)
 10/19/23 1404   Medicare Checklist   Is this a Medicare patient? Yes   3 midnight inpatient qualifying stay (SNF only) Yes   Discharge Disposition   Patient preference/choice provided? N/A   Physical Discharge Disposition SNF   Receiving facility, unit and room number: [id number]   Nursing report phone number: Report #[phone]   Mode of Transportation Ambulance   Pick up time 1600   Patient/Family/POA notified of transfer plan Yes   Patient agreeable to discharge plan/expected d/c date? Yes   Family/POA agreeable to discharge plan/expected d/c date? Yes   Bedside nurse notified of transport plan? Yes   CM Interventions   Follow up appointment scheduled?(For PNA, COPD, MI) No   Reason no follow up scheduled? Family to schedule   Referral made for home health RN visit? No, Other (comment)   Multidisciplinary rounds/family meeting before d/c? Yes     DCP: [facility], Room#147A and report# [phone]. Transport MMT 1600, The patient, daughter Bernice, and RN notified

## 2023-10-19 NOTE — Plan of Care (Signed)
 Problem: Pain interferes with ability to perform ADL  Goal: Pain at adequate level as identified by patient  Outcome: Progressing  Flowsheets (Taken 10/18/2023 1217 by Roseann Lavonne Dines, RN)  Pain at adequate level as identified by patient:   Identify patient comfort function goal   Assess for risk of opioid induced respiratory depression, including snoring/sleep apnea. Alert healthcare team of risk factors identified.   Assess pain on admission, during daily assessment and/or before any as needed intervention(s)   Reassess pain within 30-60 minutes of any procedure/intervention, per Pain Assessment, Intervention, Reassessment (AIR) Cycle   Evaluate if patient comfort function goal is met   Evaluate patient's satisfaction with pain management progress   Offer non-pharmacological pain management interventions   Consult/collaborate with Physical Therapy, Occupational Therapy, and/or Speech Therapy     Problem: Side Effects from Pain Analgesia  Goal: Patient will experience minimal side effects of analgesic therapy  Outcome: Progressing  Flowsheets (Taken 10/17/2023 0820 by Kathlene Camp, RN)  Patient will experience minimal side effects of analgesic therapy:   Monitor/assess patient's respiratory status (RR depth, effort, breath sounds)   Assess for changes in cognitive function   Prevent/manage side effects per LIP orders (i.e. nausea, vomiting, pruritus, constipation, urinary retention, etc.)   Evaluate for opioid-induced sedation with appropriate assessment tool (i.e. POSS)     Problem: Moderate/High Fall Risk Score >5  Goal: Patient will remain free of falls  Outcome: Progressing  Flowsheets (Taken 10/18/2023 2300)  High (Greater than 13):   HIGH-Consider use of low bed   HIGH-Apply yellow Fall Risk arm band     Problem: Safety  Goal: Patient will be free from injury during hospitalization  Outcome: Progressing  Flowsheets (Taken 10/18/2023 1217 by Roseann Lavonne Dines, RN)  Patient will be free  from injury during hospitalization:   Assess patient's risk for falls and implement fall prevention plan of care per policy   Use appropriate transfer methods   Provide and maintain safe environment   Ensure appropriate safety devices are available at the bedside   Hourly rounding   Include patient/ family/ care giver in decisions related to safety     Problem: Impaired Mobility  Goal: Mobility/Activity is maintained at optimal level for patient  Outcome: Progressing  Flowsheets (Taken 10/18/2023 1217 by Roseann Lavonne Dines, RN)  Mobility/activity is maintained at optimal level for patient:   Encourage independent activity per ability   Consult/collaborate with Physical Therapy and/or Occupational Therapy     Problem: Diabetes: Glucose Imbalance  Goal: Blood glucose stable at established goal  Outcome: Progressing  Flowsheets (Taken 10/18/2023 1217 by Roseann Lavonne Dines, RN)  Blood glucose stable at established goal:   Monitor lab values   Include patient/family in decisions related to nutrition/dietary selections   Assess for hypoglycemia /hyperglycemia   Monitor/assess vital signs   Ensure adequate hydration

## 2023-10-19 NOTE — Discharge Instr - Activity (Signed)
 As tolerated

## 2023-10-19 NOTE — Progress Notes (Addendum)
 MMT confirmed 1600 pick up
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
## 2023-10-19 NOTE — Plan of Care (Addendum)
 Pt alert and oriented x 4. Drowsy this am., stated I had melatonin and I slept very well had breakfast sitting on side of bed. Vital signs stable, midodrine  po held. Pt denies chest pain, shortens of breath and minimal discomfort on forehead. Dressing left forehead clear dry and intact. Bacitrin ointment applied to incision on low lip, and bruises on r/side of face.  Ambulate with walker with therapist and after asked for tylenol  po for headache with positive results. Call bell on reach to avoid falss.  Problem: Pain interferes with ability to perform ADL  Goal: Pain at adequate level as identified by patient  Outcome: Progressing  Flowsheets (Taken 10/18/2023 1217 by Roseann Lavonne Dines, RN)  Pain at adequate level as identified by patient:   Identify patient comfort function goal   Assess for risk of opioid induced respiratory depression, including snoring/sleep apnea. Alert healthcare team of risk factors identified.   Assess pain on admission, during daily assessment and/or before any as needed intervention(s)   Reassess pain within 30-60 minutes of any procedure/intervention, per Pain Assessment, Intervention, Reassessment (AIR) Cycle   Evaluate if patient comfort function goal is met   Evaluate patient's satisfaction with pain management progress   Offer non-pharmacological pain management interventions   Consult/collaborate with Physical Therapy, Occupational Therapy, and/or Speech Therapy     Problem: Side Effects from Pain Analgesia  Goal: Patient will experience minimal side effects of analgesic therapy  Outcome: Progressing  Flowsheets (Taken 10/17/2023 0820 by Kathlene Camp, RN)  Patient will experience minimal side effects of analgesic therapy:   Monitor/assess patient's respiratory status (RR depth, effort, breath sounds)   Assess for changes in cognitive function   Prevent/manage side effects per LIP orders (i.e. nausea, vomiting, pruritus, constipation, urinary retention, etc.)   Evaluate for  opioid-induced sedation with appropriate assessment tool (i.e. POSS)     Problem: Moderate/High Fall Risk Score >5  Goal: Patient will remain free of falls  Outcome: Progressing  Flowsheets (Taken 10/19/2023 1000)  High (Greater than 13):   HIGH-Visual cue at entrance to patient's room   HIGH-Bed alarm on at all times while patient in bed   HIGH-Utilize chair pad alarm for patient while in the chair   HIGH-Apply yellow Fall Risk arm band   HIGH-Consider use of low bed     Problem: Compromised Activity/Mobility  Goal: Activity/Mobility Interventions  Outcome: Progressing  Flowsheets (Taken 10/19/2023 0748)  Activity/Mobility Interventions: Pad bony prominences, TAP Seated positioning system when OOB, Promote PMP, Reposition q 2 hrs / turn clock, Offload heels     Problem: Compromised Friction/Shear  Goal: Friction and Shear Interventions  Outcome: Progressing     Problem: Compromised Sensory Perception  Goal: Sensory Perception Interventions  Outcome: Progressing  Flowsheets (Taken 10/19/2023 0748)  Sensory Perception Interventions: Offload heels, Pad bony prominences, Reposition q 2hrs/turn Clock, Q2 hour skin assessment under devices if present     Problem: Compromised Moisture  Goal: Moisture level Interventions  Outcome: Progressing  Flowsheets (Taken 10/19/2023 0748)  Moisture level Interventions: Moisture wicking products, Moisture barrier cream     Problem: Safety  Goal: Patient will be free from injury during hospitalization  Outcome: Progressing  Flowsheets (Taken 10/18/2023 1217 by Roseann Lavonne Dines, RN)  Patient will be free from injury during hospitalization:   Assess patient's risk for falls and implement fall prevention plan of care per policy   Use appropriate transfer methods   Provide and maintain safe environment   Ensure appropriate safety devices are available  at the bedside   Hourly rounding   Include patient/ family/ care giver in decisions related to safety  Goal: Patient will be free  from infection during hospitalization  Outcome: Progressing  Flowsheets (Taken 10/18/2023 1217 by Roseann Lavonne Dines, RN)  Free from Infection during hospitalization:   Assess and monitor for signs and symptoms of infection   Monitor lab/diagnostic results   Monitor all insertion sites (i.e. indwelling lines, tubes, urinary catheters, and drains)   Encourage patient and family to use good hand hygiene technique     Problem: Impaired Mobility  Goal: Mobility/Activity is maintained at optimal level for patient  Outcome: Progressing  Flowsheets (Taken 10/18/2023 1217 by Roseann Lavonne Dines, RN)  Mobility/activity is maintained at optimal level for patient:   Encourage independent activity per ability   Consult/collaborate with Physical Therapy and/or Occupational Therapy     Problem: Diabetes: Glucose Imbalance  Goal: Blood glucose stable at established goal  Outcome: Progressing  Flowsheets (Taken 10/18/2023 1217 by Roseann Lavonne Dines, RN)  Blood glucose stable at established goal:   Monitor lab values   Include patient/family in decisions related to nutrition/dietary selections   Assess for hypoglycemia /hyperglycemia   Monitor/assess vital signs   Ensure adequate hydration

## 2023-10-19 NOTE — Discharge Instr - AVS First Page (Addendum)
 Reason for your Hospital Admission:     Orthostatic hypotension    Fall and head injury       Instructions for after your discharge:    Take Midodrine  2.5 mg daily in the morning   Take the next dose afternoon only if BP is less than 120

## 2023-10-19 NOTE — Discharge Summary (Signed)
 DISCHARGE SUMMARY/PROGRESS NOTE      Date Time: 10/19/23 2:34 PM  Patient Name: Vanessa Kelly  Attending Physician: Lovina Estelle, MD  Primary Care Physician: Aneita Dorothyann BRAVO, NP    Date of Admission:   10/14/2023    Date of Discharge:   10/19/2023     Admitting Diagnosis:     Active Hospital Problems    Diagnosis POA    Principal Problem: Injury of head, initial encounter Yes      Resolved Hospital Problems   No resolved problems to display.         Discharge Dx:   Principal Diagnosis: Injury of head, initial encounter  Active Hospital Problems    Diagnosis    Injury of head, initial encounter       Consultations:   Treatment Team:   Lovina Estelle, MD  Jiles Sergio GAILS, MD  Sherrlyn Hadassah FERNS, RN  Reatha Wyline Janifer Mateo     Pending Results, Recommendations & Instructions to providers after discharge:     Micro / Labs / Path pending:  Unresulted Labs       None              Procedures performed:   Radiology: all results from this admission  CT Head WO Contrast  Result Date: 10/18/2023  1. No acute intracranial hemorrhage is detected. 2. There is mild subcortical, deep, and periventricular leukomalacia in both cerebral hemispheres.  This is suggestive of chronic small vessel ischemic change. 3. There is again seen to be soft tissue injury in the lateral right forehead. Belvie Lack, MD 10/18/2023 1:45 PM    XR Abdomen Portable  Result Date: 10/17/2023  1. Air and stool throughout the colon with mild colonic ileus. No bowel obstruction. 2. No abnormal calcifications over the kidneys. Splenic artery calcification patient. Barnie Lamp, MD 10/17/2023 3:02 PM    CT Sinus Facial Bones without Contrast  Addendum Date: 10/17/2023  ADDENDUM: This addendum is to clarify a typographical error in the initial dictation.  The error occurs in the [facility] IMPRESSIONsection of the report. The following text appeared:  FINDINGS: There is a right frontal and right periorbital soft tissue swelling and  hematoma extending over the right lateral orbital rim. There is orbital emphysema. There is right-sided retrobulbar soft tissue and infiltration. There is comminuted fracture along the left medial orbital wall/lamina papyracea with herniation of fat through the osseous defect. Additionally, note is made of a small hematoma within the left extraconal space adjacent to the medial rectus musculature. There is no evidence of muscular entrapment. The left orbit is intact. There is marked thickening of the ventral and posterolateral wall of the right maxillary with opacification of the visualized right maxillary sinus. The zygomatic arches, pterygoid plates and hard palate are intact. The nasal bones and bridge are intact. The nasal septum is midline. Both mandibular condyles are well articulated at the temporomandibular joints. The middle ear cavities and mastoid air cells are clear. IMPRESSION: 1.Right-sided facial soft tissue injury. 2.Comminuted fracture along the left medial orbital wall/lamina papyracea with herniation of fat through the osseous defect. There is right-sided orbital emphysema and right-sided retrobulbar are soft tissue stranding. Additionally, note is made of a small hematoma within the left extraconal space adjacent to the medial rectus musculature. There is no evidence of muscular entrapment. 3. These urgent findings were relayed via secure EPIC message with and acknowledged by [facility] at 8:44 PM on 10/14/2023.  The corrected text should read:  FINDINGS: There is a right frontal and right periorbital soft tissue swelling and hematoma extending over the right lateral orbital rim. There is orbital emphysema. There is right-sided retrobulbar soft tissue and infiltration. There is comminuted fracture along the RIGHT medial orbital wall/lamina papyracea with herniation of fat through the osseous defect. Additionally, note is made of a small hematoma within the extraconal space adjacent to the medial  rectus musculature. There is no evidence of muscular entrapment. The left orbit is intact. There is marked thickening of the ventral and posterolateral wall of the right maxillary with opacification of the visualized right maxillary sinus. The zygomatic arches, pterygoid plates and hard palate are intact. The nasal bones and bridge are intact. The nasal septum is midline. Both mandibular condyles are well articulated at the temporomandibular joints. The middle ear cavities and mastoid air cells are clear. IMPRESSION: 1.Right-sided facial soft tissue injury. 2.Comminuted fracture along the RIGHT medial orbital wall/lamina papyracea with herniation of fat through the osseous defect. There is right-sided orbital emphysema and right-sided retrobulbar are soft tissue stranding. Additionally, note is made of a small hematoma within the extraconal space adjacent to the medial rectus musculature. There is no evidence of muscular entrapment. 3. These urgent findings were relayed via secure EPIC message with and acknowledged by [facility] at 8:44 PM on 10/14/2023.  Butler Blanch, MD 10/17/2023 9:13 AM    Result Date: 10/17/2023   1.Right-sided facial soft tissue injury. 2.Comminuted fracture along the left medial orbital wall/lamina papyracea with herniation of fat through the osseous defect. There is right-sided orbital emphysema and right-sided retrobulbar are soft tissue stranding. Additionally, note is made of a small hematoma within the left extraconal space adjacent to the medial rectus musculature. There is no evidence of muscular entrapment. 3. These urgent findings were relayed via secure EPIC message with and acknowledged by [facility] at 8:44 PM on 10/14/2023.  Butler Blanch, MD 10/14/2023 8:45 PM    XR Chest AP Portable  Result Date: 10/15/2023  No acute abnormal findings. Deward Holt, MD 10/15/2023 3:14 PM    CT Hip Right WO Contrast  Result Date: 10/14/2023  1.No evidence of acute bony injury of the right hip. Consider  further corroboration with MR imaging to rule out occult fracture if patient has difficulty weightbearing. 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 Franky CHRISTELLA Louder, MD 10/14/2023 8:49 PM    CT Head without Contrast  Result Date: 10/14/2023   1.Right-sided facial soft tissue injury. Please see separate facial bone CT report. 2.No CT evidence of acute intracranial hemorrhage, herniation or hydrocephalus. 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 Butler Blanch, MD 10/14/2023 8:34 PM    CT Cervical Spine without Contrast  Result Date: 10/14/2023  1. No cervical spine fracture is detected. 2.  Multilevel cervical spondylosis.  Butler Blanch, MD 10/14/2023 8:31 PM    Ultrasound renal kidney  Result Date: 09/26/2023  1. Increase in the echotexture of both kidneys which can be seen in medical renal disease. There is no evidence of hydronephrosis, discrete solid mass or discernible calculus within either kidney. There is a 2.1 cm septated cyst within the upper pole of the left kidney. 2. Limited evaluation of the urinary bladder with no gross abnormality. There is a post void residual of 147 cc. 3. The remainder is as above. Lindie Cunning, MD 09/26/2023 10:10 PM     Hospital Course:     KRUPA STEGE is a 83 y.o. female with  PMH of syncope, orthostatic hypotension, CAD s/p CABG, HTN, HLD, DM2, anemia, GERD presents after fall vs syncope with head trauma at nursing home.  Found to have right orbital wall fracture     #mechanical fall vs  syncope  #history of syncope d/t orthostatic hypotension  #Multiple falls at home  #right hip pain  #Supine hypertension   CTH- negative  CT cervical- negative  CT right hip -negative for acute pathology   -TTE shows LVEF 69% and no significant valvulopathy  -fall precautions  -PT/OT eval --> SNF  -6/16: orthostatics positive.  Patient reports using compression stockings and midodrine  as needed at home.  500 cc IV fluid,  compression stocking, abdominal binder, and reassess orthostatic vital signs.  Adjust midodrine  as needed     Patient started to have new symptom including some headache and vertigo at rest, repeat head CT without new abnormality     -6/17: orthostatics remain positive but patient refused morning midodrine . Pretty significant supine hypertension noted, limiting treatment. Will continue with midodrine  in the morning and afternoon -- hold night dose to minimize supine hypertension.  Patient should sleep at 30 to 45 degrees to minimize supine hypertension. Patient should keep bedside commode after discharge to avoid walking long distances at nighttime when she is not on midodrine .  Patient should avoid l  Echo :Left ventricular systolic function is normal with an ejection fraction 69%, Mild AR and TR     Pt is being [place] on midodrin 2.5 mg daily in the morning   2.5 mg PRN afternoon , PRN if BP less than 120     She needs outpt follow up with PCP and Cardiology Dr Jiles      #Lower abdominal pain   - Patient reports lower abdominal pain with movement and on palpation which started right after she worked with physical therapy.  She has not had a bowel movement in several days.  No nausea or vomiting.  No fevers or chills.  No urinary symptoms.  -  Xray  Air and stool throughout the colon with mild colonic ileus , bisacodyl  X 2 days     Muscle strain is in the differential     #right orbital fracture  #epistaxis, resolved  #supraorbital laceration   CT facial bones- Right-sided facial soft tissue injury.  2.Comminuted fracture along the left medial orbital wall/lamina papyracea with herniation of fat through the osseous defect. There is right-sided orbital emphysema and right-sided retrobulbar are soft tissue stranding. Additionally, note is made of a small hematoma within the left extraconal space adjacent to the medial rectus musculature. There is no evidence of muscular entrapment.  -ophthalmology consulted, recs  outpatient follow up. Visual acuity has been stable -- patient reports improving visual acuity -- sometimes blurry vision related to clearing which improves with blinking. Swelling improving        #CKD3  Renally dose medications and avoid nephrotoxins     #CAD, s/p CABG  #HLD  -continue aspirin , statin, ranolazine      #GERD  -continue PPI     #anemia, chronic  Hb 9.5, at baseline     #DM2  No meds currently.  -Blood sugar 235 - 287  Hemoglobin A1c is checked twice this year 5.8 and 5.7  - Will avoid metformin due to kidney failure-most recent creatinine is 1.2  - follow up w PCP , may need short actin insulin  to manage post prandial hyperglycemia     Disposition HOME     Patient  condition at discharge:   DISCHARGE DAY EXAM:  Today :  BP 189/62   Pulse 80   Temp 97.7 F (36.5 C) (Oral)   Resp 17   Ht 1.752 m (5' 8.98)   Wt 84.1 kg (185 lb 6.5 oz)   SpO2 100%   BMI 27.40 kg/m   Ranges for the last 24 hours:  Temp:  [97.3 F (36.3 C)-98.1 F (36.7 C)] 97.7 F (36.5 C)  Heart Rate:  [59-93] 80  Resp Rate:  [16-18] 17  BP: (156-218)/(61-82) 189/62  Body mass index is 27.4 kg/m.  General: awake, no acute distress.Right eye ecchymosis   Cardiovascular: regular rate and rhythm, no murmurs, rubs or gallops  Lungs: clear to auscultation bilaterally, without wheezing, rhonchi, or rales  Abdomen: soft, non-tender, non-distended; no palpable masses, no hepatosplenomegaly, normoactive bowel sounds, no rebound or guarding  Extremities: no clubbing, cyanosis, or edema  Neurological:  alert, oriented x 3      Labs/Procedures/Radiology performed:     Recent Labs   Lab 10/18/23  1001 10/15/23  0606 10/14/23  2011   WBC 5.49 6.73 7.59   Hemoglobin 9.5* 9.5* 9.1*   Hematocrit 29.0* 28.5* 27.8*   Platelet Count 155 184 190           Recent Labs   Lab 10/18/23  1001 10/15/23  0606 10/14/23  2011   Sodium 138 136 133*   Potassium 4.9 5.3 5.0   Chloride 110 107 104   CO2 21 25 23    BUN 19 28* 29*   Creatinine 1.2* 1.3* 1.5*    GFR 43.8* 39.8* 33.5*   Glucose 235* 154* 166*   Calcium  10.0 10.0 9.7    Recent Labs   Lab 10/14/23  2011   Alkaline Phosphatase 90   Bilirubin, Total 0.3   Protein, Total 6.6   Albumin 3.3*   ALT 28   AST (SGOT) 27              Invalid input(s): FREET4    Microbiology Results (last 15 days)       ** No results found for the last 360 hours. **                    Discharge Medications:        Medication List        START taking these medications      meclizine  12.5 MG tablet  Commonly known as: ANTIVERT   Take 2 tablets (25 mg) by mouth 3 (three) times daily as needed for Dizziness     ondansetron  4 MG disintegrating tablet  Commonly known as: ZOFRAN -ODT  Take 1 tablet (4 mg) by mouth every 6 (six) hours as needed for Nausea            CHANGE how you take these medications      candesartan 4 MG tablet  Commonly known as: ATACAND  Take 1 tablet (4 mg) by mouth once daily as needed (SBP > 160) Take 1 tablet (4mg ) once daily as needed ( SBP more than 170 )  What changed: additional instructions     * midodrine  2.5 MG tablet  Commonly known as: PROAMATINE   Take 1 tablet (2.5 mg) by mouth once daily as needed (SBP less than 120)  What changed: You were already taking a medication with the same name, and this prescription was added. Make sure you understand how and when to take each.     * midodrine  2.5  MG tablet  Commonly known as: PROAMATINE   Take 1 tablet (2.5 mg) by mouth every morning before breakfast  Start taking on: October 20, 2023  What changed:   medication strength  how much to take  when to take this  additional instructions           * This list has 2 medication(s) that are the same as other medications prescribed for you. Read the directions carefully, and ask your doctor or other care provider to review them with you.                CONTINUE taking these medications      acetaminophen  325 MG tablet  Commonly known as: TYLENOL      albuterol  sulfate HFA 108 (90 Base) MCG/ACT inhaler  Commonly known as:  PROVENTIL      aspirin  EC 81 MG EC tablet     calcium  carbonate 500 MG chewable tablet  Commonly known as: TUMS     Centrum Silver 50+Women Tabs     ergocalciferol  1.25 MG (50000 UT) capsule  Commonly known as: ERGOCALCIFEROL      fexofenadine 180 MG tablet  Commonly known as: ALLEGRA     Flonase  Sensimist 27.5 MCG/SPRAY nasal spray  Generic drug: fluticasone      * lidocaine  5 %  Commonly known as: LIDODERM   Place 1 patch onto the skin every 24 hours Remove & Discard patch within 12 hours or as directed by MD     * lidocaine  5 %  Commonly known as: LIDODERM      lovastatin 40 MG tablet  Commonly known as: MEVACOR     pantoprazole  40 MG tablet  Commonly known as: PROTONIX      polyethylene glycol 17 g packet  Commonly known as: MIRALAX   Take 17 g by mouth daily     ranolazine  500 MG 12 hr tablet  Commonly known as: RANEXA      traMADol  50 MG tablet  Commonly known as: ULTRAM      venlafaxine  37.5 MG 24 hr capsule  Commonly known as: EFFEXOR -XR           * This list has 2 medication(s) that are the same as other medications prescribed for you. Read the directions carefully, and ask your doctor or other care provider to review them with you.                STOP taking these medications      sucralfate  1 g tablet  Commonly known as: CARAFATE                Where to Get Your Medications        Information about where to get these medications is not yet available    Ask your nurse or doctor about these medications  candesartan 4 MG tablet  midodrine  2.5 MG tablet  midodrine  2.5 MG tablet         (FYI: you must refresh the link after final D/C Med reconciliation)    Discharge Instructions:   Disposition: SNF  Discharge Diet: Regular Diet  Wound 10/15/23 Eye;Face Right (Active)   Date First Assessed/Time First Assessed: 10/15/23 1400   Location: Eye;Face  Wound Location Orientation: Right  Wound Description: Edema, bruising, redness  Present on Original Admission: Yes      Assessments 10/15/2023  2:00 PM 10/19/2023 10:00 AM   Wound  Base Description Edema Edema;Erythema   Peri-wound Description Maroon Purple;Maroon   Drainage Amount None None   Topical Other (Comment) --  Dressing Open to air Gauze       No associated orders.       Wound 10/16/23 Face Right;Upper (Active)   Date First Assessed/Time First Assessed: 10/16/23 1400   Location: Face  Wound Location Orientation: Right;Upper  Wound Description: Stitch on forehead      Assessments 10/16/2023  2:00 PM 10/19/2023 10:00 AM   Wound Base Description Erythema Erythema   Peri-wound Description Fragile Fragile   Drainage Amount None None   Treatments Dressing changed --   Topical -- Moisturizing cream   Dressing Other (Comment) --       No associated orders.        Patient was instructed to follow up with:   Primary Care Doctor Aneita Dorothyann BRAVO, NP in  one week  & the following Consultants:        Complete instructions and follow up are in the patient's After Visit Summary    Minutes spent coordinating discharge and reviewing discharge plan: 45 minutes      Signed by: Alycia Sahara, MD, MD    CC: Aneita Dorothyann BRAVO, NP

## 2023-10-19 NOTE — Progress Notes (Signed)
 CM, proactively contacted [facility] at [facility] regarding bed availability for SNF/Rehab. Per ragena she can accept the patient. ragena states she will call back regarding bed availability.    DCP: SNF/Rehab,   [facility], pending medical course, Transportation medical transport

## 2023-10-19 NOTE — Discharge Instr - Diet (Signed)
[place] healthy as tolerated

## 2023-10-20 ENCOUNTER — Other Ambulatory Visit (FREE_STANDING_LABORATORY_FACILITY): Admitting: Family Medicine

## 2023-10-20 DIAGNOSIS — I10 Essential (primary) hypertension: Secondary | ICD-10-CM

## 2023-10-20 LAB — CBC
Absolute nRBC: 0 x10 3/uL (ref <=0.00)
Hematocrit: 26.9 % — ABNORMAL LOW (ref 34.7–43.7)
Hemoglobin: 9 g/dL — ABNORMAL LOW (ref 11.4–14.8)
MCH: 33.6 pg — ABNORMAL HIGH (ref 25.1–33.5)
MCHC: 33.5 g/dL (ref 31.5–35.8)
MCV: 100.4 fL — ABNORMAL HIGH (ref 78.0–96.0)
MPV: 10.4 fL (ref 8.9–12.5)
Platelet Count: 204 x10 3/uL (ref 142–346)
RBC: 2.68 x10 6/uL — ABNORMAL LOW (ref 3.90–5.10)
RDW: 13 % (ref 11–15)
WBC: 5.67 x10 3/uL (ref 3.10–9.50)
nRBC %: 0 /100{WBCs} (ref <=0.0)

## 2023-10-20 LAB — BASIC METABOLIC PANEL
Anion Gap: 8 (ref 5.0–15.0)
BUN: 20 mg/dL (ref 7–21)
CO2: 24 meq/L (ref 17–29)
Calcium: 9.9 mg/dL (ref 7.9–10.2)
Chloride: 105 meq/L (ref 99–111)
Creatinine: 1.4 mg/dL — ABNORMAL HIGH (ref 0.4–1.0)
GFR: 36.4 mL/min/1.73 m2 — ABNORMAL LOW (ref >=60.0)
Glucose: 141 mg/dL — ABNORMAL HIGH (ref 70–100)
Hemolysis Index: 2 {index}
Potassium: 4.4 meq/L (ref 3.5–5.3)
Sodium: 137 meq/L (ref 135–145)

## 2023-10-20 LAB — PREALBUMIN: Prealbumin: 20 mg/dL (ref 16.0–38.0)

## 2023-11-14 ENCOUNTER — Emergency Department

## 2023-11-14 ENCOUNTER — Emergency Department
Admission: EM | Admit: 2023-11-14 | Discharge: 2023-11-14 | Disposition: A | Discharge status: Skilled Nursing Facility | Attending: Emergency Medicine | Admitting: Emergency Medicine

## 2023-11-14 DIAGNOSIS — R42 Dizziness and giddiness: Secondary | ICD-10-CM | POA: Insufficient documentation

## 2023-11-14 DIAGNOSIS — R079 Chest pain, unspecified: Secondary | ICD-10-CM

## 2023-11-14 LAB — ECG 12-LEAD
Atrial Rate: 57 {beats}/min
P Axis: 8 degrees
P-R Interval: 234 ms
Q-T Interval: 396 ms
QRS Duration: 96 ms
QTC Calculation (Bezet): 385 ms
R Axis: -18 degrees
T Axis: 25 degrees
Ventricular Rate: 57 {beats}/min

## 2023-11-14 LAB — COMPREHENSIVE METABOLIC PANEL
ALT: 40 U/L (ref <=55)
AST (SGOT): 37 U/L (ref <=41)
Albumin/Globulin Ratio: 0.8 — ABNORMAL LOW (ref 0.9–2.2)
Albumin: 3.6 g/dL (ref 3.5–5.0)
Alkaline Phosphatase: 98 U/L (ref 37–117)
Anion Gap: 6 (ref 5.0–15.0)
BUN: 22 mg/dL — ABNORMAL HIGH (ref 7–21)
Bilirubin, Total: 0.4 mg/dL (ref 0.2–1.2)
CO2: 24 meq/L (ref 17–29)
Calcium: 10.6 mg/dL — ABNORMAL HIGH (ref 7.9–10.2)
Chloride: 100 meq/L (ref 99–111)
Creatinine: 1.3 mg/dL — ABNORMAL HIGH (ref 0.4–1.0)
GFR: 39.8 mL/min/1.73 m2 — ABNORMAL LOW (ref >=60.0)
Globulin: 4.5 g/dL — ABNORMAL HIGH (ref 2.0–3.6)
Glucose: 106 mg/dL — ABNORMAL HIGH (ref 70–100)
Potassium: 5 meq/L (ref 3.5–5.3)
Protein, Total: 8.1 g/dL (ref 6.0–8.3)
Sodium: 130 meq/L — ABNORMAL LOW (ref 135–145)

## 2023-11-14 LAB — LAB USE ONLY - CBC WITH DIFFERENTIAL
Absolute Basophils: 0.03 x10 3/uL (ref 0.00–0.08)
Absolute Eosinophils: 0.2 x10 3/uL (ref 0.00–0.44)
Absolute Immature Granulocytes: 0.01 x10 3/uL (ref 0.00–0.07)
Absolute Lymphocytes: 1.33 x10 3/uL (ref 0.42–3.22)
Absolute Monocytes: 0.45 x10 3/uL (ref 0.21–0.85)
Absolute Neutrophils: 2.72 x10 3/uL (ref 1.10–6.33)
Absolute nRBC: 0 x10 3/uL (ref <=0.00)
Basophils %: 0.6 %
Eosinophils %: 4.2 %
Hematocrit: 31.2 % — ABNORMAL LOW (ref 34.7–43.7)
Hemoglobin: 10.5 g/dL — ABNORMAL LOW (ref 11.4–14.8)
Immature Granulocytes %: 0.2 %
Lymphocytes %: 28.1 %
MCH: 32.8 pg (ref 25.1–33.5)
MCHC: 33.7 g/dL (ref 31.5–35.8)
MCV: 97.5 fL — ABNORMAL HIGH (ref 78.0–96.0)
MPV: 10 fL (ref 8.9–12.5)
Monocytes %: 9.5 %
Neutrophils %: 57.4 %
Platelet Count: 218 x10 3/uL (ref 142–346)
Preliminary Absolute Neutrophil Count: 2.72 x10 3/uL (ref 1.10–6.33)
RBC: 3.2 x10 6/uL — ABNORMAL LOW (ref 3.90–5.10)
RDW: 12 % (ref 11–15)
WBC: 4.74 x10 3/uL (ref 3.10–9.50)
nRBC %: 0 /100{WBCs} (ref <=0.0)

## 2023-11-14 LAB — URINALYSIS WITH REFLEX TO MICROSCOPIC EXAM - REFLEX TO CULTURE
Urine Bilirubin: NEGATIVE
Urine Blood: NEGATIVE
Urine Ketones: NEGATIVE mg/dL
Urine Nitrite: NEGATIVE
Urine Protein: NEGATIVE
Urine Specific Gravity: 1.01 (ref 1.001–1.035)
Urine Urobilinogen: NORMAL mg/dL (ref 0.2–2.0)
Urine pH: 6 (ref 5.0–8.0)

## 2023-11-14 MED ORDER — HYDRALAZINE HCL 20 MG/ML IJ SOLN
10.0000 mg | Freq: Once | INTRAMUSCULAR | Status: DC
Start: 2023-11-14 — End: 2023-11-14
  Filled 2023-11-14: qty 1

## 2023-11-14 MED ORDER — SODIUM CHLORIDE 0.9 % IV BOLUS
1000.0000 mL | Freq: Once | INTRAVENOUS | Status: AC
Start: 2023-11-14 — End: 2023-11-14
  Administered 2023-11-14: 1000 mL via INTRAVENOUS
  Filled 2023-11-14: qty 1000

## 2023-11-14 MED ORDER — HYDRALAZINE HCL 20 MG/ML IJ SOLN
10.0000 mg | Freq: Once | INTRAMUSCULAR | Status: AC
Start: 2023-11-14 — End: 2023-11-14
  Administered 2023-11-14: 10 mg via INTRAVENOUS
  Filled 2023-11-14: qty 1

## 2023-11-14 MED ORDER — ONDANSETRON HCL 4 MG/2ML IJ SOLN
4.0000 mg | Freq: Once | INTRAMUSCULAR | Status: AC
Start: 2023-11-14 — End: 2023-11-14
  Administered 2023-11-14: 4 mg via INTRAVENOUS
  Filled 2023-11-14: qty 2

## 2023-11-14 MED ORDER — VALSARTAN 40 MG PO TABS
40.0000 mg | ORAL_TABLET | Freq: Once | ORAL | Status: AC
Start: 2023-11-14 — End: 2023-11-14
  Administered 2023-11-14: 40 mg via ORAL
  Filled 2023-11-14: qty 1

## 2023-11-14 MED ORDER — SCOPOLAMINE 1 MG/3DAYS TD PT72
1.0000 | MEDICATED_PATCH | TRANSDERMAL | 0 refills | Status: DC | PRN
Start: 2023-11-14 — End: 2023-11-26

## 2023-11-14 NOTE — EDIE (Signed)
 [facility] NOTIFICATION 11/14/2023 06:22 Vanessa Kelly, Vanessa Kelly DOB: [birth year] MRN: [id number]    Criteria Met      ED Visit - SNF Summary    Security and Safety  No Security Events were found.  Patient Return from SNF  Reason for Transfer  Artificial Intelligence (AI) Extractions from SNF Notes     1.  The Change In Condition/s reported on this CIC Evaluation are/were: Other change in condition   2.  Code Status: FULL CODE   3.  Advance directives are:   4.  Resident alerts and oriented x4   5.  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    PERSONAL HISTORY OF MALIGNANT NEOPLASM OF BREAST    PAIN IN RIGHT HIP    ORTHOSTATIC HYPOTENSION    MUSCLE WEAKNESS (GENERALIZED)    LOWER ABDOMINAL PAIN, UNSPECIFIED    LACERATION WITHOUT FOREIGN BODY OF RIGHT EYELID AND PERIOCULAR AREA, SUBSEQUENT ENCOUNTER    HYPERLIPIDEMIA, UNSPECIFIED    GASTRO-ESOPHAGEAL REFLUX DISEASE WITHOUT ESOPHAGITIS    FRACTURE OF ORBITAL FLOOR, RIGHT SIDE, SUBSEQUENT ENCOUNTER FOR FRACTURE WITH ROUTINE HEALING    ESSENTIAL (PRIMARY) HYPERTENSION    DYSURIA    DIFFICULTY IN WALKING, NOT ELSEWHERE CLASSIFIED    CHRONIC KIDNEY DISEASE, STAGE 3 UNSPECIFIED    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   Family and Practitioner  Attending Physician Phone Facility   No practitioner found.  [phone] [facility]    [address], [place], [place], [address]       Medications  Last administered date for each medication within the last 14 days   Medication Name Dosage Dose Per Unit Administration Date    in the last 14 days     insulin  lispro  (HUMALOG  KWIKPEN) 100 unit/mL InPn Inject as per sliding scale: if 150 - 200 = 2 u; 201 - 250 = 4 u; 251 - 300 = 6 u; 301 - 350 = 8 u; 351+ = 9 u, subcutaneously before meals for DM2 NULL 11/09/2023 11:25 AM   fluticasone  (CHILDREN'S FLONASE  SENSIMIST) 27.5 mcg/actuation nasal spray 1 spray in both nostrils one time a day for Seasonal Allergy NULL 11/09/2023 08:48 AM   fexofenadine (ALLEGRA) 180 mg Tablet Give 1 tablet by mouth one time a day for Seasonal Allergy NULL 11/09/2023 08:48 AM   ranolazine  (RANEXA ) 500 MG extended release tablet Give 1 tablet by mouth every 12 hours for CAD NULL 11/09/2023 08:34 AM   polyethylene glycol (MIRALAX ) 17 gram/dose powder Give 17 gram by mouth one time a day for Constipation (dissolve in liquid) Hold for loose stools NULL 11/09/2023 08:34 AM   aspirin  81 MG EC tablet Give 1 tablet by mouth one time a day for PPX blood clotting NULL 11/09/2023 08:33 AM   candesartan  (ATACAND ) 4 mg tablet **DAW** Give 1 tablet by mouth every 24 hours as needed for SBP andgt;170 NULL 11/08/2023 09:02 PM   venlafaxine  (EFFEXOR  XR) 37.5 mg SR-capsule 24 Hr Give 2 capsule by mouth at bedtime for Depression NULL 11/08/2023 08:49 PM   pantoprazole  (PROTONIX ) 40 mg  tablet Give 1 tablet by mouth at bedtime for GERD NULL 11/08/2023 08:48 PM   lovastatin 40 MG tablet Give 1 tablet by mouth at bedtime for Hyperlipidemia NULL 11/08/2023 08:46 PM   midodrine  (ORVATEN , PROAMATINE ) 2.5 MG Tab Give 1 tablet by mouth one time a day for Orthostatic hypotension HOLD dose for SBP andgt;150 NULL 11/07/2023 09:06 AM   acetaminophen  (TYLENOL ) 325 mg tablet Give 2 tablet by mouth every 6 hours as needed for Pain or Headache Scale 1-3 NULL 11/04/2023 10:49 AM   ergocalciferol  (VITAMIN D2) 50,000 unit PO CAPS Give 1 capsule by mouth one time a day every Sat for Supplement Do not chew or crush. NULL 11/04/2023 08:46 AM     ED Care Guidelines  There are currently no ED Care Guidelines for this patient. Please check your  facility's medical records system.        Prescription Monitoring Program  Narx Score not available at this time.    E.D. Visit Count (12 mo.)  Facility Visits   [place] [facility] 5   [facility] 1   Total 6   Note: Visits indicate total known visits.     Recent Emergency Department Visit Summary  Date Facility [facility] Type Diagnoses or Chief Complaint    Nov 14, 2023  [place] [facility]  [place]  Emergency      Medic: dizziness      Oct 14, 2023  [place] [facility]  [place]  [place]  Emergency      Essential (primary) hypertension      Fracture of orbit, unspecified, initial encounter for closed fracture      Laceration without foreign body of other part of head, initial encounter      Unspecified injury of head, initial encounter      Hip Pain      Head Injury      Laceration      fall      Jul 15, 2023  [place] [facility] [place] [facility]  [place]  [place]  Emergency      Altered mental status, unspecified      Altered Mental Status      Slurred Speech      Extremity Weakness      Urinary retention      Jun 21, 2023  [place] [facility]  [place]  [place]  Emergency      Noninfective gastroenteritis and colitis, unspecified      Emesis      Diarrhea      Medic      Jun 21, 2023  [place] [facility] [place] [facility]  [place]  [place]  Emergency      Pain in right shoulder      Noninfective gastroenteritis and colitis, unspecified      Arm Pain      Generalized weakness      Medic      Dec 04, 2022  [place] [facility]  [place]  [place]  Emergency      Dizziness and giddiness      Fall      Syncope      medic        Recent Inpatient Visit Summary  Date Facility [facility] Type Diagnoses or Chief Complaint    Oct 14, 2023   [facility] [place] [facility]  [place]  [place]  Medical Surgical      Fracture of orbit, unspecified,  initial encounter for closed fracture      Unspecified injury of head, initial encounter      Laceration without foreign body of other part of head, initial encounter      Essential (primary) hypertension      Aug 17, 2023  [place] [facility] [place] [facility]  [place]  [place]  Medical Surgical      Tremor, unspecified      Essential (primary) hypertension      Jul 15, 2023  [place] [facility] [place] [facility]  [place]  [place]  Medical Surgical      Acute cough      Wheezing      Acute cystitis without hematuria      Altered mental status, unspecified        Care Team  Provider Specialty Phone Fax Service Dates   CARON QUITTER [phone] Nurse Practitioner [phone] Current    BEHIRI, AMR , DO Internal Medicine   Current    Beheshtin, Darya K., MD Family Medicine   Current    DE LAS ALAS, NICOLE, MSN, FNP-BC Nurse Practitioner: Family [phone]  Current    CINDA ANES MD, M.D. Internal Medicine: Infectious Disease [phone]  Current    DIAKIWSKY, CATHERINE, E, (FNP-BC) Nurse Practitioner: Family [phone] Current    KAY, ARAS F, M.D. Internal Medicine   Current    ETHA CHARLEEN HERO, NP Nurse Practitioner: Adult Health   Current    Albina Sender, M.D. Internal Medicine   Current    GOLDA BAR MD, MD Internal Medicine: Nephrology [phone]  Current    Monette Hollering, MD Hospitalist [phone]  Current    CLAUDENE JANUS BIRCH DPM, DPM Podiatrist [phone]  Current      [facility]  This patient has registered at the [facility] Emergency Department  For more information visit: [email or web address]   Selected Source Information  Reason for Transfer: Source of Notes Extraction  Order for STAT CBC, BMP called to [facility], spoke with Faudia. ESR and CRP cannot be collected STAT per the lab representative, separated lab sheet has been completed for the CRP and ESR. The Change In Condition/s reported on this CIC Evaluation are/were: Other change in condition 1 At the time of evaluation resident/patient vital signs, weight and blood sugar were: - Blood Pressure: BP 166/64 - 11/14/2023 10:41 Position: Lying  l/arm - Pulse: P 65 - 11/14/2023 10:41 Pulse Type: Regular - RR: R 18.0 - 11/14/2023 07:49 - Temp: T 97.0 - 11/14/2023 07:49 Route: Forehead (non-contact) - Weight: W 178.3 lb - 11/10/2023 12:18 Scale: Standing - Pulse Oximetry: O2 97.0 % - 11/14/2023 10:41 Method: Room Air - Blood Glucose: BS 164.0 - 11/14/2023 07:45 Resident/Patient is in the facility for: Post Acute Care Primary Diagnosis is: S09.90XD UNSPECIFIED INJURY OF HEAD, SUBSEQUENT ENCOUNTER M62.81 MUSCLE WEAKNESS (GENERALIZED) R26.2 DIFFICULTY IN WALKING, NOT ELSEWHERE CLASSIFIED M25.551 PAIN IN RIGHT HIP S02.31XD FRACTURE OF ORBITAL FLOOR, RIGHT SIDE, SUBSEQUENT ENCOUNTER FOR FRACTURE WITH ROUTINE HEALING E11.9 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 N18.30 CHRONIC KIDNEY DISEASE, STAGE 3 UNSPECIFIED I25.810 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 I95.1 ORTHOSTATIC HYPOTENSION W19.XXXD UNSPECIFIED FALL, SUBSEQUENT ENCOUNTER Z85.3 PERSONAL HISTORY OF MALIGNANT NEOPLASM OF BREAST S01.111D LACERATION WITHOUT FOREIGN BODY OF RIGHT EYELID AND PERIOCULAR AREA, SUBSEQUENT ENCOUNTER E78.5 HYPERLIPIDEMIA, UNSPECIFIED K21.9 GASTRO-ESOPHAGEAL REFLUX DISEASE WITHOUT ESOPHAGITIS D64.9 ANEMIA, UNSPECIFIED I10 ESSENTIAL (PRIMARY) HYPERTENSION R10.30 LOWER ABDOMINAL PAIN,  UNSPECIFIED R30.0 DYSURIA Relevant medical history is: Diabetes Code Status: FULL CODE 2 Advance directives are: 3 Resident/Patient had the following medications changes in the past week: Resident/Patient is on Coumadin/warfarin: The result of last INR: Date: Resident/Patient is on anticoagulant other than warfarin: Resident/Patient is nw:Ybenhobrzfpr medication(s)/Insulin  Positive findings reported on the resident/patient evaluation for this change in condition were: - Mental Status Evaluation: No changes observed - Functional Status Evaluation: No changes observed - Behavioral Status Evaluation: - Respiratory Status Evaluation: - Cardiovascular Status Evaluation:  - Abdominal/GI Status Evaluation: - GU/Urine Status Evaluation: - Skin Status Evaluation: - Pain Status Evaluation: Does the resident/patient have pain? - Neurological Status Evaluation: Dizziness or unsteadiness Nursing observations, evaluation, and recommendations jmz:Mzdpizwu mentioned to writer that I feel headache to right side from a previous fracture and I am very dizzy; I feel like the room is spinning around. Writer notified NP Damien. Primary Care Provider responded with the following feedback: A. Recommendations: NP Emily ordered for STAT CBC, BMP, ESR, CRP. Labs were not drawn, resident was transferred to hospital due to increase in dizziness and headache. B. New Testing Orders: - Blood Tests Other - STAT CBC, BMP. Not drawn, resident tranferred to hospital C. New Intervention Orders: - Other - NA Cardiovascular Observations: Heart Rhythm: regular Right Pedal Pulse: strong Left Pedal Pulse: Strong Right lower extremity edema: no edema Left extremity edema: no edema Vitals: BP 127/65 - 11/13/2023 20:49 Position: Sitting r/arm T 98.2 - 11/13/2023 17:00 Route: Forehead (non-contact) P 76 - 11/13/2023 20:49 Pulse Type: Regular R 19.0 - 11/13/2023 17:00 O2 97.0 % - 11/13/2023 17:00 Method: Room Air Respiratory Observations: Lung Sounds:Clear to auscultation Respirations:regular Cough: no cough Oxygen in Use: Use of CPAP/BiPAP: No Tracheostomy: no Gastrointestinal Observations: MLV:wnmfjo MOV:wnmfjo OLV:wnmfjo OOV:wnmfjo Bowel Status: continent GI Symptoms: none observed. Enteral Feeding tube: no TPN use: No Genitourinary Observations: Bladder Status:continent Skin Observations: Wound present: No. IV Therapy IV access present: No. Pain: no Non Pharmacological interventions provided: Pharmacological interventions provided: Skilled Services Provided: Observation and Assessment for the need for Nursing Intervention Management and Evaluations of a resident Teaching and Training Activities Isolation Status: No  isolation Skilled Nursing Focus: Resident alerts and oriented x4 4 no sign of distress noted/observed. V/S stable, no pain or discomfort. consumed all of meals served and tolerated well. po fluids encouraged. PM meds administered per order without issue. Call bell within reach. Resident in bed, peacefully asleep. Bed in lowest position for safety. Resident remains stable and comfortable throughout the evening shift. Vanessa Kelly Chief Complaint: Patient seen per nursing request for dizziness History of present illness: 83 yo F admitted for skilled services post hosp stay 6/14-6/20/25 for fall with head trauma at nursing home, found to have R orbital wall fracture. Also noted with lower abdominal pain and mild colonic ileus. Stabilized and transferred to SNF for ongoing care. 7/14 called to bedside for severe dizziness pt reports since Saturday. Pt reports dizziness with feeling of increased pain and pressure on right side of her face when moving head to the right. VSS, pt denies any orthostatic hypotension over last several days, states dizziness has not decreased when laying down. d/w pt stat labs and close monitoring, she confirms understanding and is agreeable. 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 I95.1 ORTHOSTATIC HYPOTENSION W19.XXXD UNSPECIFIED FALL, SUBSEQUENT ENCOUNTER Z85.3 PERSONAL HISTORY OF MALIGNANT NEOPLASM OF BREAST S01.111D LACERATION WITHOUT  FOREIGN BODY OF RIGHT EYELID AND PERIOCULAR AREA, SUBSEQUENT ENCOUNTER E78.5 HYPERLIPIDEMIA, UNSPECIFIED K21.9 GASTRO-ESOPHAGEAL REFLUX DISEASE WITHOUT ESOPHAGITIS D64.9 ANEMIA, UNSPECIFIED I10 ESSENTIAL (PRIMARY) HYPERTENSION R10.30 LOWER ABDOMINAL PAIN, UNSPECIFIED R30.0 DYSURIA Family History review and not relevant to this case: Social History: Medications: Medications reviewed Review of systems: 0 Constitutional: Generalized weakness Eyes: ENT: Cardiovascular: Pulmonary: Gastrointestinal: Lymphatics/Heme: Genitourinary: Endocrine: Musculoskeletal: Skin: Neurological: Headaches Psych: 1 Comments: dizziness right sided headache Vital Signs BP: BP 116/68 - 11/13/2023 13:34 Position: Sitting r/arm Pulse: P 70 - 11/13/2023 09:13 Pulse Type: Regular Respiration: R 18.0 - 11/13/2023 09:13 Temperature: T 98.8 - 11/13/2023 09:13 Route: Forehead (non-contact) O2 sats: O2 98.0 % - 11/13/2023 09:13 Method: Room Air Physical Exam General: No acute distress Eyes: PERRLA EOMI Conjunctivae clear ENT: No masses or lesions Neck: Supple Cardiovascular: S1, S2 Symmetric pulses Lungs: CTA b/l Normal respiratory effort Abdomen: Lymphatics: No appreciable enlarged nodes of neck, axilla or groin GU: Deferred Musculoskeletal: FROM Muscle Weakness Unstable Gait Skin: Neuro: AAO Psych: Normal Mood Normal affect Comments: skin-see detailed skin assessment Results Laboratory results/Radiology reports: Hospital labs: 10/18/23 WBC 5.49, HH 9.5 / 29, Plt 155 BUN 19, Cr 1.2, Na 138, K 4.9 Assessment and Plan: Dizziness -stat labs -PO hydration encouraged -PT/OT at bedside, will provide support when OOB -monitor labs/VS/BG, VSS currently -consider transfer to hospital for evaluation/imaging if dizziness/right-sided head pain worsens as this is new onset -meds reviewed for contributing factors to dizziness, none found DM2 -HA1C 5.7-5.8 -NCS diet, RD c/s -not on meds; metformin not ordered 2/2 renal function -updated RD. increase sliding insulin  scale  -pt reports her goal BG as 110-120s Orthostatic hypotension -compression stockings, abd binder when OOB -c/w midodrine  2.5mg  in AM and PRN for SBP <120 -c/w PRN candesartan  for HTN -rec to sleep with HOB 30-45degrees to minimize supine HTN, and recommended to have BSC to avoid walking long distances overnight -f/u Cards Dr. Jiles Hx fall; Gen weakness -PT/OT pt reports still sometimes shaky during therapy sessions and does not think BP has been low during these episodes -fall precautions -meds reviewed CAD; hx HTN; HLD -c/w ASA, ranolazine , lovastatin -c/w PRN candesartan  for SBP >170 Fall w/head trauma R orbital wall fracture Supraorbital lac -ophthalmology recs OP follow up to monitor vision- pt reports has appt early July. advised to notify if any acute visual changes Lower abd pain Risk for constipation/ileus -hx mild colonic ileus, received bisacodyl  x 2 days per [place] summary -c/w miralax  QD -monitor CKD3 -avoid nephrotox -monitor UOP Full code Charts and medications reviewed Discussed with: Patient Staff Number of minutes spent on care/assessment: 30 Comments: a/p cont: Vit D insufficiency -c/w vit D weekly GERD -c/w PPI Risk for malnutrition -Trend wts and meal intakes, assist w/meals PRN -RD consult. The Change In Condition/s reported on this CIC Evaluation are/were: Other change in condition 1 At the time of evaluation resident/patient vital signs, weight and blood sugar were: - Blood Pressure: BP 116/68 - 11/13/2023 13:34 Position: Sitting r/arm - Pulse: P 70 - 11/13/2023 09:13 Pulse Type: Regular - RR: R 18.0 - 11/13/2023 09:13 - Temp: T 98.8 - 11/13/2023 09:13 Route: Forehead (non-contact) - Weight: W 178.3 lb - 11/10/2023 12:18 Scale: Standing -  Pulse Oximetry: O2 98.0 % - 11/13/2023 09:13 Method: Room Air - Blood Glucose: BS 276.0 - 11/13/2023 10:54 Resident/Patient is in the facility for: Post Acute Care Primary Diagnosis is: S09.90XD UNSPECIFIED INJURY OF HEAD, SUBSEQUENT ENCOUNTER M62.81 MUSCLE WEAKNESS  (GENERALIZED) R26.2 DIFFICULTY IN WALKING, NOT ELSEWHERE CLASSIFIED M25.551 PAIN IN RIGHT HIP S02.31XD FRACTURE OF ORBITAL FLOOR, RIGHT SIDE, SUBSEQUENT ENCOUNTER FOR FRACTURE WITH ROUTINE HEALING E11.9 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 N18.30 CHRONIC KIDNEY DISEASE, STAGE 3 UNSPECIFIED I25.810 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 I95.1 ORTHOSTATIC HYPOTENSION W19.XXXD UNSPECIFIED FALL, SUBSEQUENT ENCOUNTER Z85.3 PERSONAL HISTORY OF MALIGNANT NEOPLASM OF BREAST S01.111D LACERATION WITHOUT FOREIGN BODY OF RIGHT EYELID AND PERIOCULAR AREA, SUBSEQUENT ENCOUNTER E78.5 HYPERLIPIDEMIA, UNSPECIFIED K21.9 GASTRO-ESOPHAGEAL REFLUX DISEASE WITHOUT ESOPHAGITIS D64.9 ANEMIA, UNSPECIFIED I10 ESSENTIAL (PRIMARY) HYPERTENSION R10.30 LOWER ABDOMINAL PAIN, UNSPECIFIED R30.0 DYSURIA Relevant medical history is: Diabetes Code Status: FULL CODE 2 Advance directives are: 3 Resident/Patient had the following medications changes in the past week: none Resident/Patient is on Coumadin/warfarin:No The result of last INR: Date: Resident/Patient is on anticoagulant other than warfarin: No Resident/Patient is on: Positive findings reported on the resident/patient evaluation for this change in condition were: - Mental Status Evaluation: No changes observed - Functional Status Evaluation: No changes observed - Behavioral Status Evaluation: - Respiratory Status Evaluation: - Cardiovascular Status Evaluation: - Abdominal/GI Status Evaluation: - GU/Urine Status Evaluation: - Skin Status Evaluation: - Pain Status Evaluation: Does the resident/patient have pain? - Neurological Status Evaluation: Nursing observations, evaluation, and recommendations jmz:Mzdpizwu complaint of headache in the morning 5 ,nurse checked vitals ,vitals was within normal range,last blood sugar is 128 .nurse notified NP emily .she ordered labs: CBC, BMP, CRP, ESR .nurse is monitoring the resident frequently for her headache.  po fluid encouraged as tolerated . gave ice water  twice . resident is self RP. Primary Care Provider responded with the following feedback: A. Recommendations: labs: CBC, BMP, CRP, ESR B. New Testing Orders: - Blood Tests - C. New Intervention Orders: - Other - labs: CBC, BMP, CRP, ESR Cardiovascular Observations: Heart Rhythm: regular Right Pedal Pulse: strong Left Pedal Pulse: Strong Right lower extremity edema: no edema Left extremity edema: no edema Vitals: BP 137/79 - 11/13/2023 09:13 Position: Sitting l/arm T 98.8 - 11/13/2023 09:13 Route: Forehead (non-contact) P 70 - 11/13/2023 09:13 Pulse Type: Regular R 18.0 - 11/13/2023 09:13 O2 98.0 % - 11/13/2023 09:13 Method: Room Air Respiratory Observations: Lung Sounds:Clear to auscultation Respirations:regular Cough: no cough Oxygen in Use: Use of CPAP/BiPAP: No Tracheostomy: no Gastrointestinal Observations: MLV:wnmfjo MOV:wnmfjo OLV:wnmfjo OOV:wnmfjo Bowel Status: continent GI Symptoms: none observed. Enteral Feeding tube: no TPN use: No Genitourinary Observations: Bladder Status:continent Skin Observations: Wound present: No. IV Therapy IV access present: No. Pain: no Non Pharmacological interventions provided: Pharmacological interventions provided: Skilled Services Provided: Observation and Assessment for the need for Nursing Intervention Management and Evaluations of a resident Teaching and Training Activities Isolation Status: No isolation Skilled Nursing Focus: Resident alerts and oriented x4 4 no sign of distress noted/observed. V/S stable, no pain or discomfort. consumed all of meals served and tolerated well. po fluids encouraged. meds administered per order without issue. Call bell within reach. Resident is sitting on her chair near bed .Bed in lowest position for safety. Resident complaints of headache ,nurse notified NP, Nurse currently working her resident orders and change in condition . Care conference meeting date and time: 11/07/2023 12:15 PM Care  level review: Full Resuscitation   *AI Extracted  The ?Extracted from SNF  Notes? section is a tool designed to provide a concise extractive summary of SNF nursing notes using artificial intelligence (AI) to assist clinicians in the emergency department. Any decision or action based on the suggestions from this tool should be made based on your professional judgment and at your own discretion. The ?Extracted from SNF Notes? section relies on information provided by Skilled Nursing Facility user(s) through [employer]. These summarized extractions are subject to the limitations inherent in AI technology. It is possible for the tool to extract inappropriate or incorrect information due to various factors such as inaccurate or incomplete clinical notes, bias in training data, or limitations in model accuracy.   [Healthcare professionals/Users] should not rely primarily on the information provided by the tool to make a clinical decision about an individual patient and it is important to thoroughly and independently review the patient's condition and the source notes attached before proceeding.       PLEASE NOTE:     1.   Any care recommendations and other clinical information are provided as guidelines or for historical purposes only, and providers should exercise their own clinical judgment when providing care.    2.   You may only use this information for purposes of treatment, payment or health care operations activities, and subject to the limitations of applicable [facility] Policies.    3.   You should consult directly with the organization that provided a care guideline or other clinical history with any questions about additional information or accuracy or completeness of information provided.    ? 2025 [facility] - [email or web address]

## 2023-11-14 NOTE — EDIE (Signed)
 [facility] NOTIFICATION 11/14/2023 06:22 Vanessa Kelly, Vanessa Kelly DOB: Sep 07, 1940 MRN: [id number]    Criteria Met      5 ED Visits in 12 Months    Security and Safety  No Security Events were found.  ED Care Guidelines  There are currently no ED Care Guidelines for this patient. Please check your facility's medical records system.        Prescription Monitoring Program  Narx Score not available at this time.    E.D. Visit Count (12 mo.)  Facility Visits   [place] [facility] 5   [facility] 1   Total 6   Note: Visits indicate total known visits.     Recent Emergency Department Visit Summary  Date Facility [facility] Type Diagnoses or Chief Complaint    Nov 14, 2023  [facility]  [place]  Emergency      Medic: dizziness      Oct 14, 2023  [place] [facility]  [place]  [place]  Emergency      Essential (primary) hypertension      Fracture of orbit, unspecified, initial encounter for closed fracture      Laceration without foreign body of other part of head, initial encounter      Unspecified injury of head, initial encounter      Hip Pain      Head Injury      Laceration      fall      Jul 15, 2023  [place] [facility] [place] [facility]  [place]  [place]  Emergency      Altered mental status, unspecified      Altered Mental Status      Slurred Speech      Extremity Weakness      Urinary retention      Jun 21, 2023  [place] [facility]  [place]  [place]  Emergency      Noninfective gastroenteritis and colitis, unspecified      Emesis      Diarrhea      Medic      Jun 21, 2023  [place] [facility] [place] [facility]  [place]  [place]  Emergency      Pain in right shoulder      Noninfective gastroenteritis and colitis, unspecified      Arm Pain      Generalized weakness      Medic      Dec 04, 2022  [place] - [facility]  [place]  [place]  Emergency      Dizziness and giddiness      Fall      Syncope      medic        Recent Inpatient Visit Summary  Date Facility [facility] Type Diagnoses or Chief Complaint    Oct 14, 2023  [place] [facility]  [place]  [place]  Medical Surgical      Fracture of orbit, unspecified, initial encounter for closed fracture      Unspecified injury of head, initial encounter      Laceration without foreign body of other part of head, initial encounter      Essential (primary) hypertension      Aug 17, 2023   [facility]  [place]  [place]  Medical Surgical      Tremor, unspecified      Essential (primary) hypertension      Jul 15, 2023  [place] [facility]  [place]  [place]  Medical Surgical      Acute cough      Wheezing      Acute cystitis without hematuria      Altered mental status, unspecified        Care Team  Provider Specialty Phone Fax Service Dates   CARON QUITTER [phone] Nurse Practitioner [phone] Current    BEHIRI, AMR , DO Internal Medicine   Current    Beheshtin, Darya K., MD Family Medicine   Current    DE LAS ALAS, NICOLE, MSN, FNP-BC Nurse Practitioner: Family [phone]  Current    CINDA ANES MD, M.D. Internal Medicine: Infectious Disease [phone]  Current    DIAKIWSKY, CATHERINE, E, (FNP-BC) Nurse Practitioner: Family [phone] Current    KAY, ARAS F, M.D. Internal Medicine   Current    ETHA CHARLEEN HERO, NP Nurse Practitioner: Adult Health   Current    Albina Sender, M.D. Internal Medicine   Current    GOLDA BAR MD, MD Internal Medicine: Nephrology [phone]  Current    Monette Hollering, MD Hospitalist [phone]  Current    CLAUDENE FITZPATRICK D DPM, DPM Podiatrist [phone]  Current      [facility]  This patient has registered at the [facility] Emergency Department  For more information visit: [email or web address]     PLEASE NOTE:     1.   Any care recommendations and other clinical information are provided as guidelines or for historical purposes only, and providers should exercise their own clinical judgment when providing care.     2.   You may only use this information for purposes of treatment, payment or health care operations activities, and subject to the limitations of applicable [facility] Policies.    3.   You should consult directly with the organization that provided a care guideline or other clinical history with any questions about additional information or accuracy or completeness of information provided.    ? 2025 [facility] - [email or web address]

## 2023-11-14 NOTE — ED Provider Notes (Signed)
 [facility] EMERGENCY DEPARTMENT H&P          CLINICAL INFORMATION        HPI:      Chief Complaint: Headache and Dizziness  .    Vanessa Kelly is a 83 y.o. female   History of Present Illness  Vanessa Kelly is an 83 year old female who presents with dizziness and elevated blood pressure following a recent fall and orbital fracture.    Three weeks ago, she fell and sustained a fracture to the orbital bone in her right eye, requiring hospitalization and six stitches. Due to weakness, she was transferred to a rehabilitation facility.    Since Sunday, dizziness occurs when turning her head to the right, both lying down and standing. The dizziness is intermittent and movement-induced. Intermittent headaches accompany the dizziness and elevated blood pressure. Two Tylenol  taken before leaving her previous location alleviated the headache.    Antivert  was tried for dizziness but caused nausea, leading to discontinuation. She feels dehydrated. No chest pain or fever.    History obtained from: patient, , review of prior chart    Nursing Triage Note: pt biba from [facility] re[facility] where she is receiving therapy from a fall 3 wks ago +R orbital fx. pt has a hx of headaches and lightheadedness when her BP is elevated but last 2 days the room is spinning like never before along w/ headaches. EMS BP readings sys in 200s. BG 271. took tylenol  1 hr ago.    ROS:      Positive and negative ROS elements as per HPI.  All other systems reviewed and negative.      Physical Exam:      Pulse 60  BP 188/68  Resp 18  SpO2 100 %  Temp 97.6 F (36.4 C)    Physical Exam  Vitals and nursing note reviewed.   Constitutional:       Appearance: Normal appearance. She is well-developed.   HENT:      Head: Normocephalic and atraumatic.     Eyes:      Extraocular Movements: Extraocular movements intact.      Conjunctiva/sclera: Conjunctivae normal.      Pupils: Pupils are equal, round, and reactive to  light.       Cardiovascular:      Rate and Rhythm: Normal rate and regular rhythm.      Heart sounds: Normal heart sounds.   Pulmonary:      Effort: Pulmonary effort is normal.      Breath sounds: Normal breath sounds.   Abdominal:      General: Bowel sounds are normal.      Palpations: Abdomen is soft.      Tenderness: There is no abdominal tenderness.     Musculoskeletal:         General: Normal range of motion.      Cervical back: Normal range of motion and neck supple.      Right lower leg: No edema.      Left lower leg: No edema.     Skin:     General: Skin is warm and dry.     Neurological:      General: No focal deficit present.      Mental Status: She is alert and oriented to person, place, and time.      Deep Tendon Reflexes: Reflexes are normal and symmetric.      Comments: Left beating nystagmus no pronator   Psychiatric:  Mood and Affect: Mood normal.         Thought Content: Thought content normal.                  PAST HISTORY        Primary Care Provider: Aneita Dorothyann BRAVO, NP        PMH/PSH:    .     Medical History[1]    She has a past surgical history that includes Breast biopsy (Right); Hysterectomy; and CORONARY ARTERY BYPASS.      Social/Family History:      She reports that she quit smoking about 35 years ago. Her smoking use included cigarettes. She has been exposed to tobacco smoke. She has never used smokeless tobacco. She reports that she does not currently use alcohol. She reports that she does not use drugs.    Family History[2]      Listed Medications on Arrival:    .     Home Medications       Med List Status: In Progress Set By: Glinda Smalls, RN at 11/14/2023  1:25 PM              acetaminophen  (TYLENOL ) 325 MG tablet     Take 2 tablets (650 mg) by mouth every 6 (six) hours as needed for Pain (or headache)     albuterol  sulfate HFA (PROVENTIL ) 108 (90 Base) MCG/ACT inhaler          Patient not taking: No sig reported     aspirin  EC 81 MG EC tablet     Take 1 tablet (81  mg) by mouth daily     calcium  carbonate (TUMS) 500 MG chewable tablet     Chew 1 tablet (500 mg) by mouth every 6 (six) hours as needed for Heartburn     candesartan  (ATACAND ) 4 MG tablet     Take 1 tablet (4 mg) by mouth once daily as needed (SBP > 160) Take 1 tablet (4mg ) once daily as needed ( SBP more than 170 )     ergocalciferol  (ERGOCALCIFEROL ) 1.25 MG (50000 UT) capsule     Take 1 capsule (50,000 Units) by mouth once a week On Saturday     fexofenadine (ALLEGRA) 180 MG tablet     Take 1 tablet (180 mg) by mouth daily     fluticasone  (Flonase  Sensimist) 27.5 MCG/SPRAY nasal spray     1 spray by Nasal route daily     INSULIN  LISPRO, 1 UNIT DIAL, SC     Inject into the skin     lidocaine  (LIDODERM ) 5 %     Place 1 patch onto the skin in the morning. Remove & Discard patch within 12 hours or as directed by MD.     lidocaine  (LIDODERM ) 5 %     Place 1 patch onto the skin every 24 hours Remove & Discard patch within 12 hours or as directed by MD     lovastatin (MEVACOR) 40 MG tablet     Take 1 tablet (40 mg) by mouth nightly     meclizine  (ANTIVERT ) 12.5 MG tablet     Take 2 tablets (25 mg) by mouth 3 (three) times daily as needed for Dizziness     midodrine  (PROAMATINE ) 2.5 MG tablet     Take 1 tablet (2.5 mg) by mouth every morning before breakfast     midodrine  (PROAMATINE ) 2.5 MG tablet     Take 1 tablet (2.5 mg) by mouth once daily as  needed (SBP less than 120)     Multiple Vitamins-Minerals (Centrum Silver 50+Women) Tab     Take 1 tablet by mouth daily     ondansetron  (ZOFRAN -ODT) 4 MG disintegrating tablet     Take 1 tablet (4 mg) by mouth every 6 (six) hours as needed for Nausea     pantoprazole  (PROTONIX ) 40 MG tablet     Take 1 tablet (40 mg) by mouth nightly     polyethylene glycol (MIRALAX ) 17 g packet     Take 17 g by mouth daily     ranolazine  (RANEXA ) 500 MG 12 hr tablet     Take 1 tablet (500 mg) by mouth 2 (two) times daily     traMADol  (ULTRAM ) 50 MG tablet     Take 1 tablet (50 mg) by mouth  every 8 (eight) hours as needed for Pain     venlafaxine  (EFFEXOR -XR) 37.5 MG 24 hr capsule     Take 2 capsules (75 mg) by mouth nightly           Allergies: She is allergic to fentanyl and percolone [oxycodone].            VISIT INFORMATION        Clinical Course in the ED:             Medications Given in the ED:    .     ED Medication Orders (From admission, onward)      Start Ordered     Status Ordering Provider    11/14/23 1632 11/14/23 1631  valsartan  (DIOVAN ) tablet 40 mg  Once        Route: Oral  Ordered Dose: 40 mg       Acknowledged Nurah Petrides J    11/14/23 1500 11/14/23 1435  valsartan  (DIOVAN ) tablet 40 mg  Once        Route: Oral  Ordered Dose: 40 mg       Last MAR action: Given Erleen Egner J    11/14/23 1334 11/14/23 1333  sodium chloride  0.9 % bolus 1,000 mL  Once        Route: Intravenous  Ordered Dose: 1,000 mL       Last MAR action: Stopped Joann Kulpa J              Procedures:      Procedures      Interpretations:      EKG: I reviewed and Independently interpreted the patient's EKG as sinus bradycardia 57 no ischemic changes borderline EKG     NIH Stroke Score      Flowsheet Row Most Recent Value   NIH Stroke Scale    Interval Baseline admission to ED   1a. Level of Consciousness 0   1b. LOC Questions (age, month) 0   1c. LOC Commands (Open and close eyes, Grip AND release good hand) 0   2. Best Gaze 0   3. Visual Fields 0   4. Facial Palsy 0   5a. Motor Left Arm: (Arms with palm down X 10 seconds. Sitting = arms at 90 degrees. Supine = arms 45 degrees) 0   5b. Motor Right Arm: (Arms with palm down X 10 seconds. Sitting = arms at 90 degrees Supine = arms 45 degrees) 0   Total Motor Arms 0   6a. Motor Left Leg: (Leg elevated X 5 seconds Supine = Leg 30 degrees) 0   6b. Motor Right Leg: (Leg elevated X 5 seconds Supine =  Leg 30 degrees) 0   Total Motor Legs 0   7. Limb Ataxia (Finger to nose, heel to shin) 0   8. Sensory (Sensation or grimace to pin prick on face, arm, trunk, and leg) 0    9. Best language (Describe picture, name items, read sentences from NIHSS booklet) 0   10. Dysarthria (Read list from NIHSS booklet) 0   11. Extinction and Inattention (formerly Neglect) - Test tactile and visual stimulation 0   NIHSS Total 0             RESULTS        Lab Results:      Results for orders placed or performed during the hospital encounter of 11/14/23 (from the past 24 hours)   Comprehensive Metabolic Panel    Collection Time: 11/14/23  1:48 PM   Result Value    Glucose 106 (H)    BUN 22 (H)    Creatinine 1.3 (H)    Sodium 130 (L)    Potassium 5.0    Chloride 100    CO2 24    Calcium  10.6 (H)    Anion Gap 6.0    GFR 39.8 (L)    AST (SGOT) 37    ALT 40    Alkaline Phosphatase 98    Albumin 3.6    Protein, Total 8.1    Globulin 4.5 (H)    Albumin/Globulin Ratio 0.8 (L)    Bilirubin, Total 0.4   CBC with Differential (Component)    Collection Time: 11/14/23  1:48 PM   Result Value    WBC 4.74    Hemoglobin 10.5 (L)    Hematocrit 31.2 (L)    Platelet Count 218    MPV 10.0    RBC 3.20 (L)    MCV 97.5 (H)    MCH 32.8    MCHC 33.7    RDW 12    nRBC % 0.0    Absolute nRBC 0.00    Preliminary Absolute Neutrophil Count 2.72    Neutrophils % 57.4    Lymphocytes % 28.1    Monocytes % 9.5    Eosinophils % 4.2    Basophils % 0.6    Immature Granulocytes % 0.2    Absolute Neutrophils 2.72    Absolute Lymphocytes 1.33    Absolute Monocytes 0.45    Absolute Eosinophils 0.20    Absolute Basophils 0.03    Absolute Immature Granulocytes 0.01           Radiology Results:      CT Head WO Contrast   Final Result       1.No acute intracranial hemorrhage or mass effect.   2.Mild chronic appearing small vessel ischemic changes.      Reyes Argyle, MD   11/14/2023 3:14 PM                 Visit date: 11/14/2023      CLINICAL SUMMARY          Diagnosis:    .     Final diagnoses:   Vertigo         MDM Notes:    Medical Decision Making  Risk  Prescription drug management.         I am the first provider for this patient.  I reviewed  the vital signs, nursing notes, past medical history, past surgical history, family history and social history.  I have reviewed the patient's previous charts.    83 year old with intermittent  vertigo clearly precipitated by turning her head.  Can relieve symptoms by holding her head still.  Has been treated in the past with Antivert  but makes her feel nauseated.  Head CT no acute findings  Creatinine 123 patient has mild chronic renal insufficiency actually improved from prior reading  Will treat with scopolamine  patch follow-up       Disposition:      Discharge         Discharge Prescriptions       Medication Sig Dispense Auth. Provider    scopolamine  (TRANSDERM-SCOP) Place 1 patch onto the skin every third day as needed (vertigo) 10 patch Lillyauna Jenkinson J, MD                    Scribe Attestation:      I was acting as a Neurosurgeon for Letrice Pollok J, MD on Kinnan,Vanda L        is scribing for me on [place],Daley L. This note and the patient instructions accurately reflect work and decisions made by me.  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

## 2023-11-14 NOTE — ED Triage Notes (Signed)
 83 y.o. F BIBA from rehab facility for 3x days intermittent dizziness/vertigo-like symptoms; pt reports symptoms resolved at this time, occurs when pt turns head to right side.  In rehab for right orbital fx that occurred during fall 3x weeks prior, facility concerned that dizziness is related.  -thinners, -BEFAST exam/no focal deficits noted.  Pt A&Ox4 with patent airway, adequate respiratory effort, warm/dry skin.

## 2023-11-14 NOTE — ED Notes (Signed)
Bed: S 17  Expected date:   Expected time:   Means of arrival:   Comments:  C2 here

## 2023-11-15 LAB — LAB USE ONLY - URINE GRAY CULTURE HOLD TUBE

## 2023-11-22 LAB — ECG 12-LEAD
Atrial Rate: 65 {beats}/min
IHS MUSE NARRATIVE AND IMPRESSION: NORMAL
P Axis: 68 degrees
P-R Interval: 192 ms
Q-T Interval: 410 ms
QRS Duration: 100 ms
QTC Calculation (Bezet): 426 ms
R Axis: 2 degrees
T Axis: 52 degrees
Ventricular Rate: 65 {beats}/min

## 2023-11-26 ENCOUNTER — Emergency Department

## 2023-11-26 ENCOUNTER — Inpatient Hospital Stay
Admission: EM | Admit: 2023-11-26 | Discharge: 2023-11-30 | DRG: 092 | Disposition: A | Discharge status: Home / Self Care | Attending: Internal Medicine | Admitting: Internal Medicine

## 2023-11-26 ENCOUNTER — Inpatient Hospital Stay

## 2023-11-26 DIAGNOSIS — Z87891 Personal history of nicotine dependence: Secondary | ICD-10-CM

## 2023-11-26 DIAGNOSIS — I440 Atrioventricular block, first degree: Secondary | ICD-10-CM | POA: Diagnosis present

## 2023-11-26 DIAGNOSIS — Z794 Long term (current) use of insulin: Secondary | ICD-10-CM

## 2023-11-26 DIAGNOSIS — I639 Cerebral infarction, unspecified: Secondary | ICD-10-CM

## 2023-11-26 DIAGNOSIS — E1151 Type 2 diabetes mellitus with diabetic peripheral angiopathy without gangrene: Secondary | ICD-10-CM | POA: Diagnosis present

## 2023-11-26 DIAGNOSIS — R42 Dizziness and giddiness: Secondary | ICD-10-CM

## 2023-11-26 DIAGNOSIS — I6521 Occlusion and stenosis of right carotid artery: Secondary | ICD-10-CM | POA: Diagnosis present

## 2023-11-26 DIAGNOSIS — I1310 Hypertensive heart and chronic kidney disease without heart failure, with stage 1 through stage 4 chronic kidney disease, or unspecified chronic kidney disease: Secondary | ICD-10-CM | POA: Diagnosis present

## 2023-11-26 DIAGNOSIS — R471 Dysarthria and anarthria: Secondary | ICD-10-CM | POA: Diagnosis present

## 2023-11-26 DIAGNOSIS — J45909 Unspecified asthma, uncomplicated: Secondary | ICD-10-CM | POA: Diagnosis present

## 2023-11-26 DIAGNOSIS — R197 Diarrhea, unspecified: Secondary | ICD-10-CM | POA: Diagnosis present

## 2023-11-26 DIAGNOSIS — G20C Parkinsonism, unspecified: Secondary | ICD-10-CM | POA: Diagnosis present

## 2023-11-26 DIAGNOSIS — R29810 Facial weakness: Principal | ICD-10-CM | POA: Diagnosis present

## 2023-11-26 DIAGNOSIS — R531 Weakness: Secondary | ICD-10-CM | POA: Diagnosis present

## 2023-11-26 DIAGNOSIS — R1310 Dysphagia, unspecified: Secondary | ICD-10-CM | POA: Diagnosis present

## 2023-11-26 DIAGNOSIS — I2510 Atherosclerotic heart disease of native coronary artery without angina pectoris: Secondary | ICD-10-CM | POA: Diagnosis present

## 2023-11-26 DIAGNOSIS — R4182 Altered mental status, unspecified: Secondary | ICD-10-CM | POA: Diagnosis present

## 2023-11-26 DIAGNOSIS — I358 Other nonrheumatic aortic valve disorders: Secondary | ICD-10-CM | POA: Diagnosis present

## 2023-11-26 DIAGNOSIS — Z96651 Presence of right artificial knee joint: Secondary | ICD-10-CM | POA: Diagnosis present

## 2023-11-26 DIAGNOSIS — Z853 Personal history of malignant neoplasm of breast: Secondary | ICD-10-CM

## 2023-11-26 DIAGNOSIS — I951 Orthostatic hypotension: Secondary | ICD-10-CM | POA: Diagnosis present

## 2023-11-26 DIAGNOSIS — Z66 Do not resuscitate: Secondary | ICD-10-CM | POA: Diagnosis present

## 2023-11-26 DIAGNOSIS — I672 Cerebral atherosclerosis: Secondary | ICD-10-CM | POA: Diagnosis present

## 2023-11-26 DIAGNOSIS — R296 Repeated falls: Secondary | ICD-10-CM | POA: Diagnosis present

## 2023-11-26 DIAGNOSIS — T443X5A Adverse effect of other parasympatholytics [anticholinergics and antimuscarinics] and spasmolytics, initial encounter: Secondary | ICD-10-CM | POA: Diagnosis present

## 2023-11-26 DIAGNOSIS — I160 Hypertensive urgency: Secondary | ICD-10-CM | POA: Diagnosis present

## 2023-11-26 DIAGNOSIS — E875 Hyperkalemia: Secondary | ICD-10-CM | POA: Diagnosis present

## 2023-11-26 DIAGNOSIS — D539 Nutritional anemia, unspecified: Secondary | ICD-10-CM | POA: Diagnosis present

## 2023-11-26 DIAGNOSIS — N179 Acute kidney failure, unspecified: Secondary | ICD-10-CM | POA: Diagnosis present

## 2023-11-26 DIAGNOSIS — E860 Dehydration: Secondary | ICD-10-CM | POA: Diagnosis present

## 2023-11-26 DIAGNOSIS — F32A Depression, unspecified: Secondary | ICD-10-CM | POA: Diagnosis present

## 2023-11-26 DIAGNOSIS — E871 Hypo-osmolality and hyponatremia: Secondary | ICD-10-CM | POA: Diagnosis present

## 2023-11-26 DIAGNOSIS — N1831 Chronic kidney disease, stage 3a: Secondary | ICD-10-CM | POA: Diagnosis present

## 2023-11-26 DIAGNOSIS — J811 Chronic pulmonary edema: Secondary | ICD-10-CM | POA: Diagnosis present

## 2023-11-26 DIAGNOSIS — K219 Gastro-esophageal reflux disease without esophagitis: Secondary | ICD-10-CM | POA: Diagnosis present

## 2023-11-26 DIAGNOSIS — Z951 Presence of aortocoronary bypass graft: Secondary | ICD-10-CM

## 2023-11-26 DIAGNOSIS — E1122 Type 2 diabetes mellitus with diabetic chronic kidney disease: Secondary | ICD-10-CM | POA: Diagnosis present

## 2023-11-26 DIAGNOSIS — E785 Hyperlipidemia, unspecified: Secondary | ICD-10-CM | POA: Diagnosis present

## 2023-11-26 DIAGNOSIS — G928 Other toxic encephalopathy: Principal | ICD-10-CM | POA: Diagnosis present

## 2023-11-26 LAB — ECG 12-LEAD
Atrial Rate: 63 {beats}/min
P-R Interval: 250 ms
Q-T Interval: 392 ms
QRS Duration: 98 ms
QTC Calculation (Bezet): 401 ms
R Axis: -22 degrees
T Axis: 23 degrees
Ventricular Rate: 63 {beats}/min

## 2023-11-26 LAB — COMPREHENSIVE METABOLIC PANEL
ALT: 32 U/L (ref <=55)
AST (SGOT): 25 U/L (ref <=41)
Albumin/Globulin Ratio: 1 (ref 0.9–2.2)
Albumin: 3.2 g/dL — ABNORMAL LOW (ref 3.5–5.0)
Alkaline Phosphatase: 83 U/L (ref 37–117)
Anion Gap: 5 (ref 5.0–15.0)
BUN: 23 mg/dL — ABNORMAL HIGH (ref 7–21)
Bilirubin, Total: 0.3 mg/dL (ref 0.2–1.2)
CO2: 24 meq/L (ref 17–29)
Calcium: 9.5 mg/dL (ref 7.9–10.2)
Chloride: 101 meq/L (ref 99–111)
Creatinine: 1.5 mg/dL — ABNORMAL HIGH (ref 0.4–1.0)
GFR: 33.5 mL/min/1.73 m2 — ABNORMAL LOW (ref >=60.0)
Globulin: 3.2 g/dL (ref 2.0–3.6)
Glucose: 84 mg/dL (ref 70–100)
Potassium: 5.4 meq/L — ABNORMAL HIGH (ref 3.5–5.3)
Protein, Total: 6.4 g/dL (ref 6.0–8.3)
Sodium: 130 meq/L — ABNORMAL LOW (ref 135–145)

## 2023-11-26 LAB — LAB USE ONLY - CBC WITH DIFFERENTIAL
Absolute Basophils: 0.05 x10 3/uL (ref 0.00–0.08)
Absolute Eosinophils: 0.16 x10 3/uL (ref 0.00–0.44)
Absolute Immature Granulocytes: 0.02 x10 3/uL (ref 0.00–0.07)
Absolute Lymphocytes: 1.3 x10 3/uL (ref 0.42–3.22)
Absolute Monocytes: 0.56 x10 3/uL (ref 0.21–0.85)
Absolute Neutrophils: 5.22 x10 3/uL (ref 1.10–6.33)
Absolute nRBC: 0 x10 3/uL (ref <=0.00)
Basophils %: 0.7 %
Eosinophils %: 2.2 %
Hematocrit: 27 % — ABNORMAL LOW (ref 34.7–43.7)
Hemoglobin: 9 g/dL — ABNORMAL LOW (ref 11.4–14.8)
Immature Granulocytes %: 0.3 %
Lymphocytes %: 17.8 %
MCH: 33 pg (ref 25.1–33.5)
MCHC: 33.3 g/dL (ref 31.5–35.8)
MCV: 98.9 fL — ABNORMAL HIGH (ref 78.0–96.0)
MPV: 9.6 fL (ref 8.9–12.5)
Monocytes %: 7.7 %
Neutrophils %: 71.3 %
Platelet Count: 202 x10 3/uL (ref 142–346)
Preliminary Absolute Neutrophil Count: 5.22 x10 3/uL (ref 1.10–6.33)
RBC: 2.73 x10 6/uL — ABNORMAL LOW (ref 3.90–5.10)
RDW: 13 % (ref 11–15)
WBC: 7.31 x10 3/uL (ref 3.10–9.50)
nRBC %: 0 /100{WBCs} (ref <=0.0)

## 2023-11-26 LAB — URINALYSIS WITH REFLEX TO MICROSCOPIC EXAM - REFLEX TO CULTURE
Urine Bilirubin: NEGATIVE
Urine Blood: NEGATIVE
Urine Glucose: NEGATIVE
Urine Ketones: NEGATIVE mg/dL
Urine Leukocyte Esterase: NEGATIVE
Urine Nitrite: NEGATIVE
Urine Protein: NEGATIVE
Urine Specific Gravity: 1.021 (ref 1.001–1.035)
Urine Urobilinogen: NORMAL mg/dL (ref 0.2–2.0)
Urine pH: 6.5 (ref 5.0–8.0)

## 2023-11-26 LAB — PT/INR
INR: 1 (ref 0.9–1.1)
PT: 11.8 s (ref 10.1–12.9)

## 2023-11-26 LAB — LAB USE ONLY - URINE GRAY CULTURE HOLD TUBE

## 2023-11-26 LAB — APTT: PTT: 26 s — ABNORMAL LOW (ref 27–39)

## 2023-11-26 LAB — WHOLE BLOOD GLUCOSE POCT
Whole Blood Glucose POCT: 111 mg/dL — ABNORMAL HIGH (ref 70–100)
Whole Blood Glucose POCT: 114 mg/dL — ABNORMAL HIGH (ref 70–100)

## 2023-11-26 LAB — HIGH SENSITIVITY TROPONIN-I: hs Troponin: 11.3 ng/L (ref <=14.0)

## 2023-11-26 LAB — CORTISOL: Cortisol: 10.7 ug/dL

## 2023-11-26 MED ORDER — DEXTROSE 50 % IV SOLN
12.5000 g | INTRAVENOUS | Status: DC | PRN
Start: 2023-11-26 — End: 2023-11-30

## 2023-11-26 MED ORDER — ATORVASTATIN CALCIUM 40 MG PO TABS
40.0000 mg | ORAL_TABLET | Freq: Every evening | ORAL | Status: DC
Start: 2023-11-26 — End: 2023-11-28

## 2023-11-26 MED ORDER — SODIUM CHLORIDE 0.9 % IV SOLN
INTRAVENOUS | Status: DC
Start: 2023-11-26 — End: 2023-11-27
  Filled 2023-11-26: qty 1000

## 2023-11-26 MED ORDER — DEXTROSE 10 % IV BOLUS
12.5000 g | INTRAVENOUS | Status: DC | PRN
Start: 2023-11-26 — End: 2023-11-26

## 2023-11-26 MED ORDER — GLUCOSE 40 % PO GEL (WRAP)
15.0000 g | ORAL | Status: DC | PRN
Start: 2023-11-26 — End: 2023-11-30

## 2023-11-26 MED ORDER — ENOXAPARIN SODIUM 40 MG/0.4ML IJ SOSY
40.0000 mg | PREFILLED_SYRINGE | Freq: Every day | INTRAMUSCULAR | Status: DC
Start: 2023-11-26 — End: 2023-11-30
  Administered 2023-11-26 – 2023-11-30 (×5): 40 mg via SUBCUTANEOUS
  Filled 2023-11-26 (×5): qty 0.4

## 2023-11-26 MED ORDER — SALINE SPRAY 0.65 % NA SOLN
2.0000 | NASAL | Status: DC | PRN
Start: 2023-11-26 — End: 2023-11-30

## 2023-11-26 MED ORDER — ACETAMINOPHEN 650 MG RE SUPP
325.0000 mg | RECTAL | Status: DC | PRN
Start: 2023-11-26 — End: 2023-11-26

## 2023-11-26 MED ORDER — VENLAFAXINE HCL ER 75 MG PO CP24
75.0000 mg | ORAL_CAPSULE | Freq: Every evening | ORAL | Status: DC
Start: 2023-11-27 — End: 2023-11-29
  Administered 2023-11-27 – 2023-11-28 (×2): 75 mg via ORAL
  Filled 2023-11-26 (×2): qty 1

## 2023-11-26 MED ORDER — MAGNESIUM SULFATE IN D5W 1-5 GM/100ML-% IV SOLN
1.0000 g | INTRAVENOUS | Status: DC | PRN
Start: 2023-11-26 — End: 2023-11-30

## 2023-11-26 MED ORDER — GLUCAGON 1 MG IJ SOLR (WRAP)
1.0000 mg | INTRAMUSCULAR | Status: DC | PRN
Start: 2023-11-26 — End: 2023-11-30

## 2023-11-26 MED ORDER — LIDOCAINE 5 % EX PTCH
2.0000 | MEDICATED_PATCH | CUTANEOUS | Status: DC
Start: 2023-11-26 — End: 2023-11-30
  Administered 2023-11-27 (×2): 2 via TRANSDERMAL
  Filled 2023-11-26 (×2): qty 2

## 2023-11-26 MED ORDER — ATORVASTATIN CALCIUM 40 MG PO TABS
40.0000 mg | ORAL_TABLET | Freq: Every evening | ORAL | Status: DC
Start: 2023-11-26 — End: 2023-11-29
  Administered 2023-11-27 – 2023-11-28 (×2): 40 mg via ORAL
  Filled 2023-11-26 (×3): qty 1

## 2023-11-26 MED ORDER — POTASSIUM CHLORIDE 10 MEQ/100ML IV SOLN (WRAP)
10.0000 meq | INTRAVENOUS | Status: DC | PRN
Start: 2023-11-26 — End: 2023-11-30

## 2023-11-26 MED ORDER — INSULIN LISPRO 100 UNIT/ML SOLN (WRAP)
1.0000 [IU] | Freq: Every evening | Status: DC
Start: 2023-11-26 — End: 2023-11-30
  Administered 2023-11-28: 1 [IU] via SUBCUTANEOUS
  Filled 2023-11-26: qty 3

## 2023-11-26 MED ORDER — TENECTEPLASE IV PUSH (STROKE - ED/ECC)
0.2500 mg/kg | PACK | Freq: Once | INTRAVENOUS | Status: DC
Start: 2023-11-26 — End: 2023-11-26
  Filled 2023-11-26: qty 10

## 2023-11-26 MED ORDER — POTASSIUM CHLORIDE CRYS ER 20 MEQ PO TBCR
0.0000 meq | EXTENDED_RELEASE_TABLET | ORAL | Status: DC | PRN
Start: 2023-11-26 — End: 2023-11-30

## 2023-11-26 MED ORDER — SODIUM CHLORIDE 0.9 % IV MBP
0.0000 mg/h | INTRAVENOUS | Status: DC | PRN
Start: 2023-11-26 — End: 2023-11-26
  Filled 2023-11-26: qty 10

## 2023-11-26 MED ORDER — ACETAMINOPHEN 650 MG RE SUPP
650.0000 mg | RECTAL | Status: DC | PRN
Start: 2023-11-26 — End: 2023-11-26

## 2023-11-26 MED ORDER — PANTOPRAZOLE SODIUM 40 MG IV SOLR
40.0000 mg | Freq: Every day | INTRAVENOUS | Status: DC
Start: 2023-11-26 — End: 2023-11-28
  Administered 2023-11-26 – 2023-11-28 (×3): 40 mg via INTRAVENOUS
  Filled 2023-11-26 (×3): qty 40

## 2023-11-26 MED ORDER — ASPIRIN 300 MG RE SUPP
300.0000 mg | Freq: Once | RECTAL | Status: AC
Start: 2023-11-26 — End: 2023-11-26
  Administered 2023-11-26: 300 mg via RECTAL
  Filled 2023-11-26: qty 1

## 2023-11-26 MED ORDER — LABETALOL HCL 5 MG/ML IV SOLN (WRAP)
10.0000 mg | INTRAVENOUS | Status: DC | PRN
Start: 2023-11-26 — End: 2023-11-29

## 2023-11-26 MED ORDER — ASPIRIN 325 MG PO TABS
325.0000 mg | ORAL_TABLET | Freq: Once | ORAL | Status: DC
Start: 2023-11-26 — End: 2023-11-26

## 2023-11-26 MED ORDER — MELATONIN 3 MG PO TABS
3.0000 mg | ORAL_TABLET | Freq: Every evening | ORAL | Status: DC | PRN
Start: 2023-11-27 — End: 2023-11-30
  Administered 2023-11-27: 3 mg via ORAL
  Filled 2023-11-26: qty 1

## 2023-11-26 MED ORDER — DEXTROSE 10 % IV BOLUS
12.5000 g | INTRAVENOUS | Status: DC | PRN
Start: 2023-11-26 — End: 2023-11-30

## 2023-11-26 MED ORDER — ASPIRIN 81 MG PO CHEW
81.0000 mg | CHEWABLE_TABLET | Freq: Every day | ORAL | Status: DC
Start: 2023-11-27 — End: 2023-11-29
  Administered 2023-11-28 – 2023-11-29 (×2): 81 mg via ORAL
  Filled 2023-11-26 (×2): qty 1

## 2023-11-26 MED ORDER — DEXTROSE 50 % IV SOLN
12.5000 g | INTRAVENOUS | Status: DC | PRN
Start: 2023-11-26 — End: 2023-11-26

## 2023-11-26 MED ORDER — ACETAMINOPHEN 325 MG PO TABS
325.0000 mg | ORAL_TABLET | ORAL | Status: DC | PRN
Start: 2023-11-26 — End: 2023-11-30

## 2023-11-26 MED ORDER — NALOXONE HCL 0.4 MG/ML IJ SOLN (WRAP)
0.2000 mg | INTRAMUSCULAR | Status: DC | PRN
Start: 2023-11-26 — End: 2023-11-30

## 2023-11-26 MED ORDER — LABETALOL HCL 5 MG/ML IV SOLN (WRAP)
10.0000 mg | Freq: Once | INTRAVENOUS | Status: DC | PRN
Start: 2023-11-26 — End: 2023-11-26
  Filled 2023-11-26: qty 4

## 2023-11-26 MED ORDER — ASPIRIN 300 MG RE SUPP
300.0000 mg | Freq: Every day | RECTAL | Status: DC
Start: 2023-11-27 — End: 2023-11-28
  Administered 2023-11-27: 300 mg via RECTAL
  Filled 2023-11-26: qty 1

## 2023-11-26 MED ORDER — RANOLAZINE ER 500 MG PO TB12
500.0000 mg | ORAL_TABLET | Freq: Two times a day (BID) | ORAL | Status: DC
Start: 2023-11-26 — End: 2023-11-30
  Administered 2023-11-27 – 2023-11-30 (×6): 500 mg via ORAL
  Filled 2023-11-26 (×6): qty 1

## 2023-11-26 MED ORDER — ACETAMINOPHEN 650 MG RE SUPP
650.0000 mg | Freq: Four times a day (QID) | RECTAL | Status: DC | PRN
Start: 2023-11-26 — End: 2023-11-30

## 2023-11-26 MED ORDER — GLUCAGON 1 MG IJ SOLR (WRAP)
1.0000 mg | INTRAMUSCULAR | Status: DC | PRN
Start: 2023-11-26 — End: 2023-11-26

## 2023-11-26 MED ORDER — GLUCOSE 40 % PO GEL (WRAP)
15.0000 g | ORAL | Status: DC | PRN
Start: 2023-11-26 — End: 2023-11-26

## 2023-11-26 MED ORDER — POTASSIUM CHLORIDE 20 MEQ PO PACK
0.0000 meq | PACK | ORAL | Status: DC | PRN
Start: 2023-11-26 — End: 2023-11-30

## 2023-11-26 MED ORDER — HYDRALAZINE HCL 20 MG/ML IJ SOLN
10.0000 mg | INTRAMUSCULAR | Status: DC | PRN
Start: 2023-11-26 — End: 2023-11-28

## 2023-11-26 MED ORDER — IOHEXOL 350 MG/ML IV SOLN
100.0000 mL | Freq: Once | INTRAVENOUS | Status: AC | PRN
Start: 2023-11-26 — End: 2023-11-26
  Administered 2023-11-26: 100 mL via INTRAVENOUS
  Filled 2023-11-26: qty 100

## 2023-11-26 MED ORDER — POTASSIUM & SODIUM PHOSPHATES 280-160-250 MG PO PACK
2.0000 | PACK | ORAL | Status: DC | PRN
Start: 2023-11-26 — End: 2023-11-30

## 2023-11-26 MED ORDER — INSULIN LISPRO 100 UNIT/ML SOLN (WRAP)
1.0000 [IU] | Freq: Three times a day (TID) | Status: DC
Start: 2023-11-26 — End: 2023-11-30
  Administered 2023-11-27: 1 [IU] via SUBCUTANEOUS
  Administered 2023-11-28: 2 [IU] via SUBCUTANEOUS
  Administered 2023-11-30: 1 [IU] via SUBCUTANEOUS
  Filled 2023-11-26: qty 9
  Filled 2023-11-26 (×2): qty 3

## 2023-11-26 MED ORDER — CARBOXYMETHYLCELLULOSE SOD PF 0.5 % OP SOLN
1.0000 [drp] | Freq: Three times a day (TID) | OPHTHALMIC | Status: DC | PRN
Start: 2023-11-26 — End: 2023-11-30

## 2023-11-26 NOTE — H&P (Addendum)
 ATTENDING NOTE:  I have reviewed the history, imaging and pertinent test results and I performed the substantiative portion of this visit by providing more than 50% of the total time.  I discussed case with APP.  Patient was also seen by the APP Aldine Prophet, MD   I reviewed the APP note and updated the documented findings and formulated plan of care accordingly.        Active Hospital Problems    Diagnosis    Altered mental status, unspecified       Vanessa Patricia, FNP   ADMISSION HISTORY AND PHYSICAL EXAM    [facility], DIVISION OF HOSPITALIST MEDICINE   [place] [facility]   [place] Pager: [phone]      Date Time: 11/26/23 4:18 PM  Patient Name: Vanessa Kelly  Attending Physician: Nawal, Saadia, MD  Primary Care Physician: Aneita Dorothyann BRAVO, NP    CC: I do not know what happened, daughter on morning call done daily noticed that patient was nonsensical appeared altered 911 was called.  History Gathered From: Adult Child and some with patient    Active Hospital Problems    Diagnosis    Altered mental status, unspecified       Patient has a BMI of 27.57 kg/m2    Overweight: BMI of 25 to 29.9           Recent Labs     11/26/23  1232   Sodium 130*     Diagnosis: Mild Hyponatremia  Recent Labs     11/26/23  1232   Potassium 5.4*     Diagnosis: Hyperkalemia    Recent Labs   Lab 11/26/23  1232   Hemoglobin 9.0*   Hematocrit 27.0*   MCV 98.9*   WBC 7.31   Platelet Count 202     Recent Labs   Lab 10/15/23  0606   Ferritin 44.40   Iron 126   TIBC 269   Iron Saturation 47   Folate 14.9     Anemia Diagnosis: Acute: Acute Anemia, Unspecified on Chronic: Anemia of Chronic Disease and Anemia of Chronic Kidney Disease  Cr Baseline Estimation (minimum in last 3 months): 1.2 mg/dL  Maximum Cr in last 36 hours: 1.5 mg/dL    Recent Labs (Last 3 Months)     11/26/23  1232 11/14/23  1348 10/20/23  0415   Creatinine 1.5* 1.3* 1.4*   BUN 23* 22* 20       Acute Kidney Injury Diagnosis: Acute kidney injury,  present on admission     Assessment and Plan :   SHAUNIKA ITALIANO is a 83 y.o. female with a  diabetes, GERD, hyperlipidemia, hypertension, hypotension, insomnia, orthostatic hypotension, coronary artery disease CABG x 4, hyperlipidemia, PVD, muscle weakness, cerebrovascular disease, asthma, parkinsonism, CKD, fracture of medial wall of orbit June 2025, frequent falls.  Recently hospitalized after fall orbital fracture and was discharged to rehab facility has been home 1 week.     -Admit to [employer] service    Arrives to ER with altered mental status last known normal 1030pm per ED note upon arrival was able to answer questions EMS reported right facial droop patient also with complaint of dizziness headache.  In the ED, CT head no CVA, CTA noted chronic right ICA occlusion..  Troponin x 1 negative, blood glucose on arrival 111.  Urinalysis no leukocyte no WBC no nitrite.   Patient evaluated at bedside daughter Vanessa Kelly is also at bedside.  Daughter reports that  she speaks to patient in the evening via FaceTime and yesterday evening patient was her usual.  Daughter did noticed that patient was using a walker and was advised to only use wheelchair and bedside commode if needed.  Today  speaking to patient at 1030 she reported that patient was making nonsensical statements.  Did not seem herself daughter was concerned that maybe this was a UTI as she has had prior and appeared this way.  Last known patient norm was yesterday at 10:30 PM.              #Chronically occluded right ICA  #Altered mental status  #Facial droop  #Dysarthria  #Dizziness  #Dysphagia  #Chronically occluded right ICA incidental find on CTA head/neck   -Per family member no known history of stroke  -Admitted for rule out CVA, TIA admission protocol, telemetry, every 4 hours neurocheck  -Neurology consulted  -CT head parenchymal mild volume loss and chronic small vessel acute ischemic change  -7/27 CTA on admission mildly delayed transit right  cerebral hemisphere secondary to chronically occluded right ICA, intracranial atherosclerosis without intracranial large vessel occlusion, 25% stenosis of the proximal left internal carotid. Consider inpatient vascular evaluation given chronically occluded R ICA if MRI shows signs of stroke or concern for TIA  -MRI brain also MRA head and neck ordered without contrast given AKI  -PT OT swallow eval, patient did not pass bedside nurse swallow eval will need to have formal swallow eval tomorrow.  On exam with significant dry oral mucosa likely secondary to scopolamine  use.   Neurology consulted  Follow TIA protocol, permissive blood pressure  Statin, aspirin   Start mIVF         #AKI on CKD 3A  # Hyponatremia  # Hyperkalemia  Repeat BMP now  Patient self-reported diarrhea past 2 days.  Baseline creatinine 1.1  10/2023 B12 500 folate 14 ferritin 44 iron saturation 47  Will check BNP also adrenal insufficiency workup underway.       #Excessive sleepiness,chronic  Possible adrenal insufficiency  #Frequent falls-recent orbital fracture 10/2023  With hyperkalemia, hyponatremia, elevated BUN/creatinine, mild elevated calcium   Daughter reports patient excessively sleepy for some time now generalized weakness known orthostatic hypotension also hypoglycemic ,depression recently patient reporting diarrhea past 2 days  On exam patient with inability to open eyes on exam with mild pressure on eyelids  Additionally given known orthostatic hypotension labile blood sugars  Add on cortisol random if abnormal given time of day will order cosyntropin  test for a.m.  ACTH  added  Renin angiotensin for a.m. lab     # Hypertensive urgency on admission in setting of known hypertension  #CAD -CABG (2002), stents (2006)   #Cardiomegaly (7/27 CXR with mild pulm edema)  #HLD  Family member reports patient's blood pressure is labile  Chest x-ray on admission 7/27 cardiomegaly mild pulmonary edemaEcho 10/15/2023 EF 69%, mild concentric left  ventricular remodeling, left ventricular diastolic function indeterminate, mild aortic valve sclerotic 5 Brohl calcific changes mild aortic regurgitation trace to mild tricuspid regurg. Compared to 12/2022 similar   At home uses candesartan  4 mg as needed or midodrine  if BP low  Lovastatin 40 mg a day will hold for now follow blood pressure closely once MRI brain MRI head neck without acute find and neurology evaluated patient would resume.  Midodrine  as needed for hypotension and meclizine   Ranolazine (Ranexa ) ER 500 every 12  Can allow permissive hypertension for 24 hours if MRI findings show stroke  Nt bnp AM  Echo  for any interval changes as CXR with mild pulm edema     #Orthostatic hypotension  Use of home meds midodrine  as needed for hypotension and meclizine   Recently prescribed scopolamine  on 7/18 1 patch every 3 days would have used 3-4 patches     # DM II  Per daughter at bedside blood sugars are labile  Daughter at bedside reports that patient uses insulin  as needed.  Depending on blood sugars  A1c  SSI resume     #GERD  Protonix  40 mgPO/IV ordered     Osteoarthritis  S/p right TKA  -Continue home PRN lidocaine  patches     #Depression   Effexor  resume once patient passes swallow eval        VTE Prophylaxis-Medication VTE Prophylaxis Orders: enoxaparin  (LOVENOX ) syringe 40 mg  Mechanical VTE Prophylaxis Orders: Maintain sequential compression device  Nutrition:Diet NPO effective now Except for: OTHER (SEE COMMENTS) (N.p.o. until cleared by SLP)  Supervise For Meals Frequency: All meals      Code status: DNR/DNI- per patient's daughter she has DNR in place    Status/Disposition:   Pt is admitted under INPATIENT with above concerns.    Anticipated medical stability for discharge: I expect an inpatient to remain in the hospital for more than 2 midnights due to R/O TIA      History of Presenting Illnes   YARELIS AMBROSINO is a 83 y.o. female Arrives to ER with altered mental status last known normal 1030pm per  ED note upon arrival was able to answer questions EMS reported right facial droop patient also with complaint of dizziness headache.  In the ED, CT head no CVA, CTA noted chronic right ICA occlusion..  Troponin x 1 negative, blood glucose on arrival 111.  Urinalysis no leukocyte no WBC no nitrite.   Patient evaluated at bedside daughter Vanessa Kelly is also at bedside.  Daughter reports that she speaks to patient in the evening via FaceTime and yesterday evening patient was her usual.  Daughter did noticed that patient was using a walker and was advised to only use wheelchair and bedside commode if needed.  Today  speaking to patient at 1030 she reported that patient was making nonsensical statements.  Did not seem herself daughter was concerned that maybe this was a UTI as she has had prior and appeared this way.  Last known patient norm was yesterday at 10:30 PM.    ROS  The patient denies any fever, chills, headache, numbness, tingling,  vision changes, hearing changes, chest pain, palpitations, shortness of breath, cough, abdominal pain, nausea, vomiting, constipation, melena, BRBPR, dysuria, frequency, hematuria, or rash.     Reports 2 days diarrhea chronic dizziness, chronic generalized weakness,  Chronic sleepiness.  Patient states she does not remember what occurred after she spoke to her daughter yesterday evening.   She was able to state the year and location and why she was in the hospital now    She is Aox3 but intermittently confused still on my evaluation- sometimes states [place] a [place]. Endorses increased weakness. 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      Family History:   family history includes Breast cancer in her sister; Heart failure in her mother; Myocardial Infarction in her father.    Social History:    reports that she quit smoking about 35 years ago. Her smoking use included cigarettes. She has been exposed to tobacco smoke. She has never used smokeless tobacco. She reports that she does not currently use alcohol. She reports that she does not use drugs.    Allergies:   Allergies[1]    Medications:     Home Medications       Med List Status: In Progress Set By: Lendell Cower, RN at 11/26/2023 12:48 PM              acetaminophen  (TYLENOL ) 325 MG tablet     Take 2 tablets (650 mg) by mouth every 6 (six) hours as needed for Pain (or headache)     albuterol  sulfate HFA (PROVENTIL ) 108 (90 Base) MCG/ACT inhaler          Patient not taking: No sig reported     aspirin  EC 81 MG EC tablet     Take 1 tablet (81 mg) by mouth daily     calcium  carbonate (TUMS) 500 MG chewable tablet     Chew 1 tablet (500 mg) by mouth every 6 (six) hours as needed for Heartburn     candesartan  (ATACAND ) 4 MG tablet     Take 1 tablet (4 mg) by mouth once daily as needed (SBP > 160) Take 1 tablet (4mg ) once daily as needed ( SBP more than 170 )     ergocalciferol  (ERGOCALCIFEROL ) 1.25 MG (50000 UT) capsule     Take 1 capsule (50,000 Units) by mouth once a week On Saturday     fexofenadine (ALLEGRA) 180 MG tablet     Take 1 tablet (180 mg) by mouth daily     fluticasone  (Flonase  Sensimist) 27.5 MCG/SPRAY nasal spray     1 spray by Nasal route daily     INSULIN  LISPRO, 1 UNIT DIAL, SC     Inject into the skin     lidocaine  (LIDODERM ) 5 %     Place 1 patch onto the skin in the morning. Remove & Discard patch within 12 hours or as directed by MD.     lidocaine  (LIDODERM ) 5 %     Place 1 patch onto the skin every 24 hours Remove & Discard patch within 12  hours or as directed by MD     lovastatin (MEVACOR) 40 MG tablet     Take 1 tablet (40 mg) by mouth nightly     meclizine  (ANTIVERT ) 12.5 MG tablet     Take 2 tablets (25 mg) by mouth 3 (three) times daily as needed for Dizziness     midodrine  (PROAMATINE ) 2.5 MG tablet     Take 1 tablet (2.5 mg) by mouth every morning before breakfast     midodrine  (PROAMATINE ) 2.5 MG tablet     Take 1 tablet (2.5 mg) by mouth once daily as needed (SBP less than 120)     Multiple Vitamins-Minerals (Centrum Silver 50+Women) Tab     Take 1 tablet by mouth daily     ondansetron  (ZOFRAN -ODT) 4 MG disintegrating tablet     Take 1 tablet (  4 mg) by mouth every 6 (six) hours as needed for Nausea     pantoprazole  (PROTONIX ) 40 MG tablet     Take 1 tablet (40 mg) by mouth nightly     polyethylene glycol (MIRALAX ) 17 g packet     Take 17 g by mouth daily     ranolazine  (RANEXA ) 500 MG 12 hr tablet     Take 1 tablet (500 mg) by mouth 2 (two) times daily     traMADol  (ULTRAM ) 50 MG tablet     Take 1 tablet (50 mg) by mouth every 8 (eight) hours as needed for Pain     venlafaxine  (EFFEXOR -XR) 37.5 MG 24 hr capsule     Take 2 capsules (75 mg) by mouth nightly                Reviewed Medications from Dispense report: [x] YES / [] NO   Confirmed Medications with patient/family:     [] YES / [] NO     Daughter unsure of patient's med list so could not confirm with family   Review of Systems:   All other systems were reviewed and are negative except:as above in HPI     Physical Exam:   Patient Vitals for the past 24 hrs:   BP Temp Temp src Pulse Resp SpO2 Height Weight   11/26/23 1527 179/72 97.7 F (36.5 C) Oral (!) 56 17 99 % 1.702 m (5' 7) 79.8 kg (176 lb)   11/26/23 1331 189/80 -- -- 63 19 100 % -- --   11/26/23 1311 (!) 217/83 -- -- 62 (!) 24 100 % -- --   11/26/23 1259 (!) 222/95 -- -- 67 (!) 23 100 % -- --   11/26/23 1255 -- -- -- 68 (!) 23 97 % -- --   11/26/23 1253 147/74 -- -- -- -- -- -- --   11/26/23 1250 -- -- -- 74 (!) 37 100 % -- --    11/26/23 1245 198/82 -- -- 61 19 100 % -- --   11/26/23 1242 (!) 205/80 -- -- 69 21 100 % -- --   11/26/23 1240 -- -- -- 64 21 100 % -- --   11/26/23 1235 (!) 208/84 -- -- 67 21 100 % -- --   11/26/23 1230 190/76 -- -- 67 19 100 % -- --   11/26/23 1135 172/67 97.6 F (36.4 C) Oral (!) 53 20 94 % -- 83.8 kg (184 lb 11.9 oz)     Body mass index is 27.57 kg/m.  No intake or output data in the 24 hours ending 11/26/23 1618    General: awake, alert, oriented x 3; no acute distress  HEENT: Slight facial droop right side anicteric sclerae, PERRL, EOMI; MMM  Cardiovascular: regular rate and rhythm; no murmurs, rubs, or gallops  Lungs: clear to auscultation bilaterally without wheezing, rhonchi, or rales  Abdomen: soft, non-distended; non-tender to palpation, no rebound or guarding  Extremities: warm; no LE edema; no clubbing or cyanosis  Neuro: symmetric facial movements, clear speech, moving all extremities  Unable to open eyelids on slight pressure to eyelid        Labs:     Results for orders placed or performed during the hospital encounter of 11/26/23 (from the past 24 hours)    Collection Time: 11/26/23 11:39 AM   Result Value    Whole Blood Glucose POCT 111 (H)   Comprehensive Metabolic Panel    Collection Time: 11/26/23 12:32 PM   Result Value  Glucose 84    BUN 23 (H)    Creatinine 1.5 (H)    Sodium 130 (L)    Potassium 5.4 (H)    Chloride 101    CO2 24    Calcium  9.5    Anion Gap 5.0    GFR 33.5 (L)    AST (SGOT) 25    ALT 32    Alkaline Phosphatase 83    Albumin 3.2 (L)    Protein, Total 6.4    Globulin 3.2    Albumin/Globulin Ratio 1.0    Bilirubin, Total 0.3   PT/INR    Collection Time: 11/26/23 12:32 PM   Result Value    PT 11.8    INR 1.0   APTT    Collection Time: 11/26/23 12:32 PM   Result Value    PTT 26 (L)   High Sensitivity Troponin-I    Collection Time: 11/26/23 12:32 PM   Result Value    hs Troponin 11.3   CBC with Differential (Component)    Collection Time: 11/26/23 12:32 PM   Result Value     WBC 7.31    Hemoglobin 9.0 (L)    Hematocrit 27.0 (L)    Platelet Count 202    MPV 9.6    RBC 2.73 (L)    MCV 98.9 (H)    MCH 33.0    MCHC 33.3    RDW 13    nRBC % 0.0    Absolute nRBC 0.00    Preliminary Absolute Neutrophil Count 5.22    Neutrophils % 71.3    Lymphocytes % 17.8    Monocytes % 7.7    Eosinophils % 2.2    Basophils % 0.7    Immature Granulocytes % 0.3    Absolute Neutrophils 5.22    Absolute Lymphocytes 1.30    Absolute Monocytes 0.56    Absolute Eosinophils 0.16    Absolute Basophils 0.05    Absolute Immature Granulocytes 0.02   Urinalysis with Reflex to Microscopic Exam and Culture    Collection Time: 11/26/23  2:03 PM    Specimen: Urine, Clean Catch   Result Value    Urine Color Straw    Urine Clarity Clear    Urine Specific Gravity 1.021    Urine pH 6.5    Urine Leukocyte Esterase Negative    Urine Nitrite Negative    Urine Protein Negative    Urine Glucose Negative    Urine Ketones Negative    Urine Urobilinogen Normal    Urine Bilirubin Negative    Urine Blood Negative        EKG:     Imaging personally reviewed, including: all available   Chest  AP Portable  Result Date: 11/26/2023   Cardiomegaly with mild pulmonary edema pattern. [name], MD 11/26/2023 1:42 PM    CTA  Head & Neck  Result Date: 11/26/2023  1.No perfusion deficit suggestive of ischemic penumbra or infarct core. 2.Mildly delayed transit right cerebral hemisphere secondary to chronically occluded right ICA.  3.There is 25% stenosis of the proximal left internal carotid artery based on NASCET criteria. 4.The vertebrobasilar arterial system is patent. 5.Intracranial atherosclerosis without intracranial large vessel occlusion. [name], MD 11/26/2023 12:43 PM    CT Perfusion Brain  Result Date: 11/26/2023  1.No perfusion deficit suggestive of ischemic penumbra or infarct core. 2.Mildly delayed transit right cerebral hemisphere secondary to chronically occluded right ICA.  3.There is 25% stenosis of the proximal left  internal carotid artery based  on NASCET criteria. 4.The vertebrobasilar arterial system is patent. 5.Intracranial atherosclerosis without intracranial large vessel occlusion. [name], MD 11/26/2023 12:43 PM    CT Head WO Contrast  Result Date: 11/26/2023   1.No intracranial hemorrhage or acute intracranial findings. 2.Parenchymal volume loss and chronic small vessel changes. [name], MD 11/26/2023 11:58 AM          Please pardon any potential grammatic errors or typos as aspects of this note may have been created through speech to text software    Signed by: Klara Stjames Cukon-Lyons, FNP,NAWAL, SAADIA  MD   cc:Diakiwsky, Catherine E, NP         [1]   Allergies  Allergen Reactions    Fentanyl Anaphylaxis     Hives    Percocet [Oxycodone-Acetaminophen ]     Percolone [Oxycodone]

## 2023-11-26 NOTE — ED Notes (Signed)
 1240: Per Dr. Nivia, hold off on tenecteplase  due to clarification from patient's daughter. Per patient's daughter Vanessa Kelly. Patient and patient's daughter talked this morning at 1030 via video call however daughter states that I noticed the symptoms immediate when she answered the phone and I believe the last time I spoke to her prior to this morning was at 7pm-8pm yesterday night [11/27/2023].

## 2023-11-26 NOTE — ED Notes (Signed)
 1237: Dr. Nivia discussing with daughter Bernice about tenecteplase  administration

## 2023-11-26 NOTE — ED Notes (Signed)
 1249: Dr. Nivia at bedside, per neurologist, tenecteplase  is contraindicated

## 2023-11-26 NOTE — EDIE (Signed)
 [facility] NOTIFICATION 11/26/2023 05:36 SAFIYYA, STOKES DOB: Jul 15, 1940 MRN: [id number]    [facility]'s patient encounter information:   FMW:[id number]  Account [id number]  Billing Account [id number]      Criteria Met      5 ED Visits in 12 Months    Security and Safety  No Security Events were found.  ED Care Guidelines  There are currently no ED Care Guidelines for this patient. Please check your facility's medical records system.        Prescription Monitoring Program  Narx Score not available at this time.    E.D. Visit Count (12 mo.)  Facility Visits   [place] [facility] 6   [facility] 1   Total 7   Note: Visits indicate total known visits.     Recent Emergency Department Visit Summary  Date Facility [facility] Type Diagnoses or Chief Complaint    Nov 26, 2023  [place] [facility]  [place]  [place]  Emergency      medic      Nov 14, 2023  [place] [facility]  [place]  [place]  Emergency      Dizziness and giddiness      Dizziness      Headache      Medic: dizziness      Oct 14, 2023  [place] [facility] [place] [facility]  [place]  [address]  Emergency      Essential (primary) hypertension      Fracture of orbit, unspecified, initial encounter for closed fracture      Laceration without foreign body of other part of head, initial encounter      Unspecified injury of head, initial encounter      Hip Pain      Head Injury      Laceration      fall      Jul 15, 2023  [place] [facility] [place] [facility]  [place]  [place]  Emergency      Altered mental status, unspecified      Altered Mental Status      Slurred Speech      Extremity Weakness      Urinary retention      Jun 21, 2023  [place] [facility]  [place]  [place]  Emergency      Noninfective gastroenteritis and colitis, unspecified      Emesis      Diarrhea      Medic      Jun 21, 2023  [place] [facility] [place] [facility]  [place]  [place]  Emergency      Pain in right shoulder      Noninfective gastroenteritis and colitis, unspecified      Arm Pain       Generalized weakness      Medic      Dec 04, 2022  [place] [facility]  [place]    Emergency      Dizziness and giddiness      Fall      Syncope      medic        Recent Inpatient Visit Summary  Date Facility [facility] Type Diagnoses or Chief Complaint    Oct 14, 2023  [place] [facility] [place] [facility]  [place]    [facility] Surgical      Fracture of orbit, unspecified, initial encounter for closed fracture      Unspecified injury of head, initial  encounter      Laceration without foreign body of other part of head, initial encounter      Essential (primary) hypertension      Aug 17, 2023  [place] [facility] [facility]  [place]  [place]  Medical Surgical      Tremor, unspecified      Essential (primary) hypertension      Jul 15, 2023  [place] [facility] [place] [facility]  [place]  [place]  Medical Surgical      Acute cough      Wheezing      Acute cystitis without hematuria      Altered mental status, unspecified        Care Team  Provider Specialty Phone Fax Service Dates   CARON QUITTER [phone] Nurse Practitioner [phone] Current    BEHIRI, AMR , DO Internal Medicine   Current    Beheshtin, Darya K., MD Family Medicine   Current    DE LAS ALAS, NICOLE, MSN, FNP-BC Nurse Practitioner: Family [phone]  Current    CINDA ANES MD, M.D. Internal Medicine: Infectious Disease [phone]  Current    DIAKIWSKY, CATHERINE, E, (FNP-BC) Nurse Practitioner: Family [phone] Current    KAY, ARAS F, M.D. Internal Medicine   Current    ETHA CHARLEEN HERO, NP Nurse Practitioner: Adult Health   Current    Albina Sender, M.D. Internal Medicine   Current    GOLDA BAR MD, MD Internal Medicine: Nephrology [phone]  Current    Monette Hollering, MD Hospitalist [phone]  Current    CLAUDENE JANUS BIRCH DPM, DPM Podiatrist [phone]  Current      [facility]  This patient has registered at the [facility] Emergency Department  For more information visit:  [email or web address]     PLEASE NOTE:     1.   Any care recommendations and other clinical information are provided as guidelines or for historical purposes only, and providers should exercise their own clinical judgment when providing care.    2.   You may only use this information for purposes of treatment, payment or health care operations activities, and subject to the limitations of applicable [facility] Policies.    3.   You should consult directly with the organization that provided a care guideline or other clinical history with any questions about additional information or accuracy or completeness of information provided.    ? 2025 [facility] - [email or web address]

## 2023-11-26 NOTE — H&P (Incomplete)
 [facility]  Internal Medicine Hospitalists  New Consult Note          Requesting Physician: Aldine Prophet, MD  Reason for Consultation: ***    Assessment / Recommendations:        Vanessa Kelly is a 83 y.o. female with diabetes, GERD, hyperlipidemia, hypertension, hypotension, insomnia, orthostatic hypotension, coronary artery disease CABG x 4, hyperlipidemia, PVD, muscle weakness, cerebrovascular disease, asthma, parkinsonism, CKD, fracture of medial wall of orbit June 2025  Arrives to ER with altered mental status last known normal 1030pm per ED note upon arrival was able to answer questions EMS reported right facial droop patient also with complaint of dizziness headache.  In the ED, CT head no CVA, CTA noted chronic right ICA occlusion..  Troponin x 1 negative, blood glucose on arrival 111.  Urinalysis no leukocyte no WBC no nitrite.   Patient evaluated at bedside daughter [name] is also at bedside.  Daughter reports that she speaks to patient in the evening via FaceTime and yesterday evening patient was her usual.  Daughter did noticed that patient was using a walker and was advised to only use wheelchair and bedside commode if needed.  Today  speaking to patient at 1030 she reported that patient was making nonsensical statements.  Did not seem herself daughter was concerned that maybe this was a UTI as she has had prior and appeared this way.  Last known patient norm was yesterday at 10:30 PM.          #Chronically occluded right ICA  #Altered mental status  #Facial droop  #Dysarthria  #Dizziness  #Dysphagia  #Chronically occluded right ICA incidental find on CTA head/neck   -Per family member no known history of stroke  -Admitted for rule out CVA, TIA admission protocol, telemetry, every 4 hours neurocheck  -Neurology consulted  -CT head parenchymal mild volume loss and chronic small vessel acute ischemic change  -7/27 CTA on admission mildly delayed transit right cerebral hemisphere  secondary to chronically occluded right ICA, intracranial atherosclerosis without intracranial large vessel occlusion, 25% stenosis of the proximal left internal carotid.  -MRI brain also MRA head and neck ordered without contrast given AKI  -PT OT swallow eval, patient did not pass bedside nurse swallow eval will need to have formal swallow eval tomorrow.  On exam with significant dry oral mucosa likely secondary to scopolamine  use.   Neurology consulted  Follow TIA protocol, permissive blood pressure  Statin, aspirin       #AKI on CKD 3A  # Hyponatremia  # Hyperkalemia  Repeat BMP now  Patient self-reported diarrhea past 2 days.  Baseline creatinine 1.1      #Anemia possibly due to chronic disease and CKD    #Excessive sleepiness,chronic  Possible adrenal insufficiency  With hyperkalemia, hyponatremia, elevated BUN/creatinine, mild elevated calcium   Daughter reports patient excessively sleepy for some time now generalized weakness known orthostatic hypotension also hypoglycemic ,depression recently patient reporting diarrhea past 2 days  On exam patient with inability to open eyes on exam with mild pressure on eyelids  Additionally given known orthostatic hypotension labile blood sugars  Add on cortisol random if abnormal given time of day will order cosyntropin  test for a.m.  ACTH  added  Renin angiotensin for a.m. lab    # Hypertensive urgency on admission in setting of known hypertension  #CAD -CABG (2002), stents (2006)   #HLD  Family member reports patient's blood pressure is labile  Chest x-ray on admission 7/27 cardiomegaly mild pulmonary  edema  Echo 10/15/2023 EF 69%, mild concentric left ventricular remodeling, left ventricular diastolic function indeterminate, mild aortic valve sclerotic 5 Brohl calcific changes mild aortic regurgitation trace to mild tricuspid regurg. Compared to 12/2022 similar   At home uses candesartan  4 mg as needed or midodrine  if BP low  Lovastatin 40 mg a day will hold for now  follow blood pressure closely once MRI brain MRI head neck without acute find and neurology evaluated patient would resume.  Midodrine  as needed for hypotension and meclizine   Ranolazine (Ranexa ) ER 500 every 12      #Orthostatic hypotension  Use of home meds midodrine  as needed for hypotension and meclizine   Recently prescribed scopolamine  on 7/18 1 patch every 3 days would have used 3-4 patches    # DM II  Per daughter at bedside blood sugars are labile  Daughter at bedside reports that patient uses insulin  as needed.  Depending on blood sugars  A1c  SSI resume    #GERD  Protonix  40 mgPO/IV ordered    Osteoarthritis  S/p right TKA  -Continue home PRN lidocaine  patches    #Depression   Effexor  resume once patient passes swallow eval       {Include Preoperative Evaluation (Optional):[id number]}    Open Handoff Activity in Sidebar    Additional Diagnoses:   {Disappearing Text  Patient meets BMI criteria for a weight-based diagnosis. Patient has a high BMI of 27.57.   Overweight = BMI 25-29.9 kg/m2  Class 1 Obesity = BMI 30-34.9 kg/m2  Class 2 Obesity = BMI 35-39.9 kg/m2  Class 3 Obesity = BMI 40 or above kg/m2 or 35 and above with hypertension or diabetes mellitus  reference:[id number]}         {Vanishing Tip  Sodium value is low  130-134 mEq/L = Mild Hyponatremia  120-129 mEq/L = Moderate Hyponatremia  <120 mEq/L = Severe Hyponatremia :[id number]}  Recent Labs     11/26/23  1232   Sodium 130*     Diagnosis: Mild Hyponatremia{Disappearing Text  Potassium value is high  >5.3 mEq/L = Hyperkalemia :[id number]}  Recent Labs     11/26/23  1232   Potassium 5.4*     Diagnosis: Hyperkalemia  {Disappearing Text  Hgb level is below the normal lab reference range.  Female Hgb <11.3 is abnormal  Female      Hgb <12.5 is abnormal. :[id number]}  Recent Labs   Lab 11/26/23  1232   Hemoglobin 9.0*   Hematocrit 27.0*   MCV 98.9*   WBC 7.31   Platelet Count 202     Recent Labs   Lab 10/15/23  0606   Ferritin 44.40   Iron 126   TIBC 269   Iron  Saturation 47   Folate 14.9     Anemia Diagnosis: Acute: Acute Anemia, Unspecified on Chronic: Anemia of Chronic Disease and Anemia of Chronic Kidney Disease{Disappearing Text  Patient may meet criteria for acute kidney injury based on KDIGO Guidelines below  Increase in Cr by >=0.3 mg/dL within 48 hours   Increase in Cr to >=1.5 times baseline or more within the last 7 days  Urine output of < 0.5 mL/kg/h for 6 hours  :[id number]}  Cr Baseline Estimation (minimum in last 3 months): 1.2 mg/dL  Maximum Cr in last 36 hours: 1.5 mg/dL    Recent Labs (Last 3 Months)     11/26/23  1232 11/14/23  1348 10/20/23  0415   Creatinine 1.5* 1.3* 1.4*   BUN 23* 22* 20  Acute Kidney Injury Diagnosis: Acute kidney injury, present on admission                 Subjective:      Around how I feel fine.  I did not tell you was having diarrhea-patient speaking to daughter    D/w daughter Bernice at bedside      Past Medical / Surgical / Family / Social History:   {Disappearing Text  History Review Section      Please update history sections using links below and then refresh note.   Past Medical History  Surgical History  Family History  Social History              :55325}  Past Medical History:   Diagnosis Date    Diabetes mellitus (CMS/HCC)     Gastroesophageal reflux disease     Hyperlipidemia     Hypertension     Hypotension     Malignant tumor of breast (CMS/HCC) 04/13/2016    Seasonal allergic rhinitis     Syncope      Past Surgical History[1]   Family History[2]  Social History[3]    Allergies and Home Medications:   {Disappearing Text  Allergy and Medication Review Section    Please update history sections using links below and then refresh note.   Allergies  Medications  Immunizations                  :55325}  Allergies[4]  Home Medications       Med List Status: In Progress Set By: Lendell Cower, RN at 11/26/2023 12:48 PM              acetaminophen  (TYLENOL ) 325 MG tablet     Take 2 tablets (650 mg) by mouth every 6  (six) hours as needed for Pain (or headache)     albuterol  sulfate HFA (PROVENTIL ) 108 (90 Base) MCG/ACT inhaler          Patient not taking: No sig reported     aspirin  EC 81 MG EC tablet     Take 1 tablet (81 mg) by mouth daily     calcium  carbonate (TUMS) 500 MG chewable tablet     Chew 1 tablet (500 mg) by mouth every 6 (six) hours as needed for Heartburn     candesartan  (ATACAND ) 4 MG tablet     Take 1 tablet (4 mg) by mouth once daily as needed (SBP > 160) Take 1 tablet (4mg ) once daily as needed ( SBP more than 170 )     ergocalciferol  (ERGOCALCIFEROL ) 1.25 MG (50000 UT) capsule     Take 1 capsule (50,000 Units) by mouth once a week On Saturday     fexofenadine (ALLEGRA) 180 MG tablet     Take 1 tablet (180 mg) by mouth daily     fluticasone  (Flonase  Sensimist) 27.5 MCG/SPRAY nasal spray     1 spray by Nasal route daily     INSULIN  LISPRO, 1 UNIT DIAL, SC     Inject into the skin     lidocaine  (LIDODERM ) 5 %     Place 1 patch onto the skin in the morning. Remove & Discard patch within 12 hours or as directed by MD.     lidocaine  (LIDODERM ) 5 %     Place 1 patch onto the skin every 24 hours Remove & Discard patch within 12 hours or as directed by MD     lovastatin (MEVACOR) 40 MG tablet  Take 1 tablet (40 mg) by mouth nightly     meclizine  (ANTIVERT ) 12.5 MG tablet     Take 2 tablets (25 mg) by mouth 3 (three) times daily as needed for Dizziness     midodrine  (PROAMATINE ) 2.5 MG tablet     Take 1 tablet (2.5 mg) by mouth every morning before breakfast     midodrine  (PROAMATINE ) 2.5 MG tablet     Take 1 tablet (2.5 mg) by mouth once daily as needed (SBP less than 120)     Multiple Vitamins-Minerals (Centrum Silver 50+Women) Tab     Take 1 tablet by mouth daily     ondansetron  (ZOFRAN -ODT) 4 MG disintegrating tablet     Take 1 tablet (4 mg) by mouth every 6 (six) hours as needed for Nausea     pantoprazole  (PROTONIX ) 40 MG tablet     Take 1 tablet (40 mg) by mouth nightly     polyethylene glycol (MIRALAX ) 17  g packet     Take 17 g by mouth daily     ranolazine  (RANEXA ) 500 MG 12 hr tablet     Take 1 tablet (500 mg) by mouth 2 (two) times daily     traMADol  (ULTRAM ) 50 MG tablet     Take 1 tablet (50 mg) by mouth every 8 (eight) hours as needed for Pain     venlafaxine  (EFFEXOR -XR) 37.5 MG 24 hr capsule     Take 2 capsules (75 mg) by mouth nightly                Review of Systems:   The patient denies any fever, chills, headache, numbness, tingling,  vision changes, hearing changes, chest pain, palpitations, shortness of breath, cough, abdominal pain, nausea, vomiting, constipation, melena, BRBPR, dysuria, frequency, hematuria, or rash.    Reports 2 days diarrhea chronic dizziness, chronic generalized weakness,  Chronic sleepiness.  Patient states she does not remember what occurred after she spoke to her daughter yesterday evening.   She was able to state the year and location and why she was in the hospital now.    Objective:      Patient Vitals for the past 24 hrs:   BP Temp Temp src Pulse Resp SpO2 Height Weight   11/26/23 1527 179/72 97.7 F (36.5 C) Oral (!) 56 17 99 % 1.702 m (5' 7) 79.8 kg (176 lb)   11/26/23 1331 189/80 -- -- 63 19 100 % -- --   11/26/23 1311 (!) 217/83 -- -- 62 (!) 24 100 % -- --   11/26/23 1259 (!) 222/95 -- -- 67 (!) 23 100 % -- --   11/26/23 1255 -- -- -- 68 (!) 23 97 % -- --   11/26/23 1253 147/74 -- -- -- -- -- -- --   11/26/23 1250 -- -- -- 74 (!) 37 100 % -- --   11/26/23 1245 198/82 -- -- 61 19 100 % -- --   11/26/23 1242 (!) 205/80 -- -- 69 21 100 % -- --   11/26/23 1240 -- -- -- 64 21 100 % -- --   11/26/23 1235 (!) 208/84 -- -- 67 21 100 % -- --   11/26/23 1230 190/76 -- -- 67 19 100 % -- --   11/26/23 1135 172/67 97.6 F (36.4 C) Oral (!) 53 20 94 % -- 83.8 kg (184 lb 11.9 oz)      General: awake, alert, oriented x 3; no acute distress  HEENT: Slight facial  droop right side anicteric sclerae, PERRL, EOMI; MMM  Cardiovascular: regular rate and rhythm; no murmurs, rubs, or  gallops  Lungs: clear to auscultation bilaterally without wheezing, rhonchi, or rales  Abdomen: soft, non-distended; non-tender to palpation, no rebound or guarding  Extremities: warm; no LE edema; no clubbing or cyanosis  Neuro: symmetric facial movements, clear speech, moving all extremities  Unable to open eyelids on slight pressure to eyelid          {Disappearing Text    This blank footer for New Consult Notes can be overridden by creating a personal SmartPhrase MSLHOSPFOOTERCONSNEW with your contact information.  The default footer for all note types can be overridden by creating a personal SmartPhrase MSLHOSPFOOTER with your contact information.            :62366}            [1]   Past Surgical History:  Procedure Laterality Date    BREAST BIOPSY Right     2014    CORONARY ARTERY BYPASS      HYSTERECTOMY      partial-1970's   [2]   Family History  Problem Relation Name Age of Onset    Heart failure Mother      Myocardial Infarction Father      Breast cancer Sister     [3]   Social History  Tobacco Use    Smoking status: Former     Current packs/day: 0.00     Types: Cigarettes     Quit date: 1990     Years since quitting: 35.5     Passive exposure: Past    Smokeless tobacco: Never   Vaping Use    Vaping status: Never Used   Substance Use Topics    Alcohol use: Not Currently     Comment: occasionally    Drug use: Never   [4]   Allergies  Allergen Reactions    Fentanyl Anaphylaxis     Hives    Percocet [Oxycodone-Acetaminophen ]     Percolone [Oxycodone]

## 2023-11-26 NOTE — ED to IP RN Note (Signed)
 [facility]  ED NURSING NOTE FOR THE RECEIVING INPATIENT NURSE   ED Dahlgren RN   [facility] [phone]   ED CHARGE RN Hunter RN   ADMISSION INFORMATION   Vanessa Kelly is a 83 y.o. female admitted with an ED diagnosis of:    1. Facial droop    2. Altered mental status, unspecified    3. Dizziness    4. Dysarthria         Isolation: None   Allergies: Fentanyl, Percocet [oxycodone-acetaminophen ], and Percolone [oxycodone]   Holding Orders confirmed? No   Belongings Documented? Yes   Home medications sent to pharmacy confirmed? N/A   NURSING CARE   Patient Comes From:   Mental Status: Other Nursing home  Alert, confused   ADL: Needs assistance with ADLs   Ambulation: Unable to assess   Pertinent Information  and Safety Concerns:     Broset Violence Risk Level: Low BIBA, from NH. Was talking to daughter this morning, daughter noticed pt had AMS. Code stroke called, NIH 4 upon arrival. TNK not given as LWK time unclear. UA pending at this time.      CT / NIH   CT Head ordered on this patient?  Yes   NIH/Dysphagia assessment done prior to admission? Yes   VITAL SIGNS (at the time of this note)      Vitals:    11/26/23 1331   BP: 189/80   Pulse: 63   Resp: 19   Temp:    SpO2: 100%     Pain Score: 0-No pain (11/26/23 1135)

## 2023-11-26 NOTE — ED Notes (Signed)
 Unable to obtain IV access for CTA; Sono IV requested.

## 2023-11-26 NOTE — ED Notes (Signed)
 Downtime dexi glucose 111

## 2023-11-26 NOTE — ED Notes (Signed)
 1233: Patient's daughter at bedside to discuss with Dr. Nivia about tenectaplase

## 2023-11-26 NOTE — ED Notes (Signed)
 Bed: E08  Expected date:   Expected time:   Means of arrival:   Comments:  Stroke Charity fundraiser

## 2023-11-26 NOTE — Plan of Care (Signed)
 Admitted with r/o cva. Failed dysphia screening. NPO

## 2023-11-26 NOTE — ED Provider Notes (Signed)
 [facility]  Emergency Department Physician Note      Diagnosis/Disposition     ED Disposition:  Admit    ED Diagnosis:     Altered mental status, unspecified  Facial droop  Dizziness  Dysarthria    Discharge Prescription    No medications on file       History of Present Illness      Chief Complaint: Altered Mental Status     83 y.o. female with past medical history as below   History of Present Illness  Vanessa Kelly is an 83 year old female with type 2 diabetes, hypertension, and coronary artery disease who presents with altered mental status and right facial droop.    Last known well 10:30 am as per EMS-- at that time she was noted to have onset of altered mental status, responding inappropriately to questions. Upon arrival, she was able to answer some questions.  EMS reported right facial droop as well.  No extremity weakness, no numbness.    She also experiences dizziness and headache.. No chest pain is present. She says she has a history of two to three small strokes in the past, from which she has recovered.    Her past medical history includes type 2 diabetes, hypertension, and coronary artery disease, with a prior coronary artery bypass graft. She is not currently on any anticoagulants.    Independent Historian (other than patient): EMS  Additional History Provided by Independent Historian: EMS     Physical Exam     ED Triage Vitals   Encounter Vitals Group      BP       Girls Systolic BP Percentile       Girls Diastolic BP Percentile       Boys Systolic BP Percentile       Boys Diastolic BP Percentile       Pulse       Resp       Temp       Temp src       SpO2       Weight       Height       Head Circumference       Peak Flow       Pain Score       Pain Loc       Pain Education       Exclude from Growth Chart       Physical Exam   Physical Exam      General: Well developed, well nourished, appears comfortable  Head: Normocephalic, atraumatic.  Eyes: Extra-ocular motions intact.  Pupils are equal and round and reactive to light bilaterally. Sclera are non-icteric and the conjunctiva are pink bilaterally.  ENT: Oropharynx clear and without edema, injection nor exudate. The uvula is midline. There is no appreciable cervical lymphadenopathy.   Respiratory: No respiratory distress. Lungs are clear to auscultation bilaterally with good air exchange.  CV: Regular rate and rhythm. Normal S1/S2. No murmur, gallop or rub. No edema is noted in either leg.  Abdomen: Soft and nondistended. No tenderness to palpation. Bowel sounds are present and normal.   MSK: No swelling, deformity or cyanosis of the upper or lower extremities bilaterally.   Neuro: Alert and oriented  to person and place, not date, face with right facial droop, speech mildly slurred. Strength normal and equal in the upper and lower extremities bilaterally.  Sensation to light touch is preserved. Normal cerebellar function.  Skin: Normal color. Warm and dry.  Psych: Normal affect and thought. Alert and oriented to person, place, not time/date      Medical Decision Making        PRIMARY PROBLEM LIST      1. Acute illness/injury with risk to life or bodily function (based on differential diagnosis or evaluation) DIAGNOSIS: dizziness, right facial droop, AMS   Chronic Illness Impacting Care of the above problem: stroke, Hypertension, Diabetes, and Advanced age Increases complexity of evaluation, Increases the risk of severe disease, and Increase the risk of disease progression   Differential Diagnosis: Confusion:  OD/intoxication, CVA, Dementia, infection     DISCUSSION      Assessment & Plan  Right facial droop and altered mental status  Acute onset suggests possible central cause. Differential includes stroke, given history of previous small strokes.  FS BG normal.  Initial NIH SS is 3.  - Order CT scan of the head without contrast.  - Order CT angiography and CT perfusion.  - Initiate stroke protocol.  - labs, EKG,  CXR    Dizziness  Dizziness could be related to potential cerebrovascular event or other neurological causes.      Goals of Care  Durable do not resuscitate order documented.    12:15 pm called stroke neurologist on call.  With available information including EMS reporting LKW of 10:30 am, Dr. Joni recommends ordering TNK while simultaneously attempting to reach daughter for further clarification and discussion.    12:35 pm--Spoke with daughter in person--she clarifies crucial info that LKW was actually approximately 8 pm last night (11/25/23), and when she video-called the Pt at 10:30 am today  she immediately noticed the new symptoms, however the onset of those symptoms is unknown.  I conferred with Dr. Joni again and we agreed that TNK is therefore contraindicated.  Will give aspirin .  At this time, awaiting radiologist reading of CTA and CTP, however Dr. Always does not see any LVO on his review.      Subsequent radiologist interpretation of CTA/CTP shows no LVO or significant acute abnormality.    The patient was admitted and handed off to hospitalist. We discussed all aspects of the work-up that had been completed in the ED, any pending studies/therapies, and all clinical decision making at the time care was handed off.     Additional Notes                                       NIH Stroke Score      Flowsheet Row Most Recent Value   NIH Stroke Scale    Interval Baseline admission to ED   1a. Level of Consciousness 0   1b. LOC Questions (age, month) 1  [answered June]   1c. LOC Commands (Open and close eyes, Grip AND release good hand) 0   2. Best Gaze 0   3. Visual Fields 0   4. Facial Palsy 1   5a. Motor Left Arm: (Arms with palm down X 10 seconds. Sitting = arms at 90 degrees. Supine = arms 45 degrees) 0   5b. Motor Right Arm: (Arms with palm down X 10 seconds. Sitting = arms at 90 degrees Supine = arms 45 degrees) 0   Total Motor Arms 0   6a. Motor Left Leg: (Leg elevated X 5 seconds Supine = Leg 30  degrees) 0   6b. Motor Right Leg: (Leg  elevated X 5 seconds Supine = Leg 30 degrees) 0   Total Motor Legs 0   7. Limb Ataxia (Finger to nose, heel to shin) 0   8. Sensory (Sensation or grimace to pin prick on face, arm, trunk, and leg) 0   9. Best language (Describe picture, name items, read sentences from NIHSS booklet) 1   10. Dysarthria (Read list from NIHSS booklet) 1   11. Extinction and Inattention (formerly Neglect) - Test tactile and visual stimulation 0   NIHSS Total 4     Alteplase Contraindications:      Flowsheet Row Most Recent Value   Class III Thrombolytic Exclusion Criteria: do NOT give thrombolytic    Onset time greater than 3 or 4.5 hours No   Current OR history of intracranial hemorrhage No   SBP persistently >185 or DBP >110 despite blood pressure management Yes   Suspected subarachnoid hemorrhage No   Known active internal bleeding No   Recent (within last 3 months) intracranial/intraspinal surgery OR serious head trauma OR ischemic stroke No   Intra-axial intracranial neoplasms No   History of warfarin use AND INR > 1.7 No   Platelet count <100,000, INR > 1.7, PT > 15s,  or aPTT > 40s No   Apixaban (Eliquis), dabigatran (Pradaxa), fondaparinux (Arixtra), rivaroxaban (Xarelto), or any anti-coagulant (except warfarin; see above) within 48 hours No   Low molecular weight heparin  (Deltaparin, Enoxaparin , Tinzaparin) within 24 hours No   CT exhibiting extensive regions of clear hypoattenuation (obvious hypodensity) No   Clinical suspicion of infective endocarditis  No   Aortic arch dissection No   Posttraumatic infarction that occurs during the acute in-hospital phase No   Known current structural GI malignancy or recent bleeding event within 21 days No   IV or IA thrombolytic given within 24 hours No   Rapid improvement on NIHSS to 0 with no measureable deficit No   Patient or family refusal No   Class IIb Thrombolytic Exclusion Criteria: thrombolytic may be considered only in carefully selected  patients with these conditions    Taking warfarin and INR < 1.7 (3 to 4.5 hour window) No   Baseline NIHSS > 25 (3 to 4.5 hour window) No   Prior stroke AND diabetes presenting in the 3 to 4.5 hour window No   < 37 years old No   Very mild ischemic stroke symptoms that are judged as non-disabling No   Major surgery OR major trauma (excluding head trauma) in the preceding 14 days No   Lumbar dural puncture in last 7 days No   History of potential bleeding diathesis (e.g. hematologic malignancies) or coagulopathy No   STEMI in last 3 months involving the left anterior myocardium No   Unruptured, incidental aneurysms >49mm or AVMs No   Initial glucose > 400 mg/dl No   Past gastrointestinal/genitourinary bleeding No   High likelihood of left heart thrombus No   Known left atrial or ventricular thrombus No   Presence of an intracardiac mass (cardiac myxoma or fibroelastoma) No   Acute pericarditis No   Arterial puncture of non-compressible site  in last 7 days No   Significant hepatic dysfunction with abnormal INR No   Sickle cell disease  No   Active menorrhagia No   Intracranial arterial dissection No   Preexisting dementia No   Current malignancy No   Preexisting disability No        Vital Signs: Reviewed the patient's vital signs.   Nursing Notes: Reviewed  and utilized available nursing notes.   Medical Records Reviewed: Reviewed available past medical records.   Counseling: The emergency provider has spoken with the patient and discussed today 's findings, in addition to providing specific details for the plan of care. Questions are answered and there is agreement with the plan.     CRITICAL CARE/PROCEDURES    Procedures   30 min critical care      CARDIAC STUDIES      The following cardiac studies were independently interpreted by me the Emergency Medicine Provider. For full cardiac study results please see chart.              EKG 1 interpreted by me (ED provider)     Time Interpreted:   Rate: 60-100   Rhythm:  Normal Sinus Rhythm with 1st degree AV block  ST segments: No acute changes   STEMI?: No   EKG interpretation: Nonspecific                                 EMERGENCY IMAGING STUDIES      The following imaging studies were independently interpreted by me (emergency medicine provider):   Xray interpreted by ED provider? : Chest Xray Interpreted by me (ED provider) SIDE : N/A      RESULT: No infiltrate. No pneumothorax. No large hemothorax. No CHF.   IMPRESSION: No acute abnormality                          Supplemental Encounter Data   Medical History[7]  Past Surgical History[8]  Social History[9]  Family History[10]  Allergies[11]    Encounter Orders:  Orders Placed This Encounter   Procedures    CT Head WO Contrast    CTA  Head & Neck    CT Perfusion Brain    Chest  AP Portable    CBC with Differential (Order)    Comprehensive Metabolic Panel    PT/INR    APTT    High Sensitivity Troponin-I    Urinalysis with Reflex to Microscopic Exam and Culture    Urine Elnor Culture Hold Tube    CBC with Differential (Component)    Cardiac monitoring (ED ONLY)    Continuous Pulse Oximetry    Dysphagia Screen    NPO (Including Aspirin ) until dysphagia screen done and passed    Nursing Stroke Protocol and NIHSS    Pupillometer Check    do NOT wait for creatinine/GFR prior to IV contrast administration for acute stroke neuroimaging    Glucose POC    Pupillometer Check    Vital signs    NIH Stroke Score    ECG 12 lead    Insert peripheral IV    Saline lock IV    Admit to Inpatient     Medications Administered:  Medications   aspirin  suppository 300 mg (has no administration in time range)   iohexol  (OMNIPAQUE ) 350 MG/ML injection 100 mL (100 mLs Intravenous Imaging Agent Given 11/26/23 1222)     Laboratory and Imaging Studies:  Results for orders placed or performed during the hospital encounter of 11/26/23 (from the past 24 hours)    Collection Time: 11/26/23 11:39 AM   Result Value    Whole Blood Glucose POCT 111 (H)    Comprehensive Metabolic Panel    Collection Time: 11/26/23 12:32 PM   Result Value    Glucose 84  BUN 23 (H)    Creatinine 1.5 (H)    Sodium 130 (L)    Potassium 5.4 (H)    Chloride 101    CO2 24    Calcium  9.5    Anion Gap 5.0    GFR 33.5 (L)    AST (SGOT) 25    ALT 32    Alkaline Phosphatase 83    Albumin 3.2 (L)    Protein, Total 6.4    Globulin 3.2    Albumin/Globulin Ratio 1.0    Bilirubin, Total 0.3   PT/INR    Collection Time: 11/26/23 12:32 PM   Result Value    PT 11.8    INR 1.0   APTT    Collection Time: 11/26/23 12:32 PM   Result Value    PTT 26 (L)   High Sensitivity Troponin-I    Collection Time: 11/26/23 12:32 PM   Result Value    hs Troponin 11.3   CBC with Differential (Component)    Collection Time: 11/26/23 12:32 PM   Result Value    WBC 7.31    Hemoglobin 9.0 (L)    Hematocrit 27.0 (L)    Platelet Count 202    MPV 9.6    RBC 2.73 (L)    MCV 98.9 (H)    MCH 33.0    MCHC 33.3    RDW 13    nRBC % 0.0    Absolute nRBC 0.00    Preliminary Absolute Neutrophil Count 5.22    Neutrophils % 71.3    Lymphocytes % 17.8    Monocytes % 7.7    Eosinophils % 2.2    Basophils % 0.7    Immature Granulocytes % 0.3    Absolute Neutrophils 5.22    Absolute Lymphocytes 1.30    Absolute Monocytes 0.56    Absolute Eosinophils 0.16    Absolute Basophils 0.05    Absolute Immature Granulocytes 0.02     CT Perfusion Brain   Final Result      1.No perfusion deficit suggestive of ischemic penumbra or infarct core.   2.Mildly delayed transit right cerebral hemisphere secondary to chronically   occluded right ICA.     3.There is 25% stenosis of the proximal left internal carotid artery based   on NASCET criteria.   4.The vertebrobasilar arterial system is patent.    5.Intracranial atherosclerosis without intracranial large vessel occlusion.      Vanessa ONEIDA Sewer, MD   11/26/2023 12:43 PM      CTA  Head & Neck   Final Result      1.No perfusion deficit suggestive of ischemic penumbra or infarct core.   2.Mildly delayed  transit right cerebral hemisphere secondary to chronically   occluded right ICA.     3.There is 25% stenosis of the proximal left internal carotid artery based   on NASCET criteria.   4.The vertebrobasilar arterial system is patent.    5.Intracranial atherosclerosis without intracranial large vessel occlusion.      Vanessa ONEIDA Sewer, MD   11/26/2023 12:43 PM      CT Head WO Contrast   Final Result       1.No intracranial hemorrhage or acute intracranial findings.   2.Parenchymal volume loss and chronic small vessel changes.      [name], MD   11/26/2023 11:58 AM      Chest  AP Portable    (Results Pending)                  [  7]   Past Medical History:  Diagnosis Date    Diabetes mellitus (CMS/HCC)     Gastroesophageal reflux disease     Hyperlipidemia     Hypertension     Hypotension     Malignant tumor of breast (CMS/HCC) 04/13/2016    Seasonal allergic rhinitis     Syncope    [8]   Past Surgical History:  Procedure Laterality Date    BREAST BIOPSY Right     2014    CORONARY ARTERY BYPASS      HYSTERECTOMY      partial-1970's   [9]   Social History  Tobacco Use    Smoking status: Former     Current packs/day: 0.00     Types: Cigarettes     Quit date: 1990     Years since quitting: 35.5     Passive exposure: Past    Smokeless tobacco: Never   Vaping Use    Vaping status: Never Used   Substance Use Topics    Alcohol use: Not Currently     Comment: occasionally    Drug use: Never   [10]   Family History  Problem Relation Name Age of Onset    Heart failure Mother      Myocardial Infarction Father      Breast cancer Sister     [11]   Allergies  Allergen Reactions    Fentanyl Anaphylaxis     Hives    Percocet [Oxycodone-Acetaminophen ]     Percolone [Oxycodone]         Tran, Bryan J, MD  11/26/23 1650

## 2023-11-27 ENCOUNTER — Inpatient Hospital Stay

## 2023-11-27 ENCOUNTER — Inpatient Hospital Stay (HOSPITAL_COMMUNITY)

## 2023-11-27 DIAGNOSIS — I5030 Unspecified diastolic (congestive) heart failure: Secondary | ICD-10-CM

## 2023-11-27 LAB — ECHO ADULT TTE LIMITED
BP Mod LV Ejection Fraction: 64
IVS Diastolic Thickness (2D): 1.28
LVID diastole (2D): 3.89
LVID systole (2D): 2.19
Pulmonary Valve Findings: NORMAL
TAPSE: 1.27
Tricuspid Valve Findings: NORMAL

## 2023-11-27 LAB — WHOLE BLOOD GLUCOSE POCT
Whole Blood Glucose POCT: 111 mg/dL — ABNORMAL HIGH (ref 70–100)
Whole Blood Glucose POCT: 124 mg/dL — ABNORMAL HIGH (ref 70–100)
Whole Blood Glucose POCT: 188 mg/dL — ABNORMAL HIGH (ref 70–100)
Whole Blood Glucose POCT: 162 mg/dL — ABNORMAL HIGH (ref 70–100)

## 2023-11-27 LAB — LAB USE ONLY - CBC WITH DIFFERENTIAL
Absolute Basophils: 0.04 x10 3/uL (ref 0.00–0.08)
Absolute Eosinophils: 0.27 x10 3/uL (ref 0.00–0.44)
Absolute Immature Granulocytes: 0.02 x10 3/uL (ref 0.00–0.07)
Absolute Lymphocytes: 1.49 x10 3/uL (ref 0.42–3.22)
Absolute Monocytes: 0.48 x10 3/uL (ref 0.21–0.85)
Absolute Neutrophils: 2.85 x10 3/uL (ref 1.10–6.33)
Absolute nRBC: 0 x10 3/uL (ref <=0.00)
Basophils %: 0.8 %
Eosinophils %: 5.2 %
Hematocrit: 26.6 % — ABNORMAL LOW (ref 34.7–43.7)
Hemoglobin: 9 g/dL — ABNORMAL LOW (ref 11.4–14.8)
Immature Granulocytes %: 0.4 %
Lymphocytes %: 28.9 %
MCH: 33.1 pg (ref 25.1–33.5)
MCHC: 33.8 g/dL (ref 31.5–35.8)
MCV: 97.8 fL — ABNORMAL HIGH (ref 78.0–96.0)
MPV: 10.1 fL (ref 8.9–12.5)
Monocytes %: 9.3 %
Neutrophils %: 55.4 %
Platelet Count: 188 x10 3/uL (ref 142–346)
Preliminary Absolute Neutrophil Count: 2.85 x10 3/uL (ref 1.10–6.33)
RBC: 2.72 x10 6/uL — ABNORMAL LOW (ref 3.90–5.10)
RDW: 13 % (ref 11–15)
WBC: 5.15 x10 3/uL (ref 3.10–9.50)
nRBC %: 0 /100{WBCs} (ref <=0.0)

## 2023-11-27 LAB — BASIC METABOLIC PANEL
Anion Gap: 10 (ref 5.0–15.0)
BUN: 20 mg/dL (ref 7–21)
CO2: 22 meq/L (ref 17–29)
Calcium: 9.6 mg/dL (ref 7.9–10.2)
Chloride: 106 meq/L (ref 99–111)
Creatinine: 1.4 mg/dL — ABNORMAL HIGH (ref 0.4–1.0)
GFR: 36.4 mL/min/1.73 m2 — ABNORMAL LOW (ref >=60.0)
Glucose: 97 mg/dL (ref 70–100)
Potassium: 4.6 meq/L (ref 3.5–5.3)
Sodium: 138 meq/L (ref 135–145)

## 2023-11-27 LAB — VITAMIN B12: Vitamin B-12: 855 pg/mL (ref 211–911)

## 2023-11-27 LAB — LIPID PANEL
Cholesterol / HDL Ratio: 2 {index}
Cholesterol: 140 mg/dL (ref <=199)
HDL: 71 mg/dL
LDL Calculated: 61 mg/dL (ref 0–99)
Triglycerides: 42 mg/dL
VLDL Calculated: 8 mg/dL — ABNORMAL LOW (ref 10–40)

## 2023-11-27 LAB — CORTISOL, AM: Cortisol, AM: 9.8 ug/dL (ref 3.7–19.4)

## 2023-11-27 LAB — NT-PROBNP: NT-ProBNP: 1182 pg/mL — ABNORMAL HIGH (ref <450)

## 2023-11-27 LAB — THYROID STIMULATING HORMONE (TSH) WITH REFLEX TO FREE T4: TSH: 1.31 u[IU]/mL (ref 0.35–4.94)

## 2023-11-27 NOTE — PT Progress Note (Signed)
 [facility]  Physical Therapy Attempt Note    Patient:  Vanessa Kelly MRN#:  [id number]    Unit:  [facility] INTERMEDIATE CARE Room/Bed:  MI633/MI633-01    PT Cancellation: Visit  PT Visit Cancellation Reason: Testing/Procedure (per RN pt off unit for echo. PT will follow up with pt as schedule permits.)           Signature:   Anastasia Sofia, PT, DPT  11/27/2023  10:48 AM       (For scheduling questions, please contact rehab tech x [phone])

## 2023-11-27 NOTE — Progress Notes (Signed)
 11/27/23 1128   CMA Tasks   CMA tasks IMM delivered

## 2023-11-27 NOTE — Consults (Signed)
 NEUROLOGY CONSULTATION    Date Time: 11/27/23 1:41 PM  Patient Name: Vanessa Kelly  Attending Physician: Star Diamond BIRCH, MD      Assessment & Plan:   Altered mental status encephalopathy likely lingering effects, from scopolamine  patch no other clear-cut etiology trigger seen on initial testing including brain MRI chest x-ray urine analysis  Will check B12 folate TSH levels  Wait for another 24 to 36-hour for side effects to subside  If patient does not get better, or worsens, may need to consider getting LP, but at this point, given only minimal headache and no neck stiffness, yield is going to be low  Discussed at length with the patient's daughter who is sitting at the bedside        History of Present Illness:   Neurology consultation requested by:--> Dr. Lonnie  Patient is 83 year old lady, recently discharged, from nursing facility where she was admitted for rehab after recent fall, patient states that she was placed on a scopolamine  patch the day before her discharge, the day after discharge, the patient already said that she was doing fine, but the next day she was very confused hallucinating, and slurring her speech and she was brought over to the hospital.  Scopolamine  patch has been discontinued yesterday.  Patient also reports  visual blurring    Daughter feels that she is still very confused disoriented and hallucinating.              Recent Labs   Lab 11/26/23  1232   Cholesterol 140   Triglycerides 42   HDL 71   LDL Calculated 61     Recent Labs   Lab 11/26/23  1232   LDL Calculated 61        Past Medical History:   Medical History[1]    Meds:   Home medications Tylenol  albuterol  Tums Atacand  vitamin D  Allegra Flonase  Lidoderm  patches Mevacor ProAmatine  tramadol  aspirin  insulin  Ranexa  Effexor --and scopolamine  patch      Allergies[2]    Social & Family History:   Social History[3]  Family History[4]        CODE STATUS: Full code  I personally reviewed all of the medications.  Medication list  generated using all available resources.  Elder abuse (physical)  - negative  Advanced care plan - reviewed from chart or in discussion with pt or family    Review of Systems:   No severe only very minimal intermittent mild headache, No eye, ear nose, throat problems; no coughing or wheezing or shortness of breath, No chest pain or orthopnea, no abdominal pain, nausea or vomiting, No pain in the body or extremities, no psychiatric, neurological, endocrine, hematological or cardiac complaints except as noted above.       Physical Exam:   Blood pressure 147/67, pulse 71, temperature 98.2 F (36.8 C), temperature source Axillary, resp. rate 18, height 1.702 m (5' 7), weight 79.8 kg (176 lb), SpO2 99%.    HEENT: Normocephalic.no carotid bruits no neck stiffness on exam  Lungs:  CTA bil  Abd Soft   Cardiac:  S1,S2, normal rate and rhythm  Neck: supple, no cartoid bruits  Extremities: no edema  Skin: no rashes seen in exposed areas     Neuro:  Level of consciousness:  Alert and appropriate  Oriented:  X oriented to hospital, July, 2025 could not tell me the correct date  Cognition:  Intact naming, recognition, concentration and following complex commands  Cranial Nerves:  II-XII intact eye movements intact pupils  are large difficult to see reaction  Strength:  No upper extremity drift, 5/5 strength x 4 extremities mild postural tremor no asterixis slight tremulousness restlessness appreciated  Coordination:  Intact FTN testing  Reflexes:  +2 throughout, down going toes bil  Sensation: Intact x 4 extremities to LT, temp,   Patient not really making eye contact seems to be having some internal stimuli or reacting to them from time to time  Labs:     Recent Labs   Lab 11/27/23  0621 11/26/23  1232   Glucose 97 84   BUN 20 23*   Creatinine 1.4* 1.5*   Calcium  9.6 9.5   Sodium 138 130*   Potassium 4.6 5.4*   Chloride 106 101   CO2 22 24   Albumin  --  3.2*   AST (SGOT)  --  25   ALT  --  32   Bilirubin, Total  --  0.3    Alkaline Phosphatase  --  83     Recent Labs   Lab 11/27/23  0621 11/26/23  1232   WBC 5.15 7.31   Hemoglobin 9.0* 9.0*   Hematocrit 26.6* 27.0*   MCV 97.8* 98.9*   MCH 33.1 33.0   MCHC 33.8 33.3   Platelet Count 188 202         Recent Labs     11/26/23  1232   PTT 26*   PT 11.8   INR 1.0        MR Angiogram Head WO Contrast  Result Date: 11/26/2023  1.Motion degraded exam. 2.Occlusion of the right internal carotid artery, with reconstitution of the carotid terminus, from collaterals. 3.Motion artifact limits evaluation of stenosis in the proximal left internal carotid artery. The more distal cervical left internal carotid artery is patent. 4.Motion artifact limits evaluation of the intracranial arteries. The left anterior cerebral artery is not definitively seen, likely due to artifacts. 5.The vertebrobasilar arterial system is patent. Oakley ONEIDA Miyamoto, MD 11/26/2023 5:38 PM    MR Angiogram Neck WO Contrast  Result Date: 11/26/2023  1.Motion degraded exam. 2.Occlusion of the right internal carotid artery, with reconstitution of the carotid terminus, from collaterals. 3.Motion artifact limits evaluation of stenosis in the proximal left internal carotid artery. The more distal cervical left internal carotid artery is patent. 4.Motion artifact limits evaluation of the intracranial arteries. The left anterior cerebral artery is not definitively seen, likely due to artifacts. 5.The vertebrobasilar arterial system is patent. Oakley ONEIDA Miyamoto, MD 11/26/2023 5:38 PM    MRI Brain WO Contrast  Result Date: 11/26/2023   1.No acute intracranial abnormality. 2.Mild volume loss and mild chronic microangiopathic ischemic changes. Chronic right parietal infarct, unchanged. 3.Complete opacification of right maxillary sinus. Oakley ONEIDA Miyamoto, MD 11/26/2023 5:32 PM       All recent brain and spine imaging (MRI, CT) personally reviewed.    Chart reviewed    Case discussed with:     TTS ; > Time spent examining patient, in  counseling or coordination of care, reviewing test results, and in documentation. 50% time spent in counseling or coordination of care        This note was generated by the Epic EMR system/Speech recognition and may contain inherent errors or omissions not intended by the user. Grammatical errors, random word insertions, deletions and pronoun errors  are occasional consequences of this technology due to software limitations.   Not all errors are caught or corrected. If there are questions or concerns about the  content of this note or information contained within the body of this dictation they should be addressed directly with the author for clarification.    Signed by: Reese GORMAN Erps, MD,   Spectralink: [phone]      Answering Service: [phone]           [1]   Past Medical History:  Diagnosis Date    Diabetes mellitus (CMS/HCC)     Gastroesophageal reflux disease     Hyperlipidemia     Hypertension     Hypotension     Malignant tumor of breast (CMS/HCC) 04/13/2016    Seasonal allergic rhinitis     Syncope    [2]   Allergies  Allergen Reactions    Fentanyl Anaphylaxis     Hives    Percolone [Oxycodone]      OXYCODONE allergy hives- no allergy tylenol     [3]   Social History  Socioeconomic History    Marital status: Widowed   Tobacco Use    Smoking status: Former     Current packs/day: 0.00     Types: Cigarettes     Quit date: 1990     Years since quitting: 35.5     Passive exposure: Past    Smokeless tobacco: Never   Vaping Use    Vaping status: Never Used   Substance and Sexual Activity    Alcohol use: Not Currently     Comment: occasionally    Drug use: Never    Sexual activity: Not Currently     Social Drivers of Health     Financial Resource Strain: Low Risk  (11/27/2023)    Overall Financial Resource Strain (CARDIA)     Difficulty of Paying Living Expenses: Not hard at all   Food Insecurity: No Food Insecurity (11/26/2023)    Hunger Vital Sign     Worried About Running Out of Food in the Last Year: Never  true     Ran Out of Food in the Last Year: Never true   Transportation Needs: No Transportation Needs (11/27/2023)    PRAPARE - Therapist, art (Medical): No     Lack of Transportation (Non-Medical): No   Physical Activity: Inactive (08/19/2023)    Exercise Vital Sign     Days of Exercise per Week: 0 days     Minutes of Exercise per Session: 0 min   Stress: No Stress Concern Present (08/19/2023)    [employer] of Occupational Health - Occupational Stress Questionnaire     Feeling of Stress : Only a little   Social Connections: Moderately Isolated (08/19/2023)    Social Connection and Isolation Panel     Frequency of Communication with Friends and Family: Twice a week     Frequency of Social Gatherings with Friends and Family: Once a week     Attends Religious Services: 1 to 4 times per year     Active Member of [employer] or Organizations: No     Attends Banker Meetings: Never     Marital Status: Widowed   Intimate Partner Violence: Not At Risk (11/26/2023)    Humiliation, Afraid, Rape, and Kick questionnaire     Fear of Current or Ex-Partner: No     Emotionally Abused: No     Physically Abused: No     Sexually Abused: No   Housing Stability: Unknown (11/27/2023)    Housing Stability Vital Sign     Unable to Pay for Housing in the Last  Year: No     Homeless in the Last Year: No   [4]   Family History  Problem Relation Name Age of Onset    Heart failure Mother      Myocardial Infarction Father      Breast cancer Sister

## 2023-11-27 NOTE — Progress Notes (Signed)
 [facility]  Internal Medicine Hospitalists  Progress Note        Assessment / Plan:      Vanessa Kelly is a 83 y.o. female with a  diabetes, GERD, hyperlipidemia, hypertension, hypotension, insomnia, orthostatic hypotension, coronary artery disease CABG x 4, hyperlipidemia, PVD, muscle weakness, cerebrovascular disease, asthma, parkinsonism, CKD, fracture of medial wall of orbit June 2025, frequent falls.  Recently hospitalized after fall orbital fracture and was discharged to rehab facility has been home 1 week.        Arrives to ER with altered mental status last known normal 1030pm per ED note upon arrival was able to answer questions EMS reported right facial droop patient also with complaint of dizziness headache.  In the ED, CT head no CVA, CTA noted chronic right ICA occlusion..  Troponin x 1 negative, blood glucose on arrival 111.  Urinalysis no leukocyte no WBC no nitrite.   Patient evaluated at bedside daughter Vanessa Kelly is also at bedside.  Daughter reports that she speaks to patient in the evening via FaceTime and yesterday evening patient was her usual.  Daughter did noticed that patient was using a walker and was advised to only use wheelchair and bedside commode if needed.  Today  speaking to patient at 1030 she reported that patient was making nonsensical statements.  Did not seem herself daughter was concerned that maybe this was a UTI as she has had prior and appeared this way.  Last known patient norm was yesterday at 10:30 PM.              #Chronically occluded right ICA  #Altered mental status  #Facial droop  #Dysarthria improved   #Dizziness intermittent   #Dysphagia awaiting SLP  #Chronically occluded right ICA incidental find on CTA head/neck   -Oriented to name not oriented to location and thinks that she is in her nursing home which she was in over a week ago, stated year was 2021, no focal deficit noted to continue to have difficulty opening eyelids with mild pressure  to eyelids bilateral.  -Per family member no known history of stroke  -Admitted for rule out CVA, TIA admission protocol, telemetry, every 4 hours neurocheck  -Neurology consulted, awaiting any further recommendation  -CT head parenchymal mild volume loss and chronic small vessel acute ischemic change  -7/27 CTA on admission mildly delayed transit right cerebral hemisphere secondary to chronically occluded right ICA, intracranial atherosclerosis without intracranial large vessel occlusion, 25% stenosis of the proximal left internal carotid. Consider inpatient vascular evaluation given chronically occluded R ICA if MRI shows signs of stroke or concern for TIA  -MRI 7/27 no acute stroke, right parietal chronic infarct-not known prior to patient or daughter Vanessa Kelly discussed this morning.   -MRA head and neck brain, occlusion of right internal carotid with reconstitution of carotid terminus from collateral  -PT OT swallow eval, patient did not pass bedside nurse swallow eval. reordered nurse swallow swallow eval while awaiting SLP .  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  Baseline creatinine 1.1  10/2023 B12 500 folate 14 ferritin 44 iron saturation 47  Cortisol level that was added on to admit lab work drawn at  1230 not ideal. Cortisol resulted 10.7 ACTH  pending      #Excessive sleepiness,chronic  Possible adrenal insufficiency  #Frequent falls-recent R orbital fracture 10/2023  With hyperkalemia, hyponatremia, elevated BUN/creatinine, mild elevated calcium   Daughter reports patient excessively sleepy for some time now generalized weakness known orthostatic hypotension also hypoglycemic ,depression recently  patient reporting diarrhea past 2 days  A.m. cortisol 9.8 of note 11 months ago cortisol was drawn 6.9   Would still consider possible adrenal insufficiency consider cosyntropin  test for confirmatory.  Renin angiotensin pending, ACTH  pending      # Anemia chronic  H/H 9/26.9--> 10.5/31.2--> 9.0/27--> 9.0/26.6 stable no gross bleed  Iron studies June, iron saturation 47 ferritin 44 folate 14 B12 500       # Hypertensive urgency on admission in setting of known hypertension  #CAD -CABG (2002), stents (2006)   #Cardiomegaly (7/27 CXR with mild pulm edema)  #HLD  Family member reports patient's blood pressure is labile  Chest x-ray on admission 7/27 cardiomegaly mild pulmonary edemaEcho 10/15/2023 EF 69%, mild concentric left ventricular remodeling, left ventricular diastolic function indeterminate, mild aortic valve sclerotic 5 Brohl calcific changes mild aortic regurgitation trace to mild tricuspid regurg. Compared to 12/2022 similar   At home uses candesartan  4 mg as needed or midodrine  if BP low  Lovastatin 40 mg a day will hold for now follow blood pressure closely once MRI brain MRI head neck without acute find and neurology evaluated patient would resume.  Midodrine  as needed for hypotension and meclizine   Ranolazine (Ranexa ) ER 500 every 12  Can allow permissive hypertension for 24 hours if MRI findings show stroke  Nt bnp 1182-given AKI dehydration would hold off on diuretics.  Patient does not appear overloaded lung sounds clear.   Echo 7/28 EF 64% moderate left ventricular hypertrophy  NT [id number] likely elevated due to CKD, does not appear fluid overloaded  Lipid profile t chol 140 Tri 42, HDL 71 LDL 61        #Orthostatic hypotension  Use of home meds midodrine  as needed for hypotension and meclizine   Recently prescribed scopolamine  on 7/18 1 patch every 3 days would have used 3-4 patches  Orthostatics ordered     # DM II  Every 4 hours blood sugar while n.p.o.  Per daughter at bedside blood sugars are  labile  Daughter at bedside reports that patient uses insulin  as needed.  Depending on blood sugars  A1c 6.7 in June   SSI resume     #GERD  Protonix  40 mgPO/IV ordered     Osteoarthritis  S/p right TKA  -Continue home PRN lidocaine  patches     #Depression   Effexor  resume once patient passes swallow eval     Code status NO CPR with support               VTE Prophylaxis: enoxaparin  (LOVENOX ) syringe 40 mg  Place sequential compression device     Foley Catheter: No Foley Present    Venous Access: No Temporary Central Line Present    Medical Readiness for Discharge: Anticipated in 2-4 Days    Open Handoff Activity in Sidebar    Additional Diagnoses:               Subjective:      When asked patient full name and birthday able to provide.  Stated she has a [employer]ologist, engineering center and the year was 2021.  Asking patient approximately 30 minutes later she was able to state correct answer  after told [facility] and year.    Upon returning to room 40 minutes later with daughter Vanessa Kelly at bedside patient stating that she needs her shoes that she was incorrectly again stating she is at [facility] nursing center.    Daughter stresses the patient left alone at home because she is very appropriate to read the newspaper and was very independent.  Daughter had no concerns regarding mentation prior to hospitalization.    Patient was at [facility] center at discharge approximately a week ago.    > 40 min at bedside total   > 20 min d/w daughter,  d/w Dr Mauro and RN Kathyryn  Confirming consult neurology          Objective:      Temp:  [97.5 F (36.4 C)-98.2 F (36.8 C)] 98.2 F (36.8 C)  Heart Rate:  [56-76] 71  Resp Rate:  [16-24] 18  BP: (140-222)/(48-95) 147/67   General: awake, alert, oriented to name no acute distress  HEENT: anicteric sclerae, PERRL, EOMI; MMM  Cardiovascular: regular rate and rhythm; no murmurs, rubs, or gallops  Lungs: clear to auscultation bilaterally without wheezing, rhonchi, or  rales  Abdomen: soft, non-distended; non-tender to palpation, no rebound or guarding  Extremities: warm; no LE edema; no clubbing or cyanosis  Neuro: symmetric facial movements, clear speech, moving all extremities

## 2023-11-27 NOTE — Plan of Care (Signed)
 Problem: Moderate/High Fall Risk Score >5  Goal: Patient will remain free of falls  Outcome: Progressing  Flowsheets (Taken 11/27/2023 1245)  High (Greater than 13):   HIGH-Bed alarm on at all times while patient in bed   HIGH-Apply yellow Fall Risk arm band   MOD-Re-orient confused patients     Problem: Every Day - Stroke  Goal: Core/Quality measure requirements - Daily  Outcome: Progressing  Flowsheets (Taken 11/27/2023 1245)  Core/Quality measure requirements - Daily:   VTE Prevention: Ensure anticoagulant(s) administered and/or anti-embolism stockings/devices documented by end of day 2   Ensure antithrombotic administered or contraindication documented by LIP by end of day 2   Once lipid panel has resulted, check LDL. Contact provider for statin order if LDL > 70 (or ensure contraindication documented by LIP).   Continue stroke education (must include Modifiable Risk Factors, Warning Signs and Symptoms of Stroke, Activation of Emergency Medical System and Follow-up Appointments). Ensure handout has been given and documented.  Goal: Neurological status is stable or improving  Outcome: Progressing  Flowsheets (Taken 11/27/2023 1245)  Neurological status is stable or improving:   Monitor/assess/document neurological assessment (Stroke: every 4 hours)   Monitor/assess NIH Stroke Scale   Re-assess NIH Stroke Scale for any change in status   Observe for seizure activity and initiate seizure precautions if indicated   Perform CAM Assessment  Goal: Stable vital signs and fluid balance  Outcome: Progressing  Flowsheets (Taken 11/27/2023 1245)  Stable vital signs and fluid balance:   Monitor and assess vitals every 4 hours or as ordered and hemodynamic parameters   Apply telemetry monitor as ordered  Goal: Patient's risk of aspiration will be minimized  Outcome: Progressing  Flowsheets (Taken 11/27/2023 1245)  Patient's risk of aspiration will be minimized:   Monitor/assess for signs of aspiration (tachypnea, cough,  wheezing, clearing throat, hoarseness after eating, decrease in SaO2   Assess and monitor ability to swallow   Keep head of bed up a minimum of 30 degrees when hemodynamically stable     Problem: Compromised Nutrition  Goal: Nutrition Interventions  Outcome: Progressing  Flowsheets (Taken 11/27/2023 1245)  Nutrition Interventions: Discuss nutrition at Rounds, I&Os, Document % meal eaten, Daily weights

## 2023-11-27 NOTE — Stroke Progress Note (Signed)
 Rounded on patient. Discussed signs and symptoms of stroke, when to call 911, risk factors, disease process, imaging, treatment plan, rehab, medications, discharge plan and consequences of noncompliance. Patient's daughter verbalized understanding, no evidence of learning by the patient. All questions and concerns were addressed. Gave NSA booklet Explaining Stroke, [employer].    Petra LOISE Barrett, RN  Stroke Navigator

## 2023-11-27 NOTE — Progress Notes (Signed)
 Case Management location: onsite    CM completed assessment with pt and pt daughter Bernice at bedside. Pt confirmed demographics and PCP information.     Pt is from [facility];DCP return to ALF    Transportation:  Medical transport        11/27/23 1259   Patient Type   Within 30 Days of Previous Admission? No   Healthcare Decisions   Interviewed: Patient;Family   Interviewee Contact Information: Bernice (daughter) [phone]   Orientation/Decision Making Abilities of Patient Other (coment)  (AMS)   Advance Directive Patient has advance directive, copy not in chart   Prior to admission   Prior level of function Ambulates with assistive device;Needs assistance with ADLs   Type of Residence Assisted living  [facility])   Have running water , electricity, heat, etc? Yes   Living Arrangements Other (Comment)   How do you get to your MD appointments? see provider at ALF   How do you get your groceries? ALF provides   Who fixes your meals? ALF provides   Who does your laundry? ALF provides   Who picks up your prescriptions? ALF provides   Dressing Needs assistance   Grooming Needs assistance   Feeding Independent   Bathing Needs assistance   Toileting Needs assistance   DME Currently at [facility], [facility];Wheelchair, Manual   Prior SNF admission? (Detail) [facility]   Discharge Planning   Support Systems Children;Home care staff   Expected Discharge Disposition ALF   Anticipated [place] plan discussed with: Same as interviewed   Mode of transportation: Other  (medical transport)   Does the patient have perscription coverage? Yes   Financial Resource Strain   How hard is it for you to pay for the very basics like food, housing, medical care, and heating? Not hard   Housing Stability   In the last 12 months, was there a time when you were not able to pay the mortgage or rent on time? N   At any time in the past 12 months, were you homeless or living in a shelter (including now)? N   Transportation  Needs   In the past 12 months, has lack of transportation kept you from medical appointments or from getting medications? no   In the past 12 months, has lack of transportation kept you from meetings, work, or from getting things needed for daily living? No   Consults/Providers   PT Evaluation Needed 1   OT Evalulation Needed 1   SLP Evaluation Needed 1   Correct PCP listed in Epic? Yes   Family and PCP   PCP on file was verified as the current PCP? Yes   In case you are admitted, transferred or discharged, would like family notified? Yes   Name of family member to be notified NOK   In case you are admitted, transferred or discharged, would like your PCP notified? Yes     Noreen Louder, MSW  Social Worker Case Manager I  [facility]  [phone]

## 2023-11-27 NOTE — SLP Eval Note (Signed)
 [facility]  Speech Language Pathology  Clinical/Bedside Swallow Evaluation    Patient: Vanessa Kelly    MRN#: [id number]   Unit: [facility] INTERMEDIATE CARE Bed: MI633/MI633-01    Date/Time of Treatment:   SLP Received On: 11/27/23  Start Time: 1135  Stop Time: 1150  Time Calculation (min): 15 min    Interpreter utilized: no, not indicated    Therapist PPE during session gloves   Assessment   Vanessa Kelly is a 83 y.o. female admitted 11/26/2023 for fall with rollator resulting in R facial soft tissue injury, R supraorbital facial laceration, R orbital fracture.  presenting with AMS . PMHx significant for diabetes, GERD, hyperlipidemia, hypertension, hypotension, insomnia, orthostatic hypotension, coronary artery disease CABG x 4, hyperlipidemia, PVD, muscle weakness, cerebrovascular disease, asthma, parkinsonism, CKD, fracture of medial wall of orbit June 2025, frequent falls.  . Consult received for clinical swallow eval.    Swallow evaluation revealed oropharyngeal stage to be intact with no overt s/sx of aspiration, globus sensation, anterior loss, oral residuals or pocketing. Initial assistance required to assist patient with initiation of feeding. Recommend upgrade to regular solids/thin liquids with encouragement of hydration throughout day. No further SLP follow up indicated.         Plan/Recommendations   Recommendations  Plan: begin/continue oral diet, no SLP f/u necessary     Diet Solids Recommendation: Regular (IDDSI RG7)  Diet Liquids Recommendations: Thin Liquids (IDDSI TN0)  Precautions/Compensations: Awake/alert, Alternate solids and liquids, Upright 90 degrees for all oral intake, Eat/feed slowly  Recommended Form of Meds: PO, whole, with puree (IDDSI level 4), with liquid  Suggestions for Feeding: close supervision     Recommendations: No follow up required, Home with supervision/assistance  Recommendation Discussed With: : Patient, Physician, Nurse, Family/Caregiver       Goals:  Patient will eat and drink regular solids and thin liquids (met)  Rehabilitation Prognosis: fair due to comorbidities and medical diagnosis/prognosis    History     Prior Speech Therapy Intervention: N/A    Social History: Lives at ALF  Baseline Diet: Regular/thin  Feeding: Independent  Hearing: Intact  Vision: Intact  Cognition: Intact    Dysphagia Related Hx:  Dysphagia: No  Recurrent PNA: N/A  Unintended weight loss: N/A  Reflux: N/A    Medical Diagnosis: Dizziness [R42]  Facial droop [R29.810]  Dysarthria [R47.1]  Altered mental status, unspecified [R41.82]    Imaging:  MR Angiogram Head WO Contrast  Result Date: 11/26/2023  1.Motion degraded exam. 2.Occlusion of the right internal carotid artery, with reconstitution of the carotid terminus, from collaterals. 3.Motion artifact limits evaluation of stenosis in the proximal left internal carotid artery. The more distal cervical left internal carotid artery is patent. 4.Motion artifact limits evaluation of the intracranial arteries. The left anterior cerebral artery is not definitively seen, likely due to artifacts. 5.The vertebrobasilar arterial system is patent. Vanessa ONEIDA Miyamoto, MD 11/26/2023 5:38 PM    MR Angiogram Neck WO Contrast  Result Date: 11/26/2023  1.Motion degraded exam. 2.Occlusion of the right internal carotid artery, with reconstitution of the carotid terminus, from collaterals. 3.Motion artifact limits evaluation of stenosis in the proximal left internal carotid artery. The more distal cervical left internal carotid artery is patent. 4.Motion artifact limits evaluation of the intracranial arteries. The left anterior cerebral artery is not definitively seen, likely due to artifacts. 5.The vertebrobasilar arterial system is patent. Vanessa ONEIDA Miyamoto, MD 11/26/2023 5:38 PM    MRI Brain WO Contrast  Result Date:  11/26/2023   1.No acute intracranial abnormality. 2.Mild volume loss and mild chronic microangiopathic ischemic changes. Chronic right parietal  infarct, unchanged. 3.Complete opacification of right maxillary sinus. Vanessa ONEIDA Miyamoto, MD 11/26/2023 5:32 PM    Chest  AP Portable  Result Date: 11/26/2023   Cardiomegaly with mild pulmonary edema pattern. [name], MD 11/26/2023 1:42 PM    CTA  Head & Neck  Result Date: 11/26/2023  1.No perfusion deficit suggestive of ischemic penumbra or infarct core. 2.Mildly delayed transit right cerebral hemisphere secondary to chronically occluded right ICA.  3.There is 25% stenosis of the proximal left internal carotid artery based on NASCET criteria. 4.The vertebrobasilar arterial system is patent. 5.Intracranial atherosclerosis without intracranial large vessel occlusion. Vanessa ONEIDA Sewer, MD 11/26/2023 12:43 PM    CT Perfusion Brain  Result Date: 11/26/2023  1.No perfusion deficit suggestive of ischemic penumbra or infarct core. 2.Mildly delayed transit right cerebral hemisphere secondary to chronically occluded right ICA.  3.There is 25% stenosis of the proximal left internal carotid artery based on NASCET criteria. 4.The vertebrobasilar arterial system is patent. 5.Intracranial atherosclerosis without intracranial large vessel occlusion. Vanessa ONEIDA Sewer, MD 11/26/2023 12:43 PM    CT Head WO Contrast  Result Date: 11/26/2023   1.No intracranial hemorrhage or acute intracranial findings. 2.Parenchymal volume loss and chronic small vessel changes. [name], MD 11/26/2023 11:58 AM       History of Present Illness: Vanessa Kelly is a 83 y.o. female admitted on 11/26/2023 with   Problem List[1]     Past Medical/Surgical History  Medical History[2]   Past Surgical History[3]      Subjective   Patient is agreeable to participation in the therapy session. Patient's medical condition is appropriate for Speech therapy intervention at this time.  Patient's Goal:  to eat and drink  Pain: pt denies pain    Objective   Observation of Patient/Vital Signs:  Patient is in bed   Completed bedside swallow evaluation and provided  education regarding swallow, swallow strategies and precautions.  Present at bedside: Daughter and sitter    Orientation: person    Respiratory Status  Current Status  Respiratory Status: within normal limits    Vital Signs  Temp:  [97.5 F (36.4 C)-98.2 F (36.8 C)] 98.2 F (36.8 C)  Heart Rate:  [56-76] 71  Resp Rate:  [16-19] 18  BP: (140-180)/(48-89) 147/67    Oral Motor Skills  Oral Care: Good    Structural Abnormalities: N/A  Dentition: Normal  Oral Sensation: WFL  Mucosal Quality: Clear/moist  Secretion Management: WFL  Additional Comments: N/A    Cranial Nerve Exam  CNV Trigeminal (sensation to face, mandibular movements, chewing):   Intact bilaterally  CNVII Facial (taste to anterior 2/3 tongue, raise eyebrows/wrinkle forehead, smile/pucker, puff cheeks with air):   Intact bilaterally  CNIX Glossopharyngeal (taste to posterior 1/3 tongue, gag response, velar elevation):   Intact bilaterally  CN X Vagus (sensation to larynx/pharynx, cough reflex, laryngeal function, vocal quality):   Appears intact bilaterally  CNXII Hypoglossal (lingual strength, movement, symmetry):   Intact bilaterally  Additional Comments: N/A    Deglutition Skills  Nsg Dysphagia Screen: fail  3 oz water  challenge: Not completed  Position: upright 90 degrees  Food(s) Tested: Ice chips;Thin (IDDSI TN0);Via spoon;Via cup;Via straw;Pureed (IDDSI P4);Easy to chew (IDDSI EC7);Regular (IDDSI RG7)  Oral Stage: adequate  Pharyngeal Stage: adequate  Esophageal Stage: appears [facility]    Signature  Lonell Bray, SLP   11/27/2023  (For scheduling questions,  please contact rehab tech x [phone])         [1]   Patient Active Problem List  Diagnosis    Headache    Mastoiditis    Dizziness    Tremor    Adrenal cortical hypofunction    Anemia    Atherosclerotic heart disease of native coronary artery without angina pectoris    DM2 (diabetes mellitus, type 2) (CMS/HCC)    Essential hypertension    Fall    Gastroesophageal reflux disease without esophagitis     Carotid artery disease    Malignant tumor of breast (CMS/HCC)    Mixed hyperlipidemia    Parkinson's disease (CMS/HCC)    Peripheral vascular disorder due to diabetes mellitus (CMS/HCC)    Stage 3 chronic kidney disease (CMS/HCC)    Unsteadiness on feet    Injury of head, initial encounter    Altered mental status, unspecified   [2]   Past Medical History:  Diagnosis Date    Diabetes mellitus (CMS/HCC)     Gastroesophageal reflux disease     Hyperlipidemia     Hypertension     Hypotension     Malignant tumor of breast (CMS/HCC) 04/13/2016    Seasonal allergic rhinitis     Syncope    [3]   Past Surgical History:  Procedure Laterality Date    BREAST BIOPSY Right     2014    CORONARY ARTERY BYPASS      HYSTERECTOMY      [phone]

## 2023-11-27 NOTE — Progress Notes (Signed)
 11/27/23 1128   Medicare Checklist   Is this a Medicare patient? Yes   Patient received 1st IMM Letter? Yes

## 2023-11-27 NOTE — Plan of Care (Signed)
 Problem: Moderate/High Fall Risk Score >5  Goal: Patient will remain free of falls  Outcome: Progressing  Flowsheets (Taken 11/27/2023 0628)  VH High Risk (Greater than 13):   ALL REQUIRED LOW INTERVENTIONS   PATIENT IS TO BE SUPERVISED FOR ALL TOILETING ACTIVITIES   BED ALARM WILL BE ACTIVATED WHEN THE PATEINT IS IN BED WITH SIGNAGE RESET BED ALARM     Problem: Day of Admission - Stroke  Goal: Core/Quality measure requirements - Admission  Outcome: Progressing  Flowsheets (Taken 11/27/2023 0628)  Core/Quality measure requirements - Admission:   Document NIH Stroke Scale on admission   VTE Prevention: Ensure anticoagulant(s) administered and/or anti-embolism stockings/devices documented as ordered   Document nursing swallow/dysphagia screen on admission. If patient fails, keep patient NPO (follow your hospital protocol on swallowing screening).   Ensure lipid panel ordered   Begin stroke education on admission (must include Modifiable Risk Factors, Warning Signs and Symptoms of Stroke, Activation of Emergency Medical System and Follow-up Appointments) Ensure handout has been given and documented.     Problem: Every Day - Stroke  Goal: Core/Quality measure requirements - Daily  Outcome: Progressing  Flowsheets (Taken 11/27/2023 0628)  Core/Quality measure requirements - Daily:   VTE Prevention: Ensure anticoagulant(s) administered and/or anti-embolism stockings/devices documented by end of day 2   Once lipid panel has resulted, check LDL. Contact provider for statin order if LDL > 70 (or ensure contraindication documented by LIP).   Continue stroke education (must include Modifiable Risk Factors, Warning Signs and Symptoms of Stroke, Activation of Emergency Medical System and Follow-up Appointments). Ensure handout has been given and documented.  Goal: Neurological status is stable or improving  Outcome: Progressing  Flowsheets (Taken 11/27/2023 0628)  Neurological status is stable or improving:    Monitor/assess/document neurological assessment (Stroke: every 4 hours)   Monitor/assess NIH Stroke Scale   Re-assess NIH Stroke Scale for any change in status   Observe for seizure activity and initiate seizure precautions if indicated   Perform CAM Assessment   The learning abilities of the patient and/or caregiver have been assessed. Today 's individualized plan of care includes neuro checks, NIH assessment, VTE prophylaxis and stroke education. The patient or caregiver states the following personal goal related to the patient's deficit(s): "By discharge I want to be able to get better." The plan of care was discussed with the patient and/or caregiver, who agrees to it and demonstrates understanding of the disease process, risk factors, treatment plan, medications and consequences of noncompliance. All questions and concerns were addressed.

## 2023-11-28 DIAGNOSIS — E782 Mixed hyperlipidemia: Secondary | ICD-10-CM

## 2023-11-28 DIAGNOSIS — I10 Essential (primary) hypertension: Secondary | ICD-10-CM

## 2023-11-28 DIAGNOSIS — R410 Disorientation, unspecified: Secondary | ICD-10-CM

## 2023-11-28 DIAGNOSIS — E119 Type 2 diabetes mellitus without complications: Secondary | ICD-10-CM

## 2023-11-28 DIAGNOSIS — E2740 Unspecified adrenocortical insufficiency: Secondary | ICD-10-CM

## 2023-11-28 LAB — ADRENOCORTICOTROPIC HORMONE (ACTH): Adrenocorticotropic hormone (ACTH): 11 pg/mL (ref 6–50)

## 2023-11-28 LAB — WHOLE BLOOD GLUCOSE POCT
Whole Blood Glucose POCT: 132 mg/dL — ABNORMAL HIGH (ref 70–100)
Whole Blood Glucose POCT: 239 mg/dL — ABNORMAL HIGH (ref 70–100)
Whole Blood Glucose POCT: 83 mg/dL (ref 70–100)
Whole Blood Glucose POCT: 200 mg/dL — ABNORMAL HIGH (ref 70–100)

## 2023-11-28 MED ORDER — PANTOPRAZOLE SODIUM 40 MG PO TBEC
40.0000 mg | DELAYED_RELEASE_TABLET | Freq: Every morning | ORAL | Status: DC
Start: 2023-11-29 — End: 2023-11-30
  Administered 2023-11-29 – 2023-11-30 (×2): 40 mg via ORAL
  Filled 2023-11-28 (×2): qty 1

## 2023-11-28 MED ORDER — HYDRALAZINE HCL 25 MG PO TABS
25.0000 mg | ORAL_TABLET | Freq: Three times a day (TID) | ORAL | Status: DC | PRN
Start: 2023-11-28 — End: 2023-11-29

## 2023-11-28 NOTE — PT Progress Note (Addendum)
 [facility]  Physical Therapy Attempt Note    Patient:  Vanessa Kelly MRN#:  [id number]    Unit:  [facility] INTERMEDIATE CARE Room/Bed:  MI621/MI621-02    1130:  PT Cancellation: Visit  PT Visit Cancellation Reason: Pt daughter at bedside politely declines therapy at this time as pt is sleeping; daughter reports pt is finally getting a restful sleep and would not like to disturb that sleep at this time. Daughter reports pt mental status is starting to improve as well; RN confirms.   Daughter reports pt left the subacute rehab facility able to walk with walker; would like to incorporate w/c mobility in sessions so pt is aware how to self-propel w/c in the instance pt isn't well enough to safely walk with walker when discharged (I.e. another instance of pt not feeling well, with low BP, or syncopal episodes causing pt to fall). OT made aware of pt daughter request for deferral at this time as well as goals for therapy sessions. Daughter eager for pt to participate in therapy services after pt able to rest.     PT will follow up with pt as schedule permits.      Addendum 1449:    PT Cancellation: Visit  PT Visit Cancellation Reason: Patient/caregiver declines therapy at this time (Per OT who spoke to pt daughter at bedside, pt sleeping again and daughter would like for pt to continue to rest; daughter politely requests PT/OT follow up tomorrow 7/29. PT will follow up with pt as schedule permits.)           Signature:   Anastasia Sofia, PT, DPT  11/28/2023  11:41 AM       (For scheduling questions, please contact rehab tech x [phone])

## 2023-11-28 NOTE — Progress Notes (Signed)
 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 Discussed with daughter who is stating that her mental status has improved     Review of Systems:    Pt too cognitively or communication impaired to participate in ROS.    Physical Exam:   BP 160/63   Pulse 72   Temp 97.5 F (36.4 C) (Oral)   Resp 17   Ht 1.702 m (5' 7.01)   Wt 79.8 kg (176 lb)   SpO2 100%   BMI 27.56 kg/m     Neuro:  Level of consciousness: Lying in bed fast asleep discussed with the daughter who states that she is doing better as compared to yesterday, because she was tired I did not disturb her  Meds:      Scheduled Meds: PRN Meds:    aspirin , 81 mg, Oral, Daily   Or  aspirin , 300 mg, Rectal, Daily  atorvastatin , 40 mg, Oral, QHS   Or  atorvastatin , 40 mg, per OG tube, QHS   Or  atorvastatin , 40 mg, per NG tube, QHS  enoxaparin , 40 mg, Subcutaneous, Daily  insulin  lispro, 1-3 Units, Subcutaneous, QHS  insulin  lispro, 1-5 Units, Subcutaneous, TID AC  lidocaine , 2 patch, Transdermal, Q24H  [START ON 11/29/2023] pantoprazole , 40 mg, Oral, QAM AC  ranolazine , 500 mg, Oral, BID  venlafaxine , 75 mg, Oral, QHS        Continuous Infusions:   acetaminophen , 650 mg, Q6H PRN  acetaminophen , 325 mg, Q4H PRN  carboxymethylcellulose sodium, 1 drop, TID PRN  dextrose , 15 g of glucose, PRN   Or  dextrose ,  12.5 g, PRN   Or  dextrose , 12.5 g, PRN   Or  glucagon  (rDNA), 1 mg, PRN  hydrALAZINE , 10 mg, Q3H PRN  labetalol , 10 mg, Q15 Min PRN  magnesium  sulfate, 1 g, PRN  melatonin, 3 mg, QHS PRN  naloxone , 0.2 mg, PRN  potassium & sodium phosphates , 2 packet, PRN  potassium chloride , 0-60 mEq, PRN   Or  potassium chloride , 0-60 mEq, PRN   Or  potassium chloride , 10 mEq, PRN  saline, 2 spray, Q4H PRN                Labs:     Recent Labs   Lab 11/27/23  0621 11/26/23  1232   Glucose 97 84   BUN 20 23*   Creatinine 1.4* 1.5*   Calcium  9.6 9.5   Sodium 138 130*   Potassium 4.6 5.4*   Chloride 106 101   CO2 22 24   Albumin  --  3.2*   AST (SGOT)  --  25   ALT  --  32   Bilirubin, Total  --  0.3   Alkaline Phosphatase  --  83     Recent Labs   Lab 11/27/23  0621 11/26/23  1232   WBC 5.15 7.31   Hemoglobin 9.0* 9.0*   Hematocrit 26.6* 27.0*   MCV 97.8* 98.9*   MCH 33.1 33.0   MCHC 33.8 33.3   Platelet Count 188 202                 Lab Results   Component Value Date    TSH 1.31 11/27/2023               .ll    Recent Labs     11/26/23  1232   PTT 26*   PT 11.8   INR 1.0        No results found.     All brain imaging (MRI, CT) personally reviewed.    Case discussed with: Patient's daughter at the bedside      TTS--35 minutes;  involving time spent examining patient, in counseling or coordination of care, reviewing test results, and in documentation.        I personally reviewed all of the medications.  Medication list generated using all available resources.  Elder abuse (physical)  - negative  Advanced care plan - reviewed from chart or in discussion with pt or family        This note was generated by the Epic EMR system/ Dragon speech recognition and may contain inherent errors or omissions not intended by the user. Grammatical errors, random word insertions, deletions and pronoun errors  are occasional consequences of this technology due to software limitations.   Not all errors are caught or corrected. If there are questions or  concerns about the content of this note or information contained within the body of this dictation they should be addressed directly with the author for clarification.      Signed by: Reese GORMAN Erps, MD, MD  Spectralink: [id number]      Answering Service: [phone]

## 2023-11-28 NOTE — Plan of Care (Signed)
 Trio rounding template   11/28/2023    - Brief  background -Vanessa Kelly is a 83 y.o. female with AMS, worsened from baseline per family report. 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 Hx DM, HTN, GERD, HLD, R breast CA with sx, syncope, coronary artery bypass, hysterectomy.     -Last 24 Hrs (any critical events ,abnormal VS, treatment, trend ):   HTN, tele NSR, sitter bedside for AMS/restlessness/getting OOB.     -Respiratory (changes in oxygenation): RA  -Cardiac Rhythm - if changes-  NSR, HTN  - Weight trend if applicable - 79.8 kg  -GI/GU (changes in out put, BM, Foley): ex cath, LBM PTA  -Pain:  -Nutrition (diabetic, NPO): regular diet, pills whole, thin liquids ok  -Activity / Safety/ mobility  questions (PMP, need for PT/ OT , sitter) : x1 walker, bed mobility x2    -Upcoming Procedures :  see below  -Discharge Date/ Plans:    Will check B12 folate TSH levels  Wait for another 24 to 36-hour for side effects to subside  If patient does not get better, or worsens, may need to consider getting LP, but at this point, given only minimal headache and no neck stiffness, yield is going to be low    -Questions/ orders needed  n/a    Note : Night shift to complete prior to Trio rounding   Jon Shaggy, RN  Problem: Moderate/High Fall Risk Score >5  Goal: Patient will remain free of falls  Outcome: Progressing     Problem: Day of Admission - Stroke  Goal: Core/Quality measure requirements - Admission  Outcome: Progressing     Problem: Every Day - Stroke  Goal: Core/Quality measure requirements - Daily  Outcome: Progressing  Goal: Neurological status is stable or improving  Outcome: Progressing  Goal: Stable vital signs and fluid balance  Outcome: Progressing  Goal: Patient will maintain adequate oxygenation  Outcome: Progressing  Goal: Patient's risk of  aspiration will be minimized  Outcome: Progressing  Goal: Nutritional intake is adequate  Outcome: Progressing  Goal: Elimination patterns are normal or improving  Outcome: Progressing  Goal: Mobility/Activity is maintained at optimal level for patient  Outcome: Progressing  Goal: Skin integrity is maintained or improved  Outcome: Progressing  Goal: Neurovascular status is stable or improving  Outcome: Progressing  Goal: Effective coping demonstrated  Outcome: Progressing  Goal: Will be able to express needs and understand communication  Outcome: Progressing     Problem: Compromised Sensory Perception  Goal: Sensory Perception Interventions  Outcome: Progressing     Problem: Compromised Moisture  Goal: Moisture level Interventions  Outcome: Progressing     Problem: Compromised Activity/Mobility  Goal: Activity/Mobility Interventions  Outcome: Progressing     Problem: Compromised Nutrition  Goal: Nutrition Interventions  Outcome: Progressing     Problem: Compromised Friction/Shear  Goal: Friction and Shear Interventions  Outcome: Progressing     Problem: Day of Discharge - Stroke  Goal: Able to express understanding of discharge instructions and stroke education  Outcome: Progressing  Goal: Core/Quality measures - Discharge  Outcome: Progressing

## 2023-11-28 NOTE — Plan of Care (Signed)
 Problem: Moderate/High Fall Risk Score >5  Goal: Patient will remain free of falls  Outcome: Progressing     Problem: Day of Admission - Stroke  Goal: Core/Quality measure requirements - Admission  Outcome: Progressing     Problem: Every Day - Stroke  Goal: Core/Quality measure requirements - Daily  Outcome: Progressing  Goal: Neurological status is stable or improving  Outcome: Progressing  Goal: Stable vital signs and fluid balance  Outcome: Progressing  Goal: Patient will maintain adequate oxygenation  Outcome: Progressing  Goal: Patient's risk of aspiration will be minimized  Outcome: Progressing  Goal: Nutritional intake is adequate  Outcome: Progressing  Goal: Elimination patterns are normal or improving  Outcome: Progressing  Goal: Mobility/Activity is maintained at optimal level for patient  Outcome: Progressing  Goal: Skin integrity is maintained or improved  Outcome: Progressing  Goal: Neurovascular status is stable or improving  Outcome: Progressing  Goal: Effective coping demonstrated  Outcome: Progressing  Goal: Will be able to express needs and understand communication  Outcome: Progressing     Problem: Compromised Sensory Perception  Goal: Sensory Perception Interventions  Outcome: Progressing     Problem: Compromised Moisture  Goal: Moisture level Interventions  Outcome: Progressing     Problem: Compromised Activity/Mobility  Goal: Activity/Mobility Interventions  Outcome: Progressing     Problem: Compromised Nutrition  Goal: Nutrition Interventions  Outcome: Progressing     Problem: Compromised Friction/Shear  Goal: Friction and Shear Interventions  Outcome: Progressing     Problem: Day of Discharge - Stroke  Goal: Able to express understanding of discharge instructions and stroke education  Outcome: Progressing  Goal: Core/Quality measures - Discharge  Outcome: Progressing     Problem: Safety  Goal: Patient will be free from injury during hospitalization  Outcome: Progressing  Goal: Patient will  be free from infection during hospitalization  Outcome: Progressing

## 2023-11-28 NOTE — Consults (Addendum)
 Case Management location: remote      Case Management Progress Note     Date:     Patient: Vanessa Kelly,Vanessa Kelly    Active Hospital Problems    Diagnosis    Altered mental status, unspecified       Length of stay: Hospital Day 2      Discharge plan: TBD; pending therapy evals/recs; anticipate SNF vs. Return to ALF with [facility]    Transport: Medical transport    Anticipated date of discharge: within next 24 hours    Barriers to discharge: Pending therapy evals/recs; medical stability    - Patient was recently at [facility] (from [facility] discharge 10/19/23); patient only home one week before return to the hospital.     CM will continue to follow for discharge planning needs.    Rosaline Rubens, BSN RN  Case Manager  [facility]

## 2023-11-28 NOTE — Progress Notes (Signed)
 [facility]  Internal Medicine Hospitalists  Progress Note        Assessment / Plan:      Vanessa Kelly is a 83 y.o. female with a  diabetes, GERD, hyperlipidemia, hypertension, hypotension, insomnia, orthostatic hypotension, coronary artery disease CABG x 4, hyperlipidemia, PVD, muscle weakness, cerebrovascular disease, asthma, parkinsonism, CKD, fracture of medial wall of orbit June 2025, frequent falls.  Recently hospitalized after fall orbital fracture and was discharged to rehab facility has been home 1 week.        Arrives to ER with altered mental status last known normal 1030pm per ED note upon arrival was able to answer questions EMS reported right facial droop patient also with complaint of dizziness headache.  In the ED, CT head no CVA, CTA noted chronic right ICA occlusion..  Troponin x 1 negative, blood glucose on arrival 111.  Urinalysis no leukocyte no WBC no nitrite.   Patient evaluated at bedside daughter [name] is also at bedside.  Daughter reports that she speaks to patient in the evening via FaceTime and yesterday evening patient was her usual.  Daughter did noticed that patient was using a walker and was advised to only use wheelchair and bedside commode if needed.  Today  speaking to patient at 1030 she reported that patient was making nonsensical statements.  Did not seem herself daughter was concerned that maybe this was a UTI as she has had prior and appeared this way.  Last known patient norm was yesterday at 10:30 PM.        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  # Hyponatremia resolved  # Hyperkalemia resolved    - Scr 1.5->1.4  - base line Scr  ~ 1.1  - normal B12 and folate in June  - morning cortisol 9.8    #Excessive sleepiness  #Frequent falls  #Recent R orbital fracture 10/2023  - cont PT/OT  - no signs of infection    # Anemia chronic  H/H 9/26.9--> 10.5/31.2--> 9.0/27--> 9.0/26.6 stable no gross bleed  Iron studies June, iron saturation 47 ferritin 44 folate 14 B12 500       # Hypertensive urgency on admission in setting of known hypertension  #CAD -CABG (2002), stents (2006)   #Cardiomegaly (7/27 CXR with mild pulm edema)  #HLD  - has labile BP per family  - CXR - cardiomegaly, mild pulmonary edema  - Echo-( 10/15/2023) - EF 69%, mild concentric left ventricular remodeling, left ventricular diastolic function indeterminate, mild aortic valve sclerosis  - on prn dose of Candesartan  and uses midodrine  if low  - c/w statin,ASA  - c/w Ranexa    - can  use prn hydralazine     #Orthostatic hypotension  - c/w midodrine  as needed  - elevated supine BP  - follow orthostatic vitals    # Type 2 DM  - check sugars before meals,at bedtime and prn  - goal CBG 140-180  in hospital  - SSI while in hospital    # GERD   - c/w PPI daily    # s/p Right TKA    # Depression  - cont home Venlafaxine     # DVT ppx - Lovenox ,SCD     Code status NO CPR with support          Subjective:    Patient seen and examined. Aox3, remembered being confused yesterday  Eating and drinking well  No urinary  complaint  No dizziness/cp/sob       Objective:      Temp:  [97.2 F (36.2 C)-98.6 F (37 C)] 97.5 F (36.4 C)  Heart Rate:  [54-75] 54  Resp Rate:  [16-18] 17  BP: (147-188)/(66-89) 188/72         Vital Signs and Nursing notes reviewed  General: awake; No acute distress.  Cardiovascular: Normal S1 and S2, no murmurs  Lungs: clear to auscultation bilaterally, no distress  Abdomen: soft, non-tender, non-distended; +BS  Extremities: no edema or tenderness  Neuro: alert, oriented x 3, moves all extremities,no facial asymmetry          Meds:     Current Facility-Administered Medications[1]    Labs and Radiology:     Recent Labs   Lab 11/27/23  0621   WBC 5.15   Hemoglobin 9.0*   Hematocrit 26.6*   Platelet Count 188      Recent Labs   Lab 11/27/23  0621   Sodium 138   Potassium 4.6   Chloride 106   CO2 22   BUN 20   Creatinine 1.4*   GFR 36.4*   Glucose 97   Calcium  9.6      Recent Labs   Lab 11/26/23  1232   Bilirubin, Total 0.3   Protein, Total 6.4   Albumin 3.2*   ALT 32   AST (SGOT) 25    MR Angiogram Head WO Contrast  Result Date: 11/26/2023  1.Motion degraded exam. 2.Occlusion of the right internal carotid artery, with reconstitution of the carotid terminus, from collaterals. 3.Motion artifact limits evaluation of stenosis in the proximal left internal carotid artery. The more distal cervical left internal carotid artery is patent. 4.Motion artifact limits evaluation of the intracranial arteries. The left anterior cerebral artery is not definitively seen, likely due to artifacts. 5.The vertebrobasilar arterial system is patent. Oakley ONEIDA Miyamoto, MD 11/26/2023 5:38 PM    MR Angiogram Neck WO Contrast  Result Date: 11/26/2023  1.Motion degraded exam. 2.Occlusion of the right internal carotid artery, with reconstitution of the carotid terminus, from collaterals. 3.Motion artifact limits evaluation of stenosis in the proximal left internal carotid artery. The more distal cervical left internal carotid artery is  patent. 4.Motion artifact limits evaluation of the intracranial arteries. The left anterior cerebral artery is not definitively seen, likely due to artifacts. 5.The vertebrobasilar arterial system is patent. Oakley ONEIDA Miyamoto, MD 11/26/2023 5:38 PM    MRI Brain WO Contrast  Result Date: 11/26/2023   1.No acute intracranial abnormality. 2.Mild volume loss and mild chronic microangiopathic ischemic changes. Chronic right parietal infarct, unchanged. 3.Complete opacification of right maxillary sinus. Oakley ONEIDA Miyamoto, MD 11/26/2023 5:32 PM    Chest  AP Portable  Result Date: 11/26/2023   Cardiomegaly with mild pulmonary edema pattern. [name], MD 11/26/2023 1:42 PM    CTA  Head & Neck  Result Date: 11/26/2023  1.No perfusion deficit suggestive of ischemic penumbra or infarct core. 2.Mildly delayed transit right cerebral hemisphere secondary to chronically occluded right ICA.  3.There is 25% stenosis of the proximal left internal carotid artery based on NASCET criteria. 4.The vertebrobasilar arterial system is patent. 5.Intracranial atherosclerosis without intracranial large vessel occlusion. [name], MD 11/26/2023 12:43 PM    CT Perfusion Brain  Result Date: 11/26/2023  1.No perfusion deficit suggestive of ischemic penumbra or infarct core. 2.Mildly delayed transit right cerebral hemisphere secondary to chronically occluded right ICA.  3.There is 25% stenosis of the proximal left internal carotid artery based on NASCET criteria. 4.The vertebrobasilar arterial system is patent. 5.Intracranial atherosclerosis without intracranial large vessel occlusion. [name], MD 11/26/2023 12:43 PM    CT Head WO Contrast  Result Date: 11/26/2023   1.No intracranial hemorrhage or acute intracranial findings. 2.Parenchymal volume loss and chronic small vessel changes. [name], MD 11/26/2023 11:58 AM    CT Head WO Contrast  Result Date: 11/14/2023   1.No acute intracranial hemorrhage or mass effect. 2.Mild chronic  appearing small vessel ischemic changes. Reyes Argyle, MD 11/14/2023 3:14 PM                  [1]   Current Facility-Administered Medications   Medication Dose Route Frequency    aspirin   81 mg Oral Daily    atorvastatin   40 mg Oral QHS    enoxaparin   40 mg Subcutaneous Daily    insulin  lispro  1-3 Units Subcutaneous QHS    insulin  lispro  1-5 Units Subcutaneous TID AC    lidocaine   2 patch Transdermal Q24H    [START ON 11/29/2023] pantoprazole   40 mg Oral QAM AC    ranolazine   500 mg Oral BID    venlafaxine   75 mg Oral QHS

## 2023-11-28 NOTE — OT Progress Note (Addendum)
 [facility]  Occupational Therapy Attempt Note    Patient:  Vanessa Kelly MRN#:  [id number]    Unit:  [facility] INTERMEDIATE CARE Room/Bed:  MI621/MI621-02      OT Cancellation: Visit  OT Visit Cancellation Reason: Other (comment required) (per PT, daughter requesting to follow up later as she would like her mother to rest. Will continue to follow.)       Second Attempt:  OT eval attempted in PM; however, the patient's daughter reported that the patient was sleeping and experiencing her first restful sleep. She requested that OT/PT follow up tomorrow, 7/30. Will continue to follow.     Signature:   Tomeka Kantner, OT  11/28/2023  1:24 PM    (For scheduling questions, please contact rehab tech x [phone])

## 2023-11-28 NOTE — Plan of Care (Signed)
 Problem: Moderate/High Fall Risk Score >5  Goal: Patient will remain free of falls  11/28/2023 1103 by [name], RN  Outcome: Progressing  Flowsheets (Taken 11/28/2023 1102)  High (Greater than 13):   HIGH-Bed alarm on at all times while patient in bed   HIGH-Apply yellow Fall Risk arm band   MOD-Perform dangle, stand, walk (DSW) prior to mobilization  11/28/2023 1102 by [name], RN  Outcome: Progressing  Flowsheets (Taken 11/28/2023 1102)  High (Greater than 13):   HIGH-Bed alarm on at all times while patient in bed   HIGH-Apply yellow Fall Risk arm band   MOD-Perform dangle, stand, walk (DSW) prior to mobilization     Problem: Compromised Friction/Shear  Goal: Friction and Shear Interventions  Outcome: Progressing  Flowsheets (Taken 11/28/2023 1103)  Friction and Shear Interventions: Pad bony prominences, Off load heels, HOB 30 degrees or less unless contraindicated, Consider: TAP seated positioning, Heel foams     Problem: Safety  Goal: Patient will be free from injury during hospitalization  Outcome: Progressing  Flowsheets (Taken 11/28/2023 1103)  Patient will be free from injury during hospitalization:   Assess patient's risk for falls and implement fall prevention plan of care per policy   Use appropriate transfer methods   Ensure appropriate safety devices are available at the bedside   Provide and maintain safe environment   Hourly rounding   Include patient/ family/ care giver in decisions related to safety   Assess for patients risk for elopement and implement Elopement Risk Plan per policy   Provide alternative method of communication if needed (communication boards, writing)  Goal: Patient will be free from infection during hospitalization  Outcome: Progressing  Flowsheets (Taken 11/28/2023 1103)  Free from Infection during hospitalization:   Assess and monitor for signs and symptoms of infection   Monitor all insertion sites (i.e. indwelling lines, tubes, urinary catheters, and  drains)   Monitor lab/diagnostic results   Encourage patient and family to use good hand hygiene technique

## 2023-11-29 DIAGNOSIS — N183 Chronic kidney disease, stage 3 unspecified: Secondary | ICD-10-CM

## 2023-11-29 DIAGNOSIS — R4182 Altered mental status, unspecified: Secondary | ICD-10-CM

## 2023-11-29 DIAGNOSIS — I951 Orthostatic hypotension: Secondary | ICD-10-CM

## 2023-11-29 DIAGNOSIS — D539 Nutritional anemia, unspecified: Secondary | ICD-10-CM

## 2023-11-29 LAB — LAB USE ONLY - CBC WITH DIFFERENTIAL
Absolute Basophils: 0.05 x10 3/uL (ref 0.00–0.08)
Absolute Eosinophils: 0.43 x10 3/uL (ref 0.00–0.44)
Absolute Immature Granulocytes: 0.02 x10 3/uL (ref 0.00–0.07)
Absolute Lymphocytes: 1.79 x10 3/uL (ref 0.42–3.22)
Absolute Monocytes: 0.56 x10 3/uL (ref 0.21–0.85)
Absolute Neutrophils: 2.73 x10 3/uL (ref 1.10–6.33)
Absolute nRBC: 0 x10 3/uL (ref <=0.00)
Basophils %: 0.9 %
Eosinophils %: 7.7 %
Hematocrit: 29.1 % — ABNORMAL LOW (ref 34.7–43.7)
Hemoglobin: 9.7 g/dL — ABNORMAL LOW (ref 11.4–14.8)
Immature Granulocytes %: 0.4 %
Lymphocytes %: 32.1 %
MCH: 33.1 pg (ref 25.1–33.5)
MCHC: 33.3 g/dL (ref 31.5–35.8)
MCV: 99.3 fL — ABNORMAL HIGH (ref 78.0–96.0)
MPV: 10.2 fL (ref 8.9–12.5)
Monocytes %: 10 %
Neutrophils %: 48.9 %
Platelet Count: 192 x10 3/uL (ref 142–346)
Preliminary Absolute Neutrophil Count: 2.73 x10 3/uL (ref 1.10–6.33)
RBC: 2.93 x10 6/uL — ABNORMAL LOW (ref 3.90–5.10)
RDW: 12 % (ref 11–15)
WBC: 5.58 x10 3/uL (ref 3.10–9.50)
nRBC %: 0 /100{WBCs} (ref <=0.0)

## 2023-11-29 LAB — BASIC METABOLIC PANEL
Anion Gap: 7 (ref 5.0–15.0)
BUN: 19 mg/dL (ref 7–21)
CO2: 24 meq/L (ref 17–29)
Calcium: 9.8 mg/dL (ref 7.9–10.2)
Chloride: 107 meq/L (ref 99–111)
Creatinine: 1.4 mg/dL — ABNORMAL HIGH (ref 0.4–1.0)
GFR: 36.4 mL/min/1.73 m2 — ABNORMAL LOW (ref >=60.0)
Glucose: 93 mg/dL (ref 70–100)
Potassium: 4.8 meq/L (ref 3.5–5.3)
Sodium: 138 meq/L (ref 135–145)

## 2023-11-29 LAB — WHOLE BLOOD GLUCOSE POCT
Whole Blood Glucose POCT: 115 mg/dL — ABNORMAL HIGH (ref 70–100)
Whole Blood Glucose POCT: 158 mg/dL — ABNORMAL HIGH (ref 70–100)
Whole Blood Glucose POCT: 171 mg/dL — ABNORMAL HIGH (ref 70–100)
Whole Blood Glucose POCT: 171 mg/dL — ABNORMAL HIGH (ref 70–100)
Whole Blood Glucose POCT: 83 mg/dL (ref 70–100)
Whole Blood Glucose POCT: 97 mg/dL (ref 70–100)

## 2023-11-29 MED ORDER — HYDRALAZINE HCL 25 MG PO TABS
25.0000 mg | ORAL_TABLET | Freq: Three times a day (TID) | ORAL | Status: DC | PRN
Start: 2023-11-29 — End: 2023-11-30

## 2023-11-29 MED ORDER — MIDODRINE HCL 5 MG PO TABS
2.5000 mg | ORAL_TABLET | Freq: Three times a day (TID) | ORAL | Status: DC | PRN
Start: 2023-11-29 — End: 2023-11-30

## 2023-11-29 MED ORDER — ASPIRIN 81 MG PO TBEC
81.0000 mg | DELAYED_RELEASE_TABLET | Freq: Every day | ORAL | Status: DC
Start: 2023-11-30 — End: 2023-11-30
  Administered 2023-11-30: 81 mg via ORAL
  Filled 2023-11-29: qty 1

## 2023-11-29 NOTE — Plan of Care (Signed)
 Problem: Moderate/High Fall Risk Score >5  Goal: Patient will remain free of falls  Outcome: Progressing  Flowsheets (Taken 11/28/2023 1102 by [name], RN)  High (Greater than 13):   HIGH-Bed alarm on at all times while patient in bed   HIGH-Apply yellow Fall Risk arm band   MOD-Perform dangle, stand, walk (DSW) prior to mobilization     Problem: Day of Admission - Stroke  Goal: Core/Quality measure requirements - Admission  Outcome: Progressing  Flowsheets (Taken 11/27/2023 0628 by [name], RN)  Core/Quality measure requirements - Admission:   Document NIH Stroke Scale on admission   VTE Prevention: Ensure anticoagulant(s) administered and/or anti-embolism stockings/devices documented as ordered   Document nursing swallow/dysphagia screen on admission. If patient fails, keep patient NPO (follow your hospital protocol on swallowing screening).   Ensure lipid panel ordered   Begin stroke education on admission (must include Modifiable Risk Factors, Warning Signs and Symptoms of Stroke, Activation of Emergency Medical System and Follow-up Appointments) Ensure handout has been given and documented.     Problem: Every Day - Stroke  Goal: Core/Quality measure requirements - Daily  Outcome: Progressing  Flowsheets (Taken 11/27/2023 1245 by [name], RN)  Core/Quality measure requirements - Daily:   VTE Prevention: Ensure anticoagulant(s) administered and/or anti-embolism stockings/devices documented by end of day 2   Ensure antithrombotic administered or contraindication documented by LIP by end of day 2   Once lipid panel has resulted, check LDL. Contact provider for statin order if LDL > 70 (or ensure contraindication documented by LIP).   Continue stroke education (must include Modifiable Risk Factors, Warning Signs and Symptoms of Stroke, Activation of Emergency Medical System and Follow-up Appointments). Ensure handout has been given and documented.  Goal: Neurological status is stable or  improving  Outcome: Progressing  Flowsheets (Taken 11/27/2023 1245 by [name], RN)  Neurological status is stable or improving:   Monitor/assess/document neurological assessment (Stroke: every 4 hours)   Monitor/assess NIH Stroke Scale   Re-assess NIH Stroke Scale for any change in status   Observe for seizure activity and initiate seizure precautions if indicated   Perform CAM Assessment  Goal: Stable vital signs and fluid balance  Outcome: Progressing  Flowsheets (Taken 11/27/2023 1245 by [name], RN)  Stable vital signs and fluid balance:   Monitor and assess vitals every 4 hours or as ordered and hemodynamic parameters   Apply telemetry monitor as ordered  Goal: Patient will maintain adequate oxygenation  Outcome: Progressing  Goal: Patient's risk of aspiration will be minimized  Outcome: Progressing  Flowsheets (Taken 11/27/2023 1245 by [name], RN)  Patient's risk of aspiration will be minimized:   Monitor/assess for signs of aspiration (tachypnea, cough, wheezing, clearing throat, hoarseness after eating, decrease in SaO2   Assess and monitor ability to swallow   Keep head of bed up a minimum of 30 degrees when hemodynamically stable  Goal: Nutritional intake is adequate  Outcome: Progressing  Flowsheets (Taken 11/29/2023 1110)  Nutritional intake is adequate: Consult/collaborate with Clinical Nutritionist  Goal: Elimination patterns are normal or improving  Outcome: Progressing  Goal: Mobility/Activity is maintained at optimal level for patient  Outcome: Progressing  Goal: Skin integrity is maintained or improved  Outcome: Progressing  Flowsheets (Taken 11/29/2023 1110)  Skin integrity is maintained or improved:   Assess Braden Scale every shift   Relieve pressure to bony prominences   Increase activity as tolerated/progressive mobility   Keep skin clean and dry  Goal: Neurovascular status is  stable or improving  Outcome: Progressing  Flowsheets (Taken 11/29/2023 1110)  Neurovascular  status is stable or improving: Monitor/assess neurovascular status (pulses, capillary refill, pain, paresthesia, presence of edema)  Goal: Effective coping demonstrated  Outcome: Progressing  Goal: Will be able to express needs and understand communication  Outcome: Progressing     Problem: Compromised Sensory Perception  Goal: Sensory Perception Interventions  Outcome: Progressing  Flowsheets (Taken 11/29/2023 0754)  Sensory Perception Interventions: Offload heels, Pad bony prominences, Reposition q 2hrs/turn Clock, Q2 hour skin assessment under devices if present     Problem: Compromised Moisture  Goal: Moisture level Interventions  Outcome: Progressing     Problem: Compromised Activity/Mobility  Goal: Activity/Mobility Interventions  Outcome: Progressing     Problem: Compromised Nutrition  Goal: Nutrition Interventions  Outcome: Progressing     Problem: Compromised Friction/Shear  Goal: Friction and Shear Interventions  Outcome: Progressing     Problem: Day of Discharge - Stroke  Goal: Able to express understanding of discharge instructions and stroke education  Outcome: Progressing  Goal: Core/Quality measures - Discharge  Outcome: Progressing     Problem: Safety  Goal: Patient will be free from injury during hospitalization  Outcome: Progressing  Flowsheets (Taken 11/28/2023 1103 by [name], RN)  Patient will be free from injury during hospitalization:   Assess patient's risk for falls and implement fall prevention plan of care per policy   Use appropriate transfer methods   Ensure appropriate safety devices are available at the bedside   Provide and maintain safe environment   Hourly rounding   Include patient/ family/ care giver in decisions related to safety   Assess for patients risk for elopement and implement Elopement Risk Plan per policy   Provide alternative method of communication if needed (communication boards, writing)  Goal: Patient will be free from infection during  hospitalization  Outcome: Progressing  Flowsheets (Taken 11/28/2023 1103 by [name], RN)  Free from Infection during hospitalization:   Assess and monitor for signs and symptoms of infection   Monitor all insertion sites (i.e. indwelling lines, tubes, urinary catheters, and drains)   Monitor lab/diagnostic results   Encourage patient and family to use good hand hygiene technique

## 2023-11-29 NOTE — Progress Notes (Signed)
 [facility]  Internal Medicine Hospitalists  Progress Note        Assessment / Plan:       83 y.o. female with a  diabetes, GERD, hyperlipidemia, hypertension, hypotension, insomnia, orthostatic hypotension, coronary artery disease CABG x 4, hyperlipidemia, PVD, muscle weakness, cerebrovascular disease, asthma, parkinsonism, CKD, fracture of medial wall of orbit June 2025, frequent falls.  Recently hospitalized after fall orbital fracture and was discharged to rehab facility has been home 1 week.   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 Consider inpatient vascular evaluation given chronically occluded R ICA if MRI shows signs of stroke or concern for TIA  - Neurology input appreciated  - Continue off of scopolamine  patch, noted with improvement.      # Chronic kidney disease stage III--suspect baseline creatinine 1.3-1.5; suspect no AKI in hospital  # Hyponatremia--resolved and low sodium of 130 on admission 7/27  # Hyperkalemia--resolved and high potassium of 5.4 on admission on 7/27  -Renal function electrolytes currently appearing stable, repeat hemoglobin     #Excessive sleepiness--improved  #Frequent falls  #Recent R orbital fracture 10/2023  - cont PT/OT  - no signs of infection  -As above, suspected secondary to medication/scopolamine  patch     # Macrocytic  anemia chronic  -Noted 10/15/2023 with iron sat 47, ferritin 44, normal folate level, normal B12 level 500  -Hemoglobin stable      # Hypertensive urgency on admission in setting of known hypertension but also history of orthostatic hypotension  #CAD -CABG (2002), stents (2006)   #Cardiomegaly (7/27 CXR with mild pulm edema)  #HLD  #Orthostatic hypotension  - has labile BP per family  - CXR - cardiomegaly, mild pulmonary edema  - Echo-( 10/15/2023) - EF 69%, mild concentric left ventricular remodeling, left ventricular diastolic function indeterminate, mild aortic valve sclerosis  - on prn dose of Candesartan  and uses PRN midodrine  if low  - c/w statin, ASA  - c/w Ranexa    - Add TED hose stockings and abdominal binder  - Continue PRN midodrine  for low BP and PRN hydralazine  if SBP greater than 180     #Orthostatic hypotension  - c/w midodrine  as needed  - elevated supine BP  - follow orthostatic vitals     # Type 2 DM  - Continue insulin  sliding scale, noted with fair glucose control     # GERD   - c/w PPI daily    Plan of care discussed with patient and updated daughter/Erica over phone while at bedside     VTE Prophylaxis: enoxaparin  (LOVENOX ) syringe 40 mg  Place sequential compression device     Foley Catheter: No Foley Present    Venous Access: No Temporary Central Line Present    Medical Readiness for Discharge: possibly 1 day if mental status stable/improved, orthostatic BP improved/stable      Open Handoff Activity  in Sidebar  Subjective:      Patient seen and examined.  Patient feeling better overall from admission but does report some difficulties with therapy as she was noted with significant orthostatic changes including systolic blood pressure dropping in the 70s.     Objective:      Temp:  [97.5 F (36.4 C)-99 F (37.2 C)] 97.5 F (36.4 C)  Heart Rate:  [55-77] 77  Resp Rate:  [14-18] 18  BP: (75-169)/(48-80) 101/59   General: Alert and awake, no distress  CVS: Normal S1 and S2, gallop or rub  Lungs:  Clear to auscultation bilaterally, no rhonchi or wheezing  Abdomen: Soft, nondistended  Extremities: No pedal edema       Labs/Radiology:   Imaging personally reviewed, including: all available   No results found.  Recent Labs     11/29/23  1153 11/29/23  0805 11/29/23  0422 11/29/23  0025 11/28/23  2031 11/28/23  1722   Whole Blood Glucose POCT 171* 115* 97 83 200* 83     Recent Labs   Lab 11/29/23  0458 11/27/23  0621 11/26/23  1232   Sodium 138 138 130*   Potassium 4.8 4.6 5.4*   Chloride 107 106 101   BUN 19 20 23*   Creatinine 1.4* 1.4* 1.5*   GFR 36.4* 36.4* 33.5*   Glucose 93 97 84   Calcium  9.8 9.6 9.5     Recent Labs   Lab 11/29/23  0458 11/27/23  0621 11/26/23  1232   WBC 5.58 5.15 7.31   Hemoglobin 9.7* 9.0* 9.0*   Hematocrit 29.1* 26.6* 27.0*   Platelet Count 192 188 202     Recent Labs   Lab 11/26/23  1232   PT 11.8   INR 1.0   PTT 26*     Recent Labs   Lab 11/26/23  1232   Alkaline Phosphatase 83   Bilirubin, Total 0.3   ALT 32   AST (SGOT) 25             [place] Pager: [phone]  Signed by: Dorn CHRISTELLA Kubas, MD

## 2023-11-29 NOTE — Plan of Care (Signed)
 Problem: Moderate/High Fall Risk Score >5  Goal: Patient will remain free of falls  11/29/2023 2236 by Vanessa Innocent, RN  Outcome: Progressing  Flowsheets (Taken 11/29/2023 2236)  High (Greater than 13):   HIGH-Apply yellow Fall Risk arm band   HIGH-Bed alarm on at all times while patient in bed   HIGH-Initiate use of floor mats as appropriate  11/29/2023 2234 by Vanessa Innocent, RN  Outcome: Progressing  Flowsheets (Taken 11/29/2023 2234)  High (Greater than 13):   HIGH-Apply yellow Fall Risk arm band   HIGH-Bed alarm on at all times while patient in bed   HIGH-Visual cue at entrance to patient's room     Problem: Compromised Moisture  Goal: Moisture level Interventions  Outcome: Progressing  Flowsheets (Taken 11/29/2023 2234)  Moisture level Interventions: Moisture wicking products, Moisture barrier cream     Problem: Compromised Activity/Mobility  Goal: Activity/Mobility Interventions  11/29/2023 2236 by Vanessa Innocent, RN  Outcome: Progressing  Flowsheets (Taken 11/29/2023 2236)  Activity/Mobility Interventions: Pad bony prominences, TAP Seated positioning system when OOB, Promote PMP, Reposition q 2 hrs / turn clock, Offload heels  11/29/2023 2234 by Vanessa Innocent, RN  Outcome: Progressing  Flowsheets (Taken 11/29/2023 2234)  Activity/Mobility Interventions: Pad bony prominences, TAP Seated positioning system when OOB, Promote PMP, Reposition q 2 hrs / turn clock, Offload heels     Problem: Compromised Nutrition  Goal: Nutrition Interventions  11/29/2023 2236 by Vanessa Innocent, RN  Outcome: Progressing  Flowsheets (Taken 11/29/2023 2236)  Nutrition Interventions: Discuss nutrition at Rounds, I&Os, Document % meal eaten, Daily weights  11/29/2023 2234 by Vanessa Innocent, RN  Outcome: Progressing  Flowsheets (Taken 11/29/2023 2234)  Nutrition Interventions: Discuss nutrition at Rounds, I&Os, Document % meal eaten, Daily weights     Problem: Compromised Friction/Shear  Goal: Friction and Shear Interventions  11/29/2023 2236 by Vanessa Innocent,  RN  Outcome: Progressing  Flowsheets (Taken 11/29/2023 2236)  Friction and Shear Interventions: Pad bony prominences, Off load heels, HOB 30 degrees or less unless contraindicated, Consider: TAP seated positioning, Heel foams  11/29/2023 2234 by Vanessa Innocent, RN  Outcome: Progressing  Flowsheets (Taken 11/29/2023 2234)  Friction and Shear Interventions: Pad bony prominences, Off load heels, HOB 30 degrees or less unless contraindicated, Consider: TAP seated positioning, Heel foams     Problem: Safety  Goal: Patient will be free from injury during hospitalization  11/29/2023 2236 by Vanessa Innocent, RN  Outcome: Progressing  Flowsheets (Taken 11/29/2023 2236)  Patient will be free from injury during hospitalization:   Assess patient's risk for falls and implement fall prevention plan of care per policy   Use appropriate transfer methods   Include patient/ family/ care giver in decisions related to safety   Assess for patients risk for elopement and implement Elopement Risk Plan per policy   Provide alternative method of communication if needed (communication boards, writing)  11/29/2023 2234 by Vanessa Innocent, RN  Outcome: Progressing  Flowsheets (Taken 11/29/2023 2234)  Patient will be free from injury during hospitalization:   Assess patient's risk for falls and implement fall prevention plan of care per policy   Use appropriate transfer methods   Include patient/ family/ care giver in decisions related to safety   Assess for patients risk for elopement and implement Elopement Risk Plan per policy   Provide alternative method of communication if needed (communication boards, writing)  Goal: Patient will be free from infection during hospitalization  11/29/2023 2236 by Vanessa Innocent, RN  Outcome: Progressing  Flowsheets (Taken 11/29/2023  2236)  Free from Infection during hospitalization:   Assess and monitor for signs and symptoms of infection   Monitor lab/diagnostic results   Monitor all insertion sites (i.e. indwelling lines,  tubes, urinary catheters, and drains)   Encourage patient and family to use good hand hygiene technique  11/29/2023 2234 by Vanessa Innocent, RN  Outcome: Progressing  Flowsheets (Taken 11/29/2023 2234)  Free from Infection during hospitalization:   Assess and monitor for signs and symptoms of infection   Monitor lab/diagnostic results   Monitor all insertion sites (i.e. indwelling lines, tubes, urinary catheters, and drains)   Encourage patient and family to use good hand hygiene technique

## 2023-11-29 NOTE — PT Eval Note (Signed)
 Physical Therapy Evaluation  Vanessa Kelly      Post Acute Care Therapy Recommendations:     Discharge Recommendations:  Home with home health PT, Home with supervision (increased 1:1 supervision)    DME needs IF patient is discharging home: Patient already has needed equipment, No additional equipment/DME recommended at this time    Therapy discharge recommendations may change with patient status.  Please refer to most recent note for up-to-date recommendations.           [facility]  [address]  [place], [place] [address]  [phone]    Physical Therapy Evaluation    Patient: Vanessa Kelly MRN: [id number]   Unit: [facility] INTERMEDIATE CARE Bed: MI621/MI621-02    Time of Treatment:   PT Received On: 11/29/23  Start Time: 1132  Stop Time: 1215  Time Calculation (min): 43 min  Total Treatment Time (min): 40    Consult received for Vanessa Kelly for PT evaluation and treatment.  Patient's medical condition is appropriate for Physical Therapy  intervention at this time.      Assessment   Vanessa Kelly is a 83 y.o. female admitted 11/26/2023 d/t AMS; thought to be negative reaction from Sclopamine patch that was introduced at the SNF pt was completing rehabilitation services at one day prior to her discharge; discharged from the SNF ~1 week prior to admission. Pt lives at [facility]; has history of orthostatic hypotension.     Pt sleeping semi-supine in bed with HOB elevated upon arrival. Pt wakes with tactile cues at shoulder. Pt pleasant and agreeable to participate. Pt comes ot EOB with SBA, dons socks with spv, and able to initiate threading of brief. Pt stands with SBA, RW and completes donning of  brief with CGA. Pt dons second gown like a jacket with minA. Orthostatics (+) with significant change in SBP and DBP from sitting to standing. Pt takes seated rest break of ~2 minutes and reports dizziness has dissipated and would like to try to walk to room door (~20 ft). Pt  requires additional seated rest break after the 20 ft; BP recovers. Pt wanting ot amb additional reps; amb 15 feet before feeling dizzy. SBP and DBP with significant decreases again. Pt amenable to return to room via w/c. Pt and this Clinical research associate met by OT upon return to room; safe handoff complete and successful. Pt mobility limited by orthostatic hypotension this date; requires skilled PT interventions in order to increase pt independence with functional mobility, gait, activity tolerance, and to reduce pt risk of falls.       PMP - Progressive Mobility Protocol   PMP Activity: Step 6 - Walks in Room  Distance Walked (ft) (Step 6,7): 20 Feet       Rehabilitation Potential: Good       Interdisciplinary Communication: RN, OT, neurologist, attending      Plan     Plan  Risks/Benefits/POC Discussed with Pt/Family: With patient  Treatment/Interventions: Exercise;Gait training;Neuromuscular re-education;Functional transfer training;LE strengthening/ROM;Endurance training;Patient/family training;Equipment eval/education;Bed mobility  PT Frequency: 3-4x/wk         Medical Diagnosis: Dizziness [R42]  Facial droop [R29.810]  Dysarthria [R47.1]  Altered mental status, unspecified [R41.82]    History of Present Illness: Vanessa Kelly is a 83 y.o. female admitted on  11/26/2023 with a  diabetes, GERD, hyperlipidemia, hypertension, hypotension, insomnia, orthostatic hypotension, coronary artery disease CABG x 4, hyperlipidemia, PVD, muscle weakness, cerebrovascular disease, asthma, parkinsonism, CKD, fracture of medial  wall of orbit June 2025, frequent falls.  Recently hospitalized after fall orbital fracture and was discharged to rehab facility has been home 1 week.      -Admit to [employer] service     Arrives to ER with altered mental status last known normal 1030pm per ED note upon arrival was able to answer questions EMS reported right facial droop patient also with complaint of dizziness headache.  In the ED, CT head  no CVA, CTA noted chronic right ICA occlusion..  Troponin x 1 negative, blood glucose on arrival 111.  Urinalysis no leukocyte no WBC no nitrite.   Patient evaluated at bedside daughter [name] is also at bedside.  Daughter reports that she speaks to patient in the evening via FaceTime and yesterday evening patient was her usual.  Daughter did noticed that patient was using a walker and was advised to only use wheelchair and bedside commode if needed.  Today  speaking to patient at 1030 she reported that patient was making nonsensical statements.  Did not seem herself daughter was concerned that maybe this was a UTI as she has had prior and appeared this way.  Last known patient norm was yesterday at 10:30 PM per H&P note 11/26/23.       Problem List[1]  Medical History[2]  Past Surgical History[3]    Tests/Labs:  Lab Results   Component Value Date/Time    HGB 9.7 (L) 11/29/2023 04:58 AM    HGB 10.4 (L) 06/28/2022 06:18 AM    HCT 29.1 (L) 11/29/2023 04:58 AM    HCT 31.8 (L) 06/28/2022 06:18 AM    K 4.8 11/29/2023 04:58 AM    K 4.5 06/28/2022 06:18 AM    NA 138 11/29/2023 04:58 AM    NA 140 06/28/2022 06:18 AM    INR 1.0 11/26/2023 12:32 PM    TROPI 11.3 11/26/2023 12:32 PM    TROPI 6.8 08/18/2023 03:20 AM    TROPI 6.4 08/17/2023 09:41 PM    TROPI 13.9 07/15/2023 04:17 PM    TROPI 6.7 06/23/2022 06:41 PM    TROPI <2.7 01/18/2022 05:15 PM    TROPI <0.01 01/21/2021 02:30 PM         Imaging   MR Angiogram Head WO Contrast  Result Date: 11/26/2023  1.Motion degraded exam. 2.Occlusion of the right internal carotid artery, with reconstitution of the carotid terminus, from collaterals. 3.Motion artifact limits evaluation of stenosis in the proximal left internal carotid artery. The more distal cervical left internal carotid artery is patent. 4.Motion artifact limits evaluation of the intracranial arteries. The left anterior cerebral artery is not definitively seen, likely due to artifacts. 5.The vertebrobasilar arterial system  is patent. Vanessa ONEIDA Miyamoto, MD 11/26/2023 5:38 PM    MR Angiogram Neck WO Contrast  Result Date: 11/26/2023  1.Motion degraded exam. 2.Occlusion of the right internal carotid artery, with reconstitution of the carotid terminus, from collaterals. 3.Motion artifact limits evaluation of stenosis in the proximal left internal carotid artery. The more distal cervical left internal carotid artery is patent. 4.Motion artifact limits evaluation of the intracranial arteries. The left anterior cerebral artery is not definitively seen, likely due to artifacts. 5.The vertebrobasilar arterial system is patent. Vanessa ONEIDA Miyamoto, MD 11/26/2023 5:38 PM    MRI Brain WO Contrast  Result Date: 11/26/2023   1.No acute intracranial abnormality. 2.Mild volume loss and mild chronic microangiopathic ischemic changes. Chronic right parietal infarct, unchanged. 3.Complete opacification of right maxillary sinus. Vanessa ONEIDA Miyamoto, MD 11/26/2023 5:32 PM  Chest  AP Portable  Result Date: 11/26/2023   Cardiomegaly with mild pulmonary edema pattern. Vanessa Miyamoto, MD 11/26/2023 1:42 PM    CTA  Head & Neck  Result Date: 11/26/2023  1.No perfusion deficit suggestive of ischemic penumbra or infarct core. 2.Mildly delayed transit right cerebral hemisphere secondary to chronically occluded right ICA.  3.There is 25% stenosis of the proximal left internal carotid artery based on NASCET criteria. 4.The vertebrobasilar arterial system is patent. 5.Intracranial atherosclerosis without intracranial large vessel occlusion. Vanessa ONEIDA Sewer, MD 11/26/2023 12:43 PM    CT Perfusion Brain  Result Date: 11/26/2023  1.No perfusion deficit suggestive of ischemic penumbra or infarct core. 2.Mildly delayed transit right cerebral hemisphere secondary to chronically occluded right ICA.  3.There is 25% stenosis of the proximal left internal carotid artery based on NASCET criteria. 4.The vertebrobasilar arterial system is patent. 5.Intracranial atherosclerosis without  intracranial large vessel occlusion. Vanessa ONEIDA Sewer, MD 11/26/2023 12:43 PM    CT Head WO Contrast  Result Date: 11/26/2023   1.No intracranial hemorrhage or acute intracranial findings. 2.Parenchymal volume loss and chronic small vessel changes. Vanessa Minks, MD 11/26/2023 11:58 AM      Social History: (Per pt and chart)  Lives at [facility].  Entry Steps: 0             Inside steps: 0    Equipment at home:  rollator, w/c, grab bars in shower, built in shower bench, hand held shower head, grab bar around toilet area, and 3 prong cane (?hurrycane)  Prior Level of Function:  at least one fall per month estimating ~6+ falls in the last 6 months. No falls since previous [facility] admission (10/15/23-10/19/23; d/c'd from SNF ~1 week prior to current admission)              Cognition: WFL                         Mobility/Locomotion: modI with rollator. Has a w/c from previous hospitalization; doesn't use but reports self-propulsion is challenging for her              Feeding: independent - eats two meals/day in dining hall              Grooming: independent               Bathing: occasional assist with showering               Dressing: independent               Toileting: independent     Subjective   I'm feeling good. I'd like to walk. I get dizzy now and then but once it goes away, I'm fine again.   My hair is a sure fire mess.   Patient is agreeable to participation in the therapy session. Nursing clears patient for therapy.  Patient's Goal:  to walk and to gain more confidence steering the wheelchair  Pain: pt denies pain and no evidence of pain noted during session    Objective     Precautions/ Contraindications:   Precautions  Weight Bearing Status: no restrictions  Other Precautions: falls, monitor BP    Patient is in bed with telemetry, PrimaFit (external female catheter), and intravenous access in place.       Observation of patient/vitals:       11/29/23 1153 11/29/23 1158 11/29/23 1211    Vital Signs   Pulse (  SpO2) 63 82  --    BP 139/68 (!) 75/48 141/63   BP Location Right arm Right arm Right arm   BP Method Automatic Automatic Automatic   MAP (mmHg) 92 (!) 57 91   Patient Position Sitting  (unsupported at EOB; asymptomatic) Standing  (reports dizziness) Sitting  (supported in w/c s/p amb ~20 ft)   Oxygen Therapy   SpO2 100 % 100 %  --    O2 Device None (Room air) None (Room air)  --       11/29/23 1215   Vital Signs   Pulse (SpO2)  --    BP 97/51   BP Location Right arm   BP Method Automatic   MAP (mmHg) 69   Patient Position Sitting  (supported in w/c s/p amb ~15 ft; dizziness)   Oxygen Therapy   SpO2  --    O2 Device  --        Orientation/Cognition:  Alert and Oriented x 3  Cognition: Able to follow commands but easily overwhelmed      Musculoskeletal Examination:      ROM Strength   RLE [facility] WFL   LLE WFL WFL     Sensation: WFL  Coordination: WFL    Functional Mobility:  Rolling: SBA    Supine to sit: SBA  Scooting: SBA  Sit to stand: CGA with RW x 5 reps  Stand to sit: CGA   W/C Mobility: Able to self-propel w/c ~10 feet with BLE after therapist initiates momentum; difficulty steering (does not use UE)  Ambulation:     Weightbearing: no restrictions   Assistance level: CGA   Distance: 20 ft + 15 ft    Assistive Device: RW and w/c follow   Gait Deviations: decreased cadence      Balance:  Static Sit: Good  Dynamic Sit: Fair (+)  Static Stand: Fair  Dynamic Stand: Fair    Participation:  Good; limited by orthostatic hypotension    Education:  Educated the patient to role of physical therapy, plan of care, goals  of therapy, rationale for progressing mobility and safety with mobility and ADLs.    RN notified of session outcome and that patient was left in w/c near bathroom working with OT with all needs met and equipment intact.   Safety measures include: handoff to nurse/clin tech/ unit secretary completed. OT present.   Mobility and ADL status posted at bedside and within E.M.R.    AM-PACT  Inpatient Short Forms  Inpatient AM-PACT Performed? (PT): Basic Mobility Inpatient Short Form   AM-PACT 6 Clicks Basic Mobility Inpatient Short Form  Turning Over in Bed: A little  Sitting Down On/Standing From Armchair: A little  Lying on Back to Sitting on Side of Bed: A little  Assist Moving to/from Bed to Chair: A little  Assist to Walk in Hospital Room: A little  Assist to Climb 3-5 Steps with Railing: A little  PT Basic Mobility Raw Score: 18  CMS 0-100% Score: 46.58%      Goals  Goal Formulation: With patient  Time for Goal Acheivement: 7 visits  Goals: Select goal  Pt Will Roll Left: modified independent  Pt Will Roll Right: modified independent  Pt Will Go Supine To Sit: modified independent  Pt Will Perform Sit To Supine: modified independent  Pt Will Perform Sit to Stand: modified independent (with RW)  Pt Will Transfer Bed/Chair: modified independent;with rolling walker  Pt Will Ambulate: 51-100 feet;with rolling walker;with supervision  Pt  Will Propel Wheelchair: 51-150 feet;with supervision  Other Goal: Pt will lock/unlock w/c brakes indepedently during w/c mobility       Modified Rankin Scale:      I am certified in mRS: Yes  mRS Assessment Source: Patient  mRS Value: 1 - No significant disability    Therapist PPE during session procedural mask and gloves     Signature:  Anastasia Sofia, PT, DPT  11/29/2023  2:56 PM       (For scheduling questions, please contact rehab tech x [phone])             [1]   Patient Active Problem List  Diagnosis    Headache    Mastoiditis    Dizziness    Tremor    Adrenal cortical hypofunction    Anemia    Atherosclerotic heart disease of native coronary artery without angina pectoris    DM2 (diabetes mellitus, type 2) (CMS/HCC)    Essential hypertension    Fall    Gastroesophageal reflux disease without esophagitis    Carotid artery disease    Malignant tumor of breast (CMS/HCC)    Mixed hyperlipidemia    Parkinson's disease (CMS/HCC)    Peripheral vascular disorder  due to diabetes mellitus (CMS/HCC)    Stage 3 chronic kidney disease (CMS/HCC)    Unsteadiness on feet    Injury of head, initial encounter    Altered mental status, unspecified   [2]   Past Medical History:  Diagnosis Date    Diabetes mellitus (CMS/HCC)     Gastroesophageal reflux disease     Hyperlipidemia     Hypertension     Hypotension     Malignant tumor of breast (CMS/HCC) 04/13/2016    Seasonal allergic rhinitis     Syncope    [3]   Past Surgical History:  Procedure Laterality Date    BREAST BIOPSY Right     2014    CORONARY ARTERY BYPASS      HYSTERECTOMY      [phone]

## 2023-11-29 NOTE — Plan of Care (Signed)
 Problem: Moderate/High Fall Risk Score >5  Goal: Patient will remain free of falls  11/29/2023 1116 by Vanessa Crank, RN  Outcome: Progressing  Flowsheets  Taken 11/28/2023 1102 by Vanessa Crank, RN  High (Greater than 13):   HIGH-Bed alarm on at all times while patient in bed   HIGH-Apply yellow Fall Risk arm band   MOD-Perform dangle, stand, walk (DSW) prior to mobilization  Taken 11/27/2023 0628 by Vanessa Rancher, RN  VH High Risk (Greater than 13):   ALL REQUIRED LOW INTERVENTIONS   PATIENT IS TO BE SUPERVISED FOR ALL TOILETING ACTIVITIES   BED ALARM WILL BE ACTIVATED WHEN THE PATEINT IS IN BED WITH SIGNAGE RESET BED ALARM  11/29/2023 1110 by Vanessa Crank, RN  Outcome: Progressing  Flowsheets (Taken 11/28/2023 1102 by Vanessa Crank, RN)  High (Greater than 13):   HIGH-Bed alarm on at all times while patient in bed   HIGH-Apply yellow Fall Risk arm band   MOD-Perform dangle, stand, walk (DSW) prior to mobilization     Problem: Day of Admission - Stroke  Goal: Core/Quality measure requirements - Admission  11/29/2023 1116 by Vanessa Crank, RN  Outcome: Progressing  Flowsheets (Taken 11/27/2023 0628 by Vanessa Rancher, RN)  Core/Quality measure requirements - Admission:   Document NIH Stroke Scale on admission   VTE Prevention: Ensure anticoagulant(s) administered and/or anti-embolism stockings/devices documented as ordered   Document nursing swallow/dysphagia screen on admission. If patient fails, keep patient NPO (follow your hospital protocol on swallowing screening).   Ensure lipid panel ordered   Begin stroke education on admission (must include Modifiable Risk Factors, Warning Signs and Symptoms of Stroke, Activation of Emergency Medical System and Follow-up Appointments) Ensure handout has been given and documented.  11/29/2023 1110 by Vanessa Crank, RN  Outcome: Progressing  Flowsheets (Taken 11/27/2023 0628 by Vanessa Rancher, RN)  Core/Quality measure requirements -  Admission:   Document NIH Stroke Scale on admission   VTE Prevention: Ensure anticoagulant(s) administered and/or anti-embolism stockings/devices documented as ordered   Document nursing swallow/dysphagia screen on admission. If patient fails, keep patient NPO (follow your hospital protocol on swallowing screening).   Ensure lipid panel ordered   Begin stroke education on admission (must include Modifiable Risk Factors, Warning Signs and Symptoms of Stroke, Activation of Emergency Medical System and Follow-up Appointments) Ensure handout has been given and documented.     Problem: Every Day - Stroke  Goal: Core/Quality measure requirements - Daily  11/29/2023 1116 by Vanessa Crank, RN  Outcome: Progressing  Flowsheets (Taken 11/27/2023 1245 by Vanessa Crank, RN)  Core/Quality measure requirements - Daily:   VTE Prevention: Ensure anticoagulant(s) administered and/or anti-embolism stockings/devices documented by end of day 2   Ensure antithrombotic administered or contraindication documented by LIP by end of day 2   Once lipid panel has resulted, check LDL. Contact provider for statin order if LDL > 70 (or ensure contraindication documented by LIP).   Continue stroke education (must include Modifiable Risk Factors, Warning Signs and Symptoms of Stroke, Activation of Emergency Medical System and Follow-up Appointments). Ensure handout has been given and documented.  11/29/2023 1110 by Vanessa Crank, RN  Outcome: Progressing  Flowsheets (Taken 11/27/2023 1245 by Vanessa Crank, RN)  Core/Quality measure requirements - Daily:   VTE Prevention: Ensure anticoagulant(s) administered and/or anti-embolism stockings/devices documented by end of day 2   Ensure antithrombotic administered or contraindication documented by LIP by end of day 2   Once lipid panel has resulted, check LDL. Contact provider for statin order  if LDL > 70 (or ensure contraindication documented by LIP).   Continue stroke education  (must include Modifiable Risk Factors, Warning Signs and Symptoms of Stroke, Activation of Emergency Medical System and Follow-up Appointments). Ensure handout has been given and documented.  Goal: Neurological status is stable or improving  11/29/2023 1116 by Vanessa Crank, RN  Outcome: Progressing  Flowsheets (Taken 11/27/2023 1245 by Vanessa Crank, RN)  Neurological status is stable or improving:   Monitor/assess/document neurological assessment (Stroke: every 4 hours)   Monitor/assess NIH Stroke Scale   Re-assess NIH Stroke Scale for any change in status   Observe for seizure activity and initiate seizure precautions if indicated   Perform CAM Assessment  11/29/2023 1110 by Vanessa Crank, RN  Outcome: Progressing  Flowsheets (Taken 11/27/2023 1245 by Vanessa Crank, RN)  Neurological status is stable or improving:   Monitor/assess/document neurological assessment (Stroke: every 4 hours)   Monitor/assess NIH Stroke Scale   Re-assess NIH Stroke Scale for any change in status   Observe for seizure activity and initiate seizure precautions if indicated   Perform CAM Assessment  Goal: Stable vital signs and fluid balance  11/29/2023 1116 by Vanessa Crank, RN  Outcome: Progressing  Flowsheets (Taken 11/27/2023 1245 by Vanessa Crank, RN)  Stable vital signs and fluid balance:   Monitor and assess vitals every 4 hours or as ordered and hemodynamic parameters   Apply telemetry monitor as ordered  11/29/2023 1110 by Vanessa Crank, RN  Outcome: Progressing  Flowsheets (Taken 11/27/2023 1245 by Vanessa Crank, RN)  Stable vital signs and fluid balance:   Monitor and assess vitals every 4 hours or as ordered and hemodynamic parameters   Apply telemetry monitor as ordered  Goal: Patient will maintain adequate oxygenation  11/29/2023 1116 by Vanessa Crank, RN  Outcome: Progressing  11/29/2023 1110 by Vanessa Crank, RN  Outcome: Progressing  Goal: Patient's risk of  aspiration will be minimized  11/29/2023 1116 by Vanessa Crank, RN  Outcome: Progressing  Flowsheets (Taken 11/27/2023 1245 by Vanessa Crank, RN)  Patient's risk of aspiration will be minimized:   Monitor/assess for signs of aspiration (tachypnea, cough, wheezing, clearing throat, hoarseness after eating, decrease in SaO2   Assess and monitor ability to swallow   Keep head of bed up a minimum of 30 degrees when hemodynamically stable  11/29/2023 1110 by Vanessa Crank, RN  Outcome: Progressing  Flowsheets (Taken 11/27/2023 1245 by Vanessa Crank, RN)  Patient's risk of aspiration will be minimized:   Monitor/assess for signs of aspiration (tachypnea, cough, wheezing, clearing throat, hoarseness after eating, decrease in SaO2   Assess and monitor ability to swallow   Keep head of bed up a minimum of 30 degrees when hemodynamically stable  Goal: Nutritional intake is adequate  11/29/2023 1116 by Vanessa Crank, RN  Outcome: Progressing  Flowsheets (Taken 11/29/2023 1110)  Nutritional intake is adequate: Consult/collaborate with Clinical Nutritionist  11/29/2023 1110 by Vanessa Crank, RN  Outcome: Progressing  Flowsheets (Taken 11/29/2023 1110)  Nutritional intake is adequate: Consult/collaborate with Clinical Nutritionist  Goal: Elimination patterns are normal or improving  11/29/2023 1116 by Vanessa Crank, RN  Outcome: Progressing  11/29/2023 1110 by Vanessa Crank, RN  Outcome: Progressing  Goal: Mobility/Activity is maintained at optimal level for patient  11/29/2023 1116 by Vanessa Crank, RN  Outcome: Progressing  11/29/2023 1110 by Vanessa Crank, RN  Outcome: Progressing  Goal: Skin integrity is maintained or improved  11/29/2023 1116 by Vanessa Crank, RN  Outcome: Progressing  Flowsheets (Taken 11/29/2023 1110)  Skin integrity is maintained or improved:   Assess Braden Scale every shift   Relieve pressure to bony prominences   Increase activity  as tolerated/progressive mobility   Keep skin clean and dry  11/29/2023 1110 by Vanessa Crank, RN  Outcome: Progressing  Flowsheets (Taken 11/29/2023 1110)  Skin integrity is maintained or improved:   Assess Braden Scale every shift   Relieve pressure to bony prominences   Increase activity as tolerated/progressive mobility   Keep skin clean and dry  Goal: Neurovascular status is stable or improving  11/29/2023 1116 by Vanessa Crank, RN  Outcome: Progressing  Flowsheets (Taken 11/29/2023 1110)  Neurovascular status is stable or improving: Monitor/assess neurovascular status (pulses, capillary refill, pain, paresthesia, presence of edema)  11/29/2023 1110 by Vanessa Crank, RN  Outcome: Progressing  Flowsheets (Taken 11/29/2023 1110)  Neurovascular status is stable or improving: Monitor/assess neurovascular status (pulses, capillary refill, pain, paresthesia, presence of edema)  Goal: Effective coping demonstrated  11/29/2023 1116 by Vanessa Crank, RN  Outcome: Progressing  11/29/2023 1110 by Vanessa Crank, RN  Outcome: Progressing  Goal: Will be able to express needs and understand communication  11/29/2023 1116 by Vanessa Crank, RN  Outcome: Progressing  11/29/2023 1110 by Vanessa Crank, RN  Outcome: Progressing     Problem: Compromised Sensory Perception  Goal: Sensory Perception Interventions  11/29/2023 1116 by Vanessa Crank, RN  Outcome: Progressing  11/29/2023 1110 by Vanessa Crank, RN  Outcome: Progressing  Flowsheets (Taken 11/29/2023 [phone])  Sensory Perception Interventions: Offload heels, Pad bony prominences, Reposition q 2hrs/turn Clock, Q2 hour skin assessment under devices if present     Problem: Compromised Moisture  Goal: Moisture level Interventions  11/29/2023 1116 by Vanessa Crank, RN  Outcome: Progressing  11/29/2023 1110 by Vanessa Crank, RN  Outcome: Progressing     Problem: Compromised Activity/Mobility  Goal:  Activity/Mobility Interventions  11/29/2023 1116 by Vanessa Crank, RN  Outcome: Progressing  11/29/2023 1110 by Vanessa Crank, RN  Outcome: Progressing     Problem: Compromised Nutrition  Goal: Nutrition Interventions  11/29/2023 1116 by Vanessa Crank, RN  Outcome: Progressing  11/29/2023 1110 by Vanessa Crank, RN  Outcome: Progressing     Problem: Compromised Friction/Shear  Goal: Friction and Shear Interventions  11/29/2023 1116 by Vanessa Crank, RN  Outcome: Progressing  11/29/2023 1110 by Vanessa Crank, RN  Outcome: Progressing     Problem: Day of Discharge - Stroke  Goal: Able to express understanding of discharge instructions and stroke education  11/29/2023 1116 by Vanessa Crank, RN  Outcome: Progressing  11/29/2023 1110 by Vanessa Crank, RN  Outcome: Progressing  Goal: Core/Quality measures - Discharge  11/29/2023 1116 by Vanessa Crank, RN  Outcome: Progressing  11/29/2023 1110 by Vanessa Crank, RN  Outcome: Progressing     Problem: Safety  Goal: Patient will be free from injury during hospitalization  11/29/2023 1116 by Vanessa Crank, RN  Outcome: Progressing  11/29/2023 1110 by Vanessa Crank, RN  Outcome: Progressing  Flowsheets (Taken 11/28/2023 1103 by Vanessa Crank, RN)  Patient will be free from injury during hospitalization:   Assess patient's risk for falls and implement fall prevention plan of care per policy   Use appropriate transfer methods   Ensure appropriate safety devices are available at the bedside   Provide and maintain safe environment   Hourly rounding   Include patient/ family/ care giver in decisions related to safety   Assess for patients risk for elopement and implement Elopement Risk Plan per policy   Provide alternative method  of communication if needed [employer], writing)  Goal: Patient will be free from infection during hospitalization  11/29/2023 1116 by Vanessa Crank, RN  Outcome: Progressing  11/29/2023 1110 by Vanessa Crank, RN  Outcome: Progressing  Flowsheets (Taken 11/28/2023 1103 by Vanessa Crank, RN)  Free from Infection during hospitalization:   Assess and monitor for signs and symptoms of infection   Monitor all insertion sites (i.e. indwelling lines, tubes, urinary catheters, and drains)   Monitor lab/diagnostic results   Encourage patient and family to use good hand hygiene technique

## 2023-11-29 NOTE — Progress Notes (Signed)
 Orthostatic Vitals Assessment During PT/OT Evaluations     11/29/23 1153 11/29/23 1158 11/29/23 1211   Vital Signs   Pulse (SpO2) 63 82  --    BP 139/68 (!) 75/48 141/63   BP Location Right arm Right arm Right arm   BP Method Automatic Automatic Automatic   MAP (mmHg) 92 (!) 57 91   Patient Position Sitting  (unsupported at EOB; asymptomatic) Standing  (reports dizziness) Sitting  (supported in w/c s/p amb ~20 ft)   Oxygen Therapy   SpO2 100 % 100 %  --    O2 Device None (Room air) None (Room air)  --       11/29/23 1215 11/29/23 1226   Vital Signs   Pulse (SpO2)  --   --    BP 97/51 101/59   BP Location Right arm Right arm   BP Method Automatic Automatic   MAP (mmHg) 69 73   Patient Position Sitting  (supported in w/c s/p amb ~15 ft; dizziness) Sitting  (in w/c. Asymptomatic)   Oxygen Therapy   SpO2  --  100 %   O2 Device  --   --      Attending and RN made aware. Full PT/OT notes to follow.      Anastasia Sofia, PT, DPT   11/29/2023  12:52 PM  [phone] for Rehab Tech/scheduling questions)

## 2023-11-29 NOTE — Progress Notes (Signed)
 11/29/23 0900   Volunteer Chaplain   Visit Type Initial   Source Chaplain Initiated   Present at Visit Patient   Spiritual Care Provided to patient   Reason for Visit Spiritual Support   Spiritual Care Interventions Provided prayer;Provided spiritual resources   Spiritual Care Outcomes Patient expressed appreciation of visit   Length of Visit 16-30 minutes   Follow-up No follow-up at this time

## 2023-11-29 NOTE — Progress Notes (Signed)
 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   July 30 continues to slowly get better  Discussed with daughter who is stating that her mental status has improved     Review of Systems:    Pt too cognitively or communication impaired to participate in ROS.    Physical Exam:   BP 133/65   Pulse 66   Temp 98.2 F (36.8 C) (Oral)   Resp 18   Ht 1.702 m (5' 7.01)   Wt 79.8 kg (176 lb)   SpO2 100%   BMI 27.56 kg/m     Neuro:  Level of consciousness  Sitting edge of bed awake alert oriented x 3 speech fluent smile symmetric, much improved as compared to prior exam no nystagmus, no asterixis  Meds:      Scheduled Meds: PRN Meds:    enoxaparin , 40 mg, Subcutaneous, Daily  insulin  lispro, 1-3 Units, Subcutaneous, QHS  insulin  lispro, 1-5 Units, Subcutaneous, TID AC  lidocaine , 2 patch, Transdermal, Q24H  pantoprazole , 40 mg, Oral, QAM AC  ranolazine , 500 mg, Oral, BID        Continuous Infusions:   acetaminophen , 650 mg, Q6H PRN  acetaminophen , 325 mg, Q4H PRN  carboxymethylcellulose sodium, 1 drop, TID PRN  dextrose , 15 g of glucose, PRN   Or  dextrose , 12.5 g, PRN   Or  dextrose , 12.5 g, PRN   Or  glucagon  (rDNA), 1 mg, PRN  hydrALAZINE , 25 mg, Q8H PRN  magnesium  sulfate, 1 g,  PRN  melatonin, 3 mg, QHS PRN  midodrine , 2.5 mg, Q8H PRN  naloxone , 0.2 mg, PRN  potassium & sodium phosphates , 2 packet, PRN  potassium chloride , 0-60 mEq, PRN   Or  potassium chloride , 0-60 mEq, PRN   Or  potassium chloride , 10 mEq, PRN  saline, 2 spray, Q4H PRN                Labs:     Recent Labs   Lab 11/29/23  0458 11/27/23  0621 11/26/23  1232   Glucose 93 97 84   BUN 19 20 23*   Creatinine 1.4* 1.4* 1.5*   Calcium  9.8 9.6 9.5   Sodium 138 138 130*   Potassium 4.8 4.6 5.4*   Chloride 107 106 101   CO2 24 22 24    Albumin  --   --  3.2*   AST (SGOT)  --   --  25   ALT  --   --  32   Bilirubin, Total  --   --  0.3   Alkaline Phosphatase  --   --  83     Recent Labs   Lab 11/29/23  0458 11/27/23  0621 11/26/23  1232   WBC 5.58 5.15 7.31   Hemoglobin 9.7* 9.0* 9.0*   Hematocrit 29.1* 26.6* 27.0*   MCV 99.3* 97.8* 98.9*   MCH 33.1 33.1 33.0   MCHC 33.3 33.8 33.3   Platelet Count 192 188 202                 Lab Results   Component Value Date    TSH 1.31 11/27/2023               .ll    No results for input(s): PTT, PT, INR in the last 72 hours.       No results found.     All brain imaging (MRI, CT) personally reviewed.    Case discussed with: Patient's daughter at the bedside      TTS--35 minutes;  involving time spent examining patient, in counseling or coordination of care, reviewing test results, and in documentation.        I personally reviewed all of the medications.  Medication list generated using all available resources.  Elder abuse (physical)  - negative  Advanced care plan - reviewed from chart or in discussion with pt or family        This note was generated by the Epic EMR system/ Dragon speech recognition and may contain inherent errors or omissions not intended by the user. Grammatical errors, random word insertions, deletions and pronoun errors  are occasional consequences of this technology due to software limitations.   Not all errors are caught or corrected. If there are questions or  concerns about the content of this note or information contained within the body of this dictation they should be addressed directly with the author for clarification.      Signed by: Reese GORMAN Erps, MD, MD  Spectralink: [id number]      Answering Service: [phone]

## 2023-11-29 NOTE — OT Eval Note (Addendum)
 Occupational Therapy Evaluation  [name]        Post Acute Care Therapy Recommendations:     Discharge Recommendations:  Home with supervision, Home with home health OT (return to ALF, with increased supervision)    DME needs IF patient is discharging home: No additional equipment/DME recommended at this time, Patient already has needed equipment    Therapy discharge recommendations may change with patient status.  Please refer to most recent note for up-to-date recommendations.         [facility]  [address]  [place], [place] [address]  [phone]    Occupational Therapy Evaluation    Patient: SHEVAWN LANGENBERG MRN: [id number]   Unit: [facility] INTERMEDIATE CARE Bed: MI621/MI621-02    Time of treatment:   OT Received On: 11/29/23  Start Time: 1216  Stop Time: 1229  Time Calculation (min): 13 min       Consult received for [name] for OT evaluation and treatment.  Patient's medical condition is appropriate for Occupational Therapy  intervention at this time.    Interpreter utilized: no, not indicated    Assessment     TIFANIE GARDINER is a 83 y.o. female admitted 11/26/2023 due to altered mental status and right facial droop; R/O stroke. Imaging (-) for acute abnormality. Pt has a PMH significant for diabetes, GERD, hyperlipidemia, hypertension, hypotension, insomnia, orthostatic hypotension, coronary artery disease CABG x 4, hyperlipidemia, PVD, muscle weakness, cerebrovascular disease, asthma, parkinsonism, CKD, fracture of medial wall of orbit June 2025, frequent falls.     Direct handoff from PT in w/c. Pt requires max assist for w/c mobility to restroom. Pt transfers w/c to toilet CGA using grab bars. She requires min assist for clothing mgmt. Pt unsuccessful attempting to void and thus transfers back to w/c. Of note, BP drops significantly when patient was working with PT, but at end of OT evaluation BP 101/59 and asymptomatic. Observed patient complete UBD with set-up and  don socks with set-up. Given current functional status, pt would benefit from continued skilled OT services to address ADL performance, safety with functional mobility, energy conservation, and progression toward independence. At this time, recommend D/C to home (ALF) with increased supervision, HHOT.     Pt's ability to complete ADLs and functional transfers is impaired due to the following deficits: decreased activity tolerance, decreased balance, decreased coordination, decreased safety awareness, decreased strength, and transfers .  Pt demonstrates performance deficits with grooming, dressing, toileting, and functional mobility. There are a few comorbidities or other factors that affect plan of care and require modification of task including: assistive device needed for mobility, frequent falls, vertigo/dizziness, lives alone, and questionable cognitive impairment?. Standardized tests and exams incorporated into evaluation include AM-PAC Daily Activity, balance, coordination, vision, mobility, ROM/strength, and cognition/orientation. Pt would continue to benefit from OT to address these deficits and increase functional independence.        Complexity Chart Review Performance Deficits Clinical Decision Making Hx/Comorbidities Assistance needed   Moderate Expanded 3-5 Several Options 1-2 Min/Mod assist (not at baseline)     PMP - Progressive Mobility Protocol   PMP Activity: Step 5 - Chair  Distance Walked (ft) (Step 6,7): 0 Feet     Rehabilitation Potential: good for goals     Interdisciplinary Communication: discussed with RN, PT and MD    Plan     OT Plan  Risks/Benefits/POC Discussed with Pt/Family: With patient  Treatment Interventions: ADL retraining;Functional transfer training;UE strengthening/ROM;Endurance training;Patient/Family training;Equipment eval/education;Fine  motor coordination activities;Compensatory technique education  Discharge Recommendation: Home with supervision;Home with home health OT  (return to ALF, with increased supervision)  DME Recommended for Discharge: No additional equipment/DME recommended at this time;Patient already has needed equipment  OT Frequency Recommended: 2-3x/wk         Medical Diagnosis: Dizziness [R42]  Facial droop [R29.810]  Dysarthria [R47.1]  Altered mental status, unspecified [R41.82]    History of Present Illness: JAZARI OBER is a 83 y.o. female admitted on  11/26/2023 with diabetes, GERD, hyperlipidemia, hypertension, hypotension, insomnia, orthostatic hypotension, coronary artery disease CABG x 4, hyperlipidemia, PVD, muscle weakness, cerebrovascular disease, asthma, parkinsonism, CKD, fracture of medial wall of orbit June 2025, frequent falls.  Recently hospitalized after fall orbital fracture and was discharged to rehab facility has been home 1 week.      -Admit to [employer] service     Arrives to ER with altered mental status last known normal 1030pm per ED note upon arrival was able to answer questions EMS reported right facial droop patient also with complaint of dizziness headache.  In the ED, CT head no CVA, CTA noted chronic right ICA occlusion..  Troponin x 1 negative, blood glucose on arrival 111.  Urinalysis no leukocyte no WBC no nitrite.   Patient evaluated at bedside daughter [name] is also at bedside.  Daughter reports that she speaks to patient in the evening via FaceTime and yesterday evening patient was her usual.  Daughter did noticed that patient was using a walker and was advised to only use wheelchair and bedside commode if needed.  Today  speaking to patient at 1030 she reported that patient was making nonsensical statements.  Did not seem herself daughter was concerned that maybe this was a UTI as she has had prior and appeared this way.  Last known patient norm was yesterday at 10:30 PM. - per H&P      Problem List[1]  Medical History[2]  Past Surgical History[3]    Tests/Labs:  Lab Results   Component Value Date/Time    HGB 9.7  (L) 11/29/2023 04:58 AM    HGB 10.4 (L) 06/28/2022 06:18 AM    HCT 29.1 (L) 11/29/2023 04:58 AM    HCT 31.8 (L) 06/28/2022 06:18 AM    K 4.8 11/29/2023 04:58 AM    K 4.5 06/28/2022 06:18 AM    NA 138 11/29/2023 04:58 AM    NA 140 06/28/2022 06:18 AM    INR 1.0 11/26/2023 12:32 PM    TROPI 11.3 11/26/2023 12:32 PM    TROPI 6.8 08/18/2023 03:20 AM    TROPI 6.4 08/17/2023 09:41 PM    TROPI 13.9 07/15/2023 04:17 PM    TROPI 6.7 06/23/2022 06:41 PM    TROPI <2.7 01/18/2022 05:15 PM    TROPI <0.01 01/21/2021 02:30 PM         Imaging:  MR Angiogram Head WO Contrast  Result Date: 11/26/2023  1.Motion degraded exam. 2.Occlusion of the right internal carotid artery, with reconstitution of the carotid terminus, from collaterals. 3.Motion artifact limits evaluation of stenosis in the proximal left internal carotid artery. The more distal cervical left internal carotid artery is patent. 4.Motion artifact limits evaluation of the intracranial arteries. The left anterior cerebral artery is not definitively seen, likely due to artifacts. 5.The vertebrobasilar arterial system is patent. Oakley ONEIDA Miyamoto, MD 11/26/2023 5:38 PM    MR Angiogram Neck WO Contrast  Result Date: 11/26/2023  1.Motion degraded exam. 2.Occlusion of the right internal carotid artery,  with reconstitution of the carotid terminus, from collaterals. 3.Motion artifact limits evaluation of stenosis in the proximal left internal carotid artery. The more distal cervical left internal carotid artery is patent. 4.Motion artifact limits evaluation of the intracranial arteries. The left anterior cerebral artery is not definitively seen, likely due to artifacts. 5.The vertebrobasilar arterial system is patent. Oakley ONEIDA Miyamoto, MD 11/26/2023 5:38 PM    MRI Brain WO Contrast  Result Date: 11/26/2023   1.No acute intracranial abnormality. 2.Mild volume loss and mild chronic microangiopathic ischemic changes. Chronic right parietal infarct, unchanged. 3.Complete opacification  of right maxillary sinus. Oakley ONEIDA Miyamoto, MD 11/26/2023 5:32 PM    Chest  AP Portable  Result Date: 11/26/2023   Cardiomegaly with mild pulmonary edema pattern. [name], MD 11/26/2023 1:42 PM    CTA  Head & Neck  Result Date: 11/26/2023  1.No perfusion deficit suggestive of ischemic penumbra or infarct core. 2.Mildly delayed transit right cerebral hemisphere secondary to chronically occluded right ICA.  3.There is 25% stenosis of the proximal left internal carotid artery based on NASCET criteria. 4.The vertebrobasilar arterial system is patent. 5.Intracranial atherosclerosis without intracranial large vessel occlusion. [name], MD 11/26/2023 12:43 PM    CT Perfusion Brain  Result Date: 11/26/2023  1.No perfusion deficit suggestive of ischemic penumbra or infarct core. 2.Mildly delayed transit right cerebral hemisphere secondary to chronically occluded right ICA.  3.There is 25% stenosis of the proximal left internal carotid artery based on NASCET criteria. 4.The vertebrobasilar arterial system is patent. 5.Intracranial atherosclerosis without intracranial large vessel occlusion. [name], MD 11/26/2023 12:43 PM    CT Head WO Contrast  Result Date: 11/26/2023   1.No intracranial hemorrhage or acute intracranial findings. 2.Parenchymal volume loss and chronic small vessel changes. [name], MD 11/26/2023 11:58 AM       Social History: (Per family/care provider, if patient is not able to give accurate report)  Lives at an ALF [facility]).  Entry Steps: none       Inside steps: elevator access    Equipment at home:  single point cane, rollator, w/c, walk in shower, grab bars in shower, built in shower bench, hand held shower head, and grab bar around toilet area  Prior Level of Function:                         Cognition: WFL                         Mobility: mod indep with rollator               Feeding: indep              Grooming: indep              Bathing: occasional assist from  staff              Dressing: indep              Toileting: mod indep grab bars around toilet    Subjective   Patient is agreeable to participation in the therapy session. Nursing clears patient for therapy.  Patient's Goal:  none stated   Pain: pt denies pain and no evidence of pain noted during session.    Objective     Precautions:   Precautions  Weight Bearing Status: no restrictions  Other Precautions: falls, monitor BP    Patient is in  w/c working with PT with telemetry and intravenous access in place.       Observation of patient/vitals:   Vitals:    11/29/23 1142 11/29/23 1153 11/29/23 1158 11/29/23 1226   BP: 123/68 139/68 (!) 75/48 101/59   Pulse: 68 68 79 77   Resp: 18      Temp: 97.5 F (36.4 C)      TempSrc: Oral      SpO2: 99% 100% 100% 100%   Weight:       Height:           Orientation/Cognition:     Alert and Oriented x 3  Cognition: follows commands, gets overwhelmed easily in busy environment      Musculoskeletal Examination:     ROM Strength   Neck/ Trunk WFL WFL   RUE WFL WFL   LUE WFL WFL         Sensation: Intact to light touch, denies numbness/tingling throughout BUE   Coordination: Intact gross motor and serial opposition to B hands    Vision: WFL  Hearing: WFL      Functional Mobility:  Sit to stand: CGA using RW  Stand to sit: CGA using RW  Transfers: on/off w/c, toilet     Balance:  Static Sit Balance: good  Dynamic Sit Balance: good  Static Stand Balance: fair  Dynamic Stand Balance: fair    Self Care:  UB Dressing: set-up  LB Dressing: set-up  Toileting: min assist     Endurance: good    Participation:  good    Education:  Educated the Personal assistant to role of occupational therapy, plan of care, goals  of therapy, rationale for progressing mobility and safety with mobility and ADLs and home safety.    RN notified of session outcome and that patient was left in w/c with all needs met and equipment intact.   Safety measures include: handoff to nurse/clin tech/ unit secretary  completed, chair alarm activated, oriented to call bell and placed within reach, personal items within reach, and assistive device positioned out of reach.   Mobility and ADL status posted at bedside and within E.M.R.        AM-PACT 6 Clicks Daily Activity Inpatient Short Form  Inpatient AM-PACT Performed?: yes  Put On/Take Off Lower Body Clothing: 3  Assist with Bathing: 3  Assist with Toileting: 3  Put On/Take Off Upper Body Clothing: 3  Assist with Grooming: 3  Assist with Eating: 4  OT Daily Activity Raw Score: 19  CMS 0-100% Score: 42.80%       Goals:  Goals  Goal Formulation: Patient  Time For Goal Achievement: by time of discharge  Goals: Select goal  Patient will groom self: Modified Independent  Patient will dress upper body: Modified Independent  Patient will dress lower body: Modified Independent  Patient will toilet: Modified Independent  Pt will perform functional transfers: Modified Independent (using LRAD)        Therapist PPE during session procedural mask and gloves      Signature:   Elenor Jensen, OT  11/29/2023  12:46 PM    (For scheduling questions, please contact rehab tech x [phone])          [1]   Patient Active Problem List  Diagnosis    Headache    Mastoiditis    Dizziness    Tremor    Adrenal cortical hypofunction    Anemia    Atherosclerotic heart disease of native coronary artery without angina  pectoris    DM2 (diabetes mellitus, type 2) (CMS/HCC)    Essential hypertension    Fall    Gastroesophageal reflux disease without esophagitis    Carotid artery disease    Malignant tumor of breast (CMS/HCC)    Mixed hyperlipidemia    Parkinson's disease (CMS/HCC)    Peripheral vascular disorder due to diabetes mellitus (CMS/HCC)    Stage 3 chronic kidney disease (CMS/HCC)    Unsteadiness on feet    Injury of head, initial encounter    Altered mental status, unspecified   [2]   Past Medical History:  Diagnosis Date    Diabetes mellitus (CMS/HCC)     Gastroesophageal reflux disease      Hyperlipidemia     Hypertension     Hypotension     Malignant tumor of breast (CMS/HCC) 04/13/2016    Seasonal allergic rhinitis     Syncope    [3]   Past Surgical History:  Procedure Laterality Date    BREAST BIOPSY Right     2014    CORONARY ARTERY BYPASS      HYSTERECTOMY      [phone]

## 2023-11-30 LAB — CBC
Absolute nRBC: 0 x10 3/uL (ref <=0.00)
Hematocrit: 29.3 % — ABNORMAL LOW (ref 34.7–43.7)
Hemoglobin: 10 g/dL — ABNORMAL LOW (ref 11.4–14.8)
MCH: 33.4 pg (ref 25.1–33.5)
MCHC: 34.1 g/dL (ref 31.5–35.8)
MCV: 98 fL — ABNORMAL HIGH (ref 78.0–96.0)
MPV: 10.2 fL (ref 8.9–12.5)
Platelet Count: 184 x10 3/uL (ref 142–346)
RBC: 2.99 x10 6/uL — ABNORMAL LOW (ref 3.90–5.10)
RDW: 13 % (ref 11–15)
WBC: 5.26 x10 3/uL (ref 3.10–9.50)
nRBC %: 0 /100{WBCs} (ref <=0.0)

## 2023-11-30 LAB — WHOLE BLOOD GLUCOSE POCT
Whole Blood Glucose POCT: 100 mg/dL (ref 70–100)
Whole Blood Glucose POCT: 134 mg/dL — ABNORMAL HIGH (ref 70–100)
Whole Blood Glucose POCT: 147 mg/dL — ABNORMAL HIGH (ref 70–100)
Whole Blood Glucose POCT: 155 mg/dL — ABNORMAL HIGH (ref 70–100)
Whole Blood Glucose POCT: 185 mg/dL — ABNORMAL HIGH (ref 70–100)

## 2023-11-30 LAB — BASIC METABOLIC PANEL
Anion Gap: 7 (ref 5.0–15.0)
BUN: 21 mg/dL (ref 7–21)
CO2: 25 meq/L (ref 17–29)
Calcium: 9.7 mg/dL (ref 7.9–10.2)
Chloride: 108 meq/L (ref 99–111)
Creatinine: 1.4 mg/dL — ABNORMAL HIGH (ref 0.4–1.0)
GFR: 36.4 mL/min/1.73 m2 — ABNORMAL LOW (ref >=60.0)
Glucose: 125 mg/dL — ABNORMAL HIGH (ref 70–100)
Potassium: 4.5 meq/L (ref 3.5–5.3)
Sodium: 140 meq/L (ref 135–145)

## 2023-11-30 NOTE — Progress Notes (Signed)
 11/30/23 1350 11/30/23 1353 11/30/23 1356   Vital Signs   Heart Rate 80 80 76   Heart Rate Source Monitor Monitor Monitor   Resp Rate 20 (!) 30 20   BP 156/79 143/72 122/72   BP Location Right arm Right arm Right arm   BP Method  --   --  Automatic   MAP (mmHg) 105 95 89   Patient Position Lying Sitting Standing

## 2023-11-30 NOTE — PT Progress Note (Signed)
 [facility]  Physical Therapy Attempt Note    Patient:  Vanessa Kelly MRN#:  [id number]    Unit:  [facility] INTERMEDIATE CARE Room/Bed:  MI621/MI621-02    PT Cancellation: Order  PT Order Cancellation Reason: Provider/Medical Hold (comment required) - Provider/RN decision (orders d/c'd by attending. PT will need new orders to resume care if/when medically appropriate.)        Signature:   Anastasia Sofia, PT, DPT  11/30/2023  10:52 AM       (For scheduling questions, please contact rehab tech x [phone])

## 2023-11-30 NOTE — Progress Notes (Signed)
 [facility] Consult received for Home Health Care Services, Patient will be given the right to chose their preferred home care agency.      Nena Sharps RN, [facility]  [phone]

## 2023-11-30 NOTE — Plan of Care (Signed)
 Problem: Moderate/High Fall Risk Score >5  Goal: Patient will remain free of falls  Outcome: Progressing  Flowsheets (Taken 11/30/2023 1106)  High (Greater than 13):   HIGH-Apply yellow Fall Risk arm band   HIGH-Visual cue at entrance to patient's room   HIGH-Pharmacy to initiate evaluation and intervention per protocol  VH High Risk (Greater than 13):   Use assistive devices   Use chair-pad alarm device     Problem: Day of Admission - Stroke  Goal: Core/Quality measure requirements - Admission  Outcome: Progressing     Problem: Every Day - Stroke  Goal: Core/Quality measure requirements - Daily  Outcome: Progressing  Goal: Neurological status is stable or improving  Outcome: Progressing  Flowsheets (Taken 11/30/2023 1106)  Neurological status is stable or improving:   Re-assess NIH Stroke Scale for any change in status   Monitor/assess NIH Stroke Scale   Monitor/assess/document neurological assessment (Stroke: every 4 hours)   Observe for seizure activity and initiate seizure precautions if indicated   Perform CAM Assessment  Goal: Stable vital signs and fluid balance  Outcome: Progressing  Flowsheets (Taken 11/30/2023 1106)  Stable vital signs and fluid balance:   Monitor and assess vitals every 4 hours or as ordered and hemodynamic parameters   Encourage oral fluid intake   Apply telemetry monitor as ordered  Goal: Patient will maintain adequate oxygenation  Outcome: Progressing  Flowsheets (Taken 11/30/2023 1106)  Patient will maintain adequate oxygenation: Maintain SpO2 of greater than 92%  Goal: Patient's risk of aspiration will be minimized  Outcome: Progressing  Flowsheets (Taken 11/30/2023 1106)  Patient's risk of aspiration will be minimized: Monitor/assess for signs of aspiration (tachypnea, cough, wheezing, clearing throat, hoarseness after eating, decrease in SaO2  Goal: Nutritional intake is adequate  Outcome: Progressing  Flowsheets (Taken 11/29/2023 1110 by Yale Crank, RN)  Nutritional  intake is adequate: Consult/collaborate with Clinical Nutritionist  Goal: Elimination patterns are normal or improving  Outcome: Progressing  Goal: Mobility/Activity is maintained at optimal level for patient  Outcome: Progressing  Goal: Skin integrity is maintained or improved  Outcome: Progressing  Goal: Neurovascular status is stable or improving  Outcome: Progressing  Flowsheets (Taken 11/29/2023 1110 by Yale Crank, RN)  Neurovascular status is stable or improving: Monitor/assess neurovascular status (pulses, capillary refill, pain, paresthesia, presence of edema)  Goal: Effective coping demonstrated  Outcome: Progressing  Goal: Will be able to express needs and understand communication  Outcome: Progressing     Problem: Compromised Sensory Perception  Goal: Sensory Perception Interventions  Outcome: Progressing     Problem: Compromised Moisture  Goal: Moisture level Interventions  Outcome: Progressing     Problem: Compromised Activity/Mobility  Goal: Activity/Mobility Interventions  Outcome: Progressing     Problem: Compromised Nutrition  Goal: Nutrition Interventions  Outcome: Progressing     Problem: Compromised Friction/Shear  Goal: Friction and Shear Interventions  Outcome: Progressing     Problem: Day of Discharge - Stroke  Goal: Able to express understanding of discharge instructions and stroke education  Outcome: Progressing  Goal: Core/Quality measures - Discharge  Outcome: Progressing     Problem: Safety  Goal: Patient will be free from injury during hospitalization  Outcome: Progressing  Goal: Patient will be free from infection during hospitalization  Outcome: Progressing

## 2023-11-30 NOTE — Progress Notes (Signed)
 1202 - [facility] order placed for HH PT, OT. Patient is resident at ALF; requested [facility] coordinate with ALF team. [facility] liaison notified by secure chat.     Rosaline Rubens, BSN RN  Case Manager  [facility]

## 2023-11-30 NOTE — Discharge Summary (Addendum)
 [facility]  Internal Medicine Hospitalists  Discharge Summary          Date of Admission: 11/26/2023  Date of Discharge: 11/30/2023    Discharge Diagnoses:   Principal problem--: Altered mental status secondary to acute metabolic encephalopathy from medication effect/scopolamine  patch  #Dysarthria and facial droop on admission appearing from acute metabolic encephalopathy from medication effect/scopolamine  patch  #Chronic kidney disease stage III  #Hyponatremia  #Hyperkalemia  #Macrocytic anemia  #Orthostatic hypotension with history of hypertension  #CAD  #GERD         Hospital Course:      Reason for Admission:  See H&P for full details regarding this patient's admission.  Briefly, the patient is a 83 y.o. female with a past medical history including diabetes, GERD, hyperlipidemia, hypertension, orthostatic hypotension, insomnia, coronary artery disease CABG x 4, hyperlipidemia,  CKD, fracture of medial wall of orbit June 2025, frequent falls.  Patient was recently hospitalized after fall orbital fracture and was discharged to rehab facility.  Patient returned to the hospital after she was noted with altered mental status.       Hospital Course: Patient's altered mental status appeared to be secondary to medication effect from scopolamine  patch which was recently prescribed.  Patient was taken off of scopolamine  patch and had improvement in symptoms.  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 Consider inpatient vascular evaluation given chronically occluded R ICA if MRI shows signs of stroke or concern for TIA  - The patient was seen by neurology in hospital. Patient's altered mental status appeared to be secondary to medication effect from scopolamine  patch which was recently prescribed.  Patient was taken off of scopolamine  patch and had improvement/resolution of symptoms.     # Chronic kidney disease stage III--suspect baseline creatinine 1.3-1.5; suspect no AKI in hospital  # Hyponatremia--resolved and low sodium of 130 on admission 7/27  # Hyperkalemia--resolved and high potassium of 5.4 on admission on 7/27  - The patient was notably stable renal function in hospital as well as improved electrolytes     #Excessive sleepiness--suspected component from acute metabolic encephalopathy secondary to medication/scopolamine  patch  #Frequent falls  #Recent R orbital fracture 10/2023  - The patient was taken off of scopolamine  patch with improvement in symptoms.  Of note, she did additionally have significant orthostatic hypotension noted while working with physical therapy on 7/30.  Orthostatic hypotension did improve with the addition of TED hose stockings and abdominal binder.     # Macrocytic anemia chronic  -Noted 10/15/2023 with iron sat 47, ferritin 44, normal folate level, normal B12 level 500  -Hemoglobin stable in hospital      # Hypertensive urgency on admission in setting of known hypertension but also history of orthostatic hypotension  #CAD -CABG (2002), stents (2006)   #Cardiomegaly (7/27 CXR with mild pulm edema)  #HLD  - has labile BP per family  -  CXR - cardiomegaly, mild pulmonary edema  - Echo-( 10/15/2023) - EF 69%, mild concentric left ventricular remodeling, left ventricular diastolic function indeterminate, mild aortic valve sclerosis  - Pre-admission, the patient  was on prn Candesartan  and uses PRN midodrine  if low BP and is to continue such on discharge.  - continued statin, ASA  - continued Ranexa       #Orthostatic hypotension  - Patient was noted with significant orthostatic hypotension while working with physical therapy on 7/30.  Orthostatic hypotension did improve with the addition of TED hose stockings and abdominal binder including with standing SBP in 120's on day of discharge.  - The patient is to continue PRN midodrine  on discharge.     # Type 2 DM  - Continued insulin  sliding scale with fair glucose control in hospital     # GERD   - continued PPI daily       Discharge Day Exam:   Temp:  [97.3 F (36.3 C)-98.8 F (37.1 C)] 97.4 F (36.3 C)  Heart Rate:  [53-80] 76  Resp Rate:  [16-20] 20  BP: (122-172)/(64-79) 122/72  General: Alert and awake, no distress  CVS: Normal S1 and S2, gallop or rub  Lungs: Clear to auscultation bilaterally, no rhonchi or wheezing  Abdomen: Soft, nondistended  Extremities: No pedal edema      Pertinent Labs:     Recent Labs   Lab 11/30/23  0537 11/29/23  0458 11/27/23  0621   WBC 5.26 5.58 5.15   Hemoglobin 10.0* 9.7* 9.0*   Hematocrit 29.3* 29.1* 26.6*   Platelet Count 184 192 188     BMP:   Recent Labs   Lab 11/30/23  0537 11/29/23  0458 11/27/23  0621   Sodium 140 138 138   Potassium 4.5 4.8 4.6   Chloride 108 107 106   CO2 25 24 22    BUN 21 19 20    Creatinine 1.4* 1.4* 1.4*   Calcium  9.7 9.8 9.6   Glucose 125* 93 97     LFT:   Recent Labs   Lab 11/26/23  1232   Albumin 3.2*   Protein, Total 6.4   Bilirubin, Total 0.3   Alkaline Phosphatase 83   ALT 32   AST (SGOT) 25             Radiology and Procedures:   Radiology: all results from this admission  MR Angiogram Head WO Contrast  Result Date: 11/26/2023  1.Motion degraded exam. 2.Occlusion of the right internal carotid artery, with reconstitution of the carotid terminus, from collaterals. 3.Motion artifact limits evaluation of stenosis in the proximal left internal carotid artery.  The more distal cervical left internal carotid artery is patent. 4.Motion artifact limits evaluation of the intracranial arteries. The left anterior cerebral artery is not definitively seen, likely due to artifacts. 5.The vertebrobasilar arterial system is patent. Oakley ONEIDA Miyamoto, MD 11/26/2023 5:38 PM    MR Angiogram Neck WO Contrast  Result Date: 11/26/2023  1.Motion degraded exam. 2.Occlusion of the right internal carotid artery, with reconstitution of the carotid terminus, from collaterals. 3.Motion artifact limits evaluation of stenosis in the proximal left internal carotid artery. The more distal cervical left internal carotid artery is patent. 4.Motion artifact limits evaluation of the intracranial arteries. The left anterior cerebral artery is not definitively seen, likely due to artifacts. 5.The vertebrobasilar arterial system is patent. Oakley ONEIDA Miyamoto, MD 11/26/2023 5:38 PM    MRI Brain WO Contrast  Result Date: 11/26/2023   1.No  acute intracranial abnormality. 2.Mild volume loss and mild chronic microangiopathic ischemic changes. Chronic right parietal infarct, unchanged. 3.Complete opacification of right maxillary sinus. Oakley ONEIDA Miyamoto, MD 11/26/2023 5:32 PM    Chest  AP Portable  Result Date: 11/26/2023   Cardiomegaly with mild pulmonary edema pattern. [name], MD 11/26/2023 1:42 PM    CTA  Head & Neck  Result Date: 11/26/2023  1.No perfusion deficit suggestive of ischemic penumbra or infarct core. 2.Mildly delayed transit right cerebral hemisphere secondary to chronically occluded right ICA.  3.There is 25% stenosis of the proximal left internal carotid artery based on NASCET criteria. 4.The vertebrobasilar arterial system is patent. 5.Intracranial atherosclerosis without intracranial large vessel occlusion. [name], MD 11/26/2023 12:43 PM    CT Perfusion Brain  Result Date: 11/26/2023  1.No perfusion deficit suggestive of ischemic penumbra or infarct core. 2.Mildly delayed transit  right cerebral hemisphere secondary to chronically occluded right ICA.  3.There is 25% stenosis of the proximal left internal carotid artery based on NASCET criteria. 4.The vertebrobasilar arterial system is patent. 5.Intracranial atherosclerosis without intracranial large vessel occlusion. [name], MD 11/26/2023 12:43 PM    CT Head WO Contrast  Result Date: 11/26/2023   1.No intracranial hemorrhage or acute intracranial findings. 2.Parenchymal volume loss and chronic small vessel changes. [name], MD 11/26/2023 11:58 AM    CT Head WO Contrast  Result Date: 11/14/2023   1.No acute intracranial hemorrhage or mass effect. 2.Mild chronic appearing small vessel ischemic changes. Reyes Argyle, MD 11/14/2023 3:14 PM     Discharge Medications and Documented Allergies:        Discharge Medication List        Taking      acetaminophen  325 MG tablet  Dose: 650 mg  Commonly known as: TYLENOL   Take 2 tablets (650 mg) by mouth every 6 (six) hours as needed for Pain (or headache)     aspirin  EC 81 MG EC tablet  Dose: 81 mg  Take 1 tablet (81 mg) by mouth daily     calcium  carbonate 500 MG chewable tablet  Dose: 1 tablet  Commonly known as: TUMS  Chew 1 tablet (500 mg) by mouth every 6 (six) hours as needed for Heartburn     candesartan  4 MG tablet  Dose: 4 mg  Commonly known as: ATACAND   Take 1 tablet (4 mg) by mouth once daily as needed (SBP > 160) Take 1 tablet (4mg ) once daily as needed ( SBP more than 170 )     Centrum Silver 50+Women Tabs  Dose: 1 tablet  Take 1 tablet by mouth daily     fexofenadine 180 MG tablet  Dose: 180 mg  Commonly known as: ALLEGRA  Take 1 tablet (180 mg) by mouth daily     Flonase  Sensimist 27.5 MCG/SPRAY nasal spray  Dose: 1 spray  Generic drug: fluticasone   1 spray by Nasal route daily     lidocaine  5 %  Dose: 1 patch  Commonly known as: LIDODERM   Place 1 patch onto the skin every 24 hours Remove & Discard patch within 12 hours or as directed by MD     lovastatin 40 MG tablet  Dose: 40  mg  Commonly known as: MEVACOR  Take 1 tablet (40 mg) by mouth nightly     midodrine  2.5 MG tablet  Dose: 2.5 mg  Commonly known as: PROAMATINE   Take 1 tablet (2.5 mg) by mouth once daily as needed (SBP less than 120)  ondansetron  4 MG disintegrating tablet  Dose: 4 mg  Commonly known as: ZOFRAN -ODT  Take 1 tablet (4 mg) by mouth every 6 (six) hours as needed for Nausea     pantoprazole  40 MG tablet  Dose: 40 mg  Commonly known as: PROTONIX   Take 1 tablet (40 mg) by mouth nightly     ranolazine  500 MG 12 hr tablet  Dose: 500 mg  Commonly known as: RANEXA   Take 1 tablet (500 mg) by mouth 2 (two) times daily     traMADol  50 MG tablet  Dose: 50 mg  Commonly known as: ULTRAM   Take 1 tablet (50 mg) by mouth every 8 (eight) hours as needed for Pain              Allergies[1]         Disposition:     Discharge Disposition: Home with family    Discharge Code Status: Full Code    Patient Emergency Contact:  Extended Emergency Contact Information  Primary Emergency Contact: johnson,erika  Address: [address]           [place], [place] [address] [place]  Home Phone: [phone]  Mobile Phone: [phone]  Relation: Daughter  Interpreter needed? No  Secondary Emergency Contact: Williams,Natalie   [place]  Relation: Daughter    Discharge Instructions:     Patient Instructions: (See AVS for full details)      Most Recent Diet Order:  Orders Placed This Encounter   Procedures    Adult diet Regular     Outpatient Follow-Up Plan:     Instructions for PCP:      Appointments:   Follow-up Information       Diakiwsky, Dorothyann BRAVO, NP. Schedule an appointment as soon as possible for a visit.    Specialty: Nurse Practitioner  Contact information:  [address]  [place] [address]  [phone]                             Pending Labs, Microbiology, and Pathology:  Unresulted Labs       Procedure . . . Date/Time    Aldosterone / Plasma Renin Activity Ratio [[id number]] Collected:  11/27/23 0621    Specimen: Blood, Venous Updated: 11/27/23 9286            Attestations and Signatures:     Minutes spent coordinating discharge and reviewing discharge plan: 35 minutes, updated daughter/Erica    Signed by: Dorn CHRISTELLA Kubas, MD           [1]   Allergies  Allergen Reactions    Fentanyl Anaphylaxis     Hives    Percolone [Oxycodone]      OXYCODONE allergy hives- no allergy tylenol 

## 2023-11-30 NOTE — Consults (Addendum)
 Home Health Referral      Referral from [facility] (Case Manager) for home health care upon discharge.    By [employer], the patient has the right to freely choose a home care provider.    Arrangements have been made with:    A company of the patients choosing. We have supplied the patient with a listing of providers in your area who asked to be included and participate in Medicare.   [facility], formerly  [facility], a home care agency that provides adult home care services and participates in Medicare   The preferred provider of your insurance company. Choosing a home care provider other than your insurance company's preferred provider may affect your insurance coverage.      Home Health Discharge Information    Your doctor has ordered Physical Therapy and Occupational Therapy in-home service(s) for you while you recuperate at home, to assist you in the transition from hospital to home.    The agency that you or your representative chose to provide the service:[phone]    The above services were set up by:  Nena Sharps, RN [facility] Liaison) Phone [phone]      IF YOU HAVE NOT HEARD FROM YOUR HOME YOUR HOME HEALTH AGENCY WITHIN 24-48 HOURS AFTER DISCHARGE PLEASE CALL YOUR AGENCY TO ARRANGE A TIME FOR YOUR FIRST VISIT. FOR ANY SCHEDULING CONCERNS OR QUESTIONS RELATED TO HOME HEALTH, SUCH AS TIME OR DATE PLEASE CONTACT YOUR HOME HEALTH AGENCY AT THE NUMBER LISTED ABOVE.    Additional comments:[facility] will provide Home Health Services.             [facility]     Patient Name: Vanessa Kelly, Vanessa Kelly     MRN: [id number]      CSN: [id number]         Account Information     Hosp Acct #   [id number] Patient Class   Inpatient Service  Medicine Accommodation Code  Intermediate Care      Admission Information     Admitting Physician:  Attending Physician: Aldine Prophet, MD  Elvera Dorn HERO, MD Unit  [facility] INTERMED* L&D Status      Admitting Diagnosis:  Dizziness; Facial droop; Dysarthria; Altered mental status, unspecified Room / Bed  MI621/MI621-02 L&D - Last Menstrual Cycle      Chief Complaint: Altered Mental Status     Admit Type:  Admit Date/Time:  Discharge Date/Time: Emergency  11/26/2023 / 1145   /  Length of Stay: 4 Days    L&D EDD   Estimated Date of Delivery: None noted.      Patient Information              Home Address: [address]  [place] [address] Employer:  Employer Address:       ,     Main Phone: [phone] Employer Phone:     SSN: [id number]       DOB: 01-31-1941 (83 yrs)       Sex: Female Primary Care Physician: Vanessa Kelly   Marital Status: Widowed Referring Physician:       No ref. provider found   Race: Black or African American       Ethnicity: Not of Hispanic/Latino/S*       Emergency Contacts  Name Home Phone Work Phone Mobile Phone Relationship Vanessa Kelly [phone]   [phone] Daughter No   Vanessa Kelly,Vanessa Kelly  Daughter           Guarantor Information     Guarantor Name: Vanessa Kelly, Vanessa Kelly ID: [id number]   Guarantor Relationship to Pt: Self Guarantor Type: Personal/Family   Guarantor DOB:    December 19, 1940 Billing Indicator: Nym Charged ED Admit      Guarantor Address: [address]   [place], [place] [address]          Guarantor Home Phone: [phone] Guarantor Employer:        Guarantor Work Phone:   Pensions consultant Emp Phone:                     [employer]     Insurance Name: [employer] PART A AND B Subscriber Name: Vanessa Kelly, Vanessa Kelly   Insurance Address:    [address]  [place], [place]  [address] Subscriber DOB: 14-May-1940      Subscriber ID: [id number]   Insurance Phone:   Pt Relationship to Sub:   Self   Insurance ID:   Coverage Eff From: 05/03/2003   Group Name:   Preauthorization #: NPR   Group #:   Preauthorization Days:        Secondary Insurance     Insurance Name: [employer] FOR [employer] Subscriber Name:  Vanessa Kelly   Insurance Address:    [address]  [place], [place]  [address] Subscriber DOB: 05-03-1940      Subscriber ID: [id number]   Insurance Phone: [phone] Pt Relationship to Sub:   Self   Insurance ID:   Coverage Eff From: 10/02/2020   Group Name:   Preauthorization #:     Group #:   Preauthorization Days:        [employer] Name: - Subscriber Name:     Community education officer Address:       ,   Statistician DOB:        Subscriber ID:     Press photographer:   Pt Relationship to Sub:       Insurance ID:   Coverage Eff From:     Group Name:   Preauthorization #:     Group #:   Preauthorization Days:           11/30/2023 4:03 PM        Home Health face-to-face (FTF) Encounter (Order [id number])  Consult  Date: 11/30/2023 Department: [facility] Intermediate Care Ordering/Authorizing: Elvera Dorn HERO, MD     Order Information    Order Date/Time Release Date/Time Start Date/Time End Date/Time   11/30/23 04:06 PM None 11/30/23 04:05 PM 11/30/23 04:05 PM     Order Details    Frequency Duration Priority Order Class   Once 1  occurrence Routine Hospital Performed     Standing Order Information    Remaining Occurrences Interval Last Released     0/1 Once 11/30/2023              Provider Information    Ordering User Ordering Provider Authorizing Provider   Claudene Pool, RN Duran, Dorn HERO, MD Elvera Dorn HERO, MD   Attending Provider(s) Admitting Provider PCP   Nivia Dorise PARAS, MD; Aldine Prophet, MD; Star Diamond BIRCH, MD; Elvera Dorn HERO, MD Aldine Prophet, MD Vanessa Dorothyann BRAVO, NP     Verbal Order Info    Action Created on Order Mode Entered by Responsible Provider Signed by Signed on   Ordering 11/30/23 1606 Telephone with doyal Claudene Pool, RN Elvera Dorn HERO, MD  Comments    Primary Diagnosis:  Dizziness [R42]  Facial droop [R29.810]  Dysarthria [R47.1]  Altered mental status, unspecified [R41.82]      Following Physician:  Vanessa Dorothyann BRAVO, NP  Address: [address] / [place] [address]  Phone: [phone]    Fax: [phone]                Home Health face-to-face (FTF) Encounter: Patient Communication     Not Released  Not seen         Order Questions    Question Answer   Date I saw the patient face-to-face: 11/30/2023   Evidence this patient is homebound because: F.  Deconditioned due to advance disease process requiring assistance to leave home    G.  Fall risk due to impaired coordination, gait and decreased balance    L.  Transfer/ambulation requires stand by assist and verbal cues to perform safely    N.  Impaired mobility d/t pain, arthritis, weakness that compromises patient safety   Medical conditions that necessitate Home Health care: B.  Functional impairment due to recent hospitalization/procedure/treatment    C.  Risk for complication/infection/pain requiring follow up and monitoring    D.  Chronic illness & risk for re-hospitalization due to unstable disease status    E.  Exacerbation of disease requiring follow up monitoring    F.  New diagnosis & treatment requiring follow up monitoring and management   Per clinical findings, following services are medically necessary: PT    OT   Clinical findings that support the need for Physical Therapy. PT will A.  Evaluate and treat functional impairment and improve mobility    C.  Educate on weight bearing status, stair/gait training, balance & coordination    D.  Provide services to help restore function, mobility, and releive pain    E.  Educate on functional mobility; bed, chair, sit, stand and transfer activities    F.  Perform home safety assessment & develop safe in home exercise program    G.  Implement activities to improve stance time, cadence & step length    H.  Educate on the safe use of assistive device/ durable medical equipment   OT will provide assistance with: Restorative program to improve mobility and independence    Home program to improve ability to perform ADLs    Strategies to compensate for  loss of function    Recovery and maintenance skills    Basic motor function and reasoning abilities                    Process Instructions    Please select Home Care Services medically necessary.    Based on the above findings, I certify that this patient is confined to the home and needs intermittent skilled nursing care, physical therapry and / or speech therapy or continues to need occupational therapy. The patient is under my care, and I have initiated the establishment of the plan of care. This patient will be followed by a physician who will periodically review the plan of care.    Collection Information            Consult Order Info    ID Description Priority Start Date Start Time   [id number] Home Health face-to-face (FTF) Encounter Routine 11/30/2023  4:05 PM   Provider Specialty Referred to   ______________________________________ _____________________________________         Acknowledgement Info    For At  Acknowledged By Acknowledged On   Placing Order 11/30/23 1606 Scot Franz HERO, RN 11/30/23 1606                     Verbal Order Info    Action Created on Order Mode Entered by Responsible Provider Signed by Signed on   Ordering 11/30/23 1606 Telephone with doyal Claudene Pool, RN Duran, Dorn HERO, MD             Patient Information    Patient Name  Vanessa Kelly Legal Sex  Female DOB  June 26, 1940       Reprint Order Requisition    Home Health face-to-face (FTF) Encounter (Order #[id number]) on 11/30/23       Additional Information    Associated Reports External References   Priority and Order Details [employer]         Signed by: Pool Claudene, RN  Date Time: 11/30/23 4:01 PM

## 2023-11-30 NOTE — Progress Notes (Signed)
 Case Management location: remote       11/30/23 1556   Medicare Checklist   Is this a Medicare patient? Yes   Discharge Disposition   Patient preference/choice provided? Yes   Physical Discharge Disposition Assisted Living Facility  [facility] to coordinate with ALF therapy team)   Name of Assisted Living Facility [facility]   Mode of Transportation Ambulance   Pick up time MMT ETA 1630; # for MMT [phone]; Trip# [phone]   Patient/Family/POA notified of transfer plan Yes  (Daughter notified and agreed)   Patient agreeable to discharge plan/expected d/c date? Yes   Family/POA agreeable to discharge plan/expected d/c date? Yes   Bedside nurse notified of transport plan? Yes   Outpatient Services   Home Health Home PT/OT/ST   CM Interventions   Follow up appointment scheduled?(For PNA, COPD, MI) Yes   Referral made for home health RN visit? Yes   Multidisciplinary rounds/family meeting before d/c? Yes

## 2023-11-30 NOTE — Progress Notes (Signed)
 Date Time: 11/30/23 5:18 PM  Patient Name: Vanessa Kelly  Attending Physician: Elvera Dorn HERO, MD  Patient Class: Inpatient  Hospital Day: 4            NEUROLOGY PROGRESS NOTE             Assessment/Plan   Altered mental status encephalopathy likely lingering effects, from scopolamine  patch no other clear-cut etiology trigger seen on initial testing including brain MRI chest x-ray urine analysis ---> markedly better today  continues to do well most likely all of this is due to scopolamine  patch      Recent Labs   Lab 11/26/23  1232   Cholesterol 140   Triglycerides 42   HDL 71   LDL Calculated 61     Recent Labs   Lab 11/26/23  1232   LDL Calculated 61      TSH 1.31 B12, 855 folate 14.9      Okay for discharge    Subjective:   Patient Seen and Examined.   July 31 continues to slowly get better  July 30 continues to slowly get better  Discussed with daughter who is stating that her mental status has improved     Review of Systems:    Pt too cognitively or communication impaired to participate in ROS.    Physical Exam:   BP 149/59   Pulse 69   Temp 98.4 F (36.9 C) (Oral)   Resp 20   Ht 1.702 m (5' 7.01)   Wt 79.8 kg (176 lb)   SpO2 100%   BMI 27.56 kg/m     Neuro:  Level of consciousness  Sitting edge of bed awake alert oriented x 3 speech fluent smile symmetric, much improved as compared to prior exam no nystagmus, no asterixis  Meds:      Scheduled Meds: PRN Meds:    aspirin  EC, 81 mg, Oral, Daily  enoxaparin , 40 mg, Subcutaneous, Daily  insulin  lispro, 1-3 Units, Subcutaneous, QHS  insulin  lispro, 1-5 Units, Subcutaneous, TID AC  lidocaine , 2 patch, Transdermal, Q24H  pantoprazole , 40 mg, Oral, QAM AC  ranolazine , 500 mg, Oral, BID        Continuous Infusions:   acetaminophen , 650 mg, Q6H PRN  acetaminophen , 325 mg, Q4H PRN  carboxymethylcellulose sodium, 1 drop, TID PRN  dextrose , 15 g of glucose, PRN   Or  dextrose , 12.5 g, PRN   Or  dextrose , 12.5 g, PRN   Or  glucagon  (rDNA), 1 mg,  PRN  hydrALAZINE , 25 mg, Q8H PRN  magnesium  sulfate, 1 g, PRN  melatonin, 3 mg, QHS PRN  midodrine , 2.5 mg, Q8H PRN  naloxone , 0.2 mg, PRN  potassium & sodium phosphates , 2 packet, PRN  potassium chloride , 0-60 mEq, PRN   Or  potassium chloride , 0-60 mEq, PRN   Or  potassium chloride , 10 mEq, PRN  saline, 2 spray, Q4H PRN                Labs:     Recent Labs   Lab 11/30/23  0537 11/29/23  0458 11/27/23  0621 11/26/23  1232   Glucose 125* 93 97 84   BUN 21 19 20  23*   Creatinine 1.4* 1.4* 1.4* 1.5*   Calcium  9.7 9.8 9.6 9.5   Sodium 140 138 138 130*   Potassium 4.5 4.8 4.6 5.4*   Chloride 108 107 106 101   CO2 25 24 22 24    Albumin  --   --   --  3.2*   AST (SGOT)  --   --   --  25   ALT  --   --   --  32   Bilirubin, Total  --   --   --  0.3   Alkaline Phosphatase  --   --   --  83     Recent Labs   Lab 11/30/23  0537 11/29/23  0458 11/27/23  0621   WBC 5.26 5.58 5.15   Hemoglobin 10.0* 9.7* 9.0*   Hematocrit 29.3* 29.1* 26.6*   MCV 98.0* 99.3* 97.8*   MCH 33.4 33.1 33.1   MCHC 34.1 33.3 33.8   Platelet Count 184 192 188                 Lab Results   Component Value Date    TSH 1.31 11/27/2023               .ll    No results for input(s): PTT, PT, INR in the last 72 hours.       No results found.     All brain imaging (MRI, CT) personally reviewed.    Case discussed with: Patient's daughter at the bedside      TTS--35 minutes;  involving time spent examining patient, in counseling or coordination of care, reviewing test results, and in documentation.        I personally reviewed all of the medications.  Medication list generated using all available resources.  Elder abuse (physical)  - negative  Advanced care plan - reviewed from chart or in discussion with pt or family        This note was generated by the Epic EMR system/ Dragon speech recognition and may contain inherent errors or omissions not intended by the user. Grammatical errors, random word insertions, deletions and pronoun errors  are occasional  consequences of this technology due to software limitations.   Not all errors are caught or corrected. If there are questions or concerns about the content of this note or information contained within the body of this dictation they should be addressed directly with the author for clarification.      Signed by: Reese GORMAN Erps, MD, MD  Spectralink: [id number]      Answering Service: [phone]

## 2023-11-30 NOTE — Plan of Care (Signed)
 Report given to MMMT. Discharge information/instructions conveyed to patient. Patient stated all personal belongings intact and correct. Patient left the unit via stretcher accompanied by MMMT staff. AVS faxed to ALF.

## 2023-11-30 NOTE — Progress Notes (Signed)
 11/30/23 1350 11/30/23 1353 11/30/23 1356   Vital Signs   Heart Rate 80 80 76   Heart Rate Source Monitor Monitor Monitor   Resp Rate 20 20 20    BP 156/79 143/72 122/72   BP Location Right arm Right arm Right arm   BP Method  --   --  Automatic   MAP (mmHg) 105 95 89   Patient Position Lying Sitting Standing

## 2023-11-30 NOTE — Discharge Instr - AVS First Page (Addendum)
 Reason for your Hospital Admission:  -Altered mental status appearing secondary to medication effect from scopolamine  patch    Instructions for after your discharge:  -Continue off of scopolamine  patch  - Continue lower extremity compression stockings and abdominal binder when out of bed  - Take midodrine  2.5 mg as needed if systolic blood pressure less than 120  - Can take Atacand  as needed but would only use if systolic blood pressures greater than 170 to minimize risk of low blood pressure with standing  - Follow-up with primary care physician                 Home Health Discharge Information     Your doctor has ordered Physical Therapy and Occupational Therapy in-home service(s) for you while you recuperate at home, to assist you in the transition from hospital to home.     The agency that you or your representative chose to provide the service:[facility]:[phone]     The above services were set up by:  Nena Sharps, RN [facility] Liaison) Phone [phone]        IF YOU HAVE NOT HEARD FROM YOUR HOME YOUR HOME HEALTH AGENCY WITHIN 24-48 HOURS AFTER DISCHARGE PLEASE CALL YOUR AGENCY TO ARRANGE A TIME FOR YOUR FIRST VISIT. FOR ANY SCHEDULING CONCERNS OR QUESTIONS RELATED TO HOME HEALTH, SUCH AS TIME OR DATE PLEASE CONTACT YOUR HOME HEALTH AGENCY AT THE NUMBER LISTED ABOVE.     Additional comments:[facility] will provide Home Health Services.

## 2023-11-30 NOTE — Plan of Care (Addendum)
 Rounded on patient, per Team, patient stable for discharge. Reached out to CM regarding mode of transportation. CM will keep us  in the loop. Patient aware of discharge order.  1620 Report given to nurse Donzell at [facility] up time by MMT 1630. Patient aware of pick up time.

## 2023-12-02 LAB — ALDOSTERONE / PLASMA RENIN ACTIVITY RATIO
Aldosterone: <1 ng/dL
Renin Activity: 0.65 ng/mL/h (ref 0.25–5.82)

## 2024-01-17 ENCOUNTER — Encounter (INDEPENDENT_AMBULATORY_CARE_PROVIDER_SITE_OTHER): Payer: Self-pay

## 2024-01-17 NOTE — Progress Notes (Signed)
 THIS FORM IS FOR INTERNAL USE FOR CARDIOLOGY AND IS NOT A PROGRESS NOTE    Chart Prep Check List:  Completed Process Reviewed Notes   [x]  Review last office visit note. If "orders placed, ensure that the results are in the chart.    Refresh CareEverywhere- outside results may appear here.   If not in chart:         - Call the patient and clarify if they have done testing outside of [facility].             - If the patient has completed testing outside of [facility], request that the patient bring the results to their appointment or ask for facility information so you can call and request results to be faxed to clinic. Once faxed, scanned into chart.  - If unable to obtain, call patient and request for patient to bring physical copy to [facility] or provide in MyChart message.    If the patient has not completed testing, confirm with the provider if it is okay to proceed with the scheduled appointment or if the patient needs to schedule testing and follow up afterward. Contact designated PAA to reschedule appointment and diagnostic testing.    [x]  Check to make sure all recent labs are available/completed  If PCP is within [place], will appear in Epic.   If outside of [place], call to obtain records. Put in blue sticky note labs are scanned, specify lab, and date of labs.   If no PCP in chart, call patient to confirm no recent labs from outside facility.    [x]  Pend EKG, if needed  If EKG is completed, please provide date of last EKG in blue sticky note     []  If cardiac clearance:  Add physician/surgeon to care team  Document physician, name of procedure, and fax to the right and in  blue sticky note   Check media and concord for cardiac clearance request. If in concord, upload the request to chart.         Disclaimer: If calling patient, please ensure check list is completed prior to have all information/ questions readily available.

## 2024-02-06 ENCOUNTER — Ambulatory Visit (INDEPENDENT_AMBULATORY_CARE_PROVIDER_SITE_OTHER): Admitting: Internal Medicine

## 2024-02-08 ENCOUNTER — Encounter (INDEPENDENT_AMBULATORY_CARE_PROVIDER_SITE_OTHER): Payer: Self-pay | Admitting: Internal Medicine

## 2024-02-08 ENCOUNTER — Ambulatory Visit (INDEPENDENT_AMBULATORY_CARE_PROVIDER_SITE_OTHER): Admitting: Internal Medicine

## 2024-02-08 VITALS — BP 122/72 | HR 80 | Ht 69.0 in | Wt 181.0 lb

## 2024-02-08 DIAGNOSIS — I951 Orthostatic hypotension: Secondary | ICD-10-CM

## 2024-02-08 DIAGNOSIS — I2584 Coronary atherosclerosis due to calcified coronary lesion: Secondary | ICD-10-CM

## 2024-02-08 DIAGNOSIS — I2510 Atherosclerotic heart disease of native coronary artery without angina pectoris: Secondary | ICD-10-CM

## 2024-02-08 DIAGNOSIS — E782 Mixed hyperlipidemia: Secondary | ICD-10-CM

## 2024-02-08 NOTE — Progress Notes (Signed)
 [facility] [place] OFFICE CONSULTATION    I had the pleasure of seeing [name] today  for cardiovascular evaluation. Vanessa Kelly is a pleasant 83 y.o. female with a history of CABG 3V 2002 [facility], [facility],  HTN, DM, CVA, carotid artery stenosis who presents for follow-up.  Vanessa Kelly was admitted on multiple occasions this year for fall as well as metabolic encephalopathy.  No cardiac issues.  Vanessa Kelly does complain of fatigue.  Vanessa Kelly reports having a sleep study last year and was advised to get CPAP, Vanessa Kelly declined.  Vanessa Kelly says Vanessa Kelly does not feel well and wakes up feeling tired.  No chest pain or shortness of breath.  Vanessa Kelly walks with a walker.  Vanessa Kelly denies palpitations, dizziness, syncope.  Vanessa Kelly has not needed to take midodrine , rarely has to use candesartan .  Vanessa Kelly is compliant with compression stockings.    PAST MEDICAL HISTORY: Vanessa Kelly has a past medical history of Diabetes mellitus (CMS/HCC), Gastroesophageal reflux disease, Hyperlipidemia, Hypertension, Hypotension, Malignant tumor of breast (CMS/HCC) (04/13/2016), Seasonal allergic rhinitis, and Syncope. Vanessa Kelly has a past surgical history that includes Breast biopsy (Right); Hysterectomy; and CORONARY ARTERY BYPASS.        MEDICATIONS:   Current Outpatient Medications   Medication Sig Dispense Refill    acetaminophen  (TYLENOL ) 325 MG tablet Take 2 tablets (650 mg) by mouth every 6 (six) hours as needed for Pain (or headache)      aspirin  EC 81 MG EC tablet Take 1 tablet (81 mg) by mouth daily      calcium  carbonate (TUMS) 500 MG chewable tablet Chew 1 tablet (500 mg) by mouth every 6 (six) hours as needed for Heartburn      candesartan  (ATACAND ) 4 MG tablet Take 1 tablet (4 mg) by mouth once daily as needed (SBP > 160) Take 1 tablet (4mg ) once daily as needed ( SBP more than 170 )      fexofenadine (ALLEGRA) 180 MG tablet Take 1 tablet (180 mg) by mouth daily      fluticasone  (Flonase  Sensimist) 27.5 MCG/SPRAY nasal spray 1 spray by Nasal route daily       lidocaine  (LIDODERM ) 5 % Place 1 patch onto the skin every 24 hours Remove & Discard patch within 12 hours or as directed by MD 30 patch 0    lovastatin (MEVACOR) 40 MG tablet Take 1 tablet (40 mg) by mouth nightly      midodrine  (PROAMATINE ) 2.5 MG tablet Take 1 tablet (2.5 mg) by mouth once daily as needed (SBP less than 120)      Multiple Vitamins-Minerals (Centrum Silver 50+Women) Tab Take 1 tablet by mouth daily      pantoprazole  (PROTONIX ) 40 MG tablet Take 1 tablet (40 mg) by mouth nightly      ranolazine  (RANEXA ) 500 MG 12 hr tablet Take 1 tablet (500 mg) by mouth 2 (two) times daily      traMADol  (ULTRAM ) 50 MG tablet Take 1 tablet (50 mg) by mouth every 8 (eight) hours as needed for Pain      venlafaxine  (EFFEXOR -XR) 75 MG 24 hr capsule Take 1 capsule (75 mg) by mouth once daily      ondansetron  (ZOFRAN -ODT) 4 MG disintegrating tablet Take 1 tablet (4 mg) by mouth every 6 (six) hours as needed for Nausea (Patient not taking: Reported on 02/08/2024) 8 tablet 0     No current facility-administered medications for this visit.           ALLERGIES:  Allergies   Allergen Reactions    Fentanyl Anaphylaxis     Hives    Gabapentin Dizziness    Ondansetron      Percolone [Oxycodone]      OXYCODONE allergy hives- no allergy tylenol      Scopolamine  Hallucinations         FAMILY HISTORY: Vanessa Kelly family history includes Breast cancer in Vanessa Kelly sister; Heart failure in Vanessa Kelly mother; Myocardial Infarction in Vanessa Kelly father.      SOCIAL HISTORY: Vanessa Kelly reports that Vanessa Kelly quit smoking about 35 years ago. Vanessa Kelly smoking use included cigarettes. Vanessa Kelly has been exposed to tobacco smoke. Vanessa Kelly has never used smokeless tobacco. Vanessa Kelly reports current alcohol use. Vanessa Kelly reports that Vanessa Kelly does not use drugs.      REVIEW OF SYSTEMS: All other systems reviewed and negative except as above.         PHYSICAL EXAMINATION  General Appearance:  A well-appearing female in no acute distress.    Visit Vitals  BP 122/72 (BP Site: Right arm, Patient Position: Sitting,  Cuff Size: Large)   Pulse 80   Ht 1.753 m (5' 9)   Wt 82.1 kg (181 lb)   SpO2 99%   BMI 26.73 kg/m         HEENT: Sclera anicteric, conjunctiva without pallor, moist mucous membranes, normal dentition. No arcus.   Neck:  Supple without jugular venous distention. Thyroid  nonpalpable. Normal carotid upstrokes without bruits.   Chest: Clear to auscultation bilaterally with good air movement and respiratory effort and no wheezes, rales, or rhonchi   Cardiovascular: Normal S1 and physiologically split S2 with short systolic murmur aortic area, no gallops or rub. PMI of normal size and nondisplaced.    Extremities: Warm without significant edema.  Skin: No rash, xanthoma or xanthelasma.   Neuro: Alert and oriented x3. Grossly intact. Strength is symmetrical. Normal mood and affect.           ECG 11/26/2023, I have independently reviewed the tracing, sinus rhythm, inferior MI, non specific T changes      Echocardiogram 11/27/2023    Summary    * Left ventricular systolic function is normal with an ejection fraction by  Biplane Method of Discs of  64 %.    * There is moderate concentric left ventricular hypertrophy.    * Right ventricular function is normal.    * No significant valvular dysfunction.    * IVC diameter is <= to 2.1 cm with > 50% collapse with inspiratory sniff,  consistent with estimated right atrial pressure of 3 mmHg (range 0-5).    * Compared to the prior study, there has been no significant change.    Nuclear stress test 04/20/2023    Summary    1. Study limited due to images obtained with the patient's right arm down.    2. Cannot rule out a small area of lateral wall ischemia vs artifact.    3. Gated wall motion study demonstrates normal left ventricular thickening  and wall motion with calculated ejection fraction 68 %.    4. No prior studies are available for comparison.    Echocardiogram 12/05/2022    Summary    * Normal left ventricular size and systolic function, estimated EF 65 to  70%.    *  Concentric left ventricular remodeling.    * Aortic sclerosis (peak velocity 2.2 m/s), with trace regurgitation.    * Technically limited bubble study appears minimally positive.    * Compared to the prior study  from 12/01/2020, no significant change.    Echocardiogram 12/01/2020         Summary    * Left ventricular ejection fraction is normal with an estimated ejection  fraction of 65-70%.    * There is mild concentric left ventricular hypertrophy.    * Left ventricular diastolic filling parameters demonstrate normal diastolic  function.    * Normal right ventricular systolic function.    * There is trace aortic regurgitation.    * No pulmonary hypertension with estimated right ventricular systolic  pressure of  23 mmHg.    * The IVC is normal in size with > 50% respiratory variance consistent with  normal RA pressure of 3 mmHg.    PET myocardial perfusion imaging 01/27/2020 - Equivocal. Small sized area of ischemia in the apical wall. EF 85%, no RWMA    Carotid artery Dopplers 05/11/2020      Findings:  The right and left side common carotid arteries, bifurcations, carotid bulbs as well as the internal and external carotid arteries show no evidence of obstruction or significant stenosis. Peak systolic flow velocities are within the normal range with normal-appearing Doppler waveforms.     Right common artery: Peak systolic velocity 184 cm/s     Right internal carotid artery: Peak systolic velocity 142 cm/s     Left common carotid artery: Peak systolic velocity 144 cm/s     Left internal carotid artery: Peak systolic velocity 165 cm/s     The right ICA/CCA ratio is 0.9     The left ICA/CCA ratio is 1.3   Atherosclerotic plaque is seen of the carotid bulbs bilaterally.   Vertebral artery flow is antegrade bilaterally       IMPRESSION:   1. Atherosclerotic disease, bilateral 50-69% stenosis internal carotid arteries.        LABS:   Lab Results   Component Value Date    WBC 5.26 11/30/2023    HGB 10.0 (L) 11/30/2023    HCT  29.3 (L) 11/30/2023    PLT 184 11/30/2023    NA 140 11/30/2023    K 4.5 11/30/2023    MG 2.0 10/15/2023    BUN 21 11/30/2023    CREAT 1.4 (H) 11/30/2023    GLU 125 (H) 11/30/2023    AST 25 11/26/2023    ALT 32 11/26/2023    HGBA1C 5.7 (H) 10/15/2023    TSH 1.31 11/27/2023    TROPI 11.3 11/26/2023    DDIMER 0.83 (H) 01/21/2021    BNP 71 01/21/2021     Recent Labs     11/26/23  1232 06/07/23  1002 12/05/22  0601 06/24/22  0620   Cholesterol 140 162 159 163   Triglycerides 42 52 69 39   HDL 71 80 75 74   LDL Calculated 61 72 70 81         IMPRESSION/RECOMMENDATIONS:     Orthostatic hypotension -stable, remains compliant with compression stockings.  Vanessa Kelly has not needed to take midodrine .  Continue to monitor.  Keep well-hydrated.    CAD status post CABG 2002, status post stents 2006 -stable, asymptomatic.  Vanessa Kelly reports cardiac catheterization in 2019/ 2020 at [facility], Vanessa Kelly was to have a stent at that time however the stent could not be placed.  Cardiac cath report requested multiple times but not obtained. Mild apical ischemia on PET myocardial fusion scan 01/2020.  Repeat nuclear stress test 04/20/2023, technically limited study, cannot exclude small area of lateral wall ischemia versus artifact.  EF 60%.  No chest pain or shortness of breath.  Continue with aspirin  81 mg p.o. daily, Ranexa  500 mg p.o. twice daily, lovastatin 40 mg p.o. daily.  LDL is at goal 61.  Normal AST and ALT 25 and 32.    Hypertension  -not currently on medications.  Vanessa Kelly has not needed to take candesartan  which Vanessa Kelly takes only as needed for elevated readings.    Prior history of CVA -continue with aspirin  and statin.      Diabetes -Per endocrinology/PCP.  Most recent hemoglobin A1c 5.7    Carotid artery disease -seen previously by vascular surgery, Dr. Sheppard, Vanessa Kelly has not yet scheduled follow-up with vascular surgery.  Complete occlusion of right carotid bulb and right internal carotid artery on MRA neck as above.  Continue with aspirin  and  lovastatin.    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     Depression.    Chronic Anemia - Hb 10.0 recommend evaluation by PCP.     CKD -creatinine 1.4, follow-up with PCP.    OSA - had sleep study 2024 CPAP recommended but Vanessa Kelly declined. Given fatigue, recommend follow up with Sleep physicians.  Vanessa Kelly agrees to get records from Vanessa Kelly sleep physicians.    Follow up in 6 months.     Jodee LOISE Peyer, MD  02/08/2024, 11:55 AM  [facility]  [phone]

## 2024-05-14 ENCOUNTER — Other Ambulatory Visit: Payer: Self-pay

## 2024-05-14 DIAGNOSIS — R1084 Generalized abdominal pain: Secondary | ICD-10-CM

## 2024-05-16 ENCOUNTER — Ambulatory Visit

## 2024-05-21 ENCOUNTER — Ambulatory Visit

## 2024-05-21 ENCOUNTER — Ambulatory Visit
Admission: RE | Admit: 2024-05-21 | Discharge: 2024-05-21 | Disposition: A | Discharge status: Home / Self Care | Source: Ambulatory Visit | Attending: Family | Admitting: Family

## 2024-05-21 ENCOUNTER — Other Ambulatory Visit: Payer: Self-pay | Admitting: Family

## 2024-05-21 DIAGNOSIS — R1084 Generalized abdominal pain: Secondary | ICD-10-CM

## 2024-05-21 LAB — BASIC METABOLIC PANEL
Anion Gap: 7 (ref 5.0–15.0)
BUN: 22 mg/dL — ABNORMAL HIGH (ref 7–21)
CO2: 25 meq/L (ref 17–29)
Calcium: 10.2 mg/dL (ref 7.9–10.2)
Chloride: 107 meq/L (ref 99–111)
Creatinine: 1.5 mg/dL — ABNORMAL HIGH (ref 0.4–1.0)
GFR: 33.5 mL/min/1.73 m2 — ABNORMAL LOW (ref >=60.0)
Glucose: 162 mg/dL — ABNORMAL HIGH (ref 70–100)
Hemolysis Index: 7 {index}
Potassium: 5.2 meq/L (ref 3.5–5.3)
Sodium: 139 meq/L (ref 135–145)

## 2024-05-21 LAB — HEPATIC FUNCTION PANEL (LFT)
ALT: 23 U/L (ref <=55)
AST (SGOT): 22 U/L (ref <=41)
Albumin/Globulin Ratio: 1.1 (ref 0.9–2.2)
Albumin: 3.7 g/dL (ref 3.5–4.9)
Alkaline Phosphatase: 91 U/L (ref 37–117)
Bilirubin Direct: 0.2 mg/dL (ref 0.0–0.5)
Bilirubin Indirect: 0.2 mg/dL (ref 0.2–1.0)
Bilirubin, Total: 0.4 mg/dL (ref 0.2–1.2)
Globulin: 3.4 g/dL (ref 2.0–3.6)
Hemolysis Index: 7 {index}
Protein, Total: 7.1 g/dL (ref 6.0–8.3)

## 2024-05-21 LAB — LIPASE: Lipase: 12 U/L (ref 8–78)

## 2024-05-22 ENCOUNTER — Ambulatory Visit

## 2024-05-23 ENCOUNTER — Ambulatory Visit

## 2024-08-06 ENCOUNTER — Ambulatory Visit (INDEPENDENT_AMBULATORY_CARE_PROVIDER_SITE_OTHER): Admitting: Internal Medicine

## 2024-08-22 ENCOUNTER — Ambulatory Visit (INDEPENDENT_AMBULATORY_CARE_PROVIDER_SITE_OTHER): Admitting: Internal Medicine
